# Patient Record
Sex: Female | Born: 1970 | Race: White | Hispanic: No | Smoking: Former smoker
Health system: Southern US, Community
[De-identification: ages and names within clinical notes are randomized; demographics above are authoritative.]

## PROBLEM LIST (undated history)

## (undated) DIAGNOSIS — M545 Low back pain, unspecified: Secondary | ICD-10-CM

## (undated) DIAGNOSIS — F410 Panic disorder [episodic paroxysmal anxiety] without agoraphobia: Secondary | ICD-10-CM

## (undated) DIAGNOSIS — R3589 Other polyuria: Secondary | ICD-10-CM

## (undated) DIAGNOSIS — R519 Headache, unspecified: Secondary | ICD-10-CM

## (undated) DIAGNOSIS — M25569 Pain in unspecified knee: Secondary | ICD-10-CM

## (undated) DIAGNOSIS — G4730 Sleep apnea, unspecified: Secondary | ICD-10-CM

## (undated) DIAGNOSIS — I10 Essential (primary) hypertension: Secondary | ICD-10-CM

## (undated) DIAGNOSIS — G8929 Other chronic pain: Secondary | ICD-10-CM

## (undated) DIAGNOSIS — R262 Difficulty in walking, not elsewhere classified: Secondary | ICD-10-CM

## (undated) DIAGNOSIS — R2689 Other abnormalities of gait and mobility: Secondary | ICD-10-CM

## (undated) DIAGNOSIS — K219 Gastro-esophageal reflux disease without esophagitis: Secondary | ICD-10-CM

## (undated) DIAGNOSIS — K5900 Constipation, unspecified: Secondary | ICD-10-CM

## (undated) DIAGNOSIS — R569 Unspecified convulsions: Secondary | ICD-10-CM

## (undated) DIAGNOSIS — F419 Anxiety disorder, unspecified: Secondary | ICD-10-CM

## (undated) DIAGNOSIS — R358 Other polyuria: Secondary | ICD-10-CM

## (undated) DIAGNOSIS — R109 Unspecified abdominal pain: Secondary | ICD-10-CM

## (undated) DIAGNOSIS — M1990 Unspecified osteoarthritis, unspecified site: Secondary | ICD-10-CM

## (undated) DIAGNOSIS — R51 Headache: Secondary | ICD-10-CM

## (undated) HISTORY — DX: Unspecified convulsions: R56.9

## (undated) HISTORY — PX: BACK SURGERY: SHX140

## (undated) HISTORY — DX: Anxiety disorder, unspecified: F41.9

## (undated) HISTORY — DX: Essential (primary) hypertension: I10

## (undated) HISTORY — DX: Panic disorder (episodic paroxysmal anxiety): F41.0

## (undated) HISTORY — DX: Other polyuria: R35.8

## (undated) HISTORY — DX: Sleep apnea, unspecified: G47.30

## (undated) HISTORY — DX: Other polyuria: R35.89

---

## 2000-09-25 ENCOUNTER — Emergency Department (HOSPITAL_COMMUNITY): Admission: EM | Admit: 2000-09-25 | Discharge: 2000-09-25 | Payer: Self-pay | Admitting: Emergency Medicine

## 2000-09-25 ENCOUNTER — Encounter: Payer: Self-pay | Admitting: Emergency Medicine

## 2000-11-09 ENCOUNTER — Emergency Department (HOSPITAL_COMMUNITY): Admission: EM | Admit: 2000-11-09 | Discharge: 2000-11-09 | Payer: Self-pay | Admitting: Internal Medicine

## 2000-11-09 ENCOUNTER — Encounter: Payer: Self-pay | Admitting: *Deleted

## 2000-11-11 ENCOUNTER — Encounter: Payer: Self-pay | Admitting: Internal Medicine

## 2000-11-11 ENCOUNTER — Emergency Department (HOSPITAL_COMMUNITY): Admission: EM | Admit: 2000-11-11 | Discharge: 2000-11-12 | Payer: Self-pay | Admitting: Internal Medicine

## 2002-01-23 ENCOUNTER — Emergency Department (HOSPITAL_COMMUNITY): Admission: EM | Admit: 2002-01-23 | Discharge: 2002-01-23 | Payer: Self-pay | Admitting: Emergency Medicine

## 2003-10-31 ENCOUNTER — Emergency Department (HOSPITAL_COMMUNITY): Admission: EM | Admit: 2003-10-31 | Discharge: 2003-10-31 | Payer: Self-pay | Admitting: Emergency Medicine

## 2003-11-22 ENCOUNTER — Emergency Department (HOSPITAL_COMMUNITY): Admission: EM | Admit: 2003-11-22 | Discharge: 2003-11-22 | Payer: Self-pay | Admitting: Emergency Medicine

## 2003-12-19 ENCOUNTER — Ambulatory Visit (HOSPITAL_COMMUNITY): Admission: RE | Admit: 2003-12-19 | Discharge: 2003-12-19 | Payer: Self-pay | Admitting: Pulmonary Disease

## 2004-11-24 ENCOUNTER — Emergency Department (HOSPITAL_COMMUNITY): Admission: EM | Admit: 2004-11-24 | Discharge: 2004-11-24 | Payer: Self-pay | Admitting: Emergency Medicine

## 2005-07-19 ENCOUNTER — Encounter (HOSPITAL_COMMUNITY): Admission: RE | Admit: 2005-07-19 | Discharge: 2005-08-18 | Payer: Self-pay | Admitting: Preventative Medicine

## 2005-12-26 ENCOUNTER — Emergency Department (HOSPITAL_COMMUNITY): Admission: EM | Admit: 2005-12-26 | Discharge: 2005-12-26 | Payer: Self-pay | Admitting: Emergency Medicine

## 2006-01-13 ENCOUNTER — Encounter: Admission: RE | Admit: 2006-01-13 | Discharge: 2006-01-13 | Payer: Self-pay | Admitting: Orthopedic Surgery

## 2006-04-19 ENCOUNTER — Encounter: Admission: RE | Admit: 2006-04-19 | Discharge: 2006-04-19 | Payer: Self-pay | Admitting: Orthopedic Surgery

## 2006-05-23 ENCOUNTER — Encounter (INDEPENDENT_AMBULATORY_CARE_PROVIDER_SITE_OTHER): Payer: Self-pay | Admitting: *Deleted

## 2006-05-23 ENCOUNTER — Ambulatory Visit: Payer: Self-pay | Admitting: *Deleted

## 2006-05-23 ENCOUNTER — Inpatient Hospital Stay (HOSPITAL_COMMUNITY): Admission: RE | Admit: 2006-05-23 | Discharge: 2006-05-26 | Payer: Self-pay | Admitting: Orthopedic Surgery

## 2006-06-02 ENCOUNTER — Inpatient Hospital Stay (HOSPITAL_COMMUNITY): Admission: EM | Admit: 2006-06-02 | Discharge: 2006-06-07 | Payer: Self-pay | Admitting: Emergency Medicine

## 2006-07-30 ENCOUNTER — Emergency Department (HOSPITAL_COMMUNITY): Admission: EM | Admit: 2006-07-30 | Discharge: 2006-07-30 | Payer: Self-pay | Admitting: Emergency Medicine

## 2006-08-22 ENCOUNTER — Encounter (HOSPITAL_COMMUNITY): Admission: RE | Admit: 2006-08-22 | Discharge: 2006-09-21 | Payer: Self-pay | Admitting: Orthopedic Surgery

## 2006-09-26 ENCOUNTER — Encounter (HOSPITAL_COMMUNITY): Admission: RE | Admit: 2006-09-26 | Discharge: 2006-10-26 | Payer: Self-pay | Admitting: Orthopedic Surgery

## 2007-02-02 ENCOUNTER — Other Ambulatory Visit: Admission: RE | Admit: 2007-02-02 | Discharge: 2007-02-02 | Payer: Self-pay | Admitting: Obstetrics and Gynecology

## 2007-11-08 ENCOUNTER — Emergency Department (HOSPITAL_COMMUNITY): Admission: EM | Admit: 2007-11-08 | Discharge: 2007-11-08 | Payer: Self-pay | Admitting: Emergency Medicine

## 2008-02-26 ENCOUNTER — Emergency Department (HOSPITAL_COMMUNITY): Admission: EM | Admit: 2008-02-26 | Discharge: 2008-02-26 | Payer: Self-pay | Admitting: Emergency Medicine

## 2008-06-18 ENCOUNTER — Emergency Department (HOSPITAL_COMMUNITY): Admission: EM | Admit: 2008-06-18 | Discharge: 2008-06-18 | Payer: Self-pay | Admitting: Emergency Medicine

## 2008-11-05 ENCOUNTER — Ambulatory Visit (HOSPITAL_COMMUNITY): Admission: RE | Admit: 2008-11-05 | Discharge: 2008-11-05 | Payer: Self-pay | Admitting: Pulmonary Disease

## 2009-02-16 ENCOUNTER — Emergency Department (HOSPITAL_COMMUNITY): Admission: EM | Admit: 2009-02-16 | Discharge: 2009-02-16 | Payer: Self-pay | Admitting: Emergency Medicine

## 2009-07-29 ENCOUNTER — Emergency Department (HOSPITAL_COMMUNITY): Admission: EM | Admit: 2009-07-29 | Discharge: 2009-07-29 | Payer: Self-pay | Admitting: Emergency Medicine

## 2009-11-19 ENCOUNTER — Ambulatory Visit (HOSPITAL_COMMUNITY): Admission: RE | Admit: 2009-11-19 | Discharge: 2009-11-19 | Payer: Self-pay | Admitting: Pulmonary Disease

## 2009-11-25 ENCOUNTER — Ambulatory Visit: Admission: RE | Admit: 2009-11-25 | Discharge: 2009-11-25 | Payer: Self-pay | Admitting: Pulmonary Disease

## 2010-04-25 ENCOUNTER — Encounter: Payer: Self-pay | Admitting: Orthopedic Surgery

## 2010-05-27 ENCOUNTER — Emergency Department (HOSPITAL_COMMUNITY)
Admission: EM | Admit: 2010-05-27 | Discharge: 2010-05-27 | Disposition: A | Discharge status: Home / Self Care | Payer: Medicare Other | Attending: Emergency Medicine | Admitting: Emergency Medicine

## 2010-05-27 ENCOUNTER — Emergency Department (HOSPITAL_COMMUNITY): Payer: Medicare Other

## 2010-05-27 DIAGNOSIS — J40 Bronchitis, not specified as acute or chronic: Secondary | ICD-10-CM | POA: Insufficient documentation

## 2010-05-27 DIAGNOSIS — R059 Cough, unspecified: Secondary | ICD-10-CM | POA: Insufficient documentation

## 2010-05-27 DIAGNOSIS — R05 Cough: Secondary | ICD-10-CM | POA: Insufficient documentation

## 2010-05-27 DIAGNOSIS — M549 Dorsalgia, unspecified: Secondary | ICD-10-CM | POA: Insufficient documentation

## 2010-06-22 LAB — URINALYSIS, ROUTINE W REFLEX MICROSCOPIC
Bilirubin Urine: NEGATIVE
Glucose, UA: NEGATIVE mg/dL
Hgb urine dipstick: NEGATIVE
Protein, ur: NEGATIVE mg/dL
Specific Gravity, Urine: 1.02 (ref 1.005–1.030)
Urobilinogen, UA: 0.2 mg/dL (ref 0.0–1.0)

## 2010-06-22 LAB — DIFFERENTIAL
Basophils Absolute: 0 10*3/uL (ref 0.0–0.1)
Lymphocytes Relative: 18 % (ref 12–46)
Lymphs Abs: 1.8 10*3/uL (ref 0.7–4.0)
Neutro Abs: 7.4 10*3/uL (ref 1.7–7.7)
Neutrophils Relative %: 75 % (ref 43–77)

## 2010-06-22 LAB — COMPREHENSIVE METABOLIC PANEL WITH GFR
ALT: 23 U/L (ref 0–35)
AST: 21 U/L (ref 0–37)
Albumin: 3.8 g/dL (ref 3.5–5.2)
Alkaline Phosphatase: 67 U/L (ref 39–117)
BUN: 8 mg/dL (ref 6–23)
CO2: 23 meq/L (ref 19–32)
Calcium: 9.4 mg/dL (ref 8.4–10.5)
Chloride: 105 meq/L (ref 96–112)
Creatinine, Ser: 0.68 mg/dL (ref 0.4–1.2)
GFR calc Af Amer: >60 mL/min (ref >60)
GFR calc non Af Amer: >60 mL/min (ref >60)
Glucose, Bld: 98 mg/dL (ref 70–99)
Potassium: 3.4 meq/L — ABNORMAL LOW (ref 3.5–5.1)
Sodium: 136 meq/L (ref 135–145)
Total Bilirubin: 0.3 mg/dL (ref 0.3–1.2)
Total Protein: 7.2 g/dL (ref 6.0–8.3)

## 2010-06-22 LAB — CBC
HCT: 35.7 % — ABNORMAL LOW (ref 36.0–46.0)
Hemoglobin: 13.2 g/dL (ref 12.0–15.0)
MCHC: 37 g/dL — ABNORMAL HIGH (ref 30.0–36.0)
MCV: 88 fL (ref 78.0–100.0)
Platelets: 217 K/uL (ref 150–400)
RBC: 4.05 MIL/uL (ref 3.87–5.11)
RDW: 12.5 % (ref 11.5–15.5)
WBC: 9.9 K/uL (ref 4.0–10.5)

## 2010-06-22 LAB — LIPASE, BLOOD: Lipase: 33 U/L (ref 11–59)

## 2010-07-07 LAB — DIFFERENTIAL
Basophils Absolute: 0 10*3/uL (ref 0.0–0.1)
Basophils Relative: 1 % (ref 0–1)
Eosinophils Absolute: 0.1 10*3/uL (ref 0.0–0.7)
Eosinophils Relative: 1 % (ref 0–5)
Lymphocytes Relative: 32 % (ref 12–46)
Lymphs Abs: 1.8 10*3/uL (ref 0.7–4.0)
Monocytes Absolute: 0.4 10*3/uL (ref 0.1–1.0)
Monocytes Relative: 6 % (ref 3–12)
Neutro Abs: 3.4 10*3/uL (ref 1.7–7.7)
Neutrophils Relative %: 60 % (ref 43–77)

## 2010-07-07 LAB — CK TOTAL AND CKMB (NOT AT ARMC)
CK, MB: 0.8 ng/mL (ref 0.3–4.0)
Relative Index: INVALID (ref 0.0–2.5)
Total CK: 42 U/L (ref 7–177)

## 2010-07-07 LAB — COMPREHENSIVE METABOLIC PANEL
AST: 19 U/L (ref 0–37)
Albumin: 3.9 g/dL (ref 3.5–5.2)
Alkaline Phosphatase: 76 U/L (ref 39–117)
BUN: 9 mg/dL (ref 6–23)
Chloride: 101 mEq/L (ref 96–112)
GFR calc Af Amer: >60 mL/min (ref >60)
Potassium: 4.1 mEq/L (ref 3.5–5.1)
Sodium: 138 mEq/L (ref 135–145)
Total Protein: 7.8 g/dL (ref 6.0–8.3)

## 2010-07-07 LAB — CBC
HCT: 42.2 % (ref 36.0–46.0)
Hemoglobin: 14.9 g/dL (ref 12.0–15.0)
RBC: 4.74 MIL/uL (ref 3.87–5.11)
RDW: 12.9 % (ref 11.5–15.5)
WBC: 5.7 10*3/uL (ref 4.0–10.5)

## 2010-08-20 NOTE — Consult Note (Signed)
[name], [name]            ACCOUNT NO.:  [id number]   MEDICAL RECORD NO.:  [id number]          PATIENT TYPE:  INP   LOCATION:  5014                         FACILITY:  [facility]   PHYSICIAN:  Courtney Paris, M.D.DATE OF BIRTH:  March 06, 1971   DATE OF CONSULTATION:  06/02/2006  DATE OF DISCHARGE:                                 CONSULTATION   REQUESTING PHYSICIAN:  Dr. Liliane Barr   REASON FOR CONSULTATION:  Large left renal urinoma.   BRIEF HISTORY:  This 40 year old single white female had an artificial  disc placed about ten days ago by Dr. Alveda Reasons apparently without  complication, this was for a radicular pain down her right leg.  Surgery  went well, she went home on or about the third postoperative day.  Beginning about three days ago, she started having some increasing pain  in her left side radiating down her left leg and her abdomen became more  and more distended.  She saw Dr. Alveda Reasons yesterday in the office and he  admitted her to the hospital for further treatment and care.  A CT scan  done today showed a large left urinoma of the left collecting system,  there was very minimal hydronephrosis, it looks like a ureteral leak  near the ureteral pelvic junction that was tracking down the  retroperitoneum.  There was a massive amount of fluid in the  retroperitoneum, but the kidney itself was very minimally if any  dilated.  This was about L2 where the disc was two levels lower.   She had never had a previous urinary problem before, she does have  urinary frequency, has had very rare urinary infections in the past.  She did have a little bit of blood in her urine when she left the  hospital last week and is on Coumadin.  Her other medicines include  Darvocet for pain, Dilantin for seizures 200 mg b.i.d. and Phenergan for  nausea.  She has not had any fever, chills, but has had some mild  nausea, but no vomiting.   PAST MEDICAL HISTORY:  She has had seizures since she was  little, but on  the Dilantin has done well, never had kidney stones or urinary problems,  does not have stress incontinence.  She has been voiding large amounts  of clear urine.  The 12-point review is otherwise negative, she does not  smoke or drink alcohol, no cardiac or pulmonary symptomatology, bowels  have been somewhat constipated.   FAMILY HISTORY/SOCIAL HISTORY/REVIEW OF SYSTEMS:  Negative except as  noted.  She lives with her significant other in [place], her father  lives in [place], has had throat cancer, but is doing well, her  mother lives in [place], she has one other sister in good health.  No  other family history of renal disease, kidney stones or hematuria.   VITAL SIGNS:  Blood pressure is 138/88, pulse 110, respirations 16,  temperature 98.2.  GENERAL:  She is a pleasant, short-haired white female in no acute  distress.  She works for [employer].  HEENT:  Clear.  NECK:  Supple. CHEST: Clear, no  mumurs  ABDOMEN:  Soft with a small transverse incision in her left lower  quadrant.  She does have some abdominal distension and some mild left  CVA tenderness.  PELVIC:  Deferred.  EXTREMITIES:  Negative, no edema, good distal pulses, intact sensation  to light touch.  NEURO: Intact CNS, Oriented x 3  Her laboratory values were all normal.   IMPRESSION:  1. Large left urinoma.  2. Left upper ureteral injury.  3. Anticoagulation on Coumadin with history of hematuria.  4. Recent artificial disc procedure.   RECOMMENDATIONS:  Will hold her Coumadin that she normally would get  tonight, her PT levels apparently are low.  I will try to do a cysto and  retrograde polygram and determine the point of injury and try to place a  double-J stent tomorrow under anesthesia.  If her PT is still a little  prolonged, we may have to wait a day or two, but then have radiology  place a percutaneous drain into the urinoma to help this to resolve  quicker.       Courtney Paris, M.D.  Electronically Signed     HMK/MEDQ  D:  06/02/2006  T:  06/03/2006  Job:  [id number]

## 2010-08-20 NOTE — Op Note (Signed)
NAMESYNIAH, BERNE NO.:  [id number]   MEDICAL RECORD NO.:  [id number]          PATIENT TYPE:  INP   LOCATION:  2550                         FACILITY:  [facility]   PHYSICIAN:  Nelda Severe, MD      DATE OF BIRTH:  12-Jan-1971   DATE OF PROCEDURE:  05/23/2006  DATE OF DISCHARGE:                               OPERATIVE REPORT   COSURGEONS:  Nelda Severe, MD and Balinda Quails, M.D.   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   OPERATIVE PROCEDURE:  Total disk arthroplasty (Pro disk L4-5)   OPERATIVE NOTE:  The patient was placed under general endotracheal  anesthesia.  A Foley catheter was placed in the bladder.  A gram of  vancomycin was infused.  Bilateral sequential compression devices were  placed on the lower extremities.  A pulse oximeter was placed on the  left great toe.  A central line had previously placed through the  jugular vein.   The patient was positioned supine on Gratz table with the hips and  knees gently flexed and the arms abducted.   Dr. Liliane Bade performed the anterior exposure via the Brau technique.  Once retractors were placed at L4-5, I proceeded with the disk  replacement.   The anterior annulus was incised in rectangular fashion and then  elevators used to separate the nucleus and outer annular fibers from the  endplate above and below.  Most the disk was delivered using a Leksell  rongeur.   We then curetted posteriorly and laterally removing annulus from the  endplate.  We curetted back to the very most posterior annular fibers  which were eventually released from the L4 vertebra above.  We did not  violate the posterior longitudinal ligament.   The medium footprint 5 degrees 10 mm spacer was placed.  Radiographs  were taken.  It was slightly off center.  It was withdrawn.  More  posterior  release was carried out.  The trial was then placed again and  looked good on both AP and lateral views.   The corresponding size of disk replacement was then chosen.   The special osteotome for cutting the channels in the vertebra above and  below for the keel of the implant was then fitted over the trial block  and the channels cut under fluoroscopic guidance.  The osteotome and the  cutting block were removed.   The implant was then mounted on a special inserter and impacted into  place gently.  This was also done under lateral fluoroscopic guidance  and it was positioned as far posteriorly as we felt possible, lining up  the posterior aspect of the implant with the posterior aspect of the L5  vertebra.   The poly insert was then inserted and checked doubly to make sure it was  flush with the anterior aspect of the inferior plate.   The inserting instrumentation was then removed.  AP and lateral fluoro  fluoroscopic views were taken and recorded showing satisfactory  position  of the implant.   Retractors were then removed and Dr. Madilyn Fireman effected the closure of the  wound.   There no intraoperative complications.  The patient was stable  throughout the procedure.      Nelda Severe, MD  Electronically Signed     MT/MEDQ  D:  05/23/2006  T:  05/23/2006  Job:  [id number]

## 2010-08-20 NOTE — H&P (Signed)
[name], [name] NO.:  [id number]   MEDICAL RECORD NO.:  [id number]          PATIENT TYPE:  INP   LOCATION:  5014                         FACILITY:  [facility]   PHYSICIAN:  Nelda Severe, MD      DATE OF BIRTH:  08/31/1970   DATE OF ADMISSION:  06/02/2006  DATE OF DISCHARGE:                              HISTORY & PHYSICAL   CHIEF COMPLAINT:  Abdominal/back pain on the left lower quadrant.  She  is 10 days status post anterior lumbar disc replacement Pro disc.   PAST MEDICAL HISTORY:  Includes seizures.   FAMILY HISTORY:  Includes father cancer, grandmother cancer.   SOCIAL HISTORY:  She does not smoke or drink.  She lives with  significant other.   DRUG ALLERGY:  INCLUDES BENADRYL.   MEDICATIONS:  Include:  1. Darvocet-N 100 one q.4-6 p.r.n. for pain.  2. Phenytek 200 mg twice a day a.m. and p.m.  3. Phenergan 25 mg suppository p.r.n. for nausea.   REVIEW OF SYSTEMS:  She reports no fever, no chills.  Cognitive vomiting  and nausea.  No bleeding tendencies.  No bowel or bladder incontinence.  She has been constipated recently in the past 24 hours.  No hemoptysis.  No shortness of breath.  No chest pain.   PHYSICAL EXAMINATION:  GENERAL:  She is in obvious pain.  She is moving  constantly, holding her left side with her hand.  VITAL SIGNS:  Temperature is 98.2.  Pulse is 110.  Respirations 16.  Blood pressure 138/88.  HEENT:  Normocephalic.  NECK:  Active range of motion.  Intact.  CHEST:  Clear to auscultation bilaterally.  No wheezes noted.  HEART:  Regular rate and rhythm.  No murmurs noted.  ABDOMEN:  I did have positive bowel sounds.  She does have firmness to  her abdomen.  No rebound tenderness, but it is tender to palpation.  EXTREMITIES:  Did show a neurovascular motor intact.  She does state she  has leg pain that radiates down her left leg when she stands.  SKIN:  Clean, dry and intact.  Abdominal incision is well healed.   X-rays are  pending on both the abdomen, as well as lumbar spine.   Diagnosis L4-5 disc degeneration, protrusion   IMPRESSION:  Admit for further delineation of pathology of pain on the  left lower quadrant.      Lianne Cure, P.A.      Nelda Severe, MD  Electronically Signed    MC/MEDQ  D:  06/02/2006  T:  06/03/2006  Job:  [id number]

## 2010-08-20 NOTE — Discharge Summary (Signed)
[name], RUARK NO.:  [id number]   MEDICAL RECORD NO.:  [id number]          PATIENT TYPE:  INP   LOCATION:  5036                         FACILITY:  [facility]   PHYSICIAN:  Nelda Severe, MD      DATE OF BIRTH:  Apr 16, 1970   DATE OF ADMISSION:  05/23/2006  DATE OF DISCHARGE:  05/26/2006                               DISCHARGE SUMMARY   This woman was admitted for management of severe axial back pain  associated with an annular tear in the L4-5 disk.  The day of admission,  she was taken to the operating room where a total disk replacement was  performed at L4-5, Dr. Madilyn Fireman, Co-Surgeon.   Postoperatively, she has really had no complications referable to her  back surgery, her main problem has been that of coughing.  This is  presumably due to irritation from the endotracheal tube.  She did have a  recent upper respiratory infection.  This has not been responsive to the  narcotics that she is taking for pain nor to dextromethorphan.   She has been on prophylactic anticoagulation with Coumadin, and her INR  today is 2.9.   At the present time, she can sit with comfort.  She can ambulate  independently.  Her ileus is resolved.  She is passing flatus, but she  has not had a bowel movement.  She has had a positive urinalysis with  blood cells and is going to be given 200 mg of Cipro IV prior to her  discharge and has been given a prescription for 100 mg Cipro by mouth  b.i.d. for the next three days.  She is also being given a prescription  for Norco 10 one q.4-6h. p.r.n. 100 tablets which can be refilled twice.  She is also being given a prescription for Coumadin 1 mg 100 tablets,  dose to be adjusted on the basis of a biweekly PT/INR to be done by a  home health nurse.  We will phone her with the recommended dose.   She has also been given a prescription for Phenergan and codeine elixir  1 teaspoon q.4h. p.r.n. 100 mL which can be repeated x1.   She has been  instructed to avoid bending and lifting.  She may shower.  She may remove her Steri-Strips at any time that she wants to versus  just letting them fall off.  She is to call me for any problems,  particularly wound drainage or persistent fever.   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   CONDITION ON DISCHARGE:  Ambulatory, afebrile, pain controlled with oral  analgesics.   It should be noted that her preoperative pain, for which the operation  was done, has significantly improved subsequent to the surgery.     Nelda Severe, MD  Electronically Signed    MT/MEDQ  D:  05/26/2006  T:  05/27/2006  Job:  [id number]

## 2010-08-20 NOTE — Discharge Summary (Signed)
[name], [name] NO.:  [id number]   MEDICAL RECORD NO.:  [id number]          PATIENT TYPE:  INP   LOCATION:  5014                         FACILITY:  [facility]   PHYSICIAN:  Nelda Severe, MD      DATE OF BIRTH:  10/06/1970   DATE OF ADMISSION:  06/02/2006  DATE OF DISCHARGE:  06/07/2006                               DISCHARGE SUMMARY   [name] was admitted by Nelda Severe, MD, secondary to abdominal  pain/lumbar pain, left lower quadrant.  [name] was 10 days status post  anterior lumbar disk replacement.  In the emergency room [name] had extreme  difficulty being still.  We ordered lumbar x-rays.  They were negative  for any obvious displacement of hardware.  They were in comparison with  previous x-ray done intraoperatively, shows no change in hardware.  A CT  of the abdomen was ordered.  We consulted Dr. Madilyn Fireman, who helped [place] on  our surgery.  He is a Physiological scientist.  Eventually Courtney Paris, M.D., was consulted secondary to left ureteral injury.  A CT  of the abdomen that was done on February 29 reported concluded  impression:  Left urinoma.  Pelvis CT contrast showed small amounts of  intraperitoneal free fluid, no evidence of adnexal mass.  Appendix was  not visualized.  A CT pelvis was also done on February 29.  Impression:  Probable left urinoma.  A retrograde pyelogram was then done,  demonstrates filling of the ureter, left renal pelvis, no obvious  extravasation of contrast to suggest ureteral injury.  Impression:  Retrograde pyelogram as described.  [name] was taken to the OR by  Dr. Vic Blackbird on June 03, 2006.  Operation consisted of  cystoscopy, retrograde pyelogram left, and insertion of left ureteral  stent.  There was a drain placed in the abdominal wall.  This was left  until March 5.  It was then removed by Dr. Aldean Ast.  The patient was  sent home.  During her stay [name] was afebrile.  [name] had no abnormal labs.   [name] was sent home  on her:  1. Phenergan 25 mg suppository p.r.n. for nausea.  2. Darvocet-N 100 one q.4h. p.r.n. for pain control.   A regular diet.  [name] may shower.  Ambulation as tolerated.  [name] is going  to make an appointment to follow up with Dr. Aldean Ast and [name] is going  to follow up with Dr. Nelda Severe at her regularly scheduled  appointment 4 weeks from June 02, 2006, her lumbar surgery.  [name] is  going to continue all her home medications including the ones stated  above as well as Phenytek 200 mg twice daily for seizure control.      Lianne Cure, P.A.      Nelda Severe, MD  Electronically Signed    MC/MEDQ  D:  07/03/2006  T:  07/03/2006  Job:  [id number]

## 2010-08-20 NOTE — Op Note (Signed)
[name], SIGMAN            ACCOUNT NO.:  [id number]   MEDICAL RECORD NO.:  [id number]          PATIENT TYPE:  INP   LOCATION:  5014                         FACILITY:  [facility]   PHYSICIAN:  Courtney Paris, M.D.DATE OF BIRTH:  1970-08-22   DATE OF PROCEDURE:  06/03/2006  DATE OF DISCHARGE:                               OPERATIVE REPORT   PREOPERATIVE DIAGNOSES:  1. Left ureteral injury.  2. Left urinoma.   POSTOPERATIVE DIAGNOSES:  1. Left ureteral injury.  2. Left urinoma.   OPERATION:  1. Cystoscopy.  2. Retrograde pyelogram, left.  3. Insertion of left ureteral stent.   ANESTHESIA:  General.   SURGEON:  Courtney Paris, M.D.   BRIEF HISTORY:  This 40 year old patient had an artificial disk  implanted 11 days ago.  She went home after 3 days but developed some  left flank pain and was found on CT scan yesterday to have a large  urinoma of the left retroperitoneum and it look like a leak of the upper  ureter near the UPJ was noted on CT.  She is admitted now for cystoscopy  and stent placement.  She has been on Coumadin but her INR was 1.4 this  morning.   The patient was placed on the operating table in the dorsal lithotomy  position, after satisfactory induction of general anesthesia was prepped  and draped with Betadine and given IV Cipro.  A #22 panendoscope was  inserted.  The bladder was normal.  The orifices appeared normal and the  left orifice was catheterized with a #5 open-ended ureteral catheter.   An occlusive retrograde demonstrated a normal-appearing ureter all the  way up to and including the kidney.  There was a little mild  hydronephrosis with blunting of the calyces but no definite leak was  seen.   Under fluoroscopy through the open-ended catheter, a guidewire was then  passed up into the renal pelvis without difficulty.  The open-ended  catheter was then removed.  The wire was then loaded over the over the  guidewire.  A 6-French x  24 cm length double-J ureteral stent was then  passed and the guidewire was removed.  It seemed like there was not in a  coil in the renal pelvis and as the as stent was pulled back slightly it  seemed like it was not draining, so I pulled it outside the urethral  meatus, passed the guidewire again, advanced the catheter back up into  the renal pelvis, and this time when I removed the guidewire there  seemed to be an adequate length in the renal pelvis.  It  seemed to adequately drain.  The bladder was drained, scope removed.  The patient taken to the recovery room in good condition to be later  taken back to the floor.  We will attempt to have interventional  radiology place a percutaneous drain in the large urinoma later today  under ultrasound guidance.      Courtney Paris, M.D.  Electronically Signed     HMK/MEDQ  D:  06/03/2006  T:  06/03/2006  Job:  [id number]

## 2010-08-20 NOTE — Op Note (Signed)
NAMEWIKTORIA, HEMRICK            ACCOUNT NO.:  [id number]   MEDICAL RECORD NO.:  [id number]          PATIENT TYPE:  INP   LOCATION:  5036                         FACILITY:  [facility]   PHYSICIAN:  Balinda Quails, M.D.    DATE OF BIRTH:  [birth year]   DATE OF PROCEDURE:  05/23/2006  DATE OF DISCHARGE:                               OPERATIVE REPORT   SURGEON:  P Bud Face, MD   COSURGEON:  Charlsie Quest, MD.   ANESTHETIC:  General endotracheal.   PREOPERATIVE DIAGNOSIS:  L4-5 degenerative disk disease.   POSTOPERATIVE DIAGNOSIS:  L4-5 degenerative disk disease.   PROCEDURE:  1. Left retroperitoneal anterior L4-5 spine access.  2. L4-5 total disk arthroplasty (Prodisc)   CLINICAL NOTE:  Criss Bartles is a 40 year old female with history of  chronic back pain.  Workup for this revealed L4-5 degenerative disk  disease, she is brought to the operating at this time for planned L4-5  anterior lumbar total disk arthroplasty (Prodisc).   The patient was evaluated preoperatively and discussion carried out  regarding potential risks of anterior L4-5 spine access.  These risks  include but are not limited to hernia, ureter injury, major vessel  injury, bleeding, infection, transfusion.   OPERATIVE PROCEDURE:  The patient brought to the operating room in  stable hemodynamic condition.  Placed under general endotracheal  anesthesia in the supine position.  Central venous catheter in place.   The abdomen was prepped and draped in sterile fashion in supine  position.   Transverse skin incision made at the premarked site in the left lower  quadrant at the level the umbilicus.  Incision extended deeply through  subcutaneous tissue.  Anterior rectus sheath was exposed.  The anterior  rectus sheath incised from the midline to the lateral margin of the  rectus muscle.  Superior and inferior anterior rectus sheath flaps were  created.  To fingers then passed behind the muscle, muscle  mobilized  bluntly.   The muscle then reflected medially and the posterior rectus sheath was  incised with a knife and the peritoneum was freed off of the posterior  rectus sheath.  The poster rectus sheath incised.   The retroperitoneal plane was then entered laterally.  The peritoneal  contents rotated anteriorly.  The ureter and sympathetic nerve fibers  were then mobilized off of the left common iliac artery.  The left  common and external iliac artery were fully mobilized laterally and  reflected medially.  The L4-5 disk was identified and the iliolumbar  vein inferior to this was ligated with 2-0 silk and divided.  The left  common iliac vein was then freed and retracted to the right.  The L4-5  disk was identified and the disk cleared from left-to-right.   The radiolucent reverse lip blades were then placed on the lateral  aspects of the L4-5 vertebrae.  Superior and inferior malleable  retractors placed to expose the L4-5 disk.   Dr. Alveda Reasons then completed excision of disk with placement of a Prodisc.   At termination of this, the retractors were removed.  There was no  significant  bleeding.  Pulse oximeter returned to normal preoperative  values left foot.   The anterior rectus sheath was then closed with running 0-0 Vicryl  suture.  The subcutaneous tissue closed running 2-0 Vicryl suture.  Skin  closed with 4-0 Vicryl.  Steri-Strips applied along with sterile  dressing.   There were no complications evident.  The patient transferred to the  recovery room in stable condition.  2+ left dorsalis pedis pulse present  at termination of the procedure.      Balinda Quails, M.D.  Electronically Signed     PGH/MEDQ  D:  05/23/2006  T:  05/24/2006  Job:  [id number]   cc:   Nelda Severe, MD

## 2010-12-31 LAB — CBC
Platelets: 253
WBC: 7.1

## 2010-12-31 LAB — WET PREP, GENITAL
Trich, Wet Prep: NONE SEEN
Yeast Wet Prep HPF POC: NONE SEEN

## 2010-12-31 LAB — URINALYSIS, ROUTINE W REFLEX MICROSCOPIC
Glucose, UA: NEGATIVE
Leukocytes, UA: NEGATIVE
pH: 7.5

## 2010-12-31 LAB — BASIC METABOLIC PANEL
BUN: 7
Calcium: 9.4
Creatinine, Ser: 0.94
GFR calc non Af Amer: >60

## 2010-12-31 LAB — URINE MICROSCOPIC-ADD ON

## 2010-12-31 LAB — DIFFERENTIAL
Eosinophils Absolute: 0.1
Lymphocytes Relative: 25
Lymphs Abs: 1.8
Neutro Abs: 4.5
Neutrophils Relative %: 64

## 2010-12-31 LAB — GC/CHLAMYDIA PROBE AMP, GENITAL: GC Probe Amp, Genital: NEGATIVE

## 2010-12-31 LAB — PHENYTOIN LEVEL, TOTAL: Phenytoin Lvl: 11

## 2011-01-04 LAB — DIFFERENTIAL
Basophils Relative: 0
Eosinophils Absolute: 0
Monocytes Absolute: 0.7
Monocytes Relative: 11
Neutrophils Relative %: 78 — ABNORMAL HIGH

## 2011-01-04 LAB — COMPREHENSIVE METABOLIC PANEL
Alkaline Phosphatase: 69
BUN: 7
CO2: 26
Chloride: 104
GFR calc non Af Amer: >60
Glucose, Bld: 128 — ABNORMAL HIGH
Potassium: 3.2 — ABNORMAL LOW
Total Bilirubin: 0.7

## 2011-01-04 LAB — URINALYSIS, ROUTINE W REFLEX MICROSCOPIC
Glucose, UA: NEGATIVE
Protein, ur: NEGATIVE

## 2011-01-04 LAB — URINE MICROSCOPIC-ADD ON

## 2011-01-04 LAB — CBC
HCT: 39.5
Hemoglobin: 13.9
RDW: 12.6

## 2011-01-12 ENCOUNTER — Ambulatory Visit (HOSPITAL_COMMUNITY)
Admission: RE | Admit: 2011-01-12 | Discharge: 2011-01-12 | Disposition: A | Discharge status: Home / Self Care | Payer: PRIVATE HEALTH INSURANCE | Source: Ambulatory Visit | Attending: Pulmonary Disease | Admitting: Pulmonary Disease

## 2011-01-12 ENCOUNTER — Other Ambulatory Visit (HOSPITAL_COMMUNITY): Payer: Self-pay | Admitting: Pulmonary Disease

## 2011-01-12 DIAGNOSIS — M549 Dorsalgia, unspecified: Secondary | ICD-10-CM

## 2011-01-12 DIAGNOSIS — M545 Low back pain, unspecified: Secondary | ICD-10-CM | POA: Insufficient documentation

## 2011-01-12 DIAGNOSIS — M546 Pain in thoracic spine: Secondary | ICD-10-CM | POA: Insufficient documentation

## 2011-02-22 ENCOUNTER — Other Ambulatory Visit: Payer: Self-pay | Admitting: Adult Health

## 2011-03-22 ENCOUNTER — Other Ambulatory Visit: Payer: Self-pay | Admitting: Neurology

## 2011-03-22 DIAGNOSIS — R4182 Altered mental status, unspecified: Secondary | ICD-10-CM

## 2011-03-31 ENCOUNTER — Ambulatory Visit (HOSPITAL_COMMUNITY)
Admission: RE | Admit: 2011-03-31 | Discharge: 2011-03-31 | Disposition: A | Discharge status: Home / Self Care | Payer: PRIVATE HEALTH INSURANCE | Source: Ambulatory Visit | Attending: Neurology | Admitting: Neurology

## 2011-03-31 DIAGNOSIS — R4182 Altered mental status, unspecified: Secondary | ICD-10-CM | POA: Insufficient documentation

## 2011-03-31 DIAGNOSIS — R413 Other amnesia: Secondary | ICD-10-CM | POA: Insufficient documentation

## 2011-03-31 DIAGNOSIS — R51 Headache: Secondary | ICD-10-CM | POA: Insufficient documentation

## 2011-12-13 ENCOUNTER — Ambulatory Visit (HOSPITAL_COMMUNITY)
Admission: RE | Admit: 2011-12-13 | Discharge: 2011-12-13 | Disposition: A | Discharge status: Home / Self Care | Payer: PRIVATE HEALTH INSURANCE | Source: Ambulatory Visit | Attending: Pulmonary Disease | Admitting: Pulmonary Disease

## 2011-12-13 ENCOUNTER — Other Ambulatory Visit (HOSPITAL_COMMUNITY): Payer: Self-pay | Admitting: Pulmonary Disease

## 2011-12-13 DIAGNOSIS — M79671 Pain in right foot: Secondary | ICD-10-CM

## 2011-12-13 DIAGNOSIS — M25579 Pain in unspecified ankle and joints of unspecified foot: Secondary | ICD-10-CM | POA: Insufficient documentation

## 2012-01-10 ENCOUNTER — Ambulatory Visit: Payer: PRIVATE HEALTH INSURANCE | Admitting: Orthopedic Surgery

## 2012-01-17 ENCOUNTER — Encounter: Payer: Self-pay | Admitting: Orthopedic Surgery

## 2012-01-17 ENCOUNTER — Ambulatory Visit (INDEPENDENT_AMBULATORY_CARE_PROVIDER_SITE_OTHER): Payer: PRIVATE HEALTH INSURANCE | Admitting: Orthopedic Surgery

## 2012-01-17 ENCOUNTER — Ambulatory Visit (INDEPENDENT_AMBULATORY_CARE_PROVIDER_SITE_OTHER): Payer: PRIVATE HEALTH INSURANCE

## 2012-01-17 VITALS — BP 120/68 | Ht 67.0 in | Wt 178.0 lb

## 2012-01-17 DIAGNOSIS — S93409A Sprain of unspecified ligament of unspecified ankle, initial encounter: Secondary | ICD-10-CM | POA: Insufficient documentation

## 2012-01-17 DIAGNOSIS — M25579 Pain in unspecified ankle and joints of unspecified foot: Secondary | ICD-10-CM

## 2012-01-17 DIAGNOSIS — M25571 Pain in right ankle and joints of right foot: Secondary | ICD-10-CM

## 2012-01-17 NOTE — Patient Instructions (Addendum)
Ankle sprain  You will need physical therapy and bracing for the next 6 weeks  Please call the hospital or facility of your choice to arrange physical therapy appointment. We will send orders to that facility for you.

## 2012-01-17 NOTE — Progress Notes (Signed)
Patient ID: [name], female   DOB: [birth year], 42 y.o.   MRN: [id number] Chief Complaint  Patient presents with  . Ankle Pain    Right ankle pain, fell and twisted 5 months ago     The patient fell about in May of 2013 twisting her right ankle with an inversion injury. She had a significant amount of swelling and she limped when she was walking.  She presents now after several weeks of continued pain in the lateral aspect of the ankle and around the Achilles and also in the ankle joint.  She complains of sharp dull burning 10 out of 10 pain which is worse with walking. She has continued swelling catching. She complains of heartburn and nausea with constipation frequency urgency seizure disorder nervousness anxiety and excessive urination.  Past Medical History  Diagnosis Date  . Seizures   . HTN (hypertension)   . Panic attacks   . Anxiety   . Polyuria     Past Surgical History  Procedure Date  . Back surgery    BP 120/68  Ht 5\' 7"  (1.702 m)  Wt 178 lb (80.74 kg)  BMI 27.88 kg/m2  LMP 01/17/2012 General appearance is normal She is oriented x3 Mood and affect are normal Ambulation is marked by a mild limp with decreased range of motion in the ankle. She is tender over the fibula anterior talofibular ligament with decreased range of motion especially dorsiflexion. She has a grade 1 anterior drawer test of the ankle skin is intact temperature is normal in the foot and ankle pulses good sensation is intact Babinski's is negative.  X-ray of the foot was normal  We x-rayed the ankle that was normal  Chronic ankle sprain  Recommend ASO bracing  Recommend physical therapy for 6 weeks  No followup necessary

## 2012-01-25 ENCOUNTER — Ambulatory Visit (HOSPITAL_COMMUNITY)
Admission: RE | Admit: 2012-01-25 | Discharge: 2012-01-25 | Disposition: A | Discharge status: Home / Self Care | Payer: PRIVATE HEALTH INSURANCE | Source: Ambulatory Visit | Attending: Orthopedic Surgery | Admitting: Orthopedic Surgery

## 2012-01-25 DIAGNOSIS — R262 Difficulty in walking, not elsewhere classified: Secondary | ICD-10-CM | POA: Insufficient documentation

## 2012-01-25 DIAGNOSIS — M25673 Stiffness of unspecified ankle, not elsewhere classified: Secondary | ICD-10-CM | POA: Insufficient documentation

## 2012-01-25 DIAGNOSIS — R269 Unspecified abnormalities of gait and mobility: Secondary | ICD-10-CM | POA: Insufficient documentation

## 2012-01-25 DIAGNOSIS — R2689 Other abnormalities of gait and mobility: Secondary | ICD-10-CM | POA: Insufficient documentation

## 2012-01-25 DIAGNOSIS — M25579 Pain in unspecified ankle and joints of unspecified foot: Secondary | ICD-10-CM | POA: Insufficient documentation

## 2012-01-25 DIAGNOSIS — M25676 Stiffness of unspecified foot, not elsewhere classified: Secondary | ICD-10-CM | POA: Insufficient documentation

## 2012-01-25 DIAGNOSIS — IMO0001 Reserved for inherently not codable concepts without codable children: Secondary | ICD-10-CM | POA: Insufficient documentation

## 2012-01-25 DIAGNOSIS — M6281 Muscle weakness (generalized): Secondary | ICD-10-CM | POA: Insufficient documentation

## 2012-01-25 NOTE — Evaluation (Signed)
Physical Therapy Evaluation  Patient Details  Name: BRITNEE MCDEVITT MRN: [id number] Date of Birth: 01-29-1971  Today's Date: 01/25/2012 Time: 1191-4782 PT Time Calculation (min): 41 min  Visit#: 1  of 12   Re-eval: 02/24/12 Assessment Diagnosis: ankle sprain Next MD Visit: 11/30 Prior Therapy: none  Authorization: medicare     Authorization Visit#: 1  of 10    Past Medical History:  Past Medical History  Diagnosis Date  . Seizures   . HTN (hypertension)   . Panic attacks   . Anxiety   . Polyuria    Past Surgical History:  Past Surgical History  Procedure Date  . Back surgery     Subjective Symptoms/Limitations Symptoms: Pt states that she twisted her ankle while she was walking the dog and twisted her ankle on an uneven surface 6 months ago.  The patient states that she has increased pain after she has been on her leg for a while. How long can you sit comfortably?: no problem How long can you stand comfortably?: 15-20 minutes How long can you walk comfortably?: 15-20 minutes Patient Stated Goals: less pain  Pain Assessment Currently in Pain?: Yes Pain Score:   2 (Worst pain in the past 2 weeks 9/10; lowest 2/10) Pain Location: Ankle Pain Orientation: Right Pain Type: Chronic pain Pain Onset: More than a month ago Pain Frequency: Constant (varies in intensity.) Pain Relieving Factors: heat with elevation.   Prior Functioning  Home Living Lives With: Family Home Access: Stairs to enter Entrance Stairs-Number of Steps: 5 Prior Function Leisure: Hobbies-yes (Comment)  Cognition/Observation Cognition Overall Cognitive Status: Appears within functional limits for tasks assessed  Sensation/Coordination/Flexibility/Functional Tests Functional Tests Functional Tests: LEFS 38/80  Assessment RLE AROM (degrees) Right Ankle Dorsiflexion: -5  Right Ankle Plantar Flexion: 50  Right Ankle Inversion: 30  Right Ankle Eversion: 5  RLE Strength Right Ankle  Dorsiflexion: 3-/5 Right Ankle Plantar Flexion: 2/5 Right Ankle Inversion: 2/5 Right Ankle Eversion: 2/5  Exercise/Treatments Mobility/Balance  Static Standing Balance Single Leg Stance - Right Leg: 4  Single Leg Stance - Left Leg: 30     Ankle Exercises - Supine Isometrics: 10 Other Supine Ankle Exercises: ROM       Physical Therapy Assessment and Plan PT Assessment and Plan Clinical Impression Statement: Pt with decreased dorsiflexion; decreased overall strength and decreased balance who will benefit from skilled therapy to return pt to her previous functional level.   Pt will benefit from skilled therapeutic intervention in order to improve on the following deficits: Decreased strength;Difficulty walking;Decreased mobility;Pain;Impaired flexibility Rehab Potential: Good PT Frequency: Min 2X/week PT Duration: 6 weeks PT Treatment/Interventions: Gait training;Balance training;Therapeutic exercise;Therapeutic activities;Patient/family education PT Plan: begin gastroc stretch, heel raises; minisquats, SLS, sitting/sl inversion/eversion and ABC next treatment.  3rd treatment begin sitting Baps and rockeroard . progress to t-band standing baps.    Goals Home Exercise Program Pt will Perform Home Exercise Program: Independently PT Short Term Goals Time to Complete Short Term Goals: 2 weeks PT Short Term Goal 1: Pt ROM to be WNL PT Short Term Goal 2: Pt strength to be increased 1/2 grade PT Short Term Goal 3: Pt to be able to walk for an hour PT Short Term Goal 4: Pain no greater than a 5 80% of the day PT Short Term Goal 5: sls x 30 sec PT Long Term Goals Time to Complete Long Term Goals:  (6) PT Long Term Goal 1: Pt to be weaned out of her brace PT Long Term  Goal 2: strength to be wnl Long Term Goal 3: no pain Long Term Goal 4: Pt to be able to be up on her foot for two hours at a time for shopping  Problem List Patient Active Problem List  Diagnosis  . Ankle sprain  .  Ankle pain, right  . Difficulty in walking  . Stiffness of joint, not elsewhere classified, ankle and foot  . Balance problems    PT - End of Session Activity Tolerance: Patient tolerated treatment well General Behavior During Session: [facility] for tasks performed Cognition: [facility] for tasks performed PT Plan of Care PT Home Exercise Plan: give  Consulted and Agree with Plan of Care: Patient  GP Functional Assessment Tool Used: LEFS Functional Limitation: Mobility: Walking and moving around Mobility: Walking and Moving Around Current Status ([id number]): At least 40 percent but less than 60 percent impaired, limited or restricted Mobility: Walking and Moving Around Goal Status [phone]): At least 1 percent but less than 20 percent impaired, limited or restricted  Malessa Zartman,CINDY 01/25/2012, 3:11 PM  Physician Documentation Your signature is required to indicate approval of the treatment plan as stated above.  Please sign and either send electronically or make a copy of this report for your files and return this physician signed original.   Please mark one 1.__approve of plan  2. ___approve of plan with the following conditions.   ______________________________                                                          _____________________ Physician Signature                                                                                                             Date

## 2012-01-30 ENCOUNTER — Ambulatory Visit (HOSPITAL_COMMUNITY)
Admission: RE | Admit: 2012-01-30 | Discharge: 2012-01-30 | Disposition: A | Discharge status: Home / Self Care | Payer: PRIVATE HEALTH INSURANCE | Source: Ambulatory Visit | Attending: Orthopedic Surgery | Admitting: Orthopedic Surgery

## 2012-01-30 NOTE — Progress Notes (Signed)
Physical Therapy Treatment Patient Details  Name: [name] MRN: [id number] Date of Birth: [birth year]  Today's Date: 01/30/2012 Time: 5784-6962 PT Time Calculation (min): 40 min  Visit#: 2  of 12   Re-eval: 02/24/12 Charges: [place] x 15' Therex x 23'  Authorization: medicare  Authorization Visit#: 2  of 10    Subjective: Symptoms/Limitations Symptoms: Pt reports HEP compliance. Pain Assessment Currently in Pain?: Yes Pain Score:   2 Pain Location: Ankle Pain Orientation: Right   Exercise/Treatments Ankle Exercises - Standing SLS: R: 10" max of 5 Heel Raises: 10 reps Toe Raise: 10 reps Ankle Exercises - Seated Towel Inversion/Eversion: 5 reps Ankle Exercises - Supine Isometrics: 10x each direction Other Supine Ankle Exercises: ROM all directions Ankle Exercises - Sidelying Ankle Inversion: 10 reps Ankle Eversion: 10 reps Manual Therapy Manual Therapy: Other (comment) Other Manual Therapy: PROM all directions; STM and MFR to decrease tightness, pain and adhesions  Physical Therapy Assessment and Plan PT Assessment and Plan Clinical Impression Statement: Pt presents with tenderness around R lateral malleolus. Hard prominence noted distal to R lateral malleolus. This area is very sensitive. Spoke with Donnamae Jude, PT about beginning [place] next session to decrease pain and possible calcification. Pt reports increase soreness t end of session. Pt plans to ice ankle at home. PT Plan: Continue per PT POC. Begin BAPS and rocker board next session. Also begin [place] to decrease pain and calcification.     Problem List Patient Active Problem List  Diagnosis  . Ankle sprain  . Ankle pain, right  . Difficulty in walking  . Stiffness of joint, not elsewhere classified, ankle and foot  . Balance problems    PT - End of Session Activity Tolerance: Patient tolerated treatment well General Behavior During Session: [facility] for tasks performed Cognition: [facility] for tasks  performed  GP Functional Assessment Tool Used: LEFS  Seth Bake, PTA 01/30/2012, 10:32 AM

## 2012-02-01 ENCOUNTER — Ambulatory Visit (HOSPITAL_COMMUNITY): Payer: PRIVATE HEALTH INSURANCE | Admitting: *Deleted

## 2012-02-06 ENCOUNTER — Ambulatory Visit (HOSPITAL_COMMUNITY): Payer: PRIVATE HEALTH INSURANCE | Admitting: *Deleted

## 2012-02-08 ENCOUNTER — Ambulatory Visit (HOSPITAL_COMMUNITY): Payer: PRIVATE HEALTH INSURANCE | Admitting: Physical Therapy

## 2012-02-13 ENCOUNTER — Ambulatory Visit (HOSPITAL_COMMUNITY): Payer: PRIVATE HEALTH INSURANCE | Admitting: Physical Therapy

## 2012-02-15 ENCOUNTER — Ambulatory Visit (HOSPITAL_COMMUNITY): Payer: PRIVATE HEALTH INSURANCE | Admitting: Physical Therapy

## 2012-02-20 ENCOUNTER — Ambulatory Visit (HOSPITAL_COMMUNITY): Payer: PRIVATE HEALTH INSURANCE | Admitting: *Deleted

## 2012-02-22 ENCOUNTER — Ambulatory Visit (HOSPITAL_COMMUNITY): Payer: PRIVATE HEALTH INSURANCE | Admitting: Physical Therapy

## 2012-04-01 ENCOUNTER — Encounter (HOSPITAL_COMMUNITY): Payer: Self-pay

## 2012-04-01 ENCOUNTER — Emergency Department (HOSPITAL_COMMUNITY)
Admission: EM | Admit: 2012-04-01 | Discharge: 2012-04-01 | Disposition: A | Discharge status: Home / Self Care | Payer: PRIVATE HEALTH INSURANCE | Attending: Emergency Medicine | Admitting: Emergency Medicine

## 2012-04-01 ENCOUNTER — Emergency Department (HOSPITAL_COMMUNITY): Payer: PRIVATE HEALTH INSURANCE

## 2012-04-01 DIAGNOSIS — M542 Cervicalgia: Secondary | ICD-10-CM | POA: Insufficient documentation

## 2012-04-01 DIAGNOSIS — I10 Essential (primary) hypertension: Secondary | ICD-10-CM | POA: Insufficient documentation

## 2012-04-01 DIAGNOSIS — R3589 Other polyuria: Secondary | ICD-10-CM | POA: Insufficient documentation

## 2012-04-01 DIAGNOSIS — G40909 Epilepsy, unspecified, not intractable, without status epilepticus: Secondary | ICD-10-CM | POA: Insufficient documentation

## 2012-04-01 DIAGNOSIS — Z79899 Other long term (current) drug therapy: Secondary | ICD-10-CM | POA: Insufficient documentation

## 2012-04-01 DIAGNOSIS — Z9889 Other specified postprocedural states: Secondary | ICD-10-CM | POA: Insufficient documentation

## 2012-04-01 DIAGNOSIS — F410 Panic disorder [episodic paroxysmal anxiety] without agoraphobia: Secondary | ICD-10-CM | POA: Insufficient documentation

## 2012-04-01 DIAGNOSIS — R55 Syncope and collapse: Secondary | ICD-10-CM | POA: Insufficient documentation

## 2012-04-01 DIAGNOSIS — R42 Dizziness and giddiness: Secondary | ICD-10-CM | POA: Insufficient documentation

## 2012-04-01 DIAGNOSIS — R358 Other polyuria: Secondary | ICD-10-CM | POA: Insufficient documentation

## 2012-04-01 DIAGNOSIS — N390 Urinary tract infection, site not specified: Secondary | ICD-10-CM | POA: Insufficient documentation

## 2012-04-01 LAB — CBC WITH DIFFERENTIAL/PLATELET
Basophils Absolute: 0 10*3/uL (ref 0.0–0.1)
Basophils Relative: 0 % (ref 0–1)
Eosinophils Absolute: 0.1 10*3/uL (ref 0.0–0.7)
MCH: 31.5 pg (ref 26.0–34.0)
MCHC: 35.7 g/dL (ref 30.0–36.0)
Neutrophils Relative %: 75 % (ref 43–77)
Platelets: 216 10*3/uL (ref 150–400)

## 2012-04-01 LAB — URINE MICROSCOPIC-ADD ON

## 2012-04-01 LAB — BASIC METABOLIC PANEL
GFR calc Af Amer: >90 mL/min (ref >90)
GFR calc non Af Amer: >90 mL/min (ref >90)
Potassium: 4.5 mEq/L (ref 3.5–5.1)
Sodium: 136 mEq/L (ref 135–145)

## 2012-04-01 LAB — URINALYSIS, ROUTINE W REFLEX MICROSCOPIC
Leukocytes, UA: NEGATIVE
Protein, ur: NEGATIVE mg/dL
Urobilinogen, UA: 0.2 mg/dL (ref 0.0–1.0)

## 2012-04-01 MED ORDER — HYDROMORPHONE HCL 4 MG PO TABS: 4.0000 mg | ORAL_TABLET | ORAL | Status: DC | PRN

## 2012-04-01 MED ORDER — DIAZEPAM 5 MG PO TABS: 5.0000 mg | ORAL_TABLET | Freq: Four times a day (QID) | ORAL | Status: DC | PRN

## 2012-04-01 MED ORDER — HYDROMORPHONE HCL PF 1 MG/ML IJ SOLN
1.0000 mg | Freq: Once | INTRAMUSCULAR | Status: AC
  Administered 2012-04-01: 1 mg via INTRAVENOUS
  Filled 2012-04-01: qty 1

## 2012-04-01 MED ORDER — SODIUM CHLORIDE 0.9 % IV SOLN: INTRAVENOUS | Status: DC

## 2012-04-01 MED ORDER — NITROFURANTOIN MONOHYD MACRO 100 MG PO CAPS: 100.0000 mg | ORAL_CAPSULE | Freq: Two times a day (BID) | ORAL | Status: DC

## 2012-04-01 MED ORDER — SODIUM CHLORIDE 0.9 % IV BOLUS (SEPSIS)
1000.0000 mL | Freq: Once | INTRAVENOUS | Status: AC
  Administered 2012-04-01: 1000 mL via INTRAVENOUS

## 2012-04-01 NOTE — ED Provider Notes (Signed)
History  This chart was scribed for Hurman Horn, MD by Manuela Schwartz, ED scribe. This patient was seen in room APA02/APA02 and the patient's care was started at 0943.   CSN: [id number]  Arrival date & time 04/01/12  2956   First MD Initiated Contact with Patient 04/01/12 1016      Chief Complaint  Patient presents with  . Neck Pain  . Near Syncope   The history is provided by the patient. No language interpreter was used.   [name] is a 41 y.o. female who presents to the Emergency Department w/chronic lower back pain complaining of 2 syncopal episodes last PM and gradual onset constant positioonal left sided neck pain since 4 days ago. She states the first syncopal episode last PM happened when she was seated on the toilet and began feeling warm, nauseated, light headed and then woke up few minutes later with her friend assisting her to stand up. She later had another syncopal episode witnessed a few hours later which lasted a few minutes as well. She denies any trauma during syncopal episodes. She states no syncopal episodes today and feels a little better, she denies any focal abnormalities such as unilateral weakness, slurred speech, tripping/off-balance, change in speech/vision/swallowing/understanding, HA. She was ambulatory after each event. No change in bladder or bowel function. She also complains of gradual onset left posterior, non-radiating, constant neck pain a 4 days ago, position pain worse w/ROM. No numbness or weakness of arms. Tried cooling/heating pad and percocet w/out relief for her chronic back pain No cough, fever, SOB, CP, abdominal pain, emesis, diarrhea.  Past Medical History  Diagnosis Date  . Seizures   . HTN (hypertension)   . Panic attacks   . Anxiety   . Polyuria     Past Surgical History  Procedure Date  . Back surgery     Family History  Problem Relation Age of Onset  . Heart disease    . Arthritis    . Cancer    . Diabetes       History  Substance Use Topics  . Smoking status: Never Smoker   . Smokeless tobacco: Not on file  . Alcohol Use: No    OB History    Grav Para Term Preterm Abortions TAB SAB Ect Mult Living                  Review of Systems  Constitutional: Negative for fever and chills.  HENT: Positive for neck pain (left paraspinal neck pain).   Respiratory: Negative for cough and shortness of breath.   Cardiovascular: Negative for chest pain.  Gastrointestinal: Negative for nausea, vomiting and abdominal pain.  Neurological: Positive for syncope and light-headedness. Negative for seizures, facial asymmetry and speech difficulty.  All other systems reviewed and are negative.    Allergies  Benadryl  Home Medications   Current Outpatient Rx  Name  Route  Sig  Dispense  Refill  . ALPRAZOLAM 0.25 MG PO TABS   Oral   Take 0.25 mg by mouth 4 (four) times daily as needed. anxiety         . AMLODIPINE BESYLATE 10 MG PO TABS   Oral   Take 10 mg by mouth daily.         Marland Kitchen LOSARTAN POTASSIUM 100 MG PO TABS   Oral   Take 100 mg by mouth daily.         Marland Kitchen OMEPRAZOLE 20 MG PO CPDR   Oral  Take 20 mg by mouth daily.         . OXYBUTYNIN CHLORIDE ER 10 MG PO TB24   Oral   Take 10 mg by mouth 2 (two) times daily.         . OXYCODONE-ACETAMINOPHEN 5-325 MG PO TABS   Oral   Take 1 tablet by mouth every 6 (six) hours as needed. pain         . PHENYTOIN SODIUM EXTENDED 100 MG PO CAPS   Oral   Take by mouth 4 (four) times daily.         Marland Kitchen DIAZEPAM 5 MG PO TABS   Oral   Take 1 tablet (5 mg total) by mouth every 6 (six) hours as needed for anxiety (spasms).   6 tablet   0   . HYDROMORPHONE HCL 4 MG PO TABS   Oral   Take 1 tablet (4 mg total) by mouth every 4 (four) hours as needed for pain.   6 tablet   0   . NITROFURANTOIN MONOHYD MACRO 100 MG PO CAPS   Oral   Take 1 capsule (100 mg total) by mouth 2 (two) times daily. X 7 days   14 capsule   0     Triage  Vitals: BP 138/89  Pulse 110  Temp 98.3 F (36.8 C) (Oral)  Resp 18  SpO2 99%  LMP 03/10/2012  Physical Exam  Nursing note and vitals reviewed. Constitutional: She is oriented to person, place, and time. She appears well-developed and well-nourished. No distress.  HENT:  Head: Normocephalic and atraumatic.  Eyes: EOM are normal.  Neck: Neck supple. No tracheal deviation present.  Cardiovascular: Normal rate, regular rhythm and normal heart sounds.   No murmur heard. Pulmonary/Chest: Effort normal and breath sounds normal. No respiratory distress. She has no wheezes. She has no rales.  Abdominal: Soft. Bowel sounds are normal. She exhibits no distension. There is no tenderness.  Musculoskeletal: Normal range of motion.       Diffuse cervical and upper thoracic midline and left sided tenderness and baseline paralumbar and lumbar tenderness bilaterally. Left arm w/intact light touch and motor function in the distributions of the axillary, radial, median, and ulnar nerves.   Neurological: She is alert and oriented to person, place, and time. No cranial nerve deficit. Coordination normal.  Skin: Skin is warm and dry.  Psychiatric: She has a normal mood and affect. Her behavior is normal.  PERRL, EOMI, periph visual fields intact, no facial asymmetry, motor 5/5 bilat no drift, normal LT bilat, normal F-N bilat  ED Course  Procedures (including critical care time) ECG: Sinus tachycardia, ventricular rate 102, normal axis, normal intervals, no definite acute ischemic changes noted, no significant change noted compared with April 2011 DIAGNOSTIC STUDIES: Oxygen Saturation is 99% on room air, normal by my interpretation.    COORDINATION OF CARE: At 1035 AM Discussed treatment plan with patient which includes IV fluids, EKG, blood work, thoracic spine X-ray. Patient agrees.   Labs Reviewed  BASIC METABOLIC PANEL - Abnormal; Notable for the following:    Glucose, Bld 113 (*)     All other  components within normal limits  URINALYSIS, ROUTINE W REFLEX MICROSCOPIC - Abnormal; Notable for the following:    Hgb urine dipstick MODERATE (*)     All other components within normal limits  URINE MICROSCOPIC-ADD ON - Abnormal; Notable for the following:    Squamous Epithelial / LPF FEW (*)     Bacteria, UA  MANY (*)     All other components within normal limits  CBC WITH DIFFERENTIAL  PREGNANCY, URINE  URINE CULTURE   Dg Cervical Spine Complete  04/01/2012  *RADIOLOGY REPORT*  Clinical Data: Neck pain.  CERVICAL SPINE - COMPLETE 4+ VIEW  Comparison: None.  Findings: There is no fracture, subluxation, facet arthritis, or disc space narrowing.  Minimal anterior spurring at C6-7. Prevertebral soft tissues are normal.  Neural foramina are widely patent.  IMPRESSION: No significant abnormality of the cervical spine.   Original Report Authenticated By: Francene Boyers, M.D.    Dg Thoracic Spine 2 View  04/01/2012  *RADIOLOGY REPORT*  Clinical Data: Thoracic spine pain.  THORACIC SPINE - 2 VIEW  Comparison: 01/12/2011  Findings: The thoracic vertebrae appear essentially normal with minimal osteophytes at T9-10.  No disc space narrowing, bone destruction, fracture, or other abnormality.  IMPRESSION: Normal thoracic spine.   Original Report Authenticated By: Francene Boyers, M.D.      1. Neck pain   2. Syncope   3. UTI (urinary tract infection)     MDM  I personally performed the services described in this documentation, which was scribed in my presence. The recorded information has been reviewed and is accurate.  Patient / Family / Caregiver informed of clinical course, understand medical decision-making process, and agree with plan.  I doubt any other EMC precluding discharge at this time including, but not necessarily limited to the following:SAH, CVA, Vtach, sepsis.         Hurman Horn, MD 04/01/12 2021

## 2012-04-01 NOTE — ED Notes (Signed)
Pt reports neck pain for last 3 days, describes as constant. Denies any known injury.   Last night stated she was on toilet and passed out. Friend witnessed episode and stated that she was "out for several minutes", had 2 episodes last night of the same. +nausea, no vomiting or diarrhea, no fever. Or headache.

## 2012-04-01 NOTE — ED Notes (Signed)
RN at bedside

## 2012-04-01 NOTE — ED Notes (Signed)
Family at bedside. Patient does not need anything at this time. 

## 2012-04-02 LAB — URINE CULTURE: Colony Count: 4000

## 2012-04-16 ENCOUNTER — Other Ambulatory Visit (HOSPITAL_COMMUNITY): Payer: Self-pay | Admitting: Pulmonary Disease

## 2012-04-16 DIAGNOSIS — Z139 Encounter for screening, unspecified: Secondary | ICD-10-CM

## 2012-04-17 ENCOUNTER — Ambulatory Visit (HOSPITAL_COMMUNITY)
Admission: RE | Admit: 2012-04-17 | Discharge: 2012-04-17 | Disposition: A | Discharge status: Home / Self Care | Payer: PRIVATE HEALTH INSURANCE | Source: Ambulatory Visit | Attending: Pulmonary Disease | Admitting: Pulmonary Disease

## 2012-04-17 DIAGNOSIS — Z1231 Encounter for screening mammogram for malignant neoplasm of breast: Secondary | ICD-10-CM | POA: Insufficient documentation

## 2012-04-17 DIAGNOSIS — Z139 Encounter for screening, unspecified: Secondary | ICD-10-CM

## 2012-04-20 ENCOUNTER — Other Ambulatory Visit: Payer: Self-pay | Admitting: Pulmonary Disease

## 2012-04-20 DIAGNOSIS — R928 Other abnormal and inconclusive findings on diagnostic imaging of breast: Secondary | ICD-10-CM

## 2012-05-09 ENCOUNTER — Ambulatory Visit (HOSPITAL_COMMUNITY)
Admission: RE | Admit: 2012-05-09 | Discharge: 2012-05-09 | Disposition: A | Discharge status: Home / Self Care | Payer: PRIVATE HEALTH INSURANCE | Source: Ambulatory Visit | Attending: Pulmonary Disease | Admitting: Pulmonary Disease

## 2012-05-09 ENCOUNTER — Other Ambulatory Visit: Payer: Self-pay | Admitting: Pulmonary Disease

## 2012-05-09 DIAGNOSIS — R928 Other abnormal and inconclusive findings on diagnostic imaging of breast: Secondary | ICD-10-CM | POA: Insufficient documentation

## 2012-10-15 ENCOUNTER — Other Ambulatory Visit (HOSPITAL_COMMUNITY): Payer: Self-pay | Admitting: Pulmonary Disease

## 2012-10-15 DIAGNOSIS — Z09 Encounter for follow-up examination after completed treatment for conditions other than malignant neoplasm: Secondary | ICD-10-CM

## 2012-11-07 ENCOUNTER — Other Ambulatory Visit (HOSPITAL_COMMUNITY): Payer: Self-pay | Admitting: Pulmonary Disease

## 2012-11-07 ENCOUNTER — Ambulatory Visit (HOSPITAL_COMMUNITY)
Admission: RE | Admit: 2012-11-07 | Discharge: 2012-11-07 | Disposition: A | Discharge status: Home / Self Care | Payer: PRIVATE HEALTH INSURANCE | Source: Ambulatory Visit | Attending: Pulmonary Disease | Admitting: Pulmonary Disease

## 2012-11-07 DIAGNOSIS — N63 Unspecified lump in unspecified breast: Secondary | ICD-10-CM | POA: Insufficient documentation

## 2012-11-07 DIAGNOSIS — Z09 Encounter for follow-up examination after completed treatment for conditions other than malignant neoplasm: Secondary | ICD-10-CM

## 2012-11-07 DIAGNOSIS — R928 Other abnormal and inconclusive findings on diagnostic imaging of breast: Secondary | ICD-10-CM | POA: Insufficient documentation

## 2013-01-01 ENCOUNTER — Other Ambulatory Visit (HOSPITAL_COMMUNITY): Payer: Self-pay | Admitting: Pulmonary Disease

## 2013-01-01 ENCOUNTER — Ambulatory Visit (HOSPITAL_COMMUNITY)
Admission: RE | Admit: 2013-01-01 | Discharge: 2013-01-01 | Disposition: A | Discharge status: Home / Self Care | Payer: PRIVATE HEALTH INSURANCE | Source: Ambulatory Visit | Attending: Pulmonary Disease | Admitting: Pulmonary Disease

## 2013-01-01 DIAGNOSIS — M545 Low back pain, unspecified: Secondary | ICD-10-CM | POA: Insufficient documentation

## 2013-01-01 DIAGNOSIS — M25559 Pain in unspecified hip: Secondary | ICD-10-CM | POA: Insufficient documentation

## 2013-02-19 ENCOUNTER — Other Ambulatory Visit (HOSPITAL_COMMUNITY): Payer: Self-pay | Admitting: Pulmonary Disease

## 2013-02-19 DIAGNOSIS — M542 Cervicalgia: Secondary | ICD-10-CM

## 2013-02-21 ENCOUNTER — Other Ambulatory Visit (HOSPITAL_COMMUNITY): Payer: Self-pay

## 2013-02-21 ENCOUNTER — Ambulatory Visit (HOSPITAL_COMMUNITY)
Admission: RE | Admit: 2013-02-21 | Discharge: 2013-02-21 | Disposition: A | Discharge status: Home / Self Care | Payer: PRIVATE HEALTH INSURANCE | Source: Ambulatory Visit | Attending: Pulmonary Disease | Admitting: Pulmonary Disease

## 2013-02-21 DIAGNOSIS — R0602 Shortness of breath: Secondary | ICD-10-CM

## 2013-02-21 DIAGNOSIS — M542 Cervicalgia: Secondary | ICD-10-CM | POA: Insufficient documentation

## 2013-02-21 DIAGNOSIS — E041 Nontoxic single thyroid nodule: Secondary | ICD-10-CM | POA: Insufficient documentation

## 2013-02-22 ENCOUNTER — Encounter: Payer: Self-pay | Admitting: Gastroenterology

## 2013-02-22 ENCOUNTER — Encounter (INDEPENDENT_AMBULATORY_CARE_PROVIDER_SITE_OTHER): Payer: Self-pay

## 2013-02-22 ENCOUNTER — Ambulatory Visit (INDEPENDENT_AMBULATORY_CARE_PROVIDER_SITE_OTHER): Payer: PRIVATE HEALTH INSURANCE | Admitting: Gastroenterology

## 2013-02-22 VITALS — BP 135/85 | HR 110 | Temp 97.0°F | Ht 66.0 in | Wt 179.6 lb

## 2013-02-22 DIAGNOSIS — R1310 Dysphagia, unspecified: Secondary | ICD-10-CM

## 2013-02-22 DIAGNOSIS — R1314 Dysphagia, pharyngoesophageal phase: Secondary | ICD-10-CM

## 2013-02-22 DIAGNOSIS — R1013 Epigastric pain: Secondary | ICD-10-CM

## 2013-02-22 DIAGNOSIS — R1012 Left upper quadrant pain: Secondary | ICD-10-CM

## 2013-02-22 MED ORDER — OMEPRAZOLE 20 MG PO CPDR: 20.0000 mg | DELAYED_RELEASE_CAPSULE | Freq: Two times a day (BID) | ORAL | Status: DC

## 2013-02-22 NOTE — Progress Notes (Signed)
Primary Care Physician:  Fredirick Maudlin, MD  Primary Gastroenterologist:  Roetta Sessions, MD   Chief Complaint  Patient presents with  . Dysphagia    HPI:  [name] is a 42 y.o. female here for further evaluation of esophageal dysphagia.   One month difficlty swallowing solid foods and pills. Denies odynophagia. At times pills will not go down and she brings them back up. No vomiting, some heaving. No heartburn. Has been on omeprazole for some time but she's not sure how long. Also with epigastric pain which radiates into the back. Occuring for two months. Not related to meals. Lasts for several minutes. Nothing helps it. BM regular. No melena, brbpr. No weight loss. No prior EGD/TCS.  Current Outpatient Prescriptions  Medication Sig Dispense Refill  . ALPRAZolam (XANAX) 0.25 MG tablet Take 0.25 mg by mouth 4 (four) times daily as needed. anxiety      . amLODipine (NORVASC) 10 MG tablet Take 10 mg by mouth daily.      Marland Kitchen losartan (COZAAR) 100 MG tablet Take 100 mg by mouth daily.      Marland Kitchen omeprazole (PRILOSEC) 20 MG capsule Take 20 mg by mouth daily.      Marland Kitchen oxybutynin (DITROPAN-XL) 10 MG 24 hr tablet Take 10 mg by mouth 2 (two) times daily.      Marland Kitchen oxyCODONE-acetaminophen (PERCOCET/ROXICET) 5-325 MG per tablet Take 1 tablet by mouth every 6 (six) hours as needed. pain      . phenytoin (DILANTIN) 100 MG ER capsule Take by mouth 4 (four) times daily.       No current facility-administered medications for this visit.    Allergies as of 02/22/2013 - Review Complete 02/22/2013  Allergen Reaction Noted  . Benadryl [diphenhydramine hcl]  01/17/2012    Past Medical History  Diagnosis Date  . Seizures   . HTN (hypertension)   . Panic attacks   . Anxiety   . Polyuria     Past Surgical History  Procedure Laterality Date  . Back surgery      complicated by ureteral injury    Family History  Problem Relation Age of Onset  . Heart disease    . Arthritis    . Throat cancer  Father   . Diabetes    . Colon cancer Neg Hx   . Liver disease Neg Hx   . Other Mother     esophageal stricture and gallbladder disease  . Other Sister     esophageal stricture and gallbladder problems.     History   Social History  . Marital Status: Single    Spouse Name: N/A    Number of Children: N/A  . Years of Education: 12   Occupational History  . disability    Social History Main Topics  . Smoking status: Former Games developer  . Smokeless tobacco: Not on file     Comment: smoked 8 years, quit 18 years ago in the 1996  . Alcohol Use: No  . Drug Use: No  . Sexual Activity: Yes    Birth Control/ Protection: None   Other Topics Concern  . Not on file   Social History Narrative  . No narrative on file      ROS:  General: Negative for anorexia, weight loss, fever, chills, fatigue, weakness. Eyes: Negative for vision changes.  ENT: Negative for hoarseness, difficulty swallowing , nasal congestion. CV: Negative for chest pain, angina, palpitations, dyspnea on exertion, peripheral edema.  Respiratory: Negative for dyspnea at rest, dyspnea  on exertion, cough, sputum, wheezing.  GI: See history of present illness. GU:  Negative for dysuria, hematuria, urinary incontinence, urinary frequency, nocturnal urination.  MS: Negative for joint pain, low back pain.  Derm: Negative for rash or itching.  Neuro: Negative for weakness, abnormal sensation, seizure, frequent headaches, memory loss, confusion.  Psych: some anxiety. Negative for depression, suicidal ideation, hallucinations.  Endo: Negative for unusual weight change.  Heme: Negative for bruising or bleeding. Allergy: Negative for rash or hives.    Physical Examination:  BP 135/85  Pulse 110  Temp(Src) 97 F (36.1 C) (Oral)  Ht 5\' 6"  (1.676 m)  Wt 179 lb 9.6 oz (81.466 kg)  BMI 29.00 kg/m2  LMP 02/11/2013   General: Well-nourished, well-developed in no acute distress.  Head: Normocephalic, atraumatic.   Eyes:  Conjunctiva pink, no icterus. Mouth: Oropharyngeal mucosa moist and pink , no lesions erythema or exudate. Neck: Supple without thyromegaly, masses, or lymphadenopathy.  Lungs: Clear to auscultation bilaterally.  Heart: Regular rate and rhythm, no murmurs rubs or gallops.  Abdomen: Bowel sounds are normal, mild to moderate epig/LUQ tenderness, nondistended, no hepatosplenomegaly or masses, no abdominal bruits or    hernia , no rebound or guarding.   Rectal: not performed Extremities: No lower extremity edema. No clubbing or deformities.  Neuro: Alert and oriented x 4 , grossly normal neurologically.  Skin: Warm and dry, no rash or jaundice.   Psych: Alert and cooperative, normal mood and affect.   Imaging Studies: US Soft Tissue Head/neck  02/21/2013   CLINICAL DATA:  Neck pain.  EXAM: THYROID ULTRASOUND  TECHNIQUE: Ultrasound examination of the thyroid gland and adjacent soft tissues was performed.  COMPARISON:  None.  FINDINGS: Right thyroid lobe  Measurements: 4.1 x 0.9 x 1.6 cm.  Benign 0.6 cm cyst present.  Left thyroid lobe  Measurements: 3.9 x 1.3 x 1.8 cm.  No nodules visualized.  Isthmus  Thickness: 0.2 cm.  No nodules visualized.  Thyroid gland size and echotexture is within normal limits.  Lymphadenopathy  None visualized.  IMPRESSION: Unremarkable thyroid ultrasound demonstrating benign 0.6 cm cyst in the right lobe.   Electronically Signed   By: Irish Lack M.D.   On: 02/21/2013 16:54

## 2013-02-22 NOTE — Assessment & Plan Note (Signed)
42 y/o with several month history of solid food and pill dysphagia in setting of chronic GERD. Has also had some epigastric pain radiating into back not related to meals. Recommend EGD/ED in the near future.  I have discussed the risks, alternatives, benefits with regards to but not limited to the risk of reaction to medication, bleeding, infection, perforation and the patient is agreeable to proceed. Written consent to be obtained.  Increase omeprazole 20mg  BID. If EGD does not reveal cause of abdominal pain, she will need further work-up.

## 2013-02-22 NOTE — Patient Instructions (Signed)
1. We have scheduled you for an upper endoscopy with Dr. Jena Gauss. Please see separate instructions. 2. Increase omeprazole to once before breakfast and once before evening meal.

## 2013-02-25 NOTE — Progress Notes (Signed)
Cc PCP 

## 2013-03-08 ENCOUNTER — Ambulatory Visit (HOSPITAL_COMMUNITY)
Admission: RE | Admit: 2013-03-08 | Discharge: 2013-03-08 | Disposition: A | Discharge status: Home / Self Care | Payer: PRIVATE HEALTH INSURANCE | Source: Ambulatory Visit | Attending: Pulmonary Disease | Admitting: Pulmonary Disease

## 2013-03-08 DIAGNOSIS — R0602 Shortness of breath: Secondary | ICD-10-CM | POA: Diagnosis present

## 2013-03-08 LAB — PULMONARY FUNCTION TEST
DL/VA: 4.75 ml/min/mmHg/L
DLCO unc: 20.81 ml/min/mmHg
FEF2575-%Change-Post: -9 %
FEV1-%Change-Post: -3 %
FEV1-%Pred-Pre: 92 %
FEV1-Post: 2.82 L
FEV1-Pre: 2.91 L
FEV1FVC-%Change-Post: 1 %
FEV6-Pre: 3.34 L
FEV6FVC-%Pred-Pre: 102 %
FVC-%Change-Post: -4 %
FVC-%Pred-Post: 81 %
Post FEV1/FVC ratio: 89 %
Post FEV6/FVC ratio: 100 %
Pre FEV6/FVC Ratio: 100 %
RV % pred: 97 %
RV: 1.7 L
TLC % pred: 94 %

## 2013-03-08 LAB — BLOOD GAS, ARTERIAL
pCO2 arterial: 36.4 mmHg (ref 35.0–45.0)
pH, Arterial: 7.427 (ref 7.350–7.450)
pO2, Arterial: 106 mmHg — ABNORMAL HIGH (ref 80.0–100.0)

## 2013-03-08 MED ORDER — ALBUTEROL SULFATE (5 MG/ML) 0.5% IN NEBU
2.5000 mg | INHALATION_SOLUTION | Freq: Once | RESPIRATORY_TRACT | Status: AC
  Administered 2013-03-08: 2.5 mg via RESPIRATORY_TRACT

## 2013-03-10 NOTE — Procedures (Signed)
[name], [name]            ACCOUNT NO.:  [id number]  MEDICAL RECORD NO.:  [id number]  LOCATION:  RESP                          FACILITY:  [facility]  PHYSICIAN:  Gaberial Cada L. Juanetta Gosling, M.D.DATE OF BIRTH:  1970/12/02  DATE OF PROCEDURE: DATE OF DISCHARGE:  03/08/2013                           PULMONARY FUNCTION TEST   Reason for pulmonary function testing is shortness of breath.  1. Spirometry shows no ventilatory defect and no evidence of airflow     obstruction. 2. Lung volumes are normal. 3. DLCO is minimally reduced. 4. Airway resistance is normal. 5. There is no significant bronchodilator improvement. 6. Arterial blood gas is normal. 7. No cause of shortness of breath is seen on this lung function test.     Ramon Dredge L. Juanetta Gosling, M.D.     ELH/MEDQ  D:  03/09/2013  T:  03/09/2013  Job:  [id number]

## 2013-03-12 ENCOUNTER — Encounter (HOSPITAL_COMMUNITY): Payer: Self-pay | Admitting: Pharmacy Technician

## 2013-03-20 ENCOUNTER — Ambulatory Visit (HOSPITAL_COMMUNITY)
Admission: RE | Admit: 2013-03-20 | Discharge: 2013-03-20 | Disposition: A | Discharge status: Home / Self Care | Payer: PRIVATE HEALTH INSURANCE | Source: Ambulatory Visit | Attending: Internal Medicine | Admitting: Internal Medicine

## 2013-03-20 ENCOUNTER — Encounter (HOSPITAL_COMMUNITY)
Admission: RE | Disposition: A | Discharge status: Home / Self Care | Payer: Self-pay | Source: Ambulatory Visit | Attending: Internal Medicine

## 2013-03-20 ENCOUNTER — Encounter (HOSPITAL_COMMUNITY): Payer: Self-pay | Admitting: *Deleted

## 2013-03-20 DIAGNOSIS — R1013 Epigastric pain: Secondary | ICD-10-CM | POA: Insufficient documentation

## 2013-03-20 DIAGNOSIS — I10 Essential (primary) hypertension: Secondary | ICD-10-CM | POA: Insufficient documentation

## 2013-03-20 DIAGNOSIS — R1310 Dysphagia, unspecified: Secondary | ICD-10-CM

## 2013-03-20 DIAGNOSIS — R1012 Left upper quadrant pain: Secondary | ICD-10-CM

## 2013-03-20 HISTORY — PX: ESOPHAGOGASTRODUODENOSCOPY (EGD) WITH ESOPHAGEAL DILATION: SHX5812

## 2013-03-20 SURGERY — ESOPHAGOGASTRODUODENOSCOPY (EGD) WITH ESOPHAGEAL DILATION
Anesthesia: Moderate Sedation

## 2013-03-20 MED ORDER — MEPERIDINE HCL 100 MG/ML IJ SOLN
INTRAMUSCULAR | Status: AC
  Filled 2013-03-20: qty 2

## 2013-03-20 MED ORDER — PROMETHAZINE HCL 25 MG/ML IJ SOLN
25.0000 mg | Freq: Once | INTRAMUSCULAR | Status: AC
  Administered 2013-03-20: 25 mg via INTRAVENOUS

## 2013-03-20 MED ORDER — ONDANSETRON HCL 4 MG/2ML IJ SOLN
INTRAMUSCULAR | Status: DC | PRN
  Administered 2013-03-20: 4 mg via INTRAVENOUS

## 2013-03-20 MED ORDER — BUTAMBEN-TETRACAINE-BENZOCAINE 2-2-14 % EX AERO
INHALATION_SPRAY | CUTANEOUS | Status: DC | PRN
  Administered 2013-03-20: 2 via TOPICAL

## 2013-03-20 MED ORDER — SODIUM CHLORIDE 0.9 % IV SOLN
INTRAVENOUS | Status: DC
  Administered 2013-03-20: 08:00:00 via INTRAVENOUS

## 2013-03-20 MED ORDER — SODIUM CHLORIDE 0.9 % IJ SOLN
INTRAMUSCULAR | Status: AC
  Filled 2013-03-20: qty 10

## 2013-03-20 MED ORDER — PROMETHAZINE HCL 25 MG/ML IJ SOLN
INTRAMUSCULAR | Status: AC
  Filled 2013-03-20: qty 1

## 2013-03-20 MED ORDER — STERILE WATER FOR IRRIGATION IR SOLN
Status: DC | PRN
  Administered 2013-03-20: 09:00:00

## 2013-03-20 MED ORDER — ONDANSETRON HCL 4 MG/2ML IJ SOLN
INTRAMUSCULAR | Status: AC
  Filled 2013-03-20: qty 2

## 2013-03-20 MED ORDER — MIDAZOLAM HCL 5 MG/5ML IJ SOLN
INTRAMUSCULAR | Status: DC | PRN
  Administered 2013-03-20: 2 mg via INTRAVENOUS
  Administered 2013-03-20: 1 mg via INTRAVENOUS

## 2013-03-20 MED ORDER — MEPERIDINE HCL 100 MG/ML IJ SOLN
INTRAMUSCULAR | Status: DC | PRN
  Administered 2013-03-20: 25 mg via INTRAVENOUS
  Administered 2013-03-20: 50 mg via INTRAVENOUS

## 2013-03-20 MED ORDER — MIDAZOLAM HCL 5 MG/5ML IJ SOLN
INTRAMUSCULAR | Status: AC
  Filled 2013-03-20: qty 10

## 2013-03-20 NOTE — Interval H&P Note (Signed)
History and Physical Interval Note:  03/20/2013 8:31 AM  [name]  has presented today for surgery, with the diagnosis of EPIGASTRIC PAIN, ESOPHAGEAL DYSPHAGIA AND LUQ PAIN  The various methods of treatment have been discussed with the patient and family. After consideration of risks, benefits and other options for treatment, the patient has consented to  Procedure(s) with comments: ESOPHAGOGASTRODUODENOSCOPY (EGD) WITH ESOPHAGEAL DILATION (N/A) - 8:30 as a surgical intervention .  The patient's history has been reviewed, patient examined, no change in status, stable for surgery.  I have reviewed the patient's chart and labs.  Questions were answered to the patient's satisfaction.   No change.The risks, benefits, limitations, alternatives and imponderables have been reviewed with the patient. Potential for esophageal dilation, biopsy, etc. have also been reviewed.  Questions have been answered. All parties agreeable.  Eula Listen

## 2013-03-20 NOTE — H&P (View-Only) (Signed)
Primary Care Physician:  HAWKINS,EDWARD L, MD  Primary Gastroenterologist:  Michael Rourk, MD   Chief Complaint  Patient presents with  . Dysphagia    HPI:  [name] is a 42 y.o. female here for further evaluation of esophageal dysphagia.   One month difficlty swallowing solid foods and pills. Denies odynophagia. At times pills will not go down and she brings them back up. No vomiting, some heaving. No heartburn. Has been on omeprazole for some time but she's not sure how long. Also with epigastric pain which radiates into the back. Occuring for two months. Not related to meals. Lasts for several minutes. Nothing helps it. BM regular. No melena, brbpr. No weight loss. No prior EGD/TCS.  Current Outpatient Prescriptions  Medication Sig Dispense Refill  . ALPRAZolam (XANAX) 0.25 MG tablet Take 0.25 mg by mouth 4 (four) times daily as needed. anxiety      . amLODipine (NORVASC) 10 MG tablet Take 10 mg by mouth daily.      . losartan (COZAAR) 100 MG tablet Take 100 mg by mouth daily.      . omeprazole (PRILOSEC) 20 MG capsule Take 20 mg by mouth daily.      . oxybutynin (DITROPAN-XL) 10 MG 24 hr tablet Take 10 mg by mouth 2 (two) times daily.      . oxyCODONE-acetaminophen (PERCOCET/ROXICET) 5-325 MG per tablet Take 1 tablet by mouth every 6 (six) hours as needed. pain      . phenytoin (DILANTIN) 100 MG ER capsule Take by mouth 4 (four) times daily.       No current facility-administered medications for this visit.    Allergies as of 02/22/2013 - Review Complete 02/22/2013  Allergen Reaction Noted  . Benadryl [diphenhydramine hcl]  01/17/2012    Past Medical History  Diagnosis Date  . Seizures   . HTN (hypertension)   . Panic attacks   . Anxiety   . Polyuria     Past Surgical History  Procedure Laterality Date  . Back surgery      complicated by ureteral injury    Family History  Problem Relation Age of Onset  . Heart disease    . Arthritis    . Throat cancer  Father   . Diabetes    . Colon cancer Neg Hx   . Liver disease Neg Hx   . Other Mother     esophageal stricture and gallbladder disease  . Other Sister     esophageal stricture and gallbladder problems.     History   Social History  . Marital Status: Single    Spouse Name: N/A    Number of Children: N/A  . Years of Education: 12   Occupational History  . disability    Social History Main Topics  . Smoking status: Former Smoker  . Smokeless tobacco: Not on file     Comment: smoked 8 years, quit 18 years ago in the 1996  . Alcohol Use: No  . Drug Use: No  . Sexual Activity: Yes    Birth Control/ Protection: None   Other Topics Concern  . Not on file   Social History Narrative  . No narrative on file      ROS:  General: Negative for anorexia, weight loss, fever, chills, fatigue, weakness. Eyes: Negative for vision changes.  ENT: Negative for hoarseness, difficulty swallowing , nasal congestion. CV: Negative for chest pain, angina, palpitations, dyspnea on exertion, peripheral edema.  Respiratory: Negative for dyspnea at rest, dyspnea   on exertion, cough, sputum, wheezing.  GI: See history of present illness. GU:  Negative for dysuria, hematuria, urinary incontinence, urinary frequency, nocturnal urination.  MS: Negative for joint pain, low back pain.  Derm: Negative for rash or itching.  Neuro: Negative for weakness, abnormal sensation, seizure, frequent headaches, memory loss, confusion.  Psych: some anxiety. Negative for depression, suicidal ideation, hallucinations.  Endo: Negative for unusual weight change.  Heme: Negative for bruising or bleeding. Allergy: Negative for rash or hives.    Physical Examination:  BP 135/85  Pulse 110  Temp(Src) 97 F (36.1 C) (Oral)  Ht 5' 6" (1.676 m)  Wt 179 lb 9.6 oz (81.466 kg)  BMI 29.00 kg/m2  LMP 02/11/2013   General: Well-nourished, well-developed in no acute distress.  Head: Normocephalic, atraumatic.   Eyes:  Conjunctiva pink, no icterus. Mouth: Oropharyngeal mucosa moist and pink , no lesions erythema or exudate. Neck: Supple without thyromegaly, masses, or lymphadenopathy.  Lungs: Clear to auscultation bilaterally.  Heart: Regular rate and rhythm, no murmurs rubs or gallops.  Abdomen: Bowel sounds are normal, mild to moderate epig/LUQ tenderness, nondistended, no hepatosplenomegaly or masses, no abdominal bruits or    hernia , no rebound or guarding.   Rectal: not performed Extremities: No lower extremity edema. No clubbing or deformities.  Neuro: Alert and oriented x 4 , grossly normal neurologically.  Skin: Warm and dry, no rash or jaundice.   Psych: Alert and cooperative, normal mood and affect.   Imaging Studies: Us Soft Tissue Head/neck  02/21/2013   CLINICAL DATA:  Neck pain.  EXAM: THYROID ULTRASOUND  TECHNIQUE: Ultrasound examination of the thyroid gland and adjacent soft tissues was performed.  COMPARISON:  None.  FINDINGS: Right thyroid lobe  Measurements: 4.1 x 0.9 x 1.6 cm.  Benign 0.6 cm cyst present.  Left thyroid lobe  Measurements: 3.9 x 1.3 x 1.8 cm.  No nodules visualized.  Isthmus  Thickness: 0.2 cm.  No nodules visualized.  Thyroid gland size and echotexture is within normal limits.  Lymphadenopathy  None visualized.  IMPRESSION: Unremarkable thyroid ultrasound demonstrating benign 0.6 cm cyst in the right lobe.   Electronically Signed   By: Glenn  Yamagata M.D.   On: 02/21/2013 16:54      

## 2013-03-20 NOTE — Op Note (Signed)
[facility] [address] [place], [address]   ENDOSCOPY PROCEDURE REPORT  PATIENT: Jasmin, Barr  MR#: [id number] BIRTHDATE: 03/14/1971 , 42  yrs. old GENDER: Female ENDOSCOPIST: R.  Roetta Sessions, MD FACP [facility] REFERRED BY:  Kari Baars, M.D. PROCEDURE DATE:  03/20/2013 PROCEDURE:     EGD with Elease Hashimoto dilation followed by esophageal biopsy  INDICATIONS:    Esophageal dysphagia; epigastric pain  INFORMED CONSENT:   The risks, benefits, limitations, alternatives and imponderables have been discussed.  The potential for biopsy, esophogeal dilation, etc. have also been reviewed.  Questions have been answered.  All parties agreeable.  Please see the history and physical in the medical record for more information.  MEDICATIONS: Versed 3 mg IV and Demerol 75 mg IV in divided doses. Cetacaine spray. Zofran 4 mg IV and  Phenergan 25 mg IV  DESCRIPTION OF PROCEDURE:   The AO-[id number] ([id number])  endoscope was introduced through the mouth and advanced to the second portion of the duodenum without difficulty or limitations.  The mucosal surfaces were surveyed very carefully during advancement of the scope and upon withdrawal.  Retroflexion view of the proximal stomach and esophagogastric junction was performed.      FINDINGS: Normal, patent appearing tubular esophagus. Stomach empty. Normal gastric mucosa. No hiatal hernia. Patent pylorus. Normal first and second portion of the  THERAPEUTIC / DIAGNOSTIC MANEUVERS PERFORMED:  A 54 French Maloney dilator was passed to full insertion easily. A look back revealed no apparent complication related to passage of the dilator. Subsequently, biopsies of the distal esophagus were taken for histologic study.   COMPLICATIONS:  None  IMPRESSION:   Normal EGD-status post passage of a Maloney dilator. Status post esophageal biopsy  RECOMMENDATIONS:   Stop omeprazole; begin Dexilant 60 mg daily-go by my office where  free samples are waiting for you. Followup on pathology.    _______________________________ R. Roetta Sessions, MD FACP [facility] eSigned:  R. Roetta Sessions, MD FACP [facility] 03/20/2013 9:04 AM     CC:

## 2013-03-22 ENCOUNTER — Encounter: Payer: Self-pay | Admitting: Internal Medicine

## 2013-03-25 ENCOUNTER — Encounter (HOSPITAL_COMMUNITY): Payer: Self-pay | Admitting: Internal Medicine

## 2013-03-25 ENCOUNTER — Telehealth: Payer: Self-pay

## 2013-03-25 NOTE — Telephone Encounter (Signed)
Letter mailed to pt. [name], please nic ov 

## 2013-03-25 NOTE — Telephone Encounter (Signed)
Cc PCP 

## 2013-03-25 NOTE — Telephone Encounter (Signed)
Pt is aware of OV on 3/17 at 10 with AS

## 2013-03-25 NOTE — Telephone Encounter (Signed)
Letter from: Corbin Ade  Reason for Letter: Results Review  Send letter to patient.  Send copy of letter with path to referring provider and PCP.  Need office visit with extender within the next 3 months if not already scheduled

## 2013-06-03 ENCOUNTER — Other Ambulatory Visit (HOSPITAL_COMMUNITY): Payer: Self-pay | Admitting: Pulmonary Disease

## 2013-06-03 DIAGNOSIS — R928 Other abnormal and inconclusive findings on diagnostic imaging of breast: Secondary | ICD-10-CM

## 2013-06-12 ENCOUNTER — Encounter (HOSPITAL_COMMUNITY): Payer: PRIVATE HEALTH INSURANCE

## 2013-06-18 ENCOUNTER — Ambulatory Visit: Payer: PRIVATE HEALTH INSURANCE | Admitting: Gastroenterology

## 2013-06-19 ENCOUNTER — Ambulatory Visit (HOSPITAL_COMMUNITY)
Admission: RE | Admit: 2013-06-19 | Discharge: 2013-06-19 | Disposition: A | Discharge status: Home / Self Care | Payer: PRIVATE HEALTH INSURANCE | Source: Ambulatory Visit | Attending: Pulmonary Disease | Admitting: Pulmonary Disease

## 2013-06-19 ENCOUNTER — Other Ambulatory Visit (HOSPITAL_COMMUNITY): Payer: Self-pay | Admitting: Pulmonary Disease

## 2013-06-19 DIAGNOSIS — R928 Other abnormal and inconclusive findings on diagnostic imaging of breast: Secondary | ICD-10-CM

## 2013-06-19 DIAGNOSIS — Z09 Encounter for follow-up examination after completed treatment for conditions other than malignant neoplasm: Secondary | ICD-10-CM | POA: Insufficient documentation

## 2013-06-19 DIAGNOSIS — N63 Unspecified lump in unspecified breast: Secondary | ICD-10-CM | POA: Insufficient documentation

## 2013-06-30 ENCOUNTER — Other Ambulatory Visit: Payer: Self-pay | Admitting: Gastroenterology

## 2013-07-17 ENCOUNTER — Telehealth: Payer: Self-pay | Admitting: Gastroenterology

## 2013-07-17 ENCOUNTER — Encounter: Payer: Self-pay | Admitting: Gastroenterology

## 2013-07-17 ENCOUNTER — Ambulatory Visit: Payer: PRIVATE HEALTH INSURANCE | Admitting: Gastroenterology

## 2013-07-17 NOTE — Telephone Encounter (Signed)
Mailed letter °

## 2013-07-17 NOTE — Telephone Encounter (Signed)
Pt was a no show

## 2013-12-03 ENCOUNTER — Encounter (INDEPENDENT_AMBULATORY_CARE_PROVIDER_SITE_OTHER): Payer: Self-pay

## 2013-12-03 ENCOUNTER — Ambulatory Visit (INDEPENDENT_AMBULATORY_CARE_PROVIDER_SITE_OTHER): Payer: PRIVATE HEALTH INSURANCE | Admitting: Gastroenterology

## 2013-12-03 ENCOUNTER — Encounter: Payer: Self-pay | Admitting: Gastroenterology

## 2013-12-03 VITALS — BP 138/87 | HR 104 | Temp 97.4°F | Ht 66.0 in | Wt 162.0 lb

## 2013-12-03 DIAGNOSIS — G8929 Other chronic pain: Secondary | ICD-10-CM

## 2013-12-03 DIAGNOSIS — K5900 Constipation, unspecified: Secondary | ICD-10-CM

## 2013-12-03 DIAGNOSIS — R1013 Epigastric pain: Secondary | ICD-10-CM

## 2013-12-03 MED ORDER — LINACLOTIDE 145 MCG PO CAPS: 145.0000 ug | ORAL_CAPSULE | Freq: Every day | ORAL | Status: DC

## 2013-12-03 NOTE — Patient Instructions (Signed)
For constipation: stop Miralax. Start Linzess 1 capsule each morning, 30 minutes before breakfast. I have provided a voucher for you for a month supply free.   We are setting you up for an ultrasound of your gallbladder for further assessment. I am also getting any labs from Dr. Luan Pulling to review.  Further recommendations to follow!

## 2013-12-03 NOTE — Progress Notes (Signed)
Referring Provider: Alonza Bogus, MD Primary Care Physician:  Alonza Bogus, MD Primary GI: Dr. Gala Romney  Chief Complaint  Patient presents with  . Follow-up    still having abd pain    HPI:  [name] presents today in follow-up with history of dysphagia and dyspepsia. Dysphagia improved after empiric dilation. Unremarkable esophageal biopsy, gastric mucosa normal. Epigastric pain intermittent. Hasn't really seen any exacerbating factors. Riding the lawnmower and had to bend forward due to pain. Radiates straight through to back. Sometimes feels worse with movement. No N/V with pain. Feels like Prilosec doesn't want to go down. Takes BID. No solid food dysphagia but does still note pill dysphagia. No lack of appetite or unexplained weight loss.   Miralax for constipation.   Past Medical History  Diagnosis Date  . Seizures   . HTN (hypertension)   . Panic attacks   . Anxiety   . Polyuria     Past Surgical History  Procedure Laterality Date  . Back surgery      complicated by ureteral injury  . Esophagogastroduodenoscopy (egd) with esophageal dilation N/A 03/20/2013    Dr. Rourk:Normal EGD-status post passage of a Maloney dilator/Status post esophageal biopsy., benign path    Current Outpatient Prescriptions  Medication Sig Dispense Refill  . ALPRAZolam (XANAX) 0.25 MG tablet Take 0.25 mg by mouth 4 (four) times daily as needed. anxiety      . amLODipine (NORVASC) 10 MG tablet Take 10 mg by mouth daily.      . benzonatate (TESSALON) 100 MG capsule Take 200 mg by mouth 2 (two) times daily as needed for cough.      . losartan (COZAAR) 100 MG tablet Take 100 mg by mouth daily.      Marland Kitchen omeprazole (PRILOSEC) 20 MG capsule TAKE ONE CAPSULE BY MOUTH TWICE A DAY BEFORE MEALS  60 capsule  3  . oxybutynin (DITROPAN-XL) 10 MG 24 hr tablet Take 10 mg by mouth 2 (two) times daily.      Marland Kitchen oxyCODONE-acetaminophen (PERCOCET/ROXICET) 5-325 MG per tablet Take 1 tablet by  mouth every 6 (six) hours as needed. pain      . phenytoin (DILANTIN) 100 MG ER capsule Take 200 mg by mouth 2 (two) times daily.       . polyethylene glycol (MIRALAX / GLYCOLAX) packet Take 17 g by mouth daily.      . Linaclotide (LINZESS) 145 MCG CAPS capsule Take 1 capsule (145 mcg total) by mouth daily. 30 minutes before breakfast.  30 capsule  5   No current facility-administered medications for this visit.    Allergies as of 12/03/2013 - Review Complete 12/03/2013  Allergen Reaction Noted  . Benadryl [diphenhydramine hcl] Rash 01/17/2012    Family History  Problem Relation Age of Onset  . Heart disease    . Arthritis    . Throat cancer Father   . Diabetes    . Colon cancer Neg Hx   . Liver disease Neg Hx   . Other Mother     esophageal stricture and gallbladder disease  . Other Sister     esophageal stricture and gallbladder problems.   . Pancreatic cancer Neg Hx     History   Social History  . Marital Status: Single    Spouse Name: N/A    Number of Children: N/A  . Years of Education: 12   Occupational History  . disability    Social History Main Topics  .  Smoking status: Former Research scientist (life sciences)  . Smokeless tobacco: None     Comment: smoked 8 years, quit 18 years ago in the 1996  . Alcohol Use: No  . Drug Use: No  . Sexual Activity: Yes    Birth Control/ Protection: None   Other Topics Concern  . None   Social History Narrative  . None    Review of Systems: As mentioned in HPI.   Physical Exam: BP 138/87  Pulse 104  Temp(Src) 97.4 F (36.3 C) (Oral)  Ht 5' 6"  (1.676 m)  Wt 162 lb (73.483 kg)  BMI 26.16 kg/m2  LMP 11/25/2013 General:   Alert and oriented. No distress noted. Pleasant and cooperative.  Head:  Normocephalic and atraumatic. Eyes:  Conjuctiva clear without scleral icterus. Mouth:  Oral mucosa pink and moist. Good dentition. No lesions Heart:  S1, S2 present without murmurs, rubs, or gallops. Regular rate and rhythm. Abdomen:  +BS, soft,  TTP LLQ and non-distended. No rebound or guarding. No HSM or masses noted. Msk:  Symmetrical without gross deformities. Normal posture. Extremities:  Without edema. Neurologic:  Alert and  oriented x4;  grossly normal neurologically.Marland Kitchen Psych:  Alert and cooperative. Normal mood and affect.

## 2013-12-08 ENCOUNTER — Encounter: Payer: Self-pay | Admitting: Gastroenterology

## 2013-12-08 DIAGNOSIS — K5900 Constipation, unspecified: Secondary | ICD-10-CM | POA: Insufficient documentation

## 2013-12-08 DIAGNOSIS — R1013 Epigastric pain: Principal | ICD-10-CM

## 2013-12-08 DIAGNOSIS — G8929 Other chronic pain: Secondary | ICD-10-CM | POA: Insufficient documentation

## 2013-12-08 NOTE — Assessment & Plan Note (Signed)
Currently on Miralax; needs more aggressive regimen. No concerning lower GI symptoms. Start Linzess 145 mcg daily.

## 2013-12-08 NOTE — Assessment & Plan Note (Addendum)
Chronic epigastric pain, gallbladder remains in situ, proceed with [place] of abdomen. EGD unrevealing. Solid food dysphagia improved with empiric dilation. Consider BPE if any further pill dysphagia. Retrieve outside labs.

## 2013-12-10 NOTE — Progress Notes (Signed)
Cc to pcp °

## 2013-12-17 ENCOUNTER — Ambulatory Visit (HOSPITAL_COMMUNITY)
Admission: RE | Admit: 2013-12-17 | Discharge: 2013-12-17 | Disposition: A | Discharge status: Home / Self Care | Payer: PRIVATE HEALTH INSURANCE | Source: Ambulatory Visit | Attending: Gastroenterology | Admitting: Gastroenterology

## 2013-12-17 DIAGNOSIS — G8929 Other chronic pain: Secondary | ICD-10-CM

## 2013-12-17 DIAGNOSIS — R1013 Epigastric pain: Secondary | ICD-10-CM | POA: Insufficient documentation

## 2013-12-25 NOTE — Progress Notes (Signed)
Quick Note:  Fatty liver on ultrasound.  Proceed with HIDA to assess for biliary dyskinesia.   ______

## 2014-01-03 ENCOUNTER — Other Ambulatory Visit (HOSPITAL_COMMUNITY): Payer: Self-pay | Admitting: Pulmonary Disease

## 2014-01-03 DIAGNOSIS — Z09 Encounter for follow-up examination after completed treatment for conditions other than malignant neoplasm: Secondary | ICD-10-CM

## 2014-01-06 ENCOUNTER — Other Ambulatory Visit: Payer: Self-pay

## 2014-01-06 DIAGNOSIS — R1013 Epigastric pain: Secondary | ICD-10-CM

## 2014-01-06 DIAGNOSIS — K760 Fatty (change of) liver, not elsewhere classified: Secondary | ICD-10-CM

## 2014-01-07 ENCOUNTER — Encounter (HOSPITAL_COMMUNITY): Payer: PRIVATE HEALTH INSURANCE

## 2014-01-08 ENCOUNTER — Ambulatory Visit (HOSPITAL_COMMUNITY)
Admission: RE | Admit: 2014-01-08 | Discharge: 2014-01-08 | Disposition: A | Discharge status: Home / Self Care | Payer: PRIVATE HEALTH INSURANCE | Source: Ambulatory Visit | Attending: Internal Medicine | Admitting: Internal Medicine

## 2014-01-08 DIAGNOSIS — K760 Fatty (change of) liver, not elsewhere classified: Secondary | ICD-10-CM

## 2014-01-10 ENCOUNTER — Encounter (HOSPITAL_COMMUNITY)
Admission: RE | Admit: 2014-01-10 | Discharge: 2014-01-10 | Disposition: A | Discharge status: Home / Self Care | Payer: PRIVATE HEALTH INSURANCE | Source: Ambulatory Visit | Attending: Internal Medicine | Admitting: Internal Medicine

## 2014-01-10 ENCOUNTER — Encounter (HOSPITAL_COMMUNITY): Payer: Self-pay

## 2014-01-10 DIAGNOSIS — K760 Fatty (change of) liver, not elsewhere classified: Secondary | ICD-10-CM | POA: Diagnosis not present

## 2014-01-10 MED ORDER — TECHNETIUM TC 99M MEBROFENIN IV KIT
5.0000 | PACK | Freq: Once | INTRAVENOUS | Status: AC | PRN
Start: 2014-01-10 — End: 2014-01-10
  Administered 2014-01-10: 5 via INTRAVENOUS

## 2014-01-10 MED ORDER — STERILE WATER FOR INJECTION IJ SOLN
INTRAMUSCULAR | Status: AC
  Filled 2014-01-10: qty 10

## 2014-01-10 MED ORDER — SODIUM CHLORIDE 0.9 % IJ SOLN
INTRAMUSCULAR | Status: AC
  Filled 2014-01-10: qty 12

## 2014-01-10 MED ORDER — SINCALIDE 5 MCG IJ SOLR
INTRAMUSCULAR | Status: AC
  Filled 2014-01-10: qty 5

## 2014-01-15 ENCOUNTER — Telehealth: Payer: Self-pay

## 2014-01-15 NOTE — Telephone Encounter (Signed)
Pt called- left message requesting results.

## 2014-01-16 NOTE — Telephone Encounter (Signed)
Pt aware of results 

## 2014-01-16 NOTE — Progress Notes (Signed)
Quick Note:  HIDA normal. How is patient doing? ______

## 2014-01-16 NOTE — Telephone Encounter (Signed)
Please see result note 

## 2014-01-21 ENCOUNTER — Ambulatory Visit (HOSPITAL_COMMUNITY)
Admission: RE | Admit: 2014-01-21 | Discharge: 2014-01-21 | Disposition: A | Discharge status: Home / Self Care | Payer: PRIVATE HEALTH INSURANCE | Source: Ambulatory Visit | Attending: Pulmonary Disease | Admitting: Pulmonary Disease

## 2014-01-21 ENCOUNTER — Other Ambulatory Visit (HOSPITAL_COMMUNITY): Payer: Self-pay | Admitting: Pulmonary Disease

## 2014-01-21 DIAGNOSIS — N63 Unspecified lump in breast: Secondary | ICD-10-CM | POA: Insufficient documentation

## 2014-01-21 DIAGNOSIS — Z09 Encounter for follow-up examination after completed treatment for conditions other than malignant neoplasm: Secondary | ICD-10-CM

## 2014-02-01 ENCOUNTER — Other Ambulatory Visit: Payer: Self-pay | Admitting: Gastroenterology

## 2014-03-12 NOTE — Progress Notes (Signed)
Outside labs reviewed dated July 2015. LFTs normal.

## 2014-04-30 ENCOUNTER — Other Ambulatory Visit: Payer: Self-pay | Admitting: Advanced Practice Midwife

## 2014-05-14 ENCOUNTER — Other Ambulatory Visit (HOSPITAL_COMMUNITY)
Admission: RE | Admit: 2014-05-14 | Discharge: 2014-05-14 | Disposition: A | Discharge status: Home / Self Care | Payer: Medicaid Other | Source: Ambulatory Visit | Attending: Advanced Practice Midwife | Admitting: Advanced Practice Midwife

## 2014-05-14 ENCOUNTER — Encounter: Payer: Self-pay | Admitting: Advanced Practice Midwife

## 2014-05-14 ENCOUNTER — Ambulatory Visit (INDEPENDENT_AMBULATORY_CARE_PROVIDER_SITE_OTHER): Payer: Medicare Other | Admitting: Advanced Practice Midwife

## 2014-05-14 VITALS — BP 148/86 | Ht 66.0 in | Wt 163.5 lb

## 2014-05-14 DIAGNOSIS — Z01419 Encounter for gynecological examination (general) (routine) without abnormal findings: Secondary | ICD-10-CM | POA: Insufficient documentation

## 2014-05-14 DIAGNOSIS — F520 Hypoactive sexual desire disorder: Secondary | ICD-10-CM

## 2014-05-14 DIAGNOSIS — Z1151 Encounter for screening for human papillomavirus (HPV): Secondary | ICD-10-CM | POA: Insufficient documentation

## 2014-05-14 MED ORDER — MEGESTROL ACETATE 40 MG PO TABS: ORAL_TABLET | ORAL | Status: DC

## 2014-05-14 MED ORDER — FLIBANSERIN 100 MG PO TABS: 1.0000 | ORAL_TABLET | Freq: Every day | ORAL | Status: DC

## 2014-05-14 NOTE — Progress Notes (Signed)
[name] 44 y.o.  Filed Vitals:   05/14/14 1050  BP: 148/86     Past Medical History: Past Medical History  Diagnosis Date  . Seizures   . HTN (hypertension)   . Panic attacks   . Anxiety   . Polyuria     Past Surgical History: Past Surgical History  Procedure Laterality Date  . Back surgery      complicated by ureteral injury  . Esophagogastroduodenoscopy (egd) with esophageal dilation N/A 03/20/2013    Dr. Rourk:Normal EGD-status post passage of a Maloney dilator/Status post esophageal biopsy., benign path    Family History: Family History  Problem Relation Age of Onset  . Throat cancer Father   . Heart disease Father   . Colon cancer Neg Hx   . Liver disease Neg Hx   . Pancreatic cancer Neg Hx   . Other Mother     esophageal stricture and gallbladder disease  . Other Sister     esophageal stricture and gallbladder problems.   . Diabetes Sister   . Heart disease Maternal Grandfather   . Diabetes Maternal Grandfather   . Arthritis Maternal Grandfather     Social History: History  Substance Use Topics  . Smoking status: Former Research scientist (life sciences)  . Smokeless tobacco: Not on file     Comment: smoked 8 years, quit 18 years ago in the 1996  . Alcohol Use: No    Allergies:  Allergies  Allergen Reactions  . Benadryl [Diphenhydramine Hcl] Rash      Current outpatient prescriptions:  .  ALPRAZolam (XANAX) 0.25 MG tablet, Take 0.25 mg by mouth 4 (four) times daily as needed. anxiety, Disp: , Rfl:  .  amLODipine (NORVASC) 10 MG tablet, Take 10 mg by mouth daily., Disp: , Rfl:  .  benzonatate (TESSALON) 100 MG capsule, Take 200 mg by mouth 2 (two) times daily as needed for cough., Disp: , Rfl:  .  Linaclotide (LINZESS) 145 MCG CAPS capsule, Take 1 capsule (145 mcg total) by mouth daily. 30 minutes before breakfast., Disp: 30 capsule, Rfl: 5 .  losartan (COZAAR) 100 MG tablet, Take 100 mg by mouth daily., Disp: , Rfl:  .  omeprazole (PRILOSEC) 20 MG capsule,  TAKE ONE CAPSULE BY MOUTH TWICE A DAY BEFORE MEALS, Disp: 60 capsule, Rfl: 5 .  oxybutynin (DITROPAN-XL) 10 MG 24 hr tablet, Take 10 mg by mouth 2 (two) times daily., Disp: , Rfl:  .  oxyCODONE-acetaminophen (PERCOCET/ROXICET) 5-325 MG per tablet, Take 1 tablet by mouth every 6 (six) hours as needed. pain, Disp: , Rfl:  .  phenytoin (DILANTIN) 100 MG ER capsule, Take 200 mg by mouth 2 (two) times daily. , Disp: , Rfl:  .  polyethylene glycol (MIRALAX / GLYCOLAX) packet, Take 17 g by mouth daily., Disp: , Rfl:    States that she takes xanax 2-3/week and percocet 2-3 times a week.  History of Present Illness: Here for a pap smear and physical.  Last pap 2012, normal. Also c/o spotting off and on for 2 weeks, from bright red to brown. Denies vaginal irritation, itching, or unusual discharge. Also c/o no sexual desire for 7 years.  Is in a stable relationship with a woman, describes their relationship as "wonderful" outside of the bedroom.  Has not had sex in 7 years (had back surgery with ureteral laceration, and hasn't had sex or the desire for sex since then). Was able to have orgasms, sex was not painful.  Just has no desire to  initiate.   ROS:  Patient denies any fever or chills, dizziness, headaches, blurred vision, shortness of breath, chest pain,palpitations, problems with urination. Has monthly periods POSITIVE for occ abdominal pain/constipation. Has been seeing [facility] for a few months to help sort this out.   POSITIVE for occasional spotting for 2 weeks  Physical Exam: General:  Well developed, well nourished, no acute distress Skin:  Warm and dry Neck:  Midline trachea, normal thyroid Lungs; Clear to auscultation bilaterally Breast:  No dominant palpable mass, retraction, or nipple discharge.  Current on mammograms Cardiovascular: Regular rate and rhythm Abdomen:  Soft, non tender, no hepatosplenomegaly.  Some mild tenderness over surgical scar Pelvic:  External  genitalia is normal in appearance.  The vagina is normal in appearance with normal discharge. The bladder is normal without prolapse. The cervix is friable.  Uterus is felt to be normal size, shape, and contour.  No adnexal masses or tenderness noted.  Extremities:  No swelling or varicosities noted Psych:  No mood changes.     Impression: normal gyn exam with the exception of cervicitis. Hypoactive sexual desire disorder  Plan: Metrogel qhs X5 Discussed female sexual desire.  Recommended discussing desire to please partner with her, and to commit to having regular (1/week?) sex with the hope that it will become less of a burden the more she participates.  Will rx Addyi 114m q hs, as well.  F/U 3 months to discuss whether or not addyi is helping.   50% or more of this visit was spent in counseling and coordination of care.  30 minutes of face to face time.

## 2014-05-15 LAB — CYTOLOGY - PAP

## 2014-05-20 ENCOUNTER — Telehealth: Payer: Self-pay | Admitting: Adult Health

## 2014-05-20 MED ORDER — METRONIDAZOLE 0.75 % VA GEL: 1.0000 | Freq: Two times a day (BID) | VAGINAL | Status: DC

## 2014-05-20 NOTE — Telephone Encounter (Signed)
Spoke with pt. Pt saw Manus Gunning last week and thought she was supposed to be given a cream. In Fran's note it says Metrogel qhs x 5 but pt was given Megace instead. She is taking the Megace, but has had a headache and urinary frequency ever since. Can you order Metrogel for pt? Thanks!! Stafford

## 2014-05-20 NOTE — Telephone Encounter (Signed)
Will rx metrogel as is in note, but stop megace,did not see note for that and call Manus Gunning next week

## 2014-05-22 ENCOUNTER — Other Ambulatory Visit (HOSPITAL_COMMUNITY): Payer: Self-pay | Admitting: Pulmonary Disease

## 2014-05-22 DIAGNOSIS — N6320 Unspecified lump in the left breast, unspecified quadrant: Secondary | ICD-10-CM

## 2014-05-22 DIAGNOSIS — Z1231 Encounter for screening mammogram for malignant neoplasm of breast: Secondary | ICD-10-CM

## 2014-06-02 ENCOUNTER — Telehealth: Payer: Self-pay | Admitting: Advanced Practice Midwife

## 2014-07-01 ENCOUNTER — Ambulatory Visit (HOSPITAL_COMMUNITY)
Admission: RE | Admit: 2014-07-01 | Discharge: 2014-07-01 | Disposition: A | Discharge status: Home / Self Care | Payer: Medicare Other | Source: Ambulatory Visit | Attending: Pulmonary Disease | Admitting: Pulmonary Disease

## 2014-07-01 ENCOUNTER — Other Ambulatory Visit (HOSPITAL_COMMUNITY): Payer: Self-pay | Admitting: Pulmonary Disease

## 2014-07-01 ENCOUNTER — Other Ambulatory Visit (HOSPITAL_COMMUNITY): Payer: Medicaid Other

## 2014-07-01 DIAGNOSIS — N6320 Unspecified lump in the left breast, unspecified quadrant: Secondary | ICD-10-CM

## 2014-07-01 DIAGNOSIS — N63 Unspecified lump in breast: Secondary | ICD-10-CM | POA: Diagnosis not present

## 2014-07-01 DIAGNOSIS — Z1231 Encounter for screening mammogram for malignant neoplasm of breast: Secondary | ICD-10-CM

## 2014-07-03 DIAGNOSIS — J329 Chronic sinusitis, unspecified: Secondary | ICD-10-CM | POA: Diagnosis not present

## 2014-07-03 DIAGNOSIS — M545 Low back pain: Secondary | ICD-10-CM | POA: Diagnosis not present

## 2014-07-03 DIAGNOSIS — G40309 Generalized idiopathic epilepsy and epileptic syndromes, not intractable, without status epilepticus: Secondary | ICD-10-CM | POA: Diagnosis not present

## 2014-07-03 DIAGNOSIS — I10 Essential (primary) hypertension: Secondary | ICD-10-CM | POA: Diagnosis not present

## 2014-08-01 ENCOUNTER — Other Ambulatory Visit: Payer: Self-pay | Admitting: Gastroenterology

## 2014-08-27 LAB — HEPATIC FUNCTION PANEL
ALK PHOS: 73 U/L
ALT: 14 U/L (ref 7–35)
AST: 15 U/L
Total Bilirubin: 0.3 mg/dL

## 2014-08-28 ENCOUNTER — Ambulatory Visit (INDEPENDENT_AMBULATORY_CARE_PROVIDER_SITE_OTHER): Payer: Medicare Other | Admitting: Gastroenterology

## 2014-08-28 ENCOUNTER — Encounter: Payer: Self-pay | Admitting: Gastroenterology

## 2014-08-28 VITALS — BP 143/97 | HR 97 | Temp 97.1°F | Ht 65.0 in | Wt 162.2 lb

## 2014-08-28 DIAGNOSIS — K5900 Constipation, unspecified: Secondary | ICD-10-CM

## 2014-08-28 DIAGNOSIS — G8929 Other chronic pain: Secondary | ICD-10-CM

## 2014-08-28 DIAGNOSIS — R1013 Epigastric pain: Secondary | ICD-10-CM

## 2014-08-28 MED ORDER — LUBIPROSTONE 8 MCG PO CAPS: 8.0000 ug | ORAL_CAPSULE | Freq: Two times a day (BID) | ORAL | Status: DC

## 2014-08-28 MED ORDER — PANTOPRAZOLE SODIUM 40 MG PO TBEC: 40.0000 mg | DELAYED_RELEASE_TABLET | Freq: Two times a day (BID) | ORAL | Status: DC

## 2014-08-28 NOTE — Progress Notes (Signed)
Referring Provider: Sinda Du, MD Primary Care Physician:  Alonza Bogus, MD  Primary GI: Dr. Gala Romney   Chief Complaint  Patient presents with  . Follow-up  . Gastrophageal Reflux    HPI:   [name] is a 44 y.o. female presenting today with a history of dysphagia and dyspepsia. Dysphagia improved after empiric dilation. Unremarkable esophageal biopsy, gastric mucosa normal. Epigastric pain intermittent. Started on Linzess 145 back in Sept 2015. Mild fatty liver Sept 2015, HIDA with normal EF at 90%.   Epigastric pain comes and goes. Intermittent nausea, some vomiting. Worse with eating at times, sometimes right after. Prilosec BID. Linzess 145 mcg too strong. Will use Miralax more but still not as productive. Dexilant without improvement. No unexplained lack of appetite or weight loss. Wakes up at night with it. Trying to lose weight.   Past Medical History  Diagnosis Date  . Seizures   . HTN (hypertension)   . Panic attacks   . Anxiety   . Polyuria     Past Surgical History  Procedure Laterality Date  . Back surgery      complicated by ureteral injury  . Esophagogastroduodenoscopy (egd) with esophageal dilation N/A 03/20/2013    Dr. Rourk:Normal EGD-status post passage of a Maloney dilator/Status post esophageal biopsy., benign path    Current Outpatient Prescriptions  Medication Sig Dispense Refill  . ALPRAZolam (XANAX) 0.25 MG tablet Take 0.25 mg by mouth 4 (four) times daily as needed. anxiety    . ALPRAZolam (XANAX) 0.5 MG tablet Take 0.5 mg by mouth 4 (four) times daily.  5  . amLODipine (NORVASC) 10 MG tablet Take 10 mg by mouth daily.    . benzonatate (TESSALON) 100 MG capsule Take 200 mg by mouth 2 (two) times daily as needed for cough.    . irbesartan (AVAPRO) 300 MG tablet Take 300 mg by mouth daily.  12  . Linaclotide (LINZESS) 145 MCG CAPS capsule Take 1 capsule (145 mcg total) by mouth daily. 30 minutes before breakfast. 30 capsule 5  .  omeprazole (PRILOSEC) 20 MG capsule TAKE ONE CAPSULE BY MOUTH TWICE A DAY BEFORE A MEAL 60 capsule 5  . oxybutynin (DITROPAN-XL) 10 MG 24 hr tablet Take 10 mg by mouth 2 (two) times daily.    Marland Kitchen oxyCODONE-acetaminophen (PERCOCET/ROXICET) 5-325 MG per tablet Take 1 tablet by mouth every 6 (six) hours as needed. pain    . phenytoin (DILANTIN) 100 MG ER capsule Take 200 mg by mouth 2 (two) times daily.     . polyethylene glycol (MIRALAX / GLYCOLAX) packet Take 17 g by mouth daily.    . Flibanserin (ADDYI) 100 MG TABS Take 1 tablet by mouth at bedtime. (Patient not taking: Reported on 08/28/2014) 30 tablet 4  . losartan (COZAAR) 100 MG tablet Take 100 mg by mouth daily.    . metroNIDAZOLE (METROGEL VAGINAL) 0.75 % vaginal gel Place 1 Applicatorful vaginally 2 (two) times daily. (Patient not taking: Reported on 08/28/2014) 70 g 0   No current facility-administered medications for this visit.    Allergies as of 08/28/2014 - Review Complete 08/28/2014  Allergen Reaction Noted  . Benadryl [diphenhydramine hcl] Rash 01/17/2012    Family History  Problem Relation Age of Onset  . Throat cancer Father   . Heart disease Father   . Colon cancer Neg Hx   . Liver disease Neg Hx   . Pancreatic cancer Neg Hx   . Other Mother     esophageal stricture  and gallbladder disease  . Other Sister     esophageal stricture and gallbladder problems.   . Diabetes Sister   . Heart disease Maternal Grandfather   . Diabetes Maternal Grandfather   . Arthritis Maternal Grandfather     History   Social History  . Marital Status: Single    Spouse Name: N/A  . Number of Children: N/A  . Years of Education: 12   Occupational History  . disability    Social History Main Topics  . Smoking status: Former Research scientist (life sciences)  . Smokeless tobacco: Not on file     Comment: smoked 8 years, quit 18 years ago in the 1996  . Alcohol Use: No  . Drug Use: No  . Sexual Activity: Not Currently    Birth Control/ Protection: None    Other Topics Concern  . None   Social History Narrative    Review of Systems: As mentioned in HPI  Physical Exam: BP 143/97 mmHg  Pulse 97  Temp(Src) 97.1 F (36.2 C)  Ht 5' 5"  (1.651 m)  Wt 162 lb 3.2 oz (73.573 kg)  BMI 26.99 kg/m2  LMP 08/28/2014 General:   Alert and oriented. No distress noted. Pleasant and cooperative.  Head:  Normocephalic and atraumatic. Eyes:  Conjuctiva clear without scleral icterus. Mouth:  Oral mucosa pink and moist. Good dentition. No lesions. Abdomen:  +BS, soft, TTP in epigastric and non-distended. No rebound or guarding. No HSM or masses noted. +CARNETT'S SIGN Msk:  Symmetrical without gross deformities. Normal posture. Extremities:  Without edema. Neurologic:  Alert and  oriented x4;  grossly normal neurologically. Psych:  Alert and cooperative. Normal mood and affect.

## 2014-08-28 NOTE — Patient Instructions (Signed)
Stop Prilosec. Start Protonix 30 minutes before breakfast and dinner. This is for reflux. I have sent it to your pharmacy.   I would like you to try Amitiza 1 gelcap WITH FOOD twice a day. Take with food to avoid nausea. This is to take the place of Miralax and Linzess.   I have scheduled a CT scan. I would like to see what this shows before deciding the next step.

## 2014-09-05 NOTE — Assessment & Plan Note (Signed)
Linzess too strong. Start Amitiza 8 mcg po BID.

## 2014-09-05 NOTE — Assessment & Plan Note (Signed)
Persistent epigastric pain, chronic, with EGD on file, HIDA normal EF at 90%, mild fatty liver. +Carnett's on exam. Question abdominal wall pain, but due to nocturnal symptoms will proceed with CT abd/pelvis. Doubt occult malignancy. Change from Prilosec to Protonix BID.

## 2014-09-08 ENCOUNTER — Ambulatory Visit (HOSPITAL_COMMUNITY)
Admission: RE | Admit: 2014-09-08 | Discharge: 2014-09-08 | Disposition: A | Discharge status: Home / Self Care | Payer: Medicare Other | Source: Ambulatory Visit | Attending: Gastroenterology | Admitting: Gastroenterology

## 2014-09-08 DIAGNOSIS — N8320 Unspecified ovarian cysts: Secondary | ICD-10-CM | POA: Insufficient documentation

## 2014-09-08 DIAGNOSIS — K5900 Constipation, unspecified: Secondary | ICD-10-CM | POA: Insufficient documentation

## 2014-09-08 DIAGNOSIS — R1013 Epigastric pain: Secondary | ICD-10-CM | POA: Diagnosis not present

## 2014-09-08 DIAGNOSIS — R110 Nausea: Secondary | ICD-10-CM | POA: Insufficient documentation

## 2014-09-08 DIAGNOSIS — G8929 Other chronic pain: Secondary | ICD-10-CM

## 2014-09-08 MED ORDER — IOHEXOL 300 MG/ML  SOLN
100.0000 mL | Freq: Once | INTRAMUSCULAR | Status: AC | PRN
  Administered 2014-09-08: 100 mL via INTRAVENOUS

## 2014-09-09 NOTE — Progress Notes (Signed)
Quick Note:  CT is normal. She has a 2.4 cm left ovarian cyst. Recommend routine follow-up with GYN> If she is still having persistent N/V, would recommend GES.  IF GES is normal, I recommend referral to pain management. ______

## 2014-09-10 NOTE — Progress Notes (Signed)
CC'ED TO PCP 

## 2014-10-22 DIAGNOSIS — Z0000 Encounter for general adult medical examination without abnormal findings: Secondary | ICD-10-CM | POA: Diagnosis not present

## 2014-11-04 DIAGNOSIS — I10 Essential (primary) hypertension: Secondary | ICD-10-CM | POA: Diagnosis not present

## 2014-11-04 DIAGNOSIS — G40309 Generalized idiopathic epilepsy and epileptic syndromes, not intractable, without status epilepticus: Secondary | ICD-10-CM | POA: Diagnosis not present

## 2014-11-04 DIAGNOSIS — G4730 Sleep apnea, unspecified: Secondary | ICD-10-CM | POA: Diagnosis not present

## 2014-11-04 DIAGNOSIS — E785 Hyperlipidemia, unspecified: Secondary | ICD-10-CM | POA: Diagnosis not present

## 2014-11-04 DIAGNOSIS — M545 Low back pain: Secondary | ICD-10-CM | POA: Diagnosis not present

## 2014-11-27 ENCOUNTER — Encounter (HOSPITAL_COMMUNITY): Payer: Self-pay | Admitting: Emergency Medicine

## 2014-11-27 ENCOUNTER — Emergency Department (HOSPITAL_COMMUNITY)
Admission: EM | Admit: 2014-11-27 | Discharge: 2014-11-27 | Disposition: A | Discharge status: Home / Self Care | Payer: Medicare Other | Attending: Emergency Medicine | Admitting: Emergency Medicine

## 2014-11-27 DIAGNOSIS — I10 Essential (primary) hypertension: Secondary | ICD-10-CM | POA: Diagnosis not present

## 2014-11-27 DIAGNOSIS — M545 Low back pain, unspecified: Secondary | ICD-10-CM

## 2014-11-27 DIAGNOSIS — Z79899 Other long term (current) drug therapy: Secondary | ICD-10-CM | POA: Diagnosis not present

## 2014-11-27 DIAGNOSIS — Z87891 Personal history of nicotine dependence: Secondary | ICD-10-CM | POA: Insufficient documentation

## 2014-11-27 DIAGNOSIS — F410 Panic disorder [episodic paroxysmal anxiety] without agoraphobia: Secondary | ICD-10-CM | POA: Diagnosis not present

## 2014-11-27 DIAGNOSIS — G40909 Epilepsy, unspecified, not intractable, without status epilepticus: Secondary | ICD-10-CM | POA: Diagnosis not present

## 2014-11-27 LAB — URINALYSIS, ROUTINE W REFLEX MICROSCOPIC
Bilirubin Urine: NEGATIVE
Glucose, UA: NEGATIVE mg/dL
HGB URINE DIPSTICK: NEGATIVE
Ketones, ur: NEGATIVE mg/dL
LEUKOCYTES UA: NEGATIVE
Nitrite: NEGATIVE
Protein, ur: NEGATIVE mg/dL
SPECIFIC GRAVITY, URINE: 1.015 (ref 1.005–1.030)
Urobilinogen, UA: 0.2 mg/dL (ref 0.0–1.0)
pH: 6.5 (ref 5.0–8.0)

## 2014-11-27 MED ORDER — TRAMADOL HCL 50 MG PO TABS: 50.0000 mg | ORAL_TABLET | Freq: Four times a day (QID) | ORAL | Status: DC | PRN

## 2014-11-27 MED ORDER — TRAMADOL HCL 50 MG PO TABS
100.0000 mg | ORAL_TABLET | Freq: Once | ORAL | Status: AC
  Administered 2014-11-27: 100 mg via ORAL
  Filled 2014-11-27: qty 2

## 2014-11-27 NOTE — Discharge Instructions (Signed)
Back Pain, Adult Back pain is very common. The pain often gets better over time. The cause of back pain is usually not dangerous. Most people can learn to manage their back pain on their own.  HOME CARE   Stay active. Start with short walks on flat ground if you can. Try to walk farther each day.  Do not sit, drive, or stand in one place for more than 30 minutes. Do not stay in bed.  Do not avoid exercise or work. Activity can help your back heal faster.  Be careful when you bend or lift an object. Bend at your knees, keep the object close to you, and do not twist.  Sleep on a firm mattress. Lie on your side, and bend your knees. If you lie on your back, put a pillow under your knees.  Only take medicines as told by your doctor.  Put ice on the injured area.  Put ice in a plastic bag.  Place a towel between your skin and the bag.  Leave the ice on for 15-20 minutes, 03-04 times a day for the first 2 to 3 days. After that, you can switch between ice and heat packs.  Ask your doctor about back exercises or massage.  Avoid feeling anxious or stressed. Find good ways to deal with stress, such as exercise. GET HELP RIGHT AWAY IF:   Your pain does not go away with rest or medicine.  Your pain does not go away in 1 week.  You have new problems.  You do not feel well.  The pain spreads into your legs.  You cannot control when you poop (bowel movement) or pee (urinate).  Your arms or legs feel weak or lose feeling (numbness).  You feel sick to your stomach (nauseous) or throw up (vomit).  You have belly (abdominal) pain.  You feel like you may pass out (faint). MAKE SURE YOU:   Understand these instructions.  Will watch your condition.  Will get help right away if you are not doing well or get worse. Document Released: 09/07/2007 Document Revised: 06/13/2011 Document Reviewed: 07/23/2013 [facility] Patient Information 2015 [place], [place]. This information is not intended  to replace advice given to you by your health care provider. Make sure you discuss any questions you have with your health care provider.

## 2014-11-27 NOTE — ED Provider Notes (Signed)
CSN: [id number]     Arrival date & time 11/27/14  1007 History   First MD Initiated Contact with Patient 11/27/14 1009     Chief Complaint  Patient presents with  . Back Pain     (Consider location/radiation/quality/duration/timing/severity/associated sxs/prior Treatment) HPI Comments: Pt comes in with right sided lower back pain that started 2 weeks ago. No known injury. Does have a history of surgery. Denies numbness, weakness or incontinence. Nothing makes it better or worse. She states that her doctor called in some muscle relaxers and they don't seem to be helping. She states that she has vicodin at home for pain and that isn't working as well. No dysuria  The history is provided by the patient. No language interpreter was used.    Past Medical History  Diagnosis Date  . Seizures   . HTN (hypertension)   . Panic attacks   . Anxiety   . Polyuria    Past Surgical History  Procedure Laterality Date  . Back surgery      complicated by ureteral injury  . Esophagogastroduodenoscopy (egd) with esophageal dilation N/A 03/20/2013    Dr. Rourk:Normal EGD-status post passage of a Maloney dilator/Status post esophageal biopsy., benign path   Family History  Problem Relation Age of Onset  . Throat cancer Father   . Heart disease Father   . Colon cancer Neg Hx   . Liver disease Neg Hx   . Pancreatic cancer Neg Hx   . Other Mother     esophageal stricture and gallbladder disease  . Other Sister     esophageal stricture and gallbladder problems.   . Diabetes Sister   . Heart disease Maternal Grandfather   . Diabetes Maternal Grandfather   . Arthritis Maternal Grandfather    Social History  Substance Use Topics  . Smoking status: Former Research scientist (life sciences)  . Smokeless tobacco: None     Comment: smoked 8 years, quit 18 years ago in the 1996  . Alcohol Use: No   OB History    No data available     Review of Systems  All other systems reviewed and are negative.     Allergies   Benadryl  Home Medications   Prior to Admission medications   Medication Sig Start Date End Date Taking? Authorizing Provider  ALPRAZolam (XANAX) 0.25 MG tablet Take 0.25 mg by mouth 4 (four) times daily as needed. anxiety    Historical Provider, MD  ALPRAZolam (XANAX) 0.5 MG tablet Take 0.5 mg by mouth 4 (four) times daily. 04/30/14   Historical Provider, MD  amLODipine (NORVASC) 10 MG tablet Take 10 mg by mouth daily.    Historical Provider, MD  benzonatate (TESSALON) 100 MG capsule Take 200 mg by mouth 2 (two) times daily as needed for cough.    Historical Provider, MD  Flibanserin (ADDYI) 100 MG TABS Take 1 tablet by mouth at bedtime. Patient not taking: Reported on 08/28/2014 05/14/14   Christin Fudge, CNM  irbesartan (AVAPRO) 300 MG tablet Take 300 mg by mouth daily. 07/03/14   Historical Provider, MD  Linaclotide Rolan Lipa) 145 MCG CAPS capsule Take 1 capsule (145 mcg total) by mouth daily. 30 minutes before breakfast. 12/03/13   Orvil Feil, NP  losartan (COZAAR) 100 MG tablet Take 100 mg by mouth daily.    Historical Provider, MD  lubiprostone (AMITIZA) 8 MCG capsule Take 1 capsule (8 mcg total) by mouth 2 (two) times daily with a meal. 08/28/14   Orvil Feil, NP  metroNIDAZOLE (METROGEL VAGINAL) 0.75 % vaginal gel Place 1 Applicatorful vaginally 2 (two) times daily. Patient not taking: Reported on 08/28/2014 05/20/14   Estill Dooms, NP  omeprazole (PRILOSEC) 20 MG capsule TAKE ONE CAPSULE BY MOUTH TWICE A DAY BEFORE A MEAL 08/01/14   Orvil Feil, NP  oxybutynin (DITROPAN-XL) 10 MG 24 hr tablet Take 10 mg by mouth 2 (two) times daily.    Historical Provider, MD  oxyCODONE-acetaminophen (PERCOCET/ROXICET) 5-325 MG per tablet Take 1 tablet by mouth every 6 (six) hours as needed. pain    Historical Provider, MD  pantoprazole (PROTONIX) 40 MG tablet Take 1 tablet (40 mg total) by mouth 2 (two) times daily before a meal. 08/28/14   Orvil Feil, NP  phenytoin (DILANTIN) 100 MG ER  capsule Take 200 mg by mouth 2 (two) times daily.     Historical Provider, MD  polyethylene glycol (MIRALAX / GLYCOLAX) packet Take 17 g by mouth daily.    Historical Provider, MD   BP 135/97 mmHg  Pulse 102  Temp(Src) 97.8 F (36.6 C) (Oral)  Resp 18  Ht 5' 6"  (1.676 m)  Wt 165 lb (74.844 kg)  BMI 26.64 kg/m2  SpO2 100%  LMP 11/05/2014 Physical Exam  Constitutional: She is oriented to person, place, and time. She appears well-developed and well-nourished.  Cardiovascular: Normal rate and regular rhythm.   Pulmonary/Chest: Effort normal and breath sounds normal.  Abdominal: Soft. Bowel sounds are normal. There is no tenderness.  Musculoskeletal:  Right lumbar paraspinal tenderness. Full rom of motion of bilateral lower extremities. Good sensation and stregth  Neurological: She is alert and oriented to person, place, and time. She exhibits normal muscle tone. Coordination normal.  Skin: Skin is warm and dry.  Psychiatric: She has a normal mood and affect.  Nursing note and vitals reviewed.   ED Course  Procedures (including critical care time) Labs Review Labs Reviewed  URINALYSIS, ROUTINE W REFLEX MICROSCOPIC (NOT AT [facility])    Imaging Review No results found. I have personally reviewed and evaluated these images and lab results as part of my medical decision-making.   EKG Interpretation None      MDM   Final diagnoses:  Right-sided low back pain without sciatica    Pt is neurologically intact. No red flags. Will send home with ultram for pain. Discussed return precautions and follow up with pt    Glendell Docker, NP 11/27/14 1112  Davonna Belling, MD 11/27/14 [phone]

## 2014-11-27 NOTE — ED Notes (Signed)
Pain to right back for last 2 weeks.  Denies injury.  Rates pain 8/10.Marland Kitchen

## 2014-11-28 ENCOUNTER — Ambulatory Visit (HOSPITAL_COMMUNITY)
Admission: RE | Admit: 2014-11-28 | Discharge: 2014-11-28 | Disposition: A | Discharge status: Home / Self Care | Payer: Medicare Other | Source: Ambulatory Visit | Attending: Pulmonary Disease | Admitting: Pulmonary Disease

## 2014-11-28 ENCOUNTER — Other Ambulatory Visit (HOSPITAL_COMMUNITY): Payer: Self-pay | Admitting: Pulmonary Disease

## 2014-11-28 DIAGNOSIS — M5489 Other dorsalgia: Secondary | ICD-10-CM

## 2014-11-28 DIAGNOSIS — R200 Anesthesia of skin: Secondary | ICD-10-CM | POA: Diagnosis not present

## 2014-11-28 DIAGNOSIS — M545 Low back pain: Secondary | ICD-10-CM | POA: Insufficient documentation

## 2014-12-23 ENCOUNTER — Other Ambulatory Visit: Payer: Self-pay

## 2014-12-24 MED ORDER — PANTOPRAZOLE SODIUM 40 MG PO TBEC: 40.0000 mg | DELAYED_RELEASE_TABLET | Freq: Two times a day (BID) | ORAL | Status: DC

## 2014-12-29 ENCOUNTER — Encounter: Payer: Self-pay | Admitting: Obstetrics and Gynecology

## 2014-12-29 ENCOUNTER — Ambulatory Visit (INDEPENDENT_AMBULATORY_CARE_PROVIDER_SITE_OTHER): Payer: Medicare Other | Admitting: Obstetrics and Gynecology

## 2014-12-29 VITALS — BP 150/86 | Ht 65.0 in | Wt 162.5 lb

## 2014-12-29 DIAGNOSIS — N939 Abnormal uterine and vaginal bleeding, unspecified: Secondary | ICD-10-CM | POA: Diagnosis not present

## 2014-12-29 NOTE — Progress Notes (Signed)
Patient ID: [name], female   DOB: June 05, 1970, 44 y.o.   MRN: [id number] Pt here today for vaginal bleeding and lower back pain. Pt states that Thursday night she had severe lower back pain and heavy bleeding along with stomach pain. Pt states that the pain and bleeding have gotten better but still having both. Pt state that she usually her periods are light and never like this. Pt states that she was told once before that she had an ovarian cyst but nothing ever had to be done about it.

## 2014-12-29 NOTE — Progress Notes (Signed)
Patient ID: [name], female   DOB: September 22, 1970, 44 y.o.   MRN: [id number]    [facility] Visit  Patient name: [name] MRN [id number]  Date of birth: [birth year]  CC & HPI:  [name] is a 44 y.o. female presenting today for LLQ and lower back pain that occurred a 3 days ago. Pt reports a "gush" of vaginal bleeding that occurred the next morning, but notes she is having a normal menses now. Her LMP was 8/29. Pt denies a history of menorrhagia.   Pt had a CT Abdomen Pelvic on 6/6 and was diagnosed with a 2.4 cm left ovarian cyst.   ROS:  A complete 10 system review of systems was obtained and all systems are negative except as noted in the HPI and PMH.   Pertinent History Reviewed:   Reviewed: Significant for left ovarian cyst Medical         Past Medical History  Diagnosis Date  . Seizures   . HTN (hypertension)   . Panic attacks   . Anxiety   . Polyuria                               Surgical Hx:    Past Surgical History  Procedure Laterality Date  . Back surgery      complicated by ureteral injury  . Esophagogastroduodenoscopy (egd) with esophageal dilation N/A 03/20/2013    Dr. Rourk:Normal EGD-status post passage of a Maloney dilator/Status post esophageal biopsy., benign path   Medications: Reviewed & Updated - see associated section                       Current outpatient prescriptions:  .  ALPRAZolam (XANAX) 0.5 MG tablet, Take 0.5 mg by mouth 4 (four) times daily., Disp: , Rfl: 5 .  amLODipine (NORVASC) 10 MG tablet, Take 10 mg by mouth daily., Disp: , Rfl:  .  irbesartan (AVAPRO) 300 MG tablet, Take 300 mg by mouth daily., Disp: , Rfl: 12 .  oxybutynin (DITROPAN-XL) 10 MG 24 hr tablet, Take 10 mg by mouth 2 (two) times daily., Disp: , Rfl:  .  oxyCODONE-acetaminophen (PERCOCET/ROXICET) 5-325 MG per tablet, Take 1 tablet by mouth every 6 (six) hours as needed. pain, Disp: , Rfl:  .  pantoprazole (PROTONIX) 40 MG tablet, Take 1  tablet (40 mg total) by mouth 2 (two) times daily before a meal., Disp: 60 tablet, Rfl: 5 .  phenytoin (DILANTIN) 100 MG ER capsule, Take 200 mg by mouth 2 (two) times daily. , Disp: , Rfl:  .  polyethylene glycol (MIRALAX / GLYCOLAX) packet, Take 17 g by mouth daily., Disp: , Rfl:    Social History: Reviewed -  reports that she has quit smoking. She has never used smokeless tobacco.  Objective Findings:  Vitals: Blood pressure 150/86, height 5' 5"  (1.651 m), weight 162 lb 8 oz (73.71 kg), last menstrual period 12/25/2014.  Physical Examination: General appearance - alert, well appearing, and in no distress and oriented to person, place, and time Mental status - alert, oriented to person, place, and time, normal mood, behavior, speech, dress, motor activity, and thought processes Pelvic - normal external genitalia, vulva, vagina, cervix, uterus and adnexa VULVA: normal appearing vulva with no masses, tenderness or lesions VAGINA: normal appearing vagina with normal color and discharge, no lesions CERVIX: normal appearing cervix without discharge or lesions UTERUS: uterus  is normal size, shape, consistency and nontender ADNEXA: normal adnexa in size, nontender and no masses   Assessment & Plan:   A:  1. Left ovarian cyst measuring 2.4 cm, per CT on 6/6 2. Resolved abnormal period with a clot 3 Patient anxiety resolved  P:  1. Pt reassured and has no further questions   This chart was scribed for [name], MD by Tula Nakayama, Medical Scribe. This patient was seen in room 3 and the patient's care was started at [place] [facility].   I personally performed the services described in this documentation, which was SCRIBED in my presence. The recorded information has been reviewed and considered accurate. It has been edited as necessary during review. [name], MD

## 2015-02-20 ENCOUNTER — Other Ambulatory Visit: Payer: Self-pay

## 2015-02-23 MED ORDER — LUBIPROSTONE 8 MCG PO CAPS: 8.0000 ug | ORAL_CAPSULE | Freq: Two times a day (BID) | ORAL | Status: DC

## 2015-02-25 DIAGNOSIS — M545 Low back pain: Secondary | ICD-10-CM | POA: Diagnosis not present

## 2015-02-25 DIAGNOSIS — G40309 Generalized idiopathic epilepsy and epileptic syndromes, not intractable, without status epilepticus: Secondary | ICD-10-CM | POA: Diagnosis not present

## 2015-02-25 DIAGNOSIS — I10 Essential (primary) hypertension: Secondary | ICD-10-CM | POA: Diagnosis not present

## 2015-02-25 DIAGNOSIS — J329 Chronic sinusitis, unspecified: Secondary | ICD-10-CM | POA: Diagnosis not present

## 2015-06-24 ENCOUNTER — Encounter: Payer: Self-pay | Admitting: Internal Medicine

## 2015-06-24 DIAGNOSIS — D649 Anemia, unspecified: Secondary | ICD-10-CM | POA: Diagnosis not present

## 2015-06-24 DIAGNOSIS — K210 Gastro-esophageal reflux disease with esophagitis: Secondary | ICD-10-CM | POA: Diagnosis not present

## 2015-06-24 DIAGNOSIS — M545 Low back pain: Secondary | ICD-10-CM | POA: Diagnosis not present

## 2015-06-24 DIAGNOSIS — I10 Essential (primary) hypertension: Secondary | ICD-10-CM | POA: Diagnosis not present

## 2015-07-06 DIAGNOSIS — Z1211 Encounter for screening for malignant neoplasm of colon: Secondary | ICD-10-CM | POA: Diagnosis not present

## 2015-07-16 ENCOUNTER — Ambulatory Visit: Payer: Self-pay | Admitting: Gastroenterology

## 2015-07-22 ENCOUNTER — Ambulatory Visit: Payer: Self-pay | Admitting: Nurse Practitioner

## 2015-07-22 ENCOUNTER — Encounter: Payer: Self-pay | Admitting: Nurse Practitioner

## 2015-07-22 ENCOUNTER — Telehealth: Payer: Self-pay | Admitting: Nurse Practitioner

## 2015-07-22 NOTE — Telephone Encounter (Signed)
Noted  

## 2015-07-22 NOTE — Telephone Encounter (Signed)
PATIENT WAS A NO SHOW AND LETTER SENT  °

## 2015-07-25 DIAGNOSIS — I10 Essential (primary) hypertension: Secondary | ICD-10-CM | POA: Diagnosis not present

## 2015-07-25 DIAGNOSIS — M545 Low back pain: Secondary | ICD-10-CM | POA: Diagnosis not present

## 2015-07-25 DIAGNOSIS — D649 Anemia, unspecified: Secondary | ICD-10-CM | POA: Diagnosis not present

## 2015-07-25 DIAGNOSIS — K210 Gastro-esophageal reflux disease with esophagitis: Secondary | ICD-10-CM | POA: Diagnosis not present

## 2015-08-03 ENCOUNTER — Other Ambulatory Visit: Payer: Self-pay | Admitting: Nurse Practitioner

## 2015-08-24 ENCOUNTER — Other Ambulatory Visit: Payer: Self-pay

## 2015-08-24 ENCOUNTER — Ambulatory Visit (INDEPENDENT_AMBULATORY_CARE_PROVIDER_SITE_OTHER): Payer: Medicare Other | Admitting: Nurse Practitioner

## 2015-08-24 ENCOUNTER — Encounter: Payer: Self-pay | Admitting: Nurse Practitioner

## 2015-08-24 VITALS — BP 128/87 | HR 98 | Temp 99.2°F | Ht 66.0 in | Wt 160.4 lb

## 2015-08-24 DIAGNOSIS — K219 Gastro-esophageal reflux disease without esophagitis: Secondary | ICD-10-CM

## 2015-08-24 DIAGNOSIS — R1314 Dysphagia, pharyngoesophageal phase: Secondary | ICD-10-CM

## 2015-08-24 DIAGNOSIS — R1310 Dysphagia, unspecified: Secondary | ICD-10-CM

## 2015-08-24 DIAGNOSIS — R1319 Other dysphagia: Secondary | ICD-10-CM

## 2015-08-24 MED ORDER — RABEPRAZOLE SODIUM 20 MG PO TBEC: 20.0000 mg | DELAYED_RELEASE_TABLET | Freq: Every day | ORAL | Status: DC

## 2015-08-24 NOTE — Progress Notes (Signed)
Referring Provider: Sinda Du, MD Primary Care Physician:  Alonza Bogus, MD Primary GI:  Dr.   Chief Complaint  Patient presents with  . Dysphagia    HPI:   [name] is a 45 y.o. female who presents On referral from primary care for evaluation of dysphagia. Last seen in our office 08/28/2014 for abdominal pain and constipation. Last seen related to dysphagia 02/22/2013 at which point she noted one month difficulty swallowing solid foods and pills with noted regurgitation. She was referred for endoscopy which is completed on 03/20/2013 which noted normal, patent appearing tubular esophagus status post dilation with a 54 [place] Maloney dilator. Biopsies of distal esophagus were taken. Impression of normal EGD status post dilation. Recommended stop omeprazole, start Dexilant daily. Esophageal biopsy found unremarkable squamous mucosa with no features of eosinophilic esophagitis, fungi, dysplasia, or malignancy.  Today she states she had significant improvement in dysphagia after last dilation. Began having recurrent symptoms about 2 months ago. Has dysphagia with solid foods daily as well as pill dysphagia in the evening. Makes concerted effort to chew food well. Keeps fluid on hand. Occasional regurgitation. Denies odynophagia. Does have mid-esophageal pain about an hour or two after taking pills. Is currently on Protonix which is not very effective, notes breakthrough daily. Dexilant was not effective symptomatically either. Has also tried/failed omeprazole. Occasional LLQ pain. Occasional N/V. Denies hematochezia, melena. Has had hemoccult stool test which was negative. Denies chest pain, dyspnea, dizziness, lightheadedness, syncope, near syncope. Denies any other upper or lower GI symptoms.  Takes Percocet and Xanax both about 1-2 times a week.  Past Medical History  Diagnosis Date  . Seizures ([place])   . HTN (hypertension)   . Panic attacks   . Anxiety   . Polyuria      Past Surgical History  Procedure Laterality Date  . Back surgery      complicated by ureteral injury  . Esophagogastroduodenoscopy (egd) with esophageal dilation N/A 03/20/2013    Dr. Rourk:Normal EGD-status post passage of a Maloney dilator/Status post esophageal biopsy., benign path    Current Outpatient Prescriptions  Medication Sig Dispense Refill  . ALPRAZolam (XANAX) 0.5 MG tablet Take 0.5 mg by mouth 2 (two) times daily as needed.   5  . amLODipine (NORVASC) 10 MG tablet Take 10 mg by mouth daily.    . irbesartan (AVAPRO) 300 MG tablet Take 300 mg by mouth daily.  12  . lubiprostone (AMITIZA) 8 MCG capsule Take 1 capsule (8 mcg total) by mouth 2 (two) times daily with a meal. 60 capsule 5  . oxybutynin (DITROPAN-XL) 10 MG 24 hr tablet Take 10 mg by mouth 2 (two) times daily.    Marland Kitchen oxyCODONE-acetaminophen (PERCOCET/ROXICET) 5-325 MG per tablet Take 1 tablet by mouth every 6 (six) hours as needed. pain    . pantoprazole (PROTONIX) 40 MG tablet TAKE 1 TABLET (40 MG TOTAL) BY MOUTH 2 (TWO) TIMES DAILY BEFORE A MEAL. 60 tablet 5  . phenytoin (DILANTIN) 100 MG ER capsule Take 200 mg by mouth 2 (two) times daily.     . polyethylene glycol (MIRALAX / GLYCOLAX) packet Take 17 g by mouth daily.    . RABEprazole (ACIPHEX) 20 MG tablet Take 1 tablet (20 mg total) by mouth daily. 30 tablet 2   No current facility-administered medications for this visit.    Allergies as of 08/24/2015 - Review Complete 08/24/2015  Allergen Reaction Noted  . Benadryl [diphenhydramine hcl] Rash 01/17/2012    Family  History  Problem Relation Age of Onset  . Throat cancer Father   . Heart disease Father   . Colon cancer Neg Hx   . Liver disease Neg Hx   . Pancreatic cancer Neg Hx   . Other Mother     esophageal stricture and gallbladder disease  . Other Sister     esophageal stricture and gallbladder problems.   . Diabetes Sister   . Heart disease Maternal Grandfather   . Diabetes Maternal  Grandfather   . Arthritis Maternal Grandfather     Social History   Social History  . Marital Status: Single    Spouse Name: N/A  . Number of Children: N/A  . Years of Education: 12   Occupational History  . disability    Social History Main Topics  . Smoking status: Former Smoker    Quit date: 04/04/1994  . Smokeless tobacco: Never Used     Comment: smoked 8 years, quit 18 years ago in the 1996  . Alcohol Use: No  . Drug Use: No  . Sexual Activity: Not Currently    Birth Control/ Protection: None   Other Topics Concern  . None   Social History Narrative    Review of Systems: General: Negative for anorexia, weight loss, fever, chills, fatigue, weakness. ENT: Negative for hoarseness. CV: Negative for chest pain, angina, palpitations, peripheral edema.  Respiratory: Negative for dyspnea at rest, cough, sputum, wheezing.  GI: See history of present illness. Endo: Negative for unusual weight change.  Heme: Negative for bruising or bleeding.  Physical Exam: BP 128/87 mmHg  Pulse 98  Temp(Src) 99.2 F (37.3 C) (Oral)  Ht 5' 6"  (1.676 m)  Wt 160 lb 6.4 oz (72.757 kg)  BMI 25.90 kg/m2 General:   Alert and oriented. Pleasant and cooperative. Well-nourished and well-developed.  Head:  Normocephalic and atraumatic. Eyes:  Without icterus, sclera clear and conjunctiva pink.  Ears:  Normal auditory acuity. Cardiovascular:  S1, S2 present without murmurs appreciated. Extremities without clubbing or edema. Respiratory:  Clear to auscultation bilaterally. No wheezes, rales, or rhonchi. No distress.  Gastrointestinal:  +BS, soft, and non-distended. Mild epigastric TTP noted. No HSM noted. No guarding or rebound. No masses appreciated.  Rectal:  Deferred  Musculoskalatal:  Symmetrical without gross deformities. Neurologic:  Alert and oriented x4;  grossly normal neurologically. Psych:  Alert and cooperative. Normal mood and affect. Heme/Lymph/Immune: No excessive bruising  noted.    08/24/2015 10:39 AM   Disclaimer: This note was dictated with voice recognition software. Similar sounding words can inadvertently be transcribed and may not be corrected upon review.

## 2015-08-24 NOTE — Patient Instructions (Signed)
1. Stop taking Protonix. 2. I sent in a prescription for AcipHex 20 mg to your pharmacy. Take it once a day, 30 minutes before your first meal the day. 3. We will schedule your procedure for you. 4. Return for follow-up in 3 months.

## 2015-08-24 NOTE — Assessment & Plan Note (Signed)
GERD symptoms not well controlled. Has tried Prilosec, Protonix, Dexilant and failed adequate symptomatic relief. At this point I'll have her trial AcipHex on 20 mg daily. Endoscopy as noted above. Return for postprocedure follow-up in 3 months.

## 2015-08-24 NOTE — Assessment & Plan Note (Signed)
Recurrent solid food and pill dysphagia. Last EGD/dilation 2014 with symptomatic relief post-procedure. Has symptoms daily. Also with uncontrolled GERD which is likely contributing to her symptoms. Does have mid esophageal pain sometime after taking pills with pill dysphagia likely due to pill injury or prolonged pill transit. At this point she is failed Prilosec, Protonix, Dexilant. I will start her on AcipHex 20 mg daily. We'll set her up for an endoscopy with possible dilation. Return for postprocedure follow-up in 3 months.  Proceed with EGD +/- dilation with 12.5 mg pre-procedure Phenergan with Dr. Gala Romney in near future: the risks, benefits, and alternatives have been discussed with the patient in detail. The patient states understanding and desires to proceed.  The patient is on Xanax when necessary about once or twice a week, Percocet when necessary about once twice a week. She is not on any other anticoagulants, anxiolytics, chronic pain medications. We will add 25 mg preprocedure Phenergan to promote adequate sedation. Her last procedure was completed on conscious sedation and 25 mg Phenergan without any complications.

## 2015-08-24 NOTE — Progress Notes (Signed)
CC'D TO PCP °

## 2015-08-25 ENCOUNTER — Telehealth: Payer: Self-pay | Admitting: Internal Medicine

## 2015-08-25 NOTE — Telephone Encounter (Signed)
[phone]   PATIENT CALLED AND STATED THAT HER INSURANCE WILL NOT COVER THE MEDICATION CALLED IN YESTERDAY FOR HER

## 2015-08-26 NOTE — Telephone Encounter (Signed)
PA is done, waiting for insurance to respond.

## 2015-08-26 NOTE — Telephone Encounter (Signed)
PA has been approved and faxed to the pharmacy

## 2015-08-27 NOTE — Telephone Encounter (Signed)
Tried to call pt- [id number]-LMOM

## 2015-09-11 ENCOUNTER — Ambulatory Visit (HOSPITAL_COMMUNITY)
Admission: RE | Admit: 2015-09-11 | Discharge: 2015-09-11 | Disposition: A | Discharge status: Home / Self Care | Payer: Medicare Other | Source: Ambulatory Visit | Attending: Internal Medicine | Admitting: Internal Medicine

## 2015-09-11 ENCOUNTER — Encounter (HOSPITAL_COMMUNITY): Payer: Self-pay | Admitting: *Deleted

## 2015-09-11 ENCOUNTER — Encounter (HOSPITAL_COMMUNITY)
Admission: RE | Disposition: A | Discharge status: Home / Self Care | Payer: Self-pay | Source: Ambulatory Visit | Attending: Internal Medicine

## 2015-09-11 DIAGNOSIS — K219 Gastro-esophageal reflux disease without esophagitis: Secondary | ICD-10-CM | POA: Diagnosis not present

## 2015-09-11 DIAGNOSIS — R1013 Epigastric pain: Secondary | ICD-10-CM | POA: Diagnosis present

## 2015-09-11 DIAGNOSIS — Z87891 Personal history of nicotine dependence: Secondary | ICD-10-CM | POA: Diagnosis not present

## 2015-09-11 DIAGNOSIS — Z79899 Other long term (current) drug therapy: Secondary | ICD-10-CM | POA: Diagnosis not present

## 2015-09-11 DIAGNOSIS — K317 Polyp of stomach and duodenum: Secondary | ICD-10-CM | POA: Insufficient documentation

## 2015-09-11 DIAGNOSIS — F419 Anxiety disorder, unspecified: Secondary | ICD-10-CM | POA: Insufficient documentation

## 2015-09-11 DIAGNOSIS — F410 Panic disorder [episodic paroxysmal anxiety] without agoraphobia: Secondary | ICD-10-CM | POA: Diagnosis not present

## 2015-09-11 DIAGNOSIS — K30 Functional dyspepsia: Secondary | ICD-10-CM | POA: Insufficient documentation

## 2015-09-11 DIAGNOSIS — R1310 Dysphagia, unspecified: Secondary | ICD-10-CM | POA: Diagnosis not present

## 2015-09-11 DIAGNOSIS — I10 Essential (primary) hypertension: Secondary | ICD-10-CM | POA: Diagnosis not present

## 2015-09-11 HISTORY — DX: Gastro-esophageal reflux disease without esophagitis: K21.9

## 2015-09-11 HISTORY — PX: ESOPHAGOGASTRODUODENOSCOPY: SHX5428

## 2015-09-11 HISTORY — PX: MALONEY DILATION: SHX5535

## 2015-09-11 SURGERY — EGD (ESOPHAGOGASTRODUODENOSCOPY)
Anesthesia: Moderate Sedation

## 2015-09-11 MED ORDER — MEPERIDINE HCL 100 MG/ML IJ SOLN
INTRAMUSCULAR | Status: DC | PRN
  Administered 2015-09-11 (×2): 25 mg via INTRAVENOUS
  Administered 2015-09-11: 50 mg via INTRAVENOUS

## 2015-09-11 MED ORDER — ONDANSETRON HCL 4 MG/2ML IJ SOLN
INTRAMUSCULAR | Status: DC | PRN
Start: 2015-09-11 — End: 2015-09-11
  Administered 2015-09-11: 4 mg via INTRAVENOUS

## 2015-09-11 MED ORDER — MEPERIDINE HCL 100 MG/ML IJ SOLN
INTRAMUSCULAR | Status: AC
  Filled 2015-09-11: qty 2

## 2015-09-11 MED ORDER — LIDOCAINE VISCOUS 2 % MT SOLN
OROMUCOSAL | Status: AC
  Filled 2015-09-11: qty 15

## 2015-09-11 MED ORDER — MIDAZOLAM HCL 5 MG/5ML IJ SOLN
INTRAMUSCULAR | Status: DC | PRN
  Administered 2015-09-11: 2 mg via INTRAVENOUS
  Administered 2015-09-11 (×2): 1 mg via INTRAVENOUS

## 2015-09-11 MED ORDER — ONDANSETRON HCL 4 MG/2ML IJ SOLN
INTRAMUSCULAR | Status: AC
  Filled 2015-09-11: qty 2

## 2015-09-11 MED ORDER — MIDAZOLAM HCL 5 MG/5ML IJ SOLN
INTRAMUSCULAR | Status: AC
  Filled 2015-09-11: qty 10

## 2015-09-11 MED ORDER — SODIUM CHLORIDE 0.9 % IV SOLN
INTRAVENOUS | Status: DC
  Administered 2015-09-11: 07:00:00 via INTRAVENOUS

## 2015-09-11 MED ORDER — LIDOCAINE VISCOUS 2 % MT SOLN
OROMUCOSAL | Status: DC | PRN
  Administered 2015-09-11: 1 via OROMUCOSAL

## 2015-09-11 MED ORDER — SIMETHICONE 40 MG/0.6ML PO SUSP
ORAL | Status: DC | PRN
  Administered 2015-09-11: 2.5 mL

## 2015-09-11 NOTE — Discharge Instructions (Signed)
EGD Discharge instructions Please read the instructions outlined below and refer to this sheet in the next few weeks. These discharge instructions provide you with general information on caring for yourself after you leave the hospital. Your doctor may also give you specific instructions. While your treatment has been planned according to the most current medical practices available, unavoidable complications occasionally occur. If you have any problems or questions after discharge, please call your doctor. ACTIVITY  You may resume your regular activity but move at a slower pace for the next 24 hours.   Take frequent rest periods for the next 24 hours.   Walking will help expel (get rid of) the air and reduce the bloated feeling in your abdomen.   No driving for 24 hours (because of the anesthesia (medicine) used during the test).   You may shower.   Do not sign any important legal documents or operate any machinery for 24 hours (because of the anesthesia used during the test).  NUTRITION  Drink plenty of fluids.   You may resume your normal diet.   Begin with a light meal and progress to your normal diet.   Avoid alcoholic beverages for 24 hours or as instructed by your caregiver.  MEDICATIONS  You may resume your normal medications unless your caregiver tells you otherwise.  WHAT YOU CAN EXPECT TODAY  You may experience abdominal discomfort such as a feeling of fullness or gas pains.  FOLLOW-UP  Your doctor will discuss the results of your test with you.  SEEK IMMEDIATE MEDICAL ATTENTION IF ANY OF THE FOLLOWING OCCUR:  Excessive nausea (feeling sick to your stomach) and/or vomiting.   Severe abdominal pain and distention (swelling).   Trouble swallowing.   Temperature over 101 F (37.8 C).   Rectal bleeding or vomiting of blood.    GERD information performed  Stop Aciphex;  Try Carafate suspension as directed for symptoms  Office visit with [place] in 2  months  Ask Dr. Luan Pulling about getting a sleep study next time you see him (you had loud snoring during the procedure-want to check for sleep apnea)  Further recommendations to follow pending review of pathology report

## 2015-09-11 NOTE — Interval H&P Note (Signed)
History and Physical Interval Note:  09/11/2015 7:41 AM  [name]  has presented today for surgery, with the diagnosis of GERD, dysphagia  The various methods of treatment have been discussed with the patient and family. After consideration of risks, benefits and other options for treatment, the patient has consented to  Procedure(s) with comments: ESOPHAGOGASTRODUODENOSCOPY (EGD) (N/A) - 0730 MALONEY DILATION (N/A) as a surgical intervention .  The patient's history has been reviewed, patient examined, no change in status, stable for surgery.  I have reviewed the patient's chart and labs.  Questions were answered to the patient's satisfaction.     Joedy Eickhoff  No change; EGD/ED per plaqn. The risks, benefits, limitations, alternatives and imponderables have been reviewed with the patient. Potential for esophageal dilation, biopsy, etc. have also been reviewed.  Questions have been answered. All parties agreeable.

## 2015-09-11 NOTE — H&P (View-Only) (Signed)
Referring Provider: Sinda Du, MD Primary Care Physician:  Alonza Bogus, MD Primary GI:  Dr.   Chief Complaint  Patient presents with  . Dysphagia    HPI:   [name] is a 45 y.o. female who presents On referral from primary care for evaluation of dysphagia. Last seen in our office 08/28/2014 for abdominal pain and constipation. Last seen related to dysphagia 02/22/2013 at which point she noted one month difficulty swallowing solid foods and pills with noted regurgitation. She was referred for endoscopy which is completed on 03/20/2013 which noted normal, patent appearing tubular esophagus status post dilation with a 54 [place] Maloney dilator. Biopsies of distal esophagus were taken. Impression of normal EGD status post dilation. Recommended stop omeprazole, start Dexilant daily. Esophageal biopsy found unremarkable squamous mucosa with no features of eosinophilic esophagitis, fungi, dysplasia, or malignancy.  Today she states she had significant improvement in dysphagia after last dilation. Began having recurrent symptoms about 2 months ago. Has dysphagia with solid foods daily as well as pill dysphagia in the evening. Makes concerted effort to chew food well. Keeps fluid on hand. Occasional regurgitation. Denies odynophagia. Does have mid-esophageal pain about an hour or two after taking pills. Is currently on Protonix which is not very effective, notes breakthrough daily. Dexilant was not effective symptomatically either. Has also tried/failed omeprazole. Occasional LLQ pain. Occasional N/V. Denies hematochezia, melena. Has had hemoccult stool test which was negative. Denies chest pain, dyspnea, dizziness, lightheadedness, syncope, near syncope. Denies any other upper or lower GI symptoms.  Takes Percocet and Xanax both about 1-2 times a week.  Past Medical History  Diagnosis Date  . Seizures ([place])   . HTN (hypertension)   . Panic attacks   . Anxiety   . Polyuria      Past Surgical History  Procedure Laterality Date  . Back surgery      complicated by ureteral injury  . Esophagogastroduodenoscopy (egd) with esophageal dilation N/A 03/20/2013    Dr. Rourk:Normal EGD-status post passage of a Maloney dilator/Status post esophageal biopsy., benign path    Current Outpatient Prescriptions  Medication Sig Dispense Refill  . ALPRAZolam (XANAX) 0.5 MG tablet Take 0.5 mg by mouth 2 (two) times daily as needed.   5  . amLODipine (NORVASC) 10 MG tablet Take 10 mg by mouth daily.    . irbesartan (AVAPRO) 300 MG tablet Take 300 mg by mouth daily.  12  . lubiprostone (AMITIZA) 8 MCG capsule Take 1 capsule (8 mcg total) by mouth 2 (two) times daily with a meal. 60 capsule 5  . oxybutynin (DITROPAN-XL) 10 MG 24 hr tablet Take 10 mg by mouth 2 (two) times daily.    Marland Kitchen oxyCODONE-acetaminophen (PERCOCET/ROXICET) 5-325 MG per tablet Take 1 tablet by mouth every 6 (six) hours as needed. pain    . pantoprazole (PROTONIX) 40 MG tablet TAKE 1 TABLET (40 MG TOTAL) BY MOUTH 2 (TWO) TIMES DAILY BEFORE A MEAL. 60 tablet 5  . phenytoin (DILANTIN) 100 MG ER capsule Take 200 mg by mouth 2 (two) times daily.     . polyethylene glycol (MIRALAX / GLYCOLAX) packet Take 17 g by mouth daily.    . RABEprazole (ACIPHEX) 20 MG tablet Take 1 tablet (20 mg total) by mouth daily. 30 tablet 2   No current facility-administered medications for this visit.    Allergies as of 08/24/2015 - Review Complete 08/24/2015  Allergen Reaction Noted  . Benadryl [diphenhydramine hcl] Rash 01/17/2012    Family  History  Problem Relation Age of Onset  . Throat cancer Father   . Heart disease Father   . Colon cancer Neg Hx   . Liver disease Neg Hx   . Pancreatic cancer Neg Hx   . Other Mother     esophageal stricture and gallbladder disease  . Other Sister     esophageal stricture and gallbladder problems.   . Diabetes Sister   . Heart disease Maternal Grandfather   . Diabetes Maternal  Grandfather   . Arthritis Maternal Grandfather     Social History   Social History  . Marital Status: Single    Spouse Name: N/A  . Number of Children: N/A  . Years of Education: 12   Occupational History  . disability    Social History Main Topics  . Smoking status: Former Smoker    Quit date: 04/04/1994  . Smokeless tobacco: Never Used     Comment: smoked 8 years, quit 18 years ago in the 1996  . Alcohol Use: No  . Drug Use: No  . Sexual Activity: Not Currently    Birth Control/ Protection: None   Other Topics Concern  . None   Social History Narrative    Review of Systems: General: Negative for anorexia, weight loss, fever, chills, fatigue, weakness. ENT: Negative for hoarseness. CV: Negative for chest pain, angina, palpitations, peripheral edema.  Respiratory: Negative for dyspnea at rest, cough, sputum, wheezing.  GI: See history of present illness. Endo: Negative for unusual weight change.  Heme: Negative for bruising or bleeding.  Physical Exam: BP 128/87 mmHg  Pulse 98  Temp(Src) 99.2 F (37.3 C) (Oral)  Ht 5' 6"  (1.676 m)  Wt 160 lb 6.4 oz (72.757 kg)  BMI 25.90 kg/m2 General:   Alert and oriented. Pleasant and cooperative. Well-nourished and well-developed.  Head:  Normocephalic and atraumatic. Eyes:  Without icterus, sclera clear and conjunctiva pink.  Ears:  Normal auditory acuity. Cardiovascular:  S1, S2 present without murmurs appreciated. Extremities without clubbing or edema. Respiratory:  Clear to auscultation bilaterally. No wheezes, rales, or rhonchi. No distress.  Gastrointestinal:  +BS, soft, and non-distended. Mild epigastric TTP noted. No HSM noted. No guarding or rebound. No masses appreciated.  Rectal:  Deferred  Musculoskalatal:  Symmetrical without gross deformities. Neurologic:  Alert and oriented x4;  grossly normal neurologically. Psych:  Alert and cooperative. Normal mood and affect. Heme/Lymph/Immune: No excessive bruising  noted.    08/24/2015 10:39 AM   Disclaimer: This note was dictated with voice recognition software. Similar sounding words can inadvertently be transcribed and may not be corrected upon review.

## 2015-09-11 NOTE — Op Note (Signed)
[facility] Patient Name: [name] Procedure Date: 09/11/2015 7:15 AM MRN: [id number] Date of Birth: April 29, 1970 Attending MD: Norvel Richards , MD CSN: [id number] Age: 45 Admit Type: Outpatient Procedure:                Upper GI endoscopy with [facility] dilation and                            gastric biopsy Indications:              Dyspepsia; GERD; dysphagia; patient related nausea                            with rabeprazole; patient intolerant or experienced                            lack of efficacy with essentially all PPIs.:                            Gallbladder workup with ultrasound and HIDA                            negative. Providers:                Norvel Richards, MD, Lurline Del, RN, Randa Spike, Technician Referring MD:              Medicines:                Midazolam 3 mg IV, Meperidine 100 mg IV,                            Ondansetron 4 mg IV Complications:            No immediate complications. Estimated Blood Loss:     Estimated blood loss was minimal. Procedure:                Pre-Anesthesia Assessment:                           - Prior to the procedure, a History and Physical                            was performed, and patient medications and                            allergies were reviewed. The patient's tolerance of                            previous anesthesia was also reviewed. The risks                            and benefits of the procedure and the sedation                            options and  risks were discussed with the patient.                            All questions were answered, and informed consent                            was obtained. Prior Anticoagulants: The patient has                            taken no previous anticoagulant or antiplatelet                            agents. ASA Grade Assessment: II - A patient with                            mild systemic disease. After reviewing  the risks                            and benefits, the patient was deemed in                            satisfactory condition to undergo the procedure.                           - Prior to the procedure, a History and Physical                            was performed, and patient medications and                            allergies were reviewed. The patient's tolerance of                            previous anesthesia was also reviewed. The risks                            and benefits of the procedure and the sedation                            options and risks were discussed with the patient.                            All questions were answered, and informed consent                            was obtained. ASA Grade Assessment: II - A patient                            with mild systemic disease. After reviewing the                            risks and benefits, the patient was deemed in  satisfactory condition to undergo the procedure.                           After obtaining informed consent, the endoscope was                            passed under direct vision. Throughout the                            procedure, the patient's blood pressure, pulse, and                            oxygen saturations were monitored continuously. The                            EG-[id number] ([id number]) scope was introduced through the                            mouth, and advanced to the second part of duodenum.                            The upper GI endoscopy was accomplished without                            difficulty. The patient tolerated the procedure                            well. Scope In: 7:54:52 AM Scope Out: 8:00:43 AM Total Procedure Duration: 0 hours 5 minutes 51 seconds  Findings:      The examined esophagus was normal. Patent throughout its course. Stomach       empty. Small hiatal hernia present. The scope was withdrawn.      A few 3 mm pedunculated and  sessile polyps with no stigmata of recent       bleeding were found in the stomach. Patent pylorus. Normal-appearing       first and second portion of the duodenum. A 54 [place] Maloney dilator       was passed to full insertion. It was passed without resistance. A look       back revealed no apparent complication related to this maneuver. The       polyp was removed with a cold biopsy forceps. Resection and retrieval       were complete. Estimated blood loss was minimal. Impression:               .                           - Normal esophagus. Dilated.                           - A few gastric polyps. Resected and retrieved.                           - Normal second portion of the duodenum. Moderate Sedation:      Moderate (conscious) sedation was administered by the endoscopy nurse  and supervised by the endoscopist. The following parameters were       monitored: oxygen saturation, heart rate, blood pressure, respiratory       rate, EKG, adequacy of pulmonary ventilation, and response to care.       Total physician intraservice time was 17 minutes. Recommendation:           - Patient has a contact number available for                            emergencies. The signs and symptoms of potential                            delayed complications were discussed with the                            patient. Return to normal activities tomorrow.                            Written discharge instructions were provided to the                            patient.                           - Advance diet as tolerated.                           - Continue present medications9 (except stop                            rabeprazole). Begin Carafate 1 g 4 times daily..                           - Await pathology results.                           - No repeat upper endoscopy.                           - Return to GI office in 8 weeks. Procedure Code(s):        --- Professional ---                            [phone], Esophagogastroduodenoscopy, flexible,                            transoral; with biopsy, single or multiple                           43450, Dilation of esophagus, by unguided sound or                            bougie, single or multiple passes                           99152, Moderate sedation services provided by the  same physician or other qualified health care                            professional performing the diagnostic or                            therapeutic service that the sedation supports,                            requiring the presence of an independent trained                            observer to assist in the monitoring of the                            patient's level of consciousness and physiological                            status; initial 15 minutes of intraservice time,                            patient age 104 years or older Diagnosis Code(s):        --- Professional ---                           K31.7, Polyp of stomach and duodenum                           K30, Functional dyspepsia CPT copyright 2016 American Medical Association. All rights reserved. The codes documented in this report are preliminary and upon coder review may  be revised to meet current compliance requirements. [name] Eliannah Hinde, MD Norvel Richards, MD 09/11/2015 8:27:44 AM This report has been signed electronically. Number of Addenda: 0

## 2015-09-14 ENCOUNTER — Other Ambulatory Visit: Payer: Self-pay

## 2015-09-15 ENCOUNTER — Encounter: Payer: Self-pay | Admitting: Internal Medicine

## 2015-09-16 ENCOUNTER — Encounter (HOSPITAL_COMMUNITY): Payer: Self-pay | Admitting: Internal Medicine

## 2015-09-16 ENCOUNTER — Other Ambulatory Visit: Payer: Self-pay | Admitting: Nurse Practitioner

## 2015-09-25 DIAGNOSIS — G4733 Obstructive sleep apnea (adult) (pediatric): Secondary | ICD-10-CM | POA: Diagnosis not present

## 2015-09-25 DIAGNOSIS — N3281 Overactive bladder: Secondary | ICD-10-CM | POA: Diagnosis not present

## 2015-09-25 DIAGNOSIS — I10 Essential (primary) hypertension: Secondary | ICD-10-CM | POA: Diagnosis not present

## 2015-09-25 DIAGNOSIS — K210 Gastro-esophageal reflux disease with esophagitis: Secondary | ICD-10-CM | POA: Diagnosis not present

## 2015-09-30 ENCOUNTER — Other Ambulatory Visit (HOSPITAL_COMMUNITY): Payer: Self-pay | Admitting: Respiratory Therapy

## 2015-09-30 DIAGNOSIS — G4730 Sleep apnea, unspecified: Secondary | ICD-10-CM

## 2015-10-01 ENCOUNTER — Telehealth: Payer: Self-pay

## 2015-10-01 NOTE — Telephone Encounter (Signed)
Pt called- left a voicemail, she said RMR gave her carafate after her procedure. She said the carafate isnt working either and she wants to know if there is something else she can try?

## 2015-10-01 NOTE — Telephone Encounter (Signed)
What are her symptoms? Is she taking Pepcid, Zantac, or anything like that? It looks like she has had issues with all PPIs.

## 2015-10-01 NOTE — Telephone Encounter (Signed)
Sounds good

## 2015-10-01 NOTE — Telephone Encounter (Signed)
Spoke with the pt, she said the carafate works for a little while but then the indigestion comes back, she had stopped her aciphex when she started the carafate. She is going to try taking her aciphex again and then she will call back and let us know how she is doing.

## 2015-10-28 ENCOUNTER — Ambulatory Visit: Payer: Medicare Other | Attending: Pulmonary Disease | Admitting: Neurology

## 2015-10-28 DIAGNOSIS — G4761 Periodic limb movement disorder: Secondary | ICD-10-CM | POA: Insufficient documentation

## 2015-10-28 DIAGNOSIS — G4733 Obstructive sleep apnea (adult) (pediatric): Secondary | ICD-10-CM | POA: Diagnosis not present

## 2015-10-28 DIAGNOSIS — Z79899 Other long term (current) drug therapy: Secondary | ICD-10-CM | POA: Diagnosis not present

## 2015-10-28 DIAGNOSIS — G4730 Sleep apnea, unspecified: Secondary | ICD-10-CM

## 2015-10-31 NOTE — Procedures (Signed)
[place] A. Merlene Laughter, MD     [email or web address]             NOCTURNAL POLYSOMNOGRAPHY   LOCATION: [facility]   Patient Name: [name], [name] Date: 10/28/2015 Gender: Female D.O.B: 16-Aug-1970 Age (years): 33 Referring Provider: Not Available Height (inches): 66 Interpreting Physician: Phillips Odor MD, ABSM Weight (lbs): 160 RPSGT: Rosebud Poles BMI: 26 MRN: Neck Size: 16.50 CLINICAL INFORMATION The patient is referred for a split night study with BPAP. MEDICATIONS Medications taken by the patient : N/A  Medications administered by patient during sleep study : No sleep medicine administered.  Current Outpatient Prescriptions:  .  ALPRAZolam (XANAX) 0.5 MG tablet, Take 0.5 mg by mouth 2 (two) times daily as needed. , Disp: , Rfl: 5 .  AMITIZA 8 MCG capsule, TAKE 1 CAPSULE (8 MCG TOTAL) BY MOUTH 2 (TWO) TIMES DAILY WITH A MEAL., Disp: 60 capsule, Rfl: 5 .  amLODipine (NORVASC) 10 MG tablet, Take 10 mg by mouth daily., Disp: , Rfl:  .  ferrous sulfate 325 (65 FE) MG tablet, Take 325 mg by mouth daily with breakfast., Disp: , Rfl:  .  irbesartan (AVAPRO) 300 MG tablet, Take 300 mg by mouth daily., Disp: , Rfl: 12 .  Multiple Vitamins-Minerals (MULTIVITAMIN PO), Take 1 tablet by mouth daily., Disp: , Rfl:  .  oxybutynin (DITROPAN-XL) 10 MG 24 hr tablet, Take 10 mg by mouth 2 (two) times daily., Disp: , Rfl:  .  oxyCODONE-acetaminophen (PERCOCET/ROXICET) 5-325 MG per tablet, Take 1 tablet by mouth every 6 (six) hours as needed. pain, Disp: , Rfl:  .  phenytoin (DILANTIN) 100 MG ER capsule, Take 200 mg by mouth 2 (two) times daily. , Disp: , Rfl:  .  polyethylene glycol (MIRALAX / GLYCOLAX) packet, Take 17 g by mouth daily., Disp: , Rfl:  .  RABEprazole (ACIPHEX) 20 MG tablet, Take 1 tablet (20 mg total) by mouth daily., Disp: 30 tablet, Rfl: 2   SLEEP STUDY TECHNIQUE As per the AASM Manual for the Scoring of Sleep and Associated Events v2.3 (April  2016) with a hypopnea requiring 4% desaturations. The channels recorded and monitored were frontal, central and occipital EEG, electrooculogram (EOG), submentalis EMG (chin), nasal and oral airflow, thoracic and abdominal wall motion, anterior tibialis EMG, snore microphone, electrocardiogram, and pulse oximetry. Bi-level positive airway pressure (BiPAP) was initiated when the patient met split night criteria and was titrated according to treat sleep-disordered breathing. RESPIRATORY PARAMETERS Diagnostic Total AHI (/hr): 48.5 RDI (/hr): 48.5 OA Index (/hr): 23.6 CA Index (/hr): 0.0 REM AHI (/hr): 50.0 NREM AHI (/hr): 48.3 Supine AHI (/hr): 86.4 Non-supine AHI (/hr): 6.04 Min O2 Sat (%): 77.00 Mean O2 (%): 93.90 Time below 88% (min): 6.2     Titration: The patient was titrated between 5 - 7. Optimal IPAP Pressure (cm): 7 Optimal EPAP Pressure (cm): 7 AHI at Optimal Pressure (/hr): 0 Min O2 at Optimal Pressure (%): 91.00 Sleep % at Optimal (%): N/A Supine % at Optimal (%): N/A         SLEEP ARCHITECTURE The study was initiated at 10:44:52 PM and terminated at 4:34:32 AM. The total recorded time was 349.7 minutes. EEG confirmed total sleep time was 284.9 minutes yielding a sleep efficiency of 81.5%. Sleep onset after lights out was 11.8 minutes with a REM latency of 139.0 minutes. The patient spent 6.85% of the night in stage N1 sleep, 47.69% in stage N2 sleep, 30.72% in stage N3 and 14.74% in REM. Wake after  sleep onset (WASO) was 53.0 minutes. The Arousal Index was 24.2/hour. LEG MOVEMENT DATA The total Periodic Limb Movements of Sleep (PLMS) were 137. The PLMS index was 28.86 . CARDIAC DATA The 2 lead EKG demonstrated sinus rhythm. The mean heart rate was N/A beats per minute. Other EKG findings include: None.  IMPRESSIONS - Severe obstructive sleep apnea occurred during the diagnostic portion of the study (AHI = 48.5 /hour). Optimal ?CPAP 7. - Severe periodic limb movements of sleep occurred  during the study.    Delano Metz, MD Diplomate, American Board of Sleep Medicine.

## 2015-11-11 ENCOUNTER — Encounter: Payer: Self-pay | Admitting: Nurse Practitioner

## 2015-11-11 ENCOUNTER — Ambulatory Visit (INDEPENDENT_AMBULATORY_CARE_PROVIDER_SITE_OTHER): Payer: Medicare Other | Admitting: Nurse Practitioner

## 2015-11-11 VITALS — BP 125/87 | HR 82 | Temp 97.9°F | Ht 66.0 in | Wt 165.8 lb

## 2015-11-11 DIAGNOSIS — R1314 Dysphagia, pharyngoesophageal phase: Secondary | ICD-10-CM | POA: Diagnosis not present

## 2015-11-11 DIAGNOSIS — R1319 Other dysphagia: Secondary | ICD-10-CM

## 2015-11-11 DIAGNOSIS — K219 Gastro-esophageal reflux disease without esophagitis: Secondary | ICD-10-CM

## 2015-11-11 DIAGNOSIS — R1310 Dysphagia, unspecified: Secondary | ICD-10-CM

## 2015-11-11 DIAGNOSIS — K317 Polyp of stomach and duodenum: Secondary | ICD-10-CM

## 2015-11-11 NOTE — Patient Instructions (Signed)
1. Keep taking Protonix 40 mg twice a day. 2. Return for follow-up in 6 months. 3. Call us if your symptoms become substantially worse before then and we can see you sooner.

## 2015-11-11 NOTE — Assessment & Plan Note (Signed)
Symptoms substantially improved on Protonix 40 mg twice a day. Given her past history of requiring changing to multiple PPIs and complicated GERD to control I will have her return in 6 months for long-term symptom progression. Continue current PPI.

## 2015-11-11 NOTE — Progress Notes (Signed)
cc'ed to pcp °

## 2015-11-11 NOTE — Assessment & Plan Note (Signed)
Gastric polyp on EGD found to be benign fundic gland polyp. No further evaluation necessary.

## 2015-11-11 NOTE — Assessment & Plan Note (Signed)
Symptoms resolved status post EGD with dilation. Continue to monitor.

## 2015-11-11 NOTE — Progress Notes (Signed)
Referring Provider: Sinda Du, MD Primary Care Physician:  Alonza Bogus, MD Primary GI:  Dr. Gala Romney  Chief Complaint  Patient presents with  . Follow-up    doing ok    HPI:   [name] is a 45 y.o. female who presents For follow-up in 2 months post procedure. She was last seen in our office 08/24/2015 for dysphagia and GERD. Previous he tried and failed Prilosec, Protonix, Dexilant. She was started on AcipHex 20 mg daily. Also complaining of solid food dysphagia for which she was set up for an EGD with possible dilation. EGD completed 09/11/2015 which noted normal esophagus status post dilation, a few gastric polyps removed, normal second portion of the duodenum. Recommend await pathology, advance diet as tolerated, continue current medications and add Carafate 1 g 4 times daily, no need to repeat upper endoscopy. Surgical pathology found the polyps to be benign fundic gland polyp.  Today she states she's doing well overall. No more dysphagia symptoms. GERD symptoms doing pretty good. Is currently on Protonix 40 mg bid. Rare breakthrough symptoms. Denies abdominal pain, hematochezia, melena. Occasional nausea, no vomiting. Denies chest pain, dyspnea, dizziness, lightheadedness, syncope, near syncope. Denies any other upper or lower GI symptoms.  Past Medical History:  Diagnosis Date  . Anxiety   . GERD (gastroesophageal reflux disease)   . HTN (hypertension)   . Panic attacks   . Polyuria   . Seizures ([place])     Past Surgical History:  Procedure Laterality Date  . BACK SURGERY     complicated by ureteral injury  . ESOPHAGOGASTRODUODENOSCOPY N/A 09/11/2015   Procedure: ESOPHAGOGASTRODUODENOSCOPY (EGD);  Surgeon: Daneil Dolin, MD;  [facility];  Service: Endoscopy;  Laterality: N/A;  0730  . ESOPHAGOGASTRODUODENOSCOPY (EGD) WITH ESOPHAGEAL DILATION N/A 03/20/2013   Dr. Rourk:Normal EGD-status post passage of a Maloney dilator/Status post esophageal  biopsy., benign path  . MALONEY DILATION N/A 09/11/2015   Procedure: Venia Minks DILATION;  Surgeon: Daneil Dolin, MD;  [facility]: [facility];  Service: Endoscopy;  Laterality: N/A;    Current Outpatient Prescriptions  Medication Sig Dispense Refill  . ALPRAZolam (XANAX) 0.5 MG tablet Take 0.5 mg by mouth 2 (two) times daily as needed.   5  . amLODipine (NORVASC) 10 MG tablet Take 10 mg by mouth daily.    . irbesartan (AVAPRO) 300 MG tablet Take 300 mg by mouth daily.  12  . Multiple Vitamins-Minerals (MULTIVITAMIN PO) Take 1 tablet by mouth daily.    Marland Kitchen MYRBETRIQ 25 MG TB24 tablet 25 mg daily.    Marland Kitchen oxyCODONE-acetaminophen (PERCOCET/ROXICET) 5-325 MG per tablet Take 1 tablet by mouth every 6 (six) hours as needed. pain    . pantoprazole (PROTONIX) 40 MG tablet 40 mg 2 (two) times daily.    . phenytoin (DILANTIN) 100 MG ER capsule Take 200 mg by mouth 2 (two) times daily.     Marland Kitchen Plecanatide (TRULANCE) 3 MG TABS Take 3 mg by mouth daily.    . polyethylene glycol (MIRALAX / GLYCOLAX) packet Take 17 g by mouth daily.    . AMITIZA 8 MCG capsule TAKE 1 CAPSULE (8 MCG TOTAL) BY MOUTH 2 (TWO) TIMES DAILY WITH A MEAL. (Patient not taking: Reported on 11/11/2015) 60 capsule 5  . ferrous sulfate 325 (65 FE) MG tablet Take 325 mg by mouth daily with breakfast.    . oxybutynin (DITROPAN-XL) 10 MG 24 hr tablet Take 10 mg by mouth 2 (two) times daily.    Marland Kitchen  RABEprazole (ACIPHEX) 20 MG tablet Take 1 tablet (20 mg total) by mouth daily. (Patient not taking: Reported on 11/11/2015) 30 tablet 2   No current facility-administered medications for this visit.     Allergies as of 11/11/2015 - Review Complete 11/11/2015  Allergen Reaction Noted  . Benadryl [diphenhydramine hcl] Rash 01/17/2012    Family History  Problem Relation Age of Onset  . Throat cancer Father   . Heart disease Father   . Colon cancer Neg Hx   . Liver disease Neg Hx   . Pancreatic cancer Neg Hx   . Other Mother     esophageal stricture and  gallbladder disease  . Other Sister     esophageal stricture and gallbladder problems.   . Diabetes Sister   . Heart disease Maternal Grandfather   . Diabetes Maternal Grandfather   . Arthritis Maternal Grandfather     Social History   Social History  . Marital status: Single    Spouse name: N/A  . Number of children: N/A  . Years of education: 56   Occupational History  . disability Disabled   Social History Main Topics  . Smoking status: Former Smoker    Quit date: 04/04/1994  . Smokeless tobacco: Never Used     Comment: smoked 8 years, quit 18 years ago in the 1996  . Alcohol use No  . Drug use: No  . Sexual activity: Not Currently    Birth control/ protection: None   Other Topics Concern  . None   Social History Narrative  . None    Review of Systems: General: Negative for anorexia, weight loss, fever, chills, fatigue, weakness. ENT: Negative for hoarseness, difficulty swallowing. CV: Negative for chest pain, angina, palpitations, peripheral edema.  Respiratory: Negative for dyspnea at rest, cough, sputum, wheezing.  GI: See history of present illness. Endo: Negative for unusual weight change.  Heme: Negative for bruising or bleeding.   Physical Exam: BP 125/87   Pulse 82   Temp 97.9 F (36.6 C) (Oral)   Ht 5' 6"  (1.676 m)   Wt 165 lb 12.8 oz (75.2 kg)   LMP 11/10/2015 (Exact Date)   BMI 26.76 kg/m  General:   Alert and oriented. Pleasant and cooperative. Well-nourished and well-developed.  Ears:  Normal auditory acuity. Cardiovascular:  S1, S2 present without murmurs appreciated. Extremities without clubbing or edema. Respiratory:  Clear to auscultation bilaterally. No wheezes, rales, or rhonchi. No distress.  Gastrointestinal:  +BS, soft, non-tender and non-distended. No HSM noted. No guarding or rebound. No masses appreciated.  Rectal:  Deferred  Musculoskalatal:  Symmetrical without gross deformities. Neurologic:  Alert and oriented x4;  grossly  normal neurologically. Psych:  Alert and cooperative. Normal mood and affect. Heme/Lymph/Immune: No excessive bruising noted.    11/11/2015 9:24 AM   Disclaimer: This note was dictated with voice recognition software. Similar sounding words can inadvertently be transcribed and may not be corrected upon review.

## 2015-11-30 DIAGNOSIS — G4733 Obstructive sleep apnea (adult) (pediatric): Secondary | ICD-10-CM | POA: Diagnosis not present

## 2015-12-08 DIAGNOSIS — G4733 Obstructive sleep apnea (adult) (pediatric): Secondary | ICD-10-CM | POA: Diagnosis not present

## 2015-12-10 ENCOUNTER — Other Ambulatory Visit (HOSPITAL_COMMUNITY): Payer: Self-pay | Admitting: Pulmonary Disease

## 2015-12-10 DIAGNOSIS — Z1231 Encounter for screening mammogram for malignant neoplasm of breast: Secondary | ICD-10-CM

## 2015-12-14 ENCOUNTER — Ambulatory Visit: Payer: Self-pay | Admitting: Nurse Practitioner

## 2015-12-21 ENCOUNTER — Ambulatory Visit (HOSPITAL_COMMUNITY): Payer: Self-pay

## 2015-12-25 DIAGNOSIS — R21 Rash and other nonspecific skin eruption: Secondary | ICD-10-CM | POA: Diagnosis not present

## 2015-12-25 DIAGNOSIS — G4733 Obstructive sleep apnea (adult) (pediatric): Secondary | ICD-10-CM | POA: Diagnosis not present

## 2015-12-25 DIAGNOSIS — Z23 Encounter for immunization: Secondary | ICD-10-CM | POA: Diagnosis not present

## 2015-12-25 DIAGNOSIS — I10 Essential (primary) hypertension: Secondary | ICD-10-CM | POA: Diagnosis not present

## 2016-01-09 ENCOUNTER — Emergency Department (HOSPITAL_COMMUNITY)
Admission: EM | Admit: 2016-01-09 | Discharge: 2016-01-09 | Disposition: A | Discharge status: Home / Self Care | Payer: Medicare Other | Attending: Emergency Medicine | Admitting: Emergency Medicine

## 2016-01-09 ENCOUNTER — Emergency Department (HOSPITAL_COMMUNITY): Payer: Medicare Other

## 2016-01-09 ENCOUNTER — Encounter (HOSPITAL_COMMUNITY): Payer: Self-pay | Admitting: Emergency Medicine

## 2016-01-09 DIAGNOSIS — Z79899 Other long term (current) drug therapy: Secondary | ICD-10-CM | POA: Diagnosis not present

## 2016-01-09 DIAGNOSIS — M5441 Lumbago with sciatica, right side: Secondary | ICD-10-CM | POA: Diagnosis not present

## 2016-01-09 DIAGNOSIS — Z87891 Personal history of nicotine dependence: Secondary | ICD-10-CM | POA: Diagnosis not present

## 2016-01-09 DIAGNOSIS — I10 Essential (primary) hypertension: Secondary | ICD-10-CM | POA: Diagnosis not present

## 2016-01-09 DIAGNOSIS — M545 Low back pain: Secondary | ICD-10-CM | POA: Diagnosis not present

## 2016-01-09 DIAGNOSIS — M5431 Sciatica, right side: Secondary | ICD-10-CM

## 2016-01-09 LAB — POC URINE PREG, ED: Preg Test, Ur: NEGATIVE

## 2016-01-09 MED ORDER — CYCLOBENZAPRINE HCL 10 MG PO TABS
10.0000 mg | ORAL_TABLET | Freq: Once | ORAL | Status: AC
  Administered 2016-01-09: 10 mg via ORAL
  Filled 2016-01-09: qty 1

## 2016-01-09 MED ORDER — OXYCODONE-ACETAMINOPHEN 5-325 MG PO TABS: 1.0000 | ORAL_TABLET | ORAL | 0 refills | Status: DC | PRN

## 2016-01-09 MED ORDER — OXYCODONE-ACETAMINOPHEN 5-325 MG PO TABS
1.0000 | ORAL_TABLET | Freq: Once | ORAL | Status: AC
  Administered 2016-01-09: 1 via ORAL
  Filled 2016-01-09: qty 1

## 2016-01-09 MED ORDER — CYCLOBENZAPRINE HCL 10 MG PO TABS: 10.0000 mg | ORAL_TABLET | Freq: Three times a day (TID) | ORAL | 0 refills | Status: DC | PRN

## 2016-01-09 MED ORDER — NAPROXEN 500 MG PO TABS: 500.0000 mg | ORAL_TABLET | Freq: Two times a day (BID) | ORAL | 0 refills | Status: DC

## 2016-01-09 NOTE — ED Notes (Signed)
Pt ambulatory to restroom at this time.

## 2016-01-09 NOTE — ED Triage Notes (Signed)
Pt reports low back pain that started 3 days ago. No known injury, hx of sx.

## 2016-01-09 NOTE — ED Notes (Signed)
Pt to xray at this time.

## 2016-01-09 NOTE — Discharge Instructions (Signed)
Alternate ice and heat to your back.  Follow-up with your primary doctor for recheck

## 2016-01-09 NOTE — ED Notes (Signed)
Pt reports having a disc replacement in her lower back several years ago. States similar back pain returned approx 3 days ago, denies new injury or fall. Denies bowel/bladder incontinence. Leg weakness noted, pt ambulatory to room.

## 2016-01-11 NOTE — ED Provider Notes (Signed)
[facility] DEPT Provider Note   CSN: [id number] Arrival date & time: 01/09/16  1121     History   Chief Complaint Chief Complaint  Patient presents with  . Back Pain    HPI [name] is a 45 y.o. female.  HPI  [name] is a 45 y.o. female who presents to the Emergency Department complaining of low back pain for 3 days.  She describes a dull pain to her back that has worsened and radiating into her right upper leg.  Pain is worse with weight bearing, improves with rest.  Denies known injury, abd pain, fever, urine or bowel changes, numbness or weakness of the LE's.  Past Medical History:  Diagnosis Date  . Anxiety   . GERD (gastroesophageal reflux disease)   . HTN (hypertension)   . Panic attacks   . Polyuria   . Seizures ([place])   . Sleep apnea     Patient Active Problem List   Diagnosis Date Noted  . Gastric polyp   . GERD (gastroesophageal reflux disease) 08/24/2015  . Hypoactive sexual desire disorder 05/14/2014  . Abdominal pain, chronic, epigastric 12/08/2013  . Constipation 12/08/2013  . Esophageal dysphagia 02/22/2013  . Abdominal pain, epigastric 02/22/2013  . LUQ pain 02/22/2013  . Difficulty in walking(719.7) 01/25/2012  . Stiffness of joint, not elsewhere classified, ankle and foot 01/25/2012  . Balance problems 01/25/2012  . Ankle sprain 01/17/2012  . Ankle pain, right 01/17/2012    Past Surgical History:  Procedure Laterality Date  . BACK SURGERY     complicated by ureteral injury  . ESOPHAGOGASTRODUODENOSCOPY N/A 09/11/2015   Procedure: ESOPHAGOGASTRODUODENOSCOPY (EGD);  Surgeon: Daneil Dolin, MD;  [facility];  Service: Endoscopy;  Laterality: N/A;  0730  . ESOPHAGOGASTRODUODENOSCOPY (EGD) WITH ESOPHAGEAL DILATION N/A 03/20/2013   Dr. Rourk:Normal EGD-status post passage of a Maloney dilator/Status post esophageal biopsy., benign path  . MALONEY DILATION N/A 09/11/2015   Procedure: Venia Minks DILATION;  Surgeon:  Daneil Dolin, MD;  [facility];  Service: Endoscopy;  Laterality: N/A;    OB History    No data available       Home Medications    Prior to Admission medications   Medication Sig Start Date End Date Taking? Authorizing Provider  ALPRAZolam Duanne Moron) 0.5 MG tablet Take 0.5 mg by mouth 2 (two) times daily as needed.  04/30/14  Yes Historical Provider, MD  amLODipine (NORVASC) 10 MG tablet Take 10 mg by mouth daily.   Yes Historical Provider, MD  irbesartan (AVAPRO) 300 MG tablet Take 300 mg by mouth daily. 07/03/14  Yes Historical Provider, MD  Multiple Vitamins-Minerals (MULTIVITAMIN PO) Take 1 tablet by mouth daily.   Yes Historical Provider, MD  MYRBETRIQ 25 MG TB24 tablet 25 mg daily. 10/20/15  Yes Historical Provider, MD  pantoprazole (PROTONIX) 40 MG tablet 40 mg 2 (two) times daily. 11/05/15  Yes Historical Provider, MD  phenytoin (DILANTIN) 100 MG ER capsule Take 200 mg by mouth 2 (two) times daily.    Yes Historical Provider, MD  Plecanatide (TRULANCE) 3 MG TABS Take 3 mg by mouth daily.   Yes Historical Provider, MD  polyethylene glycol (MIRALAX / GLYCOLAX) packet Take 17 g by mouth daily.   Yes Historical Provider, MD  cyclobenzaprine (FLEXERIL) 10 MG tablet Take 1 tablet (10 mg total) by mouth 3 (three) times daily as needed. 01/09/16   Bonnie Roig, PA-C  naproxen (NAPROSYN) 500 MG tablet Take 1 tablet (500 mg  total) by mouth 2 (two) times daily with a meal. 01/09/16   Skyah Hannon, PA-C  oxyCODONE-acetaminophen (PERCOCET/ROXICET) 5-325 MG tablet Take 1 tablet by mouth every 4 (four) hours as needed. 01/09/16   Jonia Oakey, PA-C    Family History Family History  Problem Relation Age of Onset  . Throat cancer Father   . Heart disease Father   . Other Mother     esophageal stricture and gallbladder disease  . Other Sister     esophageal stricture and gallbladder problems.   . Diabetes Sister   . Heart disease Maternal Grandfather   . Diabetes Maternal  Grandfather   . Arthritis Maternal Grandfather   . Colon cancer Neg Hx   . Liver disease Neg Hx   . Pancreatic cancer Neg Hx     Social History Social History  Substance Use Topics  . Smoking status: Former Smoker    Quit date: 04/04/1994  . Smokeless tobacco: Never Used     Comment: smoked 8 years, quit 18 years ago in the 1996  . Alcohol use No     Allergies   Benadryl [diphenhydramine hcl]   Review of Systems Review of Systems  Constitutional: Negative for fever.  Respiratory: Negative for shortness of breath.   Gastrointestinal: Negative for abdominal pain, constipation and vomiting.  Genitourinary: Negative for decreased urine volume, difficulty urinating, dysuria, flank pain and hematuria.  Musculoskeletal: Positive for back pain. Negative for joint swelling.  Skin: Negative for rash.  Neurological: Negative for weakness and numbness.  All other systems reviewed and are negative.    Physical Exam Updated Vital Signs BP 128/83 (BP Location: Right Arm)   Pulse 85   Temp 98.3 F (36.8 C) (Oral)   Resp 16   Ht 5' 6"  (1.676 m)   Wt 72.6 kg   LMP 12/26/2015   SpO2 100%   BMI 25.82 kg/m   Physical Exam  Constitutional: She is oriented to person, place, and time. She appears well-developed and well-nourished. No distress.  HENT:  Head: Normocephalic and atraumatic.  Neck: Normal range of motion. Neck supple.  Cardiovascular: Normal rate, regular rhythm, normal heart sounds and intact distal pulses.   No murmur heard. Pulmonary/Chest: Effort normal and breath sounds normal. No respiratory distress.  Abdominal: Soft. She exhibits no distension. There is no tenderness.  Musculoskeletal: She exhibits tenderness. She exhibits no edema.       Lumbar back: She exhibits tenderness and pain. She exhibits normal range of motion, no swelling, no deformity, no laceration and normal pulse.  ttp of the lower lumbar spine and right paraspinal muscles.  DP pulses are brisk and  symmetrical.  Distal sensation intact.  Pt has 5/5 strength against resistance of bilateral lower extremities.     Neurological: She is alert and oriented to person, place, and time. She has normal strength. No sensory deficit. She exhibits normal muscle tone. Coordination and gait normal.  Reflex Scores:      Patellar reflexes are 2+ on the right side and 2+ on the left side.      Achilles reflexes are 2+ on the right side and 2+ on the left side. Skin: Skin is warm and dry. No rash noted.  Nursing note and vitals reviewed.    ED Treatments / Results  Labs (all labs ordered are listed, but only abnormal results are displayed) Labs Reviewed  POC URINE PREG, ED    EKG  EKG Interpretation None       Radiology  Dg Lumbar Spine Complete  Result Date: 01/09/2016 CLINICAL DATA:  Low back pain. EXAM: LUMBAR SPINE - COMPLETE 4+ VIEW COMPARISON:  11/28/2014 FINDINGS: Stable radiographic appearance and alignment of an artificial disc at the L4-5 level. No abnormal bony lucency is seen surrounding endplate components. Otherwise stable mild spondylosis at other levels of the lumbar spine. No acute fracture, subluxation or bony lesion identified. IMPRESSION: Stable lumbar spine radiographs demonstrating stable alignment and appearance status post placement of an artificial disc at the L4-5 level. No acute findings. Electronically Signed   By: Aletta Edouard M.D.   On: 01/09/2016 13:45    Procedures Procedures (including critical care time)  Medications Ordered in ED Medications  oxyCODONE-acetaminophen (PERCOCET/ROXICET) 5-325 MG per tablet 1 tablet (1 tablet Oral Given 01/09/16 1234)  cyclobenzaprine (FLEXERIL) tablet 10 mg (10 mg Oral Given 01/09/16 1234)     Initial Impression / Assessment and Plan / ED Course  I have reviewed the triage vital signs and the nursing notes.  Pertinent labs & imaging results that were available during my care of the patient were reviewed by me and  considered in my medical decision making (see chart for details).  Clinical Course    Pt with likely sciatica.  Feeling better.  No focal neuro deficits, no concerning sx's for emergent neurological deficits.  Pt agrees to PMD f/u.  Appears stable for d/c  Final Clinical Impressions(s) / ED Diagnoses   Final diagnoses:  Sciatica of right side    New Prescriptions Discharge Medication List as of 01/09/2016  1:59 PM    START taking these medications   Details  cyclobenzaprine (FLEXERIL) 10 MG tablet Take 1 tablet (10 mg total) by mouth 3 (three) times daily as needed., Starting Sat 01/09/2016, Print    naproxen (NAPROSYN) 500 MG tablet Take 1 tablet (500 mg total) by mouth 2 (two) times daily with a meal., Starting Sat 01/09/2016, Print         Yuan Gann [place], PA-C 01/11/16 2305    Milton Ferguson, MD 01/13/16 2308

## 2016-01-18 ENCOUNTER — Ambulatory Visit (HOSPITAL_COMMUNITY): Payer: Self-pay

## 2016-03-17 ENCOUNTER — Other Ambulatory Visit: Payer: Self-pay | Admitting: Gastroenterology

## 2016-03-19 ENCOUNTER — Other Ambulatory Visit: Payer: Self-pay | Admitting: Gastroenterology

## 2016-04-05 DIAGNOSIS — G4733 Obstructive sleep apnea (adult) (pediatric): Secondary | ICD-10-CM | POA: Diagnosis not present

## 2016-05-01 DIAGNOSIS — G4733 Obstructive sleep apnea (adult) (pediatric): Secondary | ICD-10-CM | POA: Diagnosis not present

## 2016-05-16 ENCOUNTER — Ambulatory Visit: Payer: Medicare Other | Admitting: Nurse Practitioner

## 2016-05-16 ENCOUNTER — Ambulatory Visit (HOSPITAL_COMMUNITY)
Admission: RE | Admit: 2016-05-16 | Discharge: 2016-05-16 | Disposition: A | Discharge status: Home / Self Care | Payer: Medicare Other | Source: Ambulatory Visit | Attending: Pulmonary Disease | Admitting: Pulmonary Disease

## 2016-05-16 DIAGNOSIS — Z1231 Encounter for screening mammogram for malignant neoplasm of breast: Secondary | ICD-10-CM | POA: Diagnosis not present

## 2016-06-01 DIAGNOSIS — G4733 Obstructive sleep apnea (adult) (pediatric): Secondary | ICD-10-CM | POA: Diagnosis not present

## 2016-06-16 DIAGNOSIS — G4733 Obstructive sleep apnea (adult) (pediatric): Secondary | ICD-10-CM | POA: Diagnosis not present

## 2016-06-22 ENCOUNTER — Other Ambulatory Visit (HOSPITAL_COMMUNITY): Payer: Self-pay | Admitting: Pulmonary Disease

## 2016-06-22 DIAGNOSIS — M545 Low back pain: Secondary | ICD-10-CM | POA: Diagnosis not present

## 2016-06-22 DIAGNOSIS — R519 Headache, unspecified: Secondary | ICD-10-CM

## 2016-06-22 DIAGNOSIS — I10 Essential (primary) hypertension: Secondary | ICD-10-CM | POA: Diagnosis not present

## 2016-06-22 DIAGNOSIS — R51 Headache: Principal | ICD-10-CM

## 2016-06-22 DIAGNOSIS — M25561 Pain in right knee: Secondary | ICD-10-CM | POA: Diagnosis not present

## 2016-06-29 DIAGNOSIS — G4733 Obstructive sleep apnea (adult) (pediatric): Secondary | ICD-10-CM | POA: Diagnosis not present

## 2016-07-01 ENCOUNTER — Other Ambulatory Visit (HOSPITAL_COMMUNITY): Payer: Self-pay | Admitting: Pulmonary Disease

## 2016-07-01 ENCOUNTER — Ambulatory Visit (HOSPITAL_COMMUNITY)
Admission: RE | Admit: 2016-07-01 | Discharge: 2016-07-01 | Disposition: A | Discharge status: Home / Self Care | Payer: Medicare Other | Source: Ambulatory Visit | Attending: Pulmonary Disease | Admitting: Pulmonary Disease

## 2016-07-01 DIAGNOSIS — M25561 Pain in right knee: Secondary | ICD-10-CM | POA: Insufficient documentation

## 2016-07-01 DIAGNOSIS — R52 Pain, unspecified: Secondary | ICD-10-CM

## 2016-07-12 ENCOUNTER — Ambulatory Visit (HOSPITAL_COMMUNITY)
Admission: RE | Admit: 2016-07-12 | Discharge: 2016-07-12 | Disposition: A | Discharge status: Home / Self Care | Payer: Medicare Other | Source: Ambulatory Visit | Attending: Pulmonary Disease | Admitting: Pulmonary Disease

## 2016-07-12 DIAGNOSIS — R51 Headache: Secondary | ICD-10-CM | POA: Diagnosis not present

## 2016-07-12 DIAGNOSIS — R519 Headache, unspecified: Secondary | ICD-10-CM

## 2016-07-19 ENCOUNTER — Encounter: Payer: Self-pay | Admitting: Orthopaedic Surgery

## 2016-07-19 ENCOUNTER — Ambulatory Visit (INDEPENDENT_AMBULATORY_CARE_PROVIDER_SITE_OTHER): Payer: Medicare Other | Admitting: Orthopaedic Surgery

## 2016-07-19 VITALS — BP 145/85 | HR 107 | Temp 98.2°F | Ht 66.0 in | Wt 168.0 lb

## 2016-07-19 DIAGNOSIS — G8929 Other chronic pain: Secondary | ICD-10-CM

## 2016-07-19 DIAGNOSIS — M25561 Pain in right knee: Secondary | ICD-10-CM | POA: Diagnosis not present

## 2016-07-19 NOTE — Progress Notes (Signed)
Subjective:    Patient ID: [name], female    DOB: Apr 25, 1970, 46 y.o.   MRN: [id number]  HPI She has had pain on and off of the right knee for several year but it got acute about a month ago.  She had x-rays done 07-01-16 which were negative.  She has seen Dr. Luan Pulling for this.  She has giving way of the right knee, swelling, popping and pain.  She has tried rest, ice, rubs with little help.  The giving way is worse now.  She has no trauma, no redness.  She has been on Naprosyn with no help.   Review of Systems  HENT: Negative for congestion.   Respiratory: Negative for cough and shortness of breath.   Cardiovascular: Negative for chest pain and leg swelling.  Endocrine: Positive for cold intolerance.  Musculoskeletal: Positive for arthralgias, gait problem and joint swelling.  Allergic/Immunologic: Positive for environmental allergies.  Psychiatric/Behavioral: The patient is nervous/anxious.    Past Medical History:  Diagnosis Date  . Anxiety   . GERD (gastroesophageal reflux disease)   . HTN (hypertension)   . Panic attacks   . Polyuria   . Seizures ([place])   . Sleep apnea     Past Surgical History:  Procedure Laterality Date  . BACK SURGERY     complicated by ureteral injury  . ESOPHAGOGASTRODUODENOSCOPY N/A 09/11/2015   Procedure: ESOPHAGOGASTRODUODENOSCOPY (EGD);  Surgeon: Daneil Dolin, MD;  [facility];  Service: Endoscopy;  Laterality: N/A;  0730  . ESOPHAGOGASTRODUODENOSCOPY (EGD) WITH ESOPHAGEAL DILATION N/A 03/20/2013   Dr. Rourk:Normal EGD-status post passage of a Maloney dilator/Status post esophageal biopsy., benign path  . MALONEY DILATION N/A 09/11/2015   Procedure: Venia Minks DILATION;  Surgeon: Daneil Dolin, MD;  [facility];  Service: Endoscopy;  Laterality: N/A;    Current Outpatient Prescriptions on File Prior to Visit  Medication Sig Dispense Refill  . ALPRAZolam (XANAX) 0.5 MG tablet Take 0.5 mg by mouth 2 (two) times  daily as needed.   5  . amLODipine (NORVASC) 10 MG tablet Take 10 mg by mouth daily.    . irbesartan (AVAPRO) 300 MG tablet Take 300 mg by mouth daily.  12  . Multiple Vitamins-Minerals (MULTIVITAMIN PO) Take 1 tablet by mouth daily.    Marland Kitchen MYRBETRIQ 25 MG TB24 tablet 25 mg daily.    Marland Kitchen oxyCODONE-acetaminophen (PERCOCET/ROXICET) 5-325 MG tablet Take 1 tablet by mouth every 4 (four) hours as needed. 15 tablet 0  . pantoprazole (PROTONIX) 40 MG tablet TAKE 1 TABLET (40 MG TOTAL) BY MOUTH 2 (TWO) TIMES DAILY BEFORE A MEAL. 60 tablet 4  . phenytoin (DILANTIN) 100 MG ER capsule Take 200 mg by mouth 2 (two) times daily.     . cyclobenzaprine (FLEXERIL) 10 MG tablet Take 1 tablet (10 mg total) by mouth 3 (three) times daily as needed. (Patient not taking: Reported on 07/19/2016) 21 tablet 0  . naproxen (NAPROSYN) 500 MG tablet Take 1 tablet (500 mg total) by mouth 2 (two) times daily with a meal. (Patient not taking: Reported on 07/19/2016) 20 tablet 0  . Plecanatide (TRULANCE) 3 MG TABS Take 3 mg by mouth daily.    . polyethylene glycol (MIRALAX / GLYCOLAX) packet Take 17 g by mouth daily.     No current facility-administered medications on file prior to visit.     Social History   Social History  . Marital status: Single    Spouse name: N/A  .  Number of children: N/A  . Years of education: 15   Occupational History  . disability Disabled   Social History Main Topics  . Smoking status: Former Smoker    Quit date: 04/04/1994  . Smokeless tobacco: Never Used     Comment: smoked 8 years, quit 18 years ago in the 1996  . Alcohol use No  . Drug use: No  . Sexual activity: Not Currently    Birth control/ protection: None   Other Topics Concern  . Not on file   Social History Narrative  . No narrative on file    Family History  Problem Relation Age of Onset  . Throat cancer Father   . Heart disease Father   . Cancer Father   . Hypertension Father   . Diabetes Father   . Other Mother      esophageal stricture and gallbladder disease  . Thyroid disease Mother   . Other Sister     esophageal stricture and gallbladder problems.   . Diabetes Sister   . Hypertension Sister   . Thyroid disease Sister   . Heart disease Maternal Grandfather   . Diabetes Maternal Grandfather   . Arthritis Maternal Grandfather   . Colon cancer Neg Hx   . Liver disease Neg Hx   . Pancreatic cancer Neg Hx     BP (!) 145/85   Pulse (!) 107   Temp 98.2 F (36.8 C)   Ht 5' 6"  (1.676 m)   Wt 168 lb (76.2 kg)   BMI 27.12 kg/m      Objective:   Physical Exam  Constitutional: She is oriented to person, place, and time. She appears well-developed and well-nourished.  HENT:  Head: Normocephalic and atraumatic.  Eyes: Conjunctivae and EOM are normal. Pupils are equal, round, and reactive to light.  Neck: Normal range of motion. Neck supple.  Cardiovascular: Normal rate, regular rhythm and intact distal pulses.   Pulmonary/Chest: Effort normal.  Abdominal: Soft.  Musculoskeletal: She exhibits tenderness (Right knee with effusion 1+, ROM 0-105, Positive McMurray, Negative Drawer, pain medially, limp to right, NV intact.  Left knee negative.).  Neurological: She is alert and oriented to person, place, and time. She displays normal reflexes. No cranial nerve deficit. She exhibits normal muscle tone. Coordination normal.  Skin: Skin is warm and dry.  Psychiatric: She has a normal mood and affect. Her behavior is normal. Judgment and thought content normal.  Vitals reviewed.         Assessment & Plan:   Encounter Diagnosis  Name Primary?  . Chronic pain of right knee Yes   I am concerned about a meniscus injury medially as she has a positive McMurray medially, effusion, pain not relieved with rest or Naprosyn and not responding to conservative treatments.  I would like to get a MRI of the right knee.  PROCEDURE NOTE:  The patient requests injections of the right knee , verbal consent  was obtained.  The right knee was prepped appropriately after time out was performed.   Sterile technique was observed and injection of 1 cc of Depo-Medrol 40 mg with several cc's of plain xylocaine. Anesthesia was provided by ethyl chloride and a 20-gauge needle was used to inject the knee area. The injection was tolerated well.  A band aid dressing was applied.  The patient was advised to apply ice later today and tomorrow to the injection sight as needed.  Return after MRI of the right knee.  Electronically Perry  Luna Glasgow, MD 4/17/20189:07 AM

## 2016-07-30 ENCOUNTER — Ambulatory Visit
Admission: RE | Admit: 2016-07-30 | Discharge: 2016-07-30 | Disposition: A | Discharge status: Home / Self Care | Payer: Medicare Other | Source: Ambulatory Visit | Attending: Orthopaedic Surgery | Admitting: Orthopaedic Surgery

## 2016-07-30 DIAGNOSIS — G4733 Obstructive sleep apnea (adult) (pediatric): Secondary | ICD-10-CM | POA: Diagnosis not present

## 2016-07-30 DIAGNOSIS — G8929 Other chronic pain: Secondary | ICD-10-CM

## 2016-07-30 DIAGNOSIS — M25561 Pain in right knee: Secondary | ICD-10-CM | POA: Diagnosis not present

## 2016-08-09 ENCOUNTER — Encounter: Payer: Self-pay | Admitting: Orthopaedic Surgery

## 2016-08-09 ENCOUNTER — Ambulatory Visit (INDEPENDENT_AMBULATORY_CARE_PROVIDER_SITE_OTHER): Payer: Medicare Other | Admitting: Orthopaedic Surgery

## 2016-08-09 VITALS — BP 169/106 | HR 88 | Ht 66.0 in | Wt 165.0 lb

## 2016-08-09 DIAGNOSIS — G8929 Other chronic pain: Secondary | ICD-10-CM | POA: Diagnosis not present

## 2016-08-09 DIAGNOSIS — M25561 Pain in right knee: Secondary | ICD-10-CM

## 2016-08-09 NOTE — Progress Notes (Signed)
Patient BL:[name], female DOB:[birth year], 46 y.o. ZRA:[id number]  Chief Complaint  Patient presents with  . Knee Pain    MRI review-better since injection    HPI  [name] is a 46 y.o. female who has pain and giving way of the right knee.  She had a MRI done and it shows: IMPRESSION: 1. Radial tear of the root of the posterior horn of the medial meniscus. 2. Mild partial-thickness cartilage loss of the medial femorotibial compartment. 3. Edema in superolateral Hoffa's fat as can be seen with patellar tendon-lateral femoral condyle friction syndrome.  I have explained the findings to her.  I have recommended arthroscopy of the right knee.  I will have her see Dr. Aline Brochure for this.  She is agreeable. HPI  Body mass index is 26.63 kg/m.  ROS  Review of Systems  HENT: Negative for congestion.   Respiratory: Negative for cough and shortness of breath.   Cardiovascular: Negative for chest pain and leg swelling.  Endocrine: Positive for cold intolerance.  Musculoskeletal: Positive for arthralgias, gait problem and joint swelling.  Allergic/Immunologic: Positive for environmental allergies.  Psychiatric/Behavioral: The patient is nervous/anxious.     Past Medical History:  Diagnosis Date  . Anxiety   . GERD (gastroesophageal reflux disease)   . HTN (hypertension)   . Panic attacks   . Polyuria   . Seizures ([place])   . Sleep apnea     Past Surgical History:  Procedure Laterality Date  . BACK SURGERY     complicated by ureteral injury  . ESOPHAGOGASTRODUODENOSCOPY N/A 09/11/2015   Procedure: ESOPHAGOGASTRODUODENOSCOPY (EGD);  Surgeon: Daneil Dolin, MD;  Location: [facility];  Service: Endoscopy;  Laterality: N/A;  0730  . ESOPHAGOGASTRODUODENOSCOPY (EGD) WITH ESOPHAGEAL DILATION N/A 03/20/2013   Dr. Rourk:Normal EGD-status post passage of a Maloney dilator/Status post esophageal biopsy., benign path  . MALONEY DILATION N/A 09/11/2015   Procedure:  Venia Minks DILATION;  Surgeon: Daneil Dolin, MD;  Location: [facility];  Service: Endoscopy;  Laterality: N/A;    Family History  Problem Relation Age of Onset  . Throat cancer Father   . Heart disease Father   . Cancer Father   . Hypertension Father   . Diabetes Father   . Other Mother     esophageal stricture and gallbladder disease  . Thyroid disease Mother   . Other Sister     esophageal stricture and gallbladder problems.   . Diabetes Sister   . Hypertension Sister   . Thyroid disease Sister   . Heart disease Maternal Grandfather   . Diabetes Maternal Grandfather   . Arthritis Maternal Grandfather   . Colon cancer Neg Hx   . Liver disease Neg Hx   . Pancreatic cancer Neg Hx     Social History Social History  Substance Use Topics  . Smoking status: Former Smoker    Quit date: 04/04/1994  . Smokeless tobacco: Never Used     Comment: smoked 8 years, quit 18 years ago in the 1996  . Alcohol use No    Allergies  Allergen Reactions  . Benadryl [Diphenhydramine Hcl] Rash    Current Outpatient Prescriptions  Medication Sig Dispense Refill  . ALPRAZolam (XANAX) 0.5 MG tablet Take 0.5 mg by mouth 2 (two) times daily as needed.   5  . amLODipine (NORVASC) 10 MG tablet Take 10 mg by mouth daily.    . cyclobenzaprine (FLEXERIL) 10 MG tablet Take 1 tablet (10 mg total) by mouth  3 (three) times daily as needed. (Patient not taking: Reported on 07/19/2016) 21 tablet 0  . irbesartan (AVAPRO) 300 MG tablet Take 300 mg by mouth daily.  12  . linaclotide (LINZESS) 290 MCG CAPS capsule Take 290 mcg by mouth daily before breakfast.    . Multiple Vitamins-Minerals (MULTIVITAMIN PO) Take 1 tablet by mouth daily.    Marland Kitchen MYRBETRIQ 25 MG TB24 tablet 25 mg daily.    . naproxen (NAPROSYN) 500 MG tablet Take 1 tablet (500 mg total) by mouth 2 (two) times daily with a meal. (Patient not taking: Reported on 07/19/2016) 20 tablet 0  . oxyCODONE-acetaminophen (PERCOCET/ROXICET) 5-325 MG tablet Take  1 tablet by mouth every 4 (four) hours as needed. 15 tablet 0  . pantoprazole (PROTONIX) 40 MG tablet TAKE 1 TABLET (40 MG TOTAL) BY MOUTH 2 (TWO) TIMES DAILY BEFORE A MEAL. 60 tablet 4  . phenytoin (DILANTIN) 100 MG ER capsule Take 200 mg by mouth 2 (two) times daily.     Marland Kitchen Plecanatide (TRULANCE) 3 MG TABS Take 3 mg by mouth daily.    . polyethylene glycol (MIRALAX / GLYCOLAX) packet Take 17 g by mouth daily.     No current facility-administered medications for this visit.      Physical Exam  Blood pressure (!) 169/106, pulse 88, height 5' 6"  (1.676 m), weight 165 lb (74.8 kg).  Constitutional: overall normal hygiene, normal nutrition, well developed, normal grooming, normal body habitus. Assistive device:none  Musculoskeletal: gait and station Limp right, muscle tone and strength are normal, no tremors or atrophy is present.  .  Neurological: coordination overall normal.  Deep tendon reflex/nerve stretch intact.  Sensation normal.  Cranial nerves II-XII intact.   Skin:   Normal overall no scars, lesions, ulcers or rashes. No psoriasis.  Psychiatric: Alert and oriented x 3.  Recent memory intact, remote memory unclear.  Normal mood and affect. Well groomed.  Good eye contact.  Cardiovascular: overall no swelling, no varicosities, no edema bilaterally, normal temperatures of the legs and arms, no clubbing, cyanosis and good capillary refill.  Lymphatic: palpation is normal.  Right knee with slight effusion, pain medially, positive medial McMurray, ROM 0 to 105 with pain, limp to the right, NV intact.  The patient has been educated about the nature of the problem(s) and counseled on treatment options.  The patient appeared to understand what I have discussed and is in agreement with it.  Encounter Diagnosis  Name Primary?  . Chronic pain of right knee Yes    PLAN Call if any problems.  Precautions discussed.  Continue current medications.   Return to clinic to see Dr. Aline Brochure  for consideration of knee surgery right    Electronically Signed Sanjuana Kava, MD 5/8/201810:37 AM

## 2016-08-17 ENCOUNTER — Ambulatory Visit (INDEPENDENT_AMBULATORY_CARE_PROVIDER_SITE_OTHER): Payer: Medicare Other | Admitting: Orthopedic Surgery

## 2016-08-17 ENCOUNTER — Encounter: Payer: Self-pay | Admitting: Orthopedic Surgery

## 2016-08-17 VITALS — BP 129/80 | HR 106 | Ht 67.0 in | Wt 165.0 lb

## 2016-08-17 DIAGNOSIS — M1711 Unilateral primary osteoarthritis, right knee: Secondary | ICD-10-CM

## 2016-08-17 DIAGNOSIS — M23321 Other meniscus derangements, posterior horn of medial meniscus, right knee: Secondary | ICD-10-CM

## 2016-08-17 NOTE — Patient Instructions (Addendum)
SURGERY 08/31/16  You have decided to proceed with operative arthroscopy of the knee. You have decided not to continue with nonoperative measures such as but not limited to oral medication, weight loss, activity modification, physical therapy, bracing, or injection.  We will perform operative arthroscopy of the knee. Some of the risks associated with arthroscopic surgery of the knee include but are not limited to Bleeding Infection Swelling Stiffness Blood clot Pain  If you're not comfortable with these risks and would like to continue with nonoperative treatment please let Dr. Aline Brochure know prior to your surgery.   Knee Arthroscopy Knee arthroscopy is a surgical procedure that is used to examine the inside of your knee joint and repair any damage. The surgeon puts a small, lighted instrument with a camera on the tip (arthroscope) through a small incision in your knee. The camera sends pictures to a monitor in the operating room. Your surgeon uses those pictures to guide the surgical instruments through other incisions to the area of damage. Knee arthroscopy can be used to treat many types of knee problems. It may be used:  To repair a torn ligament.  To repair or remove damaged tissue.  To remove a fluid-filled sac (cyst) from your knee. Tell a health care provider about:  Any allergies you have.  All medicines you are taking, including vitamins, herbs, eye drops, creams, and over-the-counter medicines.  Any problems you or family members have had with anesthetic medicines.  Any blood disorders you have.  Any surgeries you have had.  Any medical conditions you have. What are the risks? Generally, this is a safe procedure. However, problems may occur, including:  Infection.  Bleeding.  Damage to blood vessels, nerves, or structures of your knee.  A blood clot that forms in your leg and travels to your lung.  Failure to relieve symptoms. What happens before the  procedure?  Ask your health care provider about:  Changing or stopping your regular medicines. This is especially important if you are taking diabetes medicines or blood thinners.  Taking medicines such as aspirin and ibuprofen. These medicines can thin your blood. Do not take these medicines before your procedure if your health care provider instructs you not to.  Follow your health care provider's instructions about eating or drinking restrictions.  Plan to have someone take you home after the procedure.  If you go home right after the procedure, plan to have someone with you for 24 hours.  Do not drink alcohol unless your health care provider says that you can.  Do not use any tobacco products, including cigarettes, chewing tobacco, or electronic cigarettes unless your health care provider says that you can. If you need help quitting, ask your health care provider.  You may have a physical exam. What happens during the procedure?  An IV tube will be inserted into one of your veins.  You will be given one or more of the following:  A medicine that helps you relax (sedative).  A medicine that numbs the area (local anesthetic).  A medicine that makes you fall asleep (general anesthetic).  A medicine that is injected into your spine that numbs the area below and slightly above the injection site (spinal anesthetic).  A medicine that is injected into an area of your body that numbs everything below the injection site (regional anesthetic).  A cuff may be placed around your upper leg to slow bleeding during the procedure.  The surgeon will make a small number of incisions  around your knee.  Your knee joint will be flushed and filled with a germ-free (sterile) solution.  The arthroscope will be passed through an incision into your knee joint.  More instruments will be passed through other incisions to repair your knee as needed.  The fluid will be removed from your  knee.  The incisions will be closed with adhesive strips or stitches (sutures).  A bandage (dressing) will be placed over your knee. The procedure may vary among health care providers and hospitals. What happens after the procedure?  Your blood pressure, heart rate, breathing rate and blood oxygen level will be monitored often until the medicines you were given have worn off.  You may be given medicine for pain.  You may get crutches to help you walk without using your knee to support your body weight.  You may have to wear compression stockings. These stocking help to prevent blood clots and reduce swelling in your legs. This information is not intended to replace advice given to you by your health care provider. Make sure you discuss any questions you have with your health care provider. Document Released: 03/18/2000 Document Revised: 08/27/2015 Document Reviewed: 03/17/2014 Elsevier Interactive Patient Education  2017 [employer].

## 2016-08-17 NOTE — Progress Notes (Signed)
OFFICE VISIT - pre-op    Chief Complaint  Patient presents with  . Knee Pain    Right knee pain, referred by Dr. Luna Glasgow.    46 yo female presents for evaluation of possible surgery in her right knee. She complains of global knee pain which localizes to the right medial joint line with catching locking giving way and dull aching constant pain. She did get some improvement with the injection of her right knee but says she cannot live with it the way it is.  She had an MRI which shows a torn medial meniscus and chondral surface changes of the medial femoral condyle consistent with a primary osteoarthritis    Review of Systems  Gastrointestinal: Positive for constipation.  Genitourinary: Positive for frequency.  Musculoskeletal: Positive for back pain and joint pain.  All other systems reviewed and are negative.     Current Outpatient Prescriptions:  .  ALPRAZolam (XANAX) 0.5 MG tablet, Take 0.5 mg by mouth 2 (two) times daily as needed. , Disp: , Rfl: 5 .  amLODipine (NORVASC) 10 MG tablet, Take 10 mg by mouth daily., Disp: , Rfl:  .  cyclobenzaprine (FLEXERIL) 10 MG tablet, Take 1 tablet (10 mg total) by mouth 3 (three) times daily as needed. (Patient not taking: Reported on 07/19/2016), Disp: 21 tablet, Rfl: 0 .  irbesartan (AVAPRO) 300 MG tablet, Take 300 mg by mouth daily., Disp: , Rfl: 12 .  linaclotide (LINZESS) 290 MCG CAPS capsule, Take 290 mcg by mouth daily before breakfast., Disp: , Rfl:  .  Multiple Vitamins-Minerals (MULTIVITAMIN PO), Take 1 tablet by mouth daily., Disp: , Rfl:  .  MYRBETRIQ 25 MG TB24 tablet, 25 mg daily., Disp: , Rfl:  .  naproxen (NAPROSYN) 500 MG tablet, Take 1 tablet (500 mg total) by mouth 2 (two) times daily with a meal. (Patient not taking: Reported on 07/19/2016), Disp: 20 tablet, Rfl: 0 .  oxyCODONE-acetaminophen (PERCOCET/ROXICET) 5-325 MG tablet, Take 1 tablet by mouth every 4 (four) hours as needed., Disp: 15 tablet, Rfl: 0 .  pantoprazole  (PROTONIX) 40 MG tablet, TAKE 1 TABLET (40 MG TOTAL) BY MOUTH 2 (TWO) TIMES DAILY BEFORE A MEAL., Disp: 60 tablet, Rfl: 4 .  phenytoin (DILANTIN) 100 MG ER capsule, Take 200 mg by mouth 2 (two) times daily. , Disp: , Rfl:  .  Plecanatide (TRULANCE) 3 MG TABS, Take 3 mg by mouth daily., Disp: , Rfl:  .  polyethylene glycol (MIRALAX / GLYCOLAX) packet, Take 17 g by mouth daily., Disp: , Rfl:    Past Medical History:  Diagnosis Date  . Anxiety   . GERD (gastroesophageal reflux disease)   . HTN (hypertension)   . Panic attacks   . Polyuria   . Seizures ([place])   . Sleep apnea     Past Surgical History:  Procedure Laterality Date  . BACK SURGERY     complicated by ureteral injury  . ESOPHAGOGASTRODUODENOSCOPY N/A 09/11/2015   Procedure: ESOPHAGOGASTRODUODENOSCOPY (EGD);  Surgeon: Daneil Dolin, MD;  Location: [facility];  Service: Endoscopy;  Laterality: N/A;  0730  . ESOPHAGOGASTRODUODENOSCOPY (EGD) WITH ESOPHAGEAL DILATION N/A 03/20/2013   Dr. Rourk:Normal EGD-status post passage of a Maloney dilator/Status post esophageal biopsy., benign path  . MALONEY DILATION N/A 09/11/2015   Procedure: Venia Minks DILATION;  Surgeon: Daneil Dolin, MD;  Location: [facility];  Service: Endoscopy;  Laterality: N/A;    Family History  Problem Relation Age of Onset  . Throat cancer Father   .  Heart disease Father   . Cancer Father   . Hypertension Father   . Diabetes Father   . Other Mother        esophageal stricture and gallbladder disease  . Thyroid disease Mother   . Other Sister        esophageal stricture and gallbladder problems.   . Diabetes Sister   . Hypertension Sister   . Thyroid disease Sister   . Heart disease Maternal Grandfather   . Diabetes Maternal Grandfather   . Arthritis Maternal Grandfather   . Colon cancer Neg Hx   . Liver disease Neg Hx   . Pancreatic cancer Neg Hx    Social History  Substance Use Topics  . Smoking status: Former Smoker    Quit date: 04/04/1994   . Smokeless tobacco: Never Used     Comment: smoked 8 years, quit 18 years ago in the 1996  . Alcohol use No    BP 129/80   Pulse (!) 106   Ht 5' 7"  (1.702 m)   Wt 165 lb (74.8 kg)   BMI 25.84 kg/m   Physical Exam  Constitutional: She is oriented to person, place, and time. She appears well-developed and well-nourished.  Neurological: She is alert and oriented to person, place, and time.  Psychiatric: She has a normal mood and affect.  Vitals reviewed.   Right Knee Exam   Tenderness  The patient is experiencing tenderness in the medial joint line.  Range of Motion  Extension: normal  Flexion: normal   Muscle Strength   The patient has normal right knee strength.  Tests  McMurray:  Medial - positive Lateral - negative Drawer:       Anterior - negative    Posterior - negative Varus: negative Valgus: negative  Other  Erythema: absent Scars: absent Sensation: normal Pulse: present Swelling: none   Left Knee Exam   Tenderness  The patient is experiencing no tenderness.     Range of Motion  Extension: normal  Flexion: normal   Muscle Strength   The patient has normal left knee strength.  Tests  McMurray:  Medial - negative Lateral - negative Drawer:       Anterior - negative     Posterior - negative Varus: negative Valgus: negative  Other  Erythema: absent Scars: absent Sensation: normal Pulse: present Swelling: none      Encounter Diagnoses  Name Primary?  . Derangement of posterior horn of medial meniscus of right knee Yes  . Primary osteoarthritis of right knee    PLAN:   This procedure has been fully reviewed with the patient and written informed consent has been obtained.  The patient will undergo arthroscopy of the right knee and partial medial meniscectomy. We discussed that she will probably still have some medial pain based on her arthritis on the medial compartment.

## 2016-08-22 NOTE — Patient Instructions (Addendum)
[name]  08/22/2016     @PREFPERIOPPHARMACY @   Your procedure is scheduled on 08/31/2016.  Report to [name] at 6:15 A.M.  Call this number if you have problems the morning of surgery:  [phone]   Remember:  Do not eat food or drink liquids after midnight.  Take these medicines the morning of surgery with A SIP OF WATER : Xanax, Norvasc, Avapro, Myrbetriq, Protonix and Dilantin   Do not wear jewelry, make-up or nail polish.  Do not wear lotions, powders, or perfumes, or deoderant.  Do not shave 48 hours prior to surgery.  Men may shave face and neck.  Do not bring valuables to the hospital.  [facility] is not responsible for any belongings or valuables.  Contacts, dentures or bridgework may not be worn into surgery.  Leave your suitcase in the car.  After surgery it may be brought to your room.  For patients admitted to the hospital, discharge time will be determined by your treatment team.  Patients discharged the day of surgery will not be allowed to drive home.   Name and phone number of your driver:   family Special instructions:  n/a  Please read over the following fact sheets that you were given. Care and Recovery After Surgery     Knee Arthroscopy Knee arthroscopy is a surgical procedure that is used to examine the inside of your knee joint and repair any damage. The surgeon puts a small, lighted instrument with a camera on the tip (arthroscope) through a small incision in your knee. The camera sends pictures to a monitor in the operating room. Your surgeon uses those pictures to guide the surgical instruments through other incisions to the area of damage. Knee arthroscopy can be used to treat many types of knee problems. It may be used:  To repair a torn ligament.  To repair or remove damaged tissue.  To remove a fluid-filled sac (cyst) from your knee. Tell a health care provider about:  Any allergies you have.  All medicines you are  taking, including vitamins, herbs, eye drops, creams, and over-the-counter medicines.  Any problems you or family members have had with anesthetic medicines.  Any blood disorders you have.  Any surgeries you have had.  Any medical conditions you have. What are the risks? Generally, this is a safe procedure. However, problems may occur, including:  Infection.  Bleeding.  Damage to blood vessels, nerves, or structures of your knee.  A blood clot that forms in your leg and travels to your lung.  Failure to relieve symptoms. What happens before the procedure?  Ask your health care provider about:  Changing or stopping your regular medicines. This is especially important if you are taking diabetes medicines or blood thinners.  Taking medicines such as aspirin and ibuprofen. These medicines can thin your blood. Do not take these medicines before your procedure if your health care provider instructs you not to.  Follow your health care provider's instructions about eating or drinking restrictions.  Plan to have someone take you home after the procedure.  If you go home right after the procedure, plan to have someone with you for 24 hours.  Do not drink alcohol unless your health care provider says that you can.  Do not use any tobacco products, including cigarettes, chewing tobacco, or electronic cigarettes unless your health care provider says that you can. If you need help quitting, ask your health care provider.  You may have a  physical exam. What happens during the procedure?  An IV tube will be inserted into one of your veins.  You will be given one or more of the following:  A medicine that helps you relax (sedative).  A medicine that numbs the area (local anesthetic).  A medicine that makes you fall asleep (general anesthetic).  A medicine that is injected into your spine that numbs the area below and slightly above the injection site (spinal anesthetic).  A  medicine that is injected into an area of your body that numbs everything below the injection site (regional anesthetic).  A cuff may be placed around your upper leg to slow bleeding during the procedure.  The surgeon will make a small number of incisions around your knee.  Your knee joint will be flushed and filled with a germ-free (sterile) solution.  The arthroscope will be passed through an incision into your knee joint.  More instruments will be passed through other incisions to repair your knee as needed.  The fluid will be removed from your knee.  The incisions will be closed with adhesive strips or stitches (sutures).  A bandage (dressing) will be placed over your knee. The procedure may vary among health care providers and hospitals. What happens after the procedure?  Your blood pressure, heart rate, breathing rate and blood oxygen level will be monitored often until the medicines you were given have worn off.  You may be given medicine for pain.  You may get crutches to help you walk without using your knee to support your body weight.  You may have to wear compression stockings. These stocking help to prevent blood clots and reduce swelling in your legs. This information is not intended to replace advice given to you by your health care provider. Make sure you discuss any questions you have with your health care provider. Document Released: 03/18/2000 Document Revised: 08/27/2015 Document Reviewed: 03/17/2014 Elsevier Interactive Patient Education  2017 [employer].

## 2016-08-24 ENCOUNTER — Other Ambulatory Visit: Payer: Self-pay

## 2016-08-24 ENCOUNTER — Encounter: Payer: Self-pay | Admitting: *Deleted

## 2016-08-24 ENCOUNTER — Other Ambulatory Visit: Payer: Self-pay | Admitting: Nurse Practitioner

## 2016-08-24 ENCOUNTER — Encounter (HOSPITAL_COMMUNITY): Payer: Self-pay

## 2016-08-24 ENCOUNTER — Encounter (HOSPITAL_COMMUNITY)
Admission: RE | Admit: 2016-08-24 | Discharge: 2016-08-24 | Disposition: A | Discharge status: Home / Self Care | Payer: Medicare Other | Source: Ambulatory Visit | Attending: Orthopedic Surgery | Admitting: Orthopedic Surgery

## 2016-08-24 DIAGNOSIS — Z01810 Encounter for preprocedural cardiovascular examination: Secondary | ICD-10-CM | POA: Diagnosis not present

## 2016-08-24 DIAGNOSIS — M23321 Other meniscus derangements, posterior horn of medial meniscus, right knee: Secondary | ICD-10-CM | POA: Diagnosis not present

## 2016-08-24 DIAGNOSIS — R000 Tachycardia, unspecified: Secondary | ICD-10-CM | POA: Diagnosis not present

## 2016-08-24 DIAGNOSIS — Z01818 Encounter for other preprocedural examination: Secondary | ICD-10-CM | POA: Diagnosis not present

## 2016-08-24 DIAGNOSIS — M1711 Unilateral primary osteoarthritis, right knee: Secondary | ICD-10-CM | POA: Insufficient documentation

## 2016-08-24 HISTORY — DX: Unspecified osteoarthritis, unspecified site: M19.90

## 2016-08-24 LAB — BASIC METABOLIC PANEL
Anion gap: 10 (ref 5–15)
BUN: 10 mg/dL (ref 6–20)
CALCIUM: 9.4 mg/dL (ref 8.9–10.3)
CO2: 28 mmol/L (ref 22–32)
CREATININE: 0.74 mg/dL (ref 0.44–1.00)
Chloride: 99 mmol/L — ABNORMAL LOW (ref 101–111)
GFR calc non Af Amer: >60 mL/min (ref >60)
Glucose, Bld: 107 mg/dL — ABNORMAL HIGH (ref 65–99)
Potassium: 4.3 mmol/L (ref 3.5–5.1)
SODIUM: 137 mmol/L (ref 135–145)

## 2016-08-24 LAB — CBC WITH DIFFERENTIAL/PLATELET
BASOS PCT: 0 %
Basophils Absolute: 0 10*3/uL (ref 0.0–0.1)
EOS ABS: 0.1 10*3/uL (ref 0.0–0.7)
EOS PCT: 1 %
HCT: 38.9 % (ref 36.0–46.0)
Hemoglobin: 13.3 g/dL (ref 12.0–15.0)
LYMPHS ABS: 1.7 10*3/uL (ref 0.7–4.0)
Lymphocytes Relative: 24 %
MCH: 29.8 pg (ref 26.0–34.0)
MCHC: 34.2 g/dL (ref 30.0–36.0)
MCV: 87.2 fL (ref 78.0–100.0)
MONO ABS: 0.7 10*3/uL (ref 0.1–1.0)
Monocytes Relative: 9 %
NEUTROS PCT: 66 %
Neutro Abs: 4.8 10*3/uL (ref 1.7–7.7)
PLATELETS: 239 10*3/uL (ref 150–400)
RBC: 4.46 MIL/uL (ref 3.87–5.11)
RDW: 13.8 % (ref 11.5–15.5)
WBC: 7.3 10*3/uL (ref 4.0–10.5)

## 2016-08-24 LAB — SURGICAL PCR SCREEN
MRSA, PCR: NEGATIVE
STAPHYLOCOCCUS AUREUS: POSITIVE — AB

## 2016-08-24 LAB — PREGNANCY, URINE: PREG TEST UR: NEGATIVE

## 2016-08-24 MED ORDER — MUPIROCIN 2 % EX OINT
TOPICAL_OINTMENT | CUTANEOUS | Status: AC
  Filled 2016-08-24: qty 22

## 2016-08-24 NOTE — Progress Notes (Signed)
Surgical pre authorization not required per patient plan for CPT 29881 outpatient surgery  Reference Peggy B 08/24/16 10:56am

## 2016-08-29 DIAGNOSIS — G4733 Obstructive sleep apnea (adult) (pediatric): Secondary | ICD-10-CM | POA: Diagnosis not present

## 2016-08-30 NOTE — H&P (Addendum)
Preop history and physical   Chief Complaint  Patient presents with  . Knee Pain      Right knee pain, referred by Dr. Luna Glasgow.      46 yo female presents for evaluation of possible surgery in her right knee. She complains of global knee pain which localizes to the right medial joint line with catching locking giving way and dull aching constant pain. She did get some improvement with the injection of her right knee but says she cannot live with it the way it is.   She had an MRI which shows a torn medial meniscus and chondral surface changes of the medial femoral condyle consistent with a primary osteoarthritis     Review of Systems  Gastrointestinal: Positive for constipation.  Genitourinary: Positive for frequency.  Musculoskeletal: Positive for back pain and joint pain.  All other systems reviewed and are negative.    Current Outpatient Prescriptions:  .  ALPRAZolam (XANAX) 0.5 MG tablet, Take 0.5 mg by mouth 2 (two) times daily as needed. , Disp: , Rfl: 5 .  amLODipine (NORVASC) 10 MG tablet, Take 10 mg by mouth daily., Disp: , Rfl:  .  cyclobenzaprine (FLEXERIL) 10 MG tablet, Take 1 tablet (10 mg total) by mouth 3 (three) times daily as needed. (Patient not taking: Reported on 07/19/2016), Disp: 21 tablet, Rfl: 0 .  irbesartan (AVAPRO) 300 MG tablet, Take 300 mg by mouth daily., Disp: , Rfl: 12 .  linaclotide (LINZESS) 290 MCG CAPS capsule, Take 290 mcg by mouth daily before breakfast., Disp: , Rfl:  .  Multiple Vitamins-Minerals (MULTIVITAMIN PO), Take 1 tablet by mouth daily., Disp: , Rfl:  .  MYRBETRIQ 25 MG TB24 tablet, 25 mg daily., Disp: , Rfl:  .  naproxen (NAPROSYN) 500 MG tablet, Take 1 tablet (500 mg total) by mouth 2 (two) times daily with a meal. (Patient not taking: Reported on 07/19/2016), Disp: 20 tablet, Rfl: 0 .  oxyCODONE-acetaminophen (PERCOCET/ROXICET) 5-325 MG tablet, Take 1 tablet by mouth every 4 (four) hours as needed., Disp: 15 tablet, Rfl: 0 .   pantoprazole (PROTONIX) 40 MG tablet, TAKE 1 TABLET (40 MG TOTAL) BY MOUTH 2 (TWO) TIMES DAILY BEFORE A MEAL., Disp: 60 tablet, Rfl: 4 .  phenytoin (DILANTIN) 100 MG ER capsule, Take 200 mg by mouth 2 (two) times daily. , Disp: , Rfl:  .  Plecanatide (TRULANCE) 3 MG TABS, Take 3 mg by mouth daily., Disp: , Rfl:  .  polyethylene glycol (MIRALAX / GLYCOLAX) packet, Take 17 g by mouth daily., Disp: , Rfl:          Past Medical History:  Diagnosis Date  . Anxiety    . GERD (gastroesophageal reflux disease)    . HTN (hypertension)    . Panic attacks    . Polyuria    . Seizures ([place])    . Sleep apnea             Past Surgical History:  Procedure Laterality Date  . BACK SURGERY        complicated by ureteral injury  . ESOPHAGOGASTRODUODENOSCOPY N/A 09/11/2015    Procedure: ESOPHAGOGASTRODUODENOSCOPY (EGD);  Surgeon: Daneil Dolin, MD;  Location: [facility];  Service: Endoscopy;  Laterality: N/A;  0730  . ESOPHAGOGASTRODUODENOSCOPY (EGD) WITH ESOPHAGEAL DILATION N/A 03/20/2013    Dr. Rourk:Normal EGD-status post passage of a Maloney dilator/Status post esophageal biopsy., benign path  . MALONEY DILATION N/A 09/11/2015    Procedure: Venia Minks DILATION;  Surgeon: Cristopher Estimable  Rourk, MD;  Location: [facility];  Service: Endoscopy;  Laterality: N/A;           Family History  Problem Relation Age of Onset  . Throat cancer Father    . Heart disease Father    . Cancer Father    . Hypertension Father    . Diabetes Father    . Other Mother          esophageal stricture and gallbladder disease  . Thyroid disease Mother    . Other Sister          esophageal stricture and gallbladder problems.   . Diabetes Sister    . Hypertension Sister    . Thyroid disease Sister    . Heart disease Maternal Grandfather    . Diabetes Maternal Grandfather    . Arthritis Maternal Grandfather    . Colon cancer Neg Hx    . Liver disease Neg Hx    . Pancreatic cancer Neg Hx            Social History   Substance Use Topics  . Smoking status: Former Smoker      Quit date: 04/04/1994  . Smokeless tobacco: Never Used        Comment: smoked 8 years, quit 18 years ago in the 1996  . Alcohol use No      BP 129/80   Pulse (!) 106   Ht 5' 7"  (1.702 m)   Wt 165 lb (74.8 kg)   BMI 25.84 kg/m    Physical Exam  Constitutional: She is oriented to person, place, and time. She appears well-developed and well-nourished.  Neurological: She is alert and oriented to person, place, and time.  Psychiatric: She has a normal mood and affect.  Vitals reviewed.     Right Knee Exam    Tenderness  The patient is experiencing tenderness in the medial joint line. Range of Motion  Extension: normal  Flexion: normal  Muscle Strength The patient has normal right knee strength. Tests  McMurray:  Medial - positive Lateral - negative Drawer:       Anterior - negative    Posterior - negative Varus: negative Valgus: negative Other  Erythema: absent Scars: absent Sensation: normal Pulse: present Swelling: none     Left Knee Exam    Tenderness  The patient is experiencing no tenderness.   Range of Motion  Extension: normal  Flexion: normal Muscle Strength The patient has normal left knee strength.  Tests  McMurray:  Medial - negative Lateral - negative Drawer:       Anterior - negative     Posterior - negative Varus: negative Valgus: negative  Other  Erythema: absent Scars: absent Sensation: normal Pulse: present Swelling: none       Encounter Diagnoses  Name Primary?  . Derangement of posterior horn of medial meniscus of right knee Yes  . Primary osteoarthritis of right knee      PLAN:    This procedure has been fully reviewed with the patient and written informed consent has been obtained.   The patient will undergo arthroscopy of the right knee and partial medial meniscectomy. We discussed that she will probably still have some medial pain based on her arthritis on the medial  compartment.

## 2016-08-31 ENCOUNTER — Telehealth: Payer: Self-pay | Admitting: Orthopedic Surgery

## 2016-08-31 ENCOUNTER — Ambulatory Visit (HOSPITAL_COMMUNITY): Payer: Medicare Other | Admitting: Anesthesiology

## 2016-08-31 ENCOUNTER — Encounter (HOSPITAL_COMMUNITY)
Admission: RE | Disposition: A | Discharge status: Home / Self Care | Payer: Self-pay | Source: Ambulatory Visit | Attending: Orthopedic Surgery

## 2016-08-31 ENCOUNTER — Ambulatory Visit (HOSPITAL_COMMUNITY)
Admission: RE | Admit: 2016-08-31 | Discharge: 2016-08-31 | Disposition: A | Discharge status: Home / Self Care | Payer: Medicare Other | Source: Ambulatory Visit | Attending: Orthopedic Surgery | Admitting: Orthopedic Surgery

## 2016-08-31 ENCOUNTER — Encounter (HOSPITAL_COMMUNITY): Payer: Self-pay | Admitting: *Deleted

## 2016-08-31 DIAGNOSIS — F419 Anxiety disorder, unspecified: Secondary | ICD-10-CM | POA: Insufficient documentation

## 2016-08-31 DIAGNOSIS — K219 Gastro-esophageal reflux disease without esophagitis: Secondary | ICD-10-CM | POA: Diagnosis not present

## 2016-08-31 DIAGNOSIS — Z79899 Other long term (current) drug therapy: Secondary | ICD-10-CM | POA: Diagnosis not present

## 2016-08-31 DIAGNOSIS — Z9889 Other specified postprocedural states: Secondary | ICD-10-CM

## 2016-08-31 DIAGNOSIS — S83241A Other tear of medial meniscus, current injury, right knee, initial encounter: Secondary | ICD-10-CM | POA: Diagnosis not present

## 2016-08-31 DIAGNOSIS — G4730 Sleep apnea, unspecified: Secondary | ICD-10-CM | POA: Insufficient documentation

## 2016-08-31 DIAGNOSIS — M23203 Derangement of unspecified medial meniscus due to old tear or injury, right knee: Secondary | ICD-10-CM | POA: Diagnosis not present

## 2016-08-31 DIAGNOSIS — Z833 Family history of diabetes mellitus: Secondary | ICD-10-CM | POA: Insufficient documentation

## 2016-08-31 DIAGNOSIS — Z809 Family history of malignant neoplasm, unspecified: Secondary | ICD-10-CM | POA: Diagnosis not present

## 2016-08-31 DIAGNOSIS — Z87891 Personal history of nicotine dependence: Secondary | ICD-10-CM | POA: Diagnosis not present

## 2016-08-31 DIAGNOSIS — R569 Unspecified convulsions: Secondary | ICD-10-CM | POA: Insufficient documentation

## 2016-08-31 DIAGNOSIS — I10 Essential (primary) hypertension: Secondary | ICD-10-CM | POA: Insufficient documentation

## 2016-08-31 DIAGNOSIS — M23321 Other meniscus derangements, posterior horn of medial meniscus, right knee: Secondary | ICD-10-CM

## 2016-08-31 DIAGNOSIS — M1711 Unilateral primary osteoarthritis, right knee: Secondary | ICD-10-CM | POA: Diagnosis not present

## 2016-08-31 DIAGNOSIS — Z8249 Family history of ischemic heart disease and other diseases of the circulatory system: Secondary | ICD-10-CM | POA: Insufficient documentation

## 2016-08-31 HISTORY — PX: KNEE ARTHROSCOPY WITH MEDIAL MENISECTOMY: SHX5651

## 2016-08-31 HISTORY — PX: CHONDROPLASTY: SHX5177

## 2016-08-31 SURGERY — ARTHROSCOPY, KNEE, WITH MEDIAL MENISCECTOMY
Anesthesia: General | Site: Knee | Laterality: Right

## 2016-08-31 MED ORDER — MIDAZOLAM HCL 2 MG/2ML IJ SOLN
INTRAMUSCULAR | Status: AC
  Filled 2016-08-31: qty 2

## 2016-08-31 MED ORDER — SODIUM CHLORIDE 0.9 % IJ SOLN
INTRAMUSCULAR | Status: AC
  Filled 2016-08-31: qty 10

## 2016-08-31 MED ORDER — EPINEPHRINE PF 1 MG/ML IJ SOLN
INTRAMUSCULAR | Status: AC
  Filled 2016-08-31: qty 1

## 2016-08-31 MED ORDER — SODIUM CHLORIDE 0.9 % IR SOLN
Status: DC | PRN
  Administered 2016-08-31 (×3): 3000 mL

## 2016-08-31 MED ORDER — LIDOCAINE HCL 1 % IJ SOLN
INTRAMUSCULAR | Status: DC | PRN
  Administered 2016-08-31: 25 mg via INTRADERMAL

## 2016-08-31 MED ORDER — FENTANYL CITRATE (PF) 100 MCG/2ML IJ SOLN
INTRAMUSCULAR | Status: DC | PRN
  Administered 2016-08-31: 50 ug via INTRAVENOUS

## 2016-08-31 MED ORDER — LACTATED RINGERS IV SOLN
INTRAVENOUS | Status: DC
  Administered 2016-08-31: 1000 mL via INTRAVENOUS

## 2016-08-31 MED ORDER — PROPOFOL 10 MG/ML IV BOLUS
INTRAVENOUS | Status: AC
  Filled 2016-08-31: qty 40

## 2016-08-31 MED ORDER — EPHEDRINE SULFATE 50 MG/ML IJ SOLN
INTRAMUSCULAR | Status: AC
  Filled 2016-08-31: qty 1

## 2016-08-31 MED ORDER — MIDAZOLAM HCL 2 MG/2ML IJ SOLN
1.0000 mg | INTRAMUSCULAR | Status: AC
  Administered 2016-08-31: 2 mg via INTRAVENOUS

## 2016-08-31 MED ORDER — LIDOCAINE HCL (PF) 1 % IJ SOLN
INTRAMUSCULAR | Status: AC
  Filled 2016-08-31: qty 5

## 2016-08-31 MED ORDER — CHLORHEXIDINE GLUCONATE 4 % EX LIQD: 60.0000 mL | Freq: Once | CUTANEOUS | Status: DC

## 2016-08-31 MED ORDER — CEFAZOLIN SODIUM-DEXTROSE 2-4 GM/100ML-% IV SOLN
2.0000 g | INTRAVENOUS | Status: AC
  Administered 2016-08-31: 2 g via INTRAVENOUS
  Filled 2016-08-31: qty 100

## 2016-08-31 MED ORDER — MIDAZOLAM HCL 5 MG/5ML IJ SOLN
INTRAMUSCULAR | Status: DC | PRN
  Administered 2016-08-31: 2 mg via INTRAVENOUS

## 2016-08-31 MED ORDER — EPINEPHRINE PF 1 MG/ML IJ SOLN
INTRAMUSCULAR | Status: AC
  Filled 2016-08-31: qty 2

## 2016-08-31 MED ORDER — FENTANYL CITRATE (PF) 100 MCG/2ML IJ SOLN
INTRAMUSCULAR | Status: AC
Start: 2016-08-31 — End: ?
  Filled 2016-08-31: qty 4

## 2016-08-31 MED ORDER — BUPIVACAINE-EPINEPHRINE (PF) 0.5% -1:200000 IJ SOLN
INTRAMUSCULAR | Status: AC
  Filled 2016-08-31: qty 30

## 2016-08-31 MED ORDER — PROPOFOL 10 MG/ML IV BOLUS
INTRAVENOUS | Status: DC | PRN
  Administered 2016-08-31: 140 mg via INTRAVENOUS

## 2016-08-31 MED ORDER — OXYCODONE-ACETAMINOPHEN 5-325 MG PO TABS: 1.0000 | ORAL_TABLET | ORAL | 0 refills | Status: DC | PRN

## 2016-08-31 MED ORDER — BUPIVACAINE-EPINEPHRINE (PF) 0.5% -1:200000 IJ SOLN
INTRAMUSCULAR | Status: DC | PRN
  Administered 2016-08-31: 60 mL

## 2016-08-31 MED ORDER — FENTANYL CITRATE (PF) 100 MCG/2ML IJ SOLN: 25.0000 ug | INTRAMUSCULAR | Status: DC | PRN

## 2016-08-31 SURGICAL SUPPLY — 48 items
BAG HAMPER (MISCELLANEOUS) ×3 IMPLANT
BANDAGE ELASTIC 6 LF NS (GAUZE/BANDAGES/DRESSINGS) ×3 IMPLANT
BLADE AGGRESSIVE PLUS 4.0 (BLADE) ×3 IMPLANT
BLADE SURG SZ11 CARB STEEL (BLADE) ×3 IMPLANT
CHLORAPREP W/TINT 26ML (MISCELLANEOUS) ×3 IMPLANT
CLOTH BEACON ORANGE TIMEOUT ST (SAFETY) ×3 IMPLANT
COOLER CRYO IC GRAV AND TUBE (ORTHOPEDIC SUPPLIES) ×3 IMPLANT
CUFF CRYO KNEE18X23 MED (MISCELLANEOUS) ×3 IMPLANT
CUFF TOURNIQUET SINGLE 34IN LL (TOURNIQUET CUFF) ×3 IMPLANT
CUTTER ANGLED DBL BITE 4.5 (BURR) IMPLANT
DECANTER SPIKE VIAL GLASS SM (MISCELLANEOUS) ×6 IMPLANT
GAUZE SPONGE 4X4 12PLY STRL (GAUZE/BANDAGES/DRESSINGS) ×3 IMPLANT
GAUZE SPONGE 4X4 16PLY XRAY LF (GAUZE/BANDAGES/DRESSINGS) ×3 IMPLANT
GAUZE XEROFORM 5X9 LF (GAUZE/BANDAGES/DRESSINGS) ×3 IMPLANT
GLOVE BIOGEL PI IND STRL 7.0 (GLOVE) ×1 IMPLANT
GLOVE BIOGEL PI INDICATOR 7.0 (GLOVE) ×2
GLOVE SKINSENSE NS SZ8.0 LF (GLOVE) ×2
GLOVE SKINSENSE STRL SZ8.0 LF (GLOVE) ×1 IMPLANT
GLOVE SS N UNI LF 8.5 STRL (GLOVE) ×3 IMPLANT
GOWN STRL REUS W/ TWL LRG LVL3 (GOWN DISPOSABLE) ×1 IMPLANT
GOWN STRL REUS W/TWL LRG LVL3 (GOWN DISPOSABLE) ×2
GOWN STRL REUS W/TWL XL LVL3 (GOWN DISPOSABLE) ×3 IMPLANT
HLDR LEG FOAM (MISCELLANEOUS) ×1 IMPLANT
IV NS IRRIG 3000ML ARTHROMATIC (IV SOLUTION) ×9 IMPLANT
KIT BLADEGUARD II DBL (SET/KITS/TRAYS/PACK) ×3 IMPLANT
KIT ROOM TURNOVER AP CYSTO (KITS) ×3 IMPLANT
LEG HOLDER FOAM (MISCELLANEOUS) ×2
MANIFOLD NEPTUNE II (INSTRUMENTS) ×3 IMPLANT
MARKER SKIN DUAL TIP RULER LAB (MISCELLANEOUS) ×3 IMPLANT
NEEDLE HYPO 18GX1.5 BLUNT FILL (NEEDLE) ×3 IMPLANT
NEEDLE HYPO 21X1.5 SAFETY (NEEDLE) ×3 IMPLANT
NEEDLE SPNL 18GX3.5 QUINCKE PK (NEEDLE) ×3 IMPLANT
NS IRRIG 1000ML POUR BTL (IV SOLUTION) ×3 IMPLANT
PACK ARTHRO LIMB DRAPE STRL (MISCELLANEOUS) ×3 IMPLANT
PAD ABD 5X9 TENDERSORB (GAUZE/BANDAGES/DRESSINGS) ×3 IMPLANT
PAD ARMBOARD 7.5X6 YLW CONV (MISCELLANEOUS) ×3 IMPLANT
PADDING CAST COTTON 6X4 STRL (CAST SUPPLIES) ×3 IMPLANT
PADDING WEBRIL 6 STERILE (GAUZE/BANDAGES/DRESSINGS) ×3 IMPLANT
PROBE BIPOLAR 50 DEGREE SUCT (MISCELLANEOUS) ×3 IMPLANT
PROBE BIPOLAR ATHRO 135MM 90D (MISCELLANEOUS) IMPLANT
SET ARTHROSCOPY INST (INSTRUMENTS) ×3 IMPLANT
SET ARTHROSCOPY PUMP TUBE (IRRIGATION / IRRIGATOR) ×3 IMPLANT
SET BASIN LINEN APH (SET/KITS/TRAYS/PACK) ×3 IMPLANT
SUT ETHILON 3 0 FSL (SUTURE) ×3 IMPLANT
SYR 30ML LL (SYRINGE) ×3 IMPLANT
SYRINGE 10CC LL (SYRINGE) ×3 IMPLANT
TUBE CONNECTING 12'X1/4 (SUCTIONS) ×4
TUBE CONNECTING 12X1/4 (SUCTIONS) ×8 IMPLANT

## 2016-08-31 NOTE — Anesthesia Preprocedure Evaluation (Signed)
Anesthesia Evaluation  Patient identified by MRN, date of birth, ID band Patient awake    Reviewed: Allergy & Precautions, NPO status , Patient's Chart, lab work & pertinent test results  Airway Mallampati: II  TM Distance: >3 FB Neck ROM: Full    Dental  (+) Teeth Intact   Pulmonary sleep apnea and Continuous Positive Airway Pressure Ventilation , former smoker,    breath sounds clear to auscultation       Cardiovascular hypertension, Pt. on medications  Rhythm:Regular Rate:Normal     Neuro/Psych Seizures -, Well Controlled,  PSYCHIATRIC DISORDERS Anxiety    GI/Hepatic GERD  Controlled and Medicated,  Endo/Other    Renal/GU      Musculoskeletal  (+) Arthritis ,   Abdominal   Peds  Hematology   Anesthesia Other Findings   Reproductive/Obstetrics                             Anesthesia Physical Anesthesia Plan  ASA: III  Anesthesia Plan: General   Post-op Pain Management:    Induction: Intravenous  Airway Management Planned: LMA  Additional Equipment:   Intra-op Plan:   Post-operative Plan: Extubation in OR  Informed Consent: I have reviewed the patients History and Physical, chart, labs and discussed the procedure including the risks, benefits and alternatives for the proposed anesthesia with the patient or authorized representative who has indicated his/her understanding and acceptance.     Plan Discussed with:   Anesthesia Plan Comments:         Anesthesia Quick Evaluation

## 2016-08-31 NOTE — Anesthesia Procedure Notes (Signed)
Procedure Name: LMA Insertion Date/Time: 08/31/2016 7:38 AM Performed by: Charmaine Downs Pre-anesthesia Checklist: Patient identified, Patient being monitored, Emergency Drugs available, Timeout performed and Suction available Patient Re-evaluated:Patient Re-evaluated prior to inductionOxygen Delivery Method: Circle System Utilized Preoxygenation: Pre-oxygenation with 100% oxygen Intubation Type: IV induction Ventilation: Mask ventilation without difficulty LMA: LMA inserted LMA Size: 4.0 Number of attempts: 1 Placement Confirmation: positive ETCO2 and breath sounds checked- equal and bilateral Tube secured with: Tape Dental Injury: Teeth and Oropharynx as per pre-operative assessment

## 2016-08-31 NOTE — Interval H&P Note (Signed)
History and Physical Interval Note:  08/31/2016 7:12 AM  [name]  has presented today for surgery, with the diagnosis of RIGHT MEDIAL MENISCUS TEAR  The various methods of treatment have been discussed with the patient and family. After consideration of risks, benefits and other options for treatment, the patient has consented to  Procedure(s): KNEE ARTHROSCOPY WITH MEDIAL MENISECTOMY (Right) as a surgical intervention .  The patient's history has been reviewed, patient examined, no change in status, stable for surgery.  I have reviewed the patient's chart and labs.  Questions were answered to the patient's satisfaction.     Arther Abbott

## 2016-08-31 NOTE — Anesthesia Postprocedure Evaluation (Signed)
Anesthesia Post Note  Patient: Starlit Raburn Dimarzo  Procedure(s) Performed: Procedure(s) (LRB): KNEE ARTHROSCOPY WITH MEDIAL MENISECTOMY (Right) CHONDROPLASTY RIGHT FEMUR (Right)  Patient location during evaluation: PACU Anesthesia Type: General Level of consciousness: patient cooperative and awake Pain management: pain level controlled Vital Signs Assessment: post-procedure vital signs reviewed and stable Respiratory status: spontaneous breathing, nonlabored ventilation and respiratory function stable Cardiovascular status: blood pressure returned to baseline Postop Assessment: no signs of nausea or vomiting Anesthetic complications: no     Last Vitals:  Vitals:   08/31/16 0828 08/31/16 0830  BP: 116/82 115/85  Pulse: 96 89  Resp: (!) 22 (!) 22  Temp: 36.6 C     Last Pain:  Vitals:   08/31/16 0644  TempSrc: Oral  PainSc: 4                  Damaree Sargent J

## 2016-08-31 NOTE — Discharge Instructions (Signed)
Remove the Cryo/Cuff when you are walking  Remove the dressing tomorrow, apply band-aids  Start knee exercises by bending the knee 253 times a day on the second day after surgery  Apply weight to the operative knee and leg as tolerated

## 2016-08-31 NOTE — Telephone Encounter (Signed)
Patient called to inquire about discharge instructions from surgery today, 08/31/16, regarding cryo-cuff.  Asked about using it after today, "even when removal of dressing".  * * Spoke with nurse - advised yes, continue to use as instructed after removal of dressing, and to not wear when walking. Patient voiced understanding.  States doing well.

## 2016-08-31 NOTE — Transfer of Care (Signed)
Immediate Anesthesia Transfer of Care Note  Patient: [name]  Procedure(s) Performed: Procedure(s): KNEE ARTHROSCOPY WITH MEDIAL MENISECTOMY (Right) CHONDROPLASTY RIGHT FEMUR (Right)  Patient Location: PACU  Anesthesia Type:General  Level of Consciousness: drowsy and patient cooperative  Airway & Oxygen Therapy: Patient Spontanous Breathing and Patient connected to face mask oxygen  Post-op Assessment: Report given to RN and Post -op Vital signs reviewed and stable  Post vital signs: Reviewed and stable  Last Vitals:  Vitals:   08/31/16 0720 08/31/16 0725  BP: 108/75 111/76  Pulse:    Resp: 19 17  Temp:      Last Pain:  Vitals:   08/31/16 0644  TempSrc: Oral  PainSc: 4       Patients Stated Pain Goal: 9 (75/88/32 [id number])  Complications: No apparent anesthesia complications

## 2016-08-31 NOTE — Op Note (Signed)
08/31/2016  8:17 AM  PATIENT:  [name]  46 y.o. female  PRE-OPERATIVE DIAGNOSIS:  RIGHT MEDIAL MENISCUS TEAR  POST-OPERATIVE DIAGNOSIS:  RIGHT MEDIAL MENISCUS TEAR, CHONDROMALACIA RIGHT MEDIAL FEMORAL CONDYLE  PROCEDURE:  Procedure(s): KNEE ARTHROSCOPY WITH MEDIAL MENISECTOMY (Right) CHONDROPLASTY RIGHT FEMUR (Right)  Surgical findings. In the notch area we found the anterior cruciate ligament and PCL to be normal. On the lateral side we found the lateral meniscus and lateral femoral condyle and tibial plateau to be normal. In the patellofemoral area we found that the patella overhung the lateral condyle in extension and on flexion engagement of the central portion of the patella into the trochlea improved at 30 of flexion but never centralized. (As the patient had no major patellofemoral symptoms this was not altered) On the medial side we found a root tear of the medial meniscus and a grade 2 chondral flap of the medial femoral condyle toward the lateral portion of the condyle on the extension surface  Knee arthroscopy dictation  The patient was identified in the preoperative holding area using 2 approved identification mechanisms. The chart was reviewed and updated. The surgical site was confirmed as right knee and marked with an indelible marker.  The patient was taken to the operating room for anesthesia. After successful  general LMA anesthesia, Ancef 2 g was used as IV antibiotics.  The patient was placed in the supine position with the (right) the operative extremity in an arthroscopic leg holder and the opposite extremity in a padded leg holder.  The timeout was executed.  A lateral portal was established with an 11 blade and the scope was introduced into the joint. A diagnostic arthroscopy was performed in circumferential manner examining the entire knee joint. A medial portal was established and the diagnostic arthroscopy was repeated using a probe to palpate  intra-articular structures as they were encountered.    The medial meniscus was resected using a condyle med 50 probe as the tear was in the root of the meniscus   A chondroplasty was then performed on the chondral flap tear of the medial femoral condyle  The arthroscopic pump was placed on the wash mode and any excess debris was removed from the joint using suction.  60 cc of Marcaine with epinephrine was injected through the arthroscope.  The portals were closed with 3-0 nylon suture.  A sterile bandage, Ace wrap and Cryo/Cuff was placed and the Cryo/Cuff was activated. The patient was taken to the recovery room in stable condition.   SURGEON:  Surgeon(s) and Role:    * Carole Civil, MD - Primary  PHYSICIAN ASSISTANT:   ASSISTANTS: none   ANESTHESIA:   general  EBL:  Total I/O In: 600 [I.V.:600] Out: 5 [Blood:5]  BLOOD ADMINISTERED:none  DRAINS: none   LOCAL MEDICATIONS USED:  MARCAINE     SPECIMEN:  No Specimen  DISPOSITION OF SPECIMEN:  N/A  COUNTS:  YES  TOURNIQUET:    DICTATION: .Dragon Dictation  PLAN OF CARE: Discharge to home after PACU  PATIENT DISPOSITION:  PACU - hemodynamically stable.   Delay start of Pharmacological VTE agent (>24hrs) due to surgical blood loss or risk of bleeding: not applicable  [id number]

## 2016-08-31 NOTE — Brief Op Note (Signed)
08/31/2016  8:17 AM  PATIENT:  [name]  46 y.o. female  PRE-OPERATIVE DIAGNOSIS:  RIGHT MEDIAL MENISCUS TEAR  POST-OPERATIVE DIAGNOSIS:  RIGHT MEDIAL MENISCUS TEAR, CHONDROMALACIA RIGHT MEDIAL FEMORAL CONDYLE  PROCEDURE:  Procedure(s): KNEE ARTHROSCOPY WITH MEDIAL MENISECTOMY (Right) CHONDROPLASTY RIGHT FEMUR (Right)  Surgical findings. In the notch area we found the anterior cruciate ligament and PCL to be normal. On the lateral side we found the lateral meniscus and lateral femoral condyle and tibial plateau to be normal. In the patellofemoral area we found that the patella overhung the lateral condyle in extension and on flexion engagement of the central portion of the patella into the trochlea improved at 30 of flexion but never centralized. (As the patient had no major patellofemoral symptoms this was not altered) On the medial side we found a root tear of the medial meniscus and a grade 2 chondral flap of the medial femoral condyle toward the lateral portion of the condyle on the extension surface  Knee arthroscopy dictation  The patient was identified in the preoperative holding area using 2 approved identification mechanisms. The chart was reviewed and updated. The surgical site was confirmed as right knee and marked with an indelible marker.  The patient was taken to the operating room for anesthesia. After successful  general LMA anesthesia, Ancef 2 g was used as IV antibiotics.  The patient was placed in the supine position with the (right) the operative extremity in an arthroscopic leg holder and the opposite extremity in a padded leg holder.  The timeout was executed.  A lateral portal was established with an 11 blade and the scope was introduced into the joint. A diagnostic arthroscopy was performed in circumferential manner examining the entire knee joint. A medial portal was established and the diagnostic arthroscopy was repeated using a probe to palpate  intra-articular structures as they were encountered.    The medial meniscus was resected using a condyle med 50 probe as the tear was in the root of the meniscus   A chondroplasty was then performed on the chondral flap tear of the medial femoral condyle  The arthroscopic pump was placed on the wash mode and any excess debris was removed from the joint using suction.  60 cc of Marcaine with epinephrine was injected through the arthroscope.  The portals were closed with 3-0 nylon suture.  A sterile bandage, Ace wrap and Cryo/Cuff was placed and the Cryo/Cuff was activated. The patient was taken to the recovery room in stable condition.   SURGEON:  Surgeon(s) and Role:    * Carole Civil, MD - Primary  PHYSICIAN ASSISTANT:   ASSISTANTS: none   ANESTHESIA:   general  EBL:  Total I/O In: 600 [I.V.:600] Out: 5 [Blood:5]  BLOOD ADMINISTERED:none  DRAINS: none   LOCAL MEDICATIONS USED:  MARCAINE     SPECIMEN:  No Specimen  DISPOSITION OF SPECIMEN:  N/A  COUNTS:  YES  TOURNIQUET:    DICTATION: .Dragon Dictation  PLAN OF CARE: Discharge to home after PACU  PATIENT DISPOSITION:  PACU - hemodynamically stable.   Delay start of Pharmacological VTE agent (>24hrs) due to surgical blood loss or risk of bleeding: not applicable  [id number]

## 2016-09-01 ENCOUNTER — Encounter (HOSPITAL_COMMUNITY): Payer: Self-pay | Admitting: Orthopedic Surgery

## 2016-09-06 ENCOUNTER — Ambulatory Visit (INDEPENDENT_AMBULATORY_CARE_PROVIDER_SITE_OTHER): Payer: Self-pay | Admitting: Orthopedic Surgery

## 2016-09-06 ENCOUNTER — Encounter: Payer: Self-pay | Admitting: Orthopedic Surgery

## 2016-09-06 DIAGNOSIS — Z9889 Other specified postprocedural states: Secondary | ICD-10-CM

## 2016-09-06 DIAGNOSIS — Z4889 Encounter for other specified surgical aftercare: Secondary | ICD-10-CM

## 2016-09-06 NOTE — Progress Notes (Signed)
Postop visit #1 status post knee arthroscopy  Chief Complaint  Patient presents with  . Follow-up    [facility] 08/31/16    08/31/2016  8:17 AM  PATIENT:  [name]  46 y.o. female  PRE-OPERATIVE DIAGNOSIS:  RIGHT MEDIAL MENISCUS TEAR  POST-OPERATIVE DIAGNOSIS:  RIGHT MEDIAL MENISCUS TEAR, CHONDROMALACIA RIGHT MEDIAL FEMORAL CONDYLE  PROCEDURE:  Procedure(s): KNEE ARTHROSCOPY WITH MEDIAL MENISECTOMY (Right) CHONDROPLASTY RIGHT FEMUR (Right)   The portal sites were clean Band-Aids were applied her knee flexion is about 80 she has a mild effusion she is walking with crutches     Current Outpatient Prescriptions:  .  ALPRAZolam (XANAX) 0.5 MG tablet, Take 0.5 mg by mouth 2 (two) times daily as needed for anxiety. , Disp: , Rfl: 5 .  amLODipine (NORVASC) 10 MG tablet, Take 10 mg by mouth daily., Disp: , Rfl:  .  irbesartan (AVAPRO) 300 MG tablet, Take 300 mg by mouth daily., Disp: , Rfl: 12 .  linaclotide (LINZESS) 290 MCG CAPS capsule, Take 290 mcg by mouth daily before breakfast., Disp: , Rfl:  .  MYRBETRIQ 25 MG TB24 tablet, Take 25 mg by mouth daily. , Disp: , Rfl:  .  oxyCODONE-acetaminophen (PERCOCET/ROXICET) 5-325 MG tablet, Take 1 tablet by mouth every 4 (four) hours as needed., Disp: 15 tablet, Rfl: 0 .  pantoprazole (PROTONIX) 40 MG tablet, TAKE 1 TABLET (40 MG TOTAL) BY MOUTH 2 (TWO) TIMES DAILY BEFORE A MEAL., Disp: 60 tablet, Rfl: 4 .  phenylephrine (SUDAFED PE) 10 MG TABS tablet, Take 10 mg by mouth every 4 (four) hours as needed (cold symptoms)., Disp: , Rfl:  .  phenytoin (DILANTIN) 100 MG ER capsule, Take 200 mg by mouth 2 (two) times daily. , Disp: , Rfl:  .  polyethylene glycol (MIRALAX / GLYCOLAX) packet, Take 17 g by mouth daily as needed for mild constipation or moderate constipation. , Disp: , Rfl:   No orders of the defined types were placed in this encounter.   Encounter Diagnoses  Name Primary?  Marland Kitchen Aftercare following surgery Yes  . S/P right knee  arthroscopy      Start home exercise program return in 3 weeks

## 2016-09-06 NOTE — Patient Instructions (Signed)
START HOME EXERCISES   CONTINUE ICE 3 X A DAY UNTIL ALL SWELLING STOPS

## 2016-09-22 DIAGNOSIS — M545 Low back pain: Secondary | ICD-10-CM | POA: Diagnosis not present

## 2016-09-22 DIAGNOSIS — I10 Essential (primary) hypertension: Secondary | ICD-10-CM | POA: Diagnosis not present

## 2016-09-22 DIAGNOSIS — G4733 Obstructive sleep apnea (adult) (pediatric): Secondary | ICD-10-CM | POA: Diagnosis not present

## 2016-09-27 ENCOUNTER — Encounter: Payer: Self-pay | Admitting: Orthopedic Surgery

## 2016-09-27 ENCOUNTER — Ambulatory Visit (INDEPENDENT_AMBULATORY_CARE_PROVIDER_SITE_OTHER): Payer: Self-pay | Admitting: Orthopedic Surgery

## 2016-09-27 DIAGNOSIS — Z4889 Encounter for other specified surgical aftercare: Secondary | ICD-10-CM

## 2016-09-27 DIAGNOSIS — Z9889 Other specified postprocedural states: Secondary | ICD-10-CM

## 2016-09-27 NOTE — Progress Notes (Addendum)
Postop visit  Chief Complaint  Patient presents with  . Follow-up    [facility] MM, DOS 08/31/16    Postop day #27 (4 weeks)  Home exercise program for the last 3 weeks  PRE-OPERATIVE DIAGNOSIS:  RIGHT MEDIAL MENISCUS TEAR   POST-OPERATIVE DIAGNOSIS:  RIGHT MEDIAL MENISCUS TEAR, CHONDROMALACIA RIGHT MEDIAL FEMORAL CONDYLE   PROCEDURE:  Procedure(s): KNEE ARTHROSCOPY WITH MEDIAL MENISECTOMY (Right) CHONDROPLASTY RIGHT FEMUR (Right)  Cc: MY KNEE FEELS PRETTY GOOD  Right Knee Exam  Swelling: None Effusion: No  Tenderness  The patient is experiencing tenderness in the pes anserinus, MEDIAL PORTAL.  Range of Motion  Extension: Normal Flexion:     Normal  Muscle Strength  Normal right knee strength      Encounter Diagnoses  Name Primary?  Marland Kitchen Aftercare following surgery Yes  . S/P right knee arthroscopy     RELEASED

## 2016-12-01 ENCOUNTER — Other Ambulatory Visit: Payer: Self-pay | Admitting: Gastroenterology

## 2016-12-08 DIAGNOSIS — L570 Actinic keratosis: Secondary | ICD-10-CM | POA: Diagnosis not present

## 2016-12-08 DIAGNOSIS — X32XXXA Exposure to sunlight, initial encounter: Secondary | ICD-10-CM | POA: Diagnosis not present

## 2016-12-08 DIAGNOSIS — L820 Inflamed seborrheic keratosis: Secondary | ICD-10-CM | POA: Diagnosis not present

## 2016-12-20 DIAGNOSIS — G4733 Obstructive sleep apnea (adult) (pediatric): Secondary | ICD-10-CM | POA: Diagnosis not present

## 2016-12-20 DIAGNOSIS — I10 Essential (primary) hypertension: Secondary | ICD-10-CM | POA: Diagnosis not present

## 2016-12-20 DIAGNOSIS — M545 Low back pain: Secondary | ICD-10-CM | POA: Diagnosis not present

## 2016-12-26 DIAGNOSIS — I10 Essential (primary) hypertension: Secondary | ICD-10-CM | POA: Diagnosis not present

## 2016-12-26 DIAGNOSIS — M545 Low back pain: Secondary | ICD-10-CM | POA: Diagnosis not present

## 2016-12-26 DIAGNOSIS — G4733 Obstructive sleep apnea (adult) (pediatric): Secondary | ICD-10-CM | POA: Diagnosis not present

## 2017-01-10 DIAGNOSIS — G4733 Obstructive sleep apnea (adult) (pediatric): Secondary | ICD-10-CM | POA: Diagnosis not present

## 2017-02-17 ENCOUNTER — Emergency Department (HOSPITAL_COMMUNITY)
Admission: EM | Admit: 2017-02-17 | Discharge: 2017-02-17 | Disposition: A | Discharge status: Home / Self Care | Payer: Medicare Other | Attending: Emergency Medicine | Admitting: Emergency Medicine

## 2017-02-17 ENCOUNTER — Emergency Department (HOSPITAL_COMMUNITY): Payer: Medicare Other

## 2017-02-17 ENCOUNTER — Encounter (HOSPITAL_COMMUNITY): Payer: Self-pay | Admitting: Emergency Medicine

## 2017-02-17 DIAGNOSIS — Y939 Activity, unspecified: Secondary | ICD-10-CM | POA: Diagnosis not present

## 2017-02-17 DIAGNOSIS — S39012A Strain of muscle, fascia and tendon of lower back, initial encounter: Secondary | ICD-10-CM | POA: Diagnosis not present

## 2017-02-17 DIAGNOSIS — S8011XA Contusion of right lower leg, initial encounter: Secondary | ICD-10-CM | POA: Insufficient documentation

## 2017-02-17 DIAGNOSIS — W109XXA Fall (on) (from) unspecified stairs and steps, initial encounter: Secondary | ICD-10-CM | POA: Diagnosis not present

## 2017-02-17 DIAGNOSIS — Y999 Unspecified external cause status: Secondary | ICD-10-CM | POA: Insufficient documentation

## 2017-02-17 DIAGNOSIS — W19XXXA Unspecified fall, initial encounter: Secondary | ICD-10-CM

## 2017-02-17 DIAGNOSIS — I10 Essential (primary) hypertension: Secondary | ICD-10-CM | POA: Insufficient documentation

## 2017-02-17 DIAGNOSIS — S99911A Unspecified injury of right ankle, initial encounter: Secondary | ICD-10-CM | POA: Diagnosis present

## 2017-02-17 DIAGNOSIS — Z79899 Other long term (current) drug therapy: Secondary | ICD-10-CM | POA: Diagnosis not present

## 2017-02-17 DIAGNOSIS — M79661 Pain in right lower leg: Secondary | ICD-10-CM | POA: Diagnosis not present

## 2017-02-17 DIAGNOSIS — S93491A Sprain of other ligament of right ankle, initial encounter: Secondary | ICD-10-CM | POA: Diagnosis not present

## 2017-02-17 DIAGNOSIS — Z87891 Personal history of nicotine dependence: Secondary | ICD-10-CM | POA: Insufficient documentation

## 2017-02-17 DIAGNOSIS — S93401A Sprain of unspecified ligament of right ankle, initial encounter: Secondary | ICD-10-CM | POA: Diagnosis not present

## 2017-02-17 DIAGNOSIS — Y92009 Unspecified place in unspecified non-institutional (private) residence as the place of occurrence of the external cause: Secondary | ICD-10-CM | POA: Insufficient documentation

## 2017-02-17 DIAGNOSIS — S3992XA Unspecified injury of lower back, initial encounter: Secondary | ICD-10-CM | POA: Diagnosis not present

## 2017-02-17 DIAGNOSIS — M25561 Pain in right knee: Secondary | ICD-10-CM | POA: Diagnosis not present

## 2017-02-17 DIAGNOSIS — S8991XA Unspecified injury of right lower leg, initial encounter: Secondary | ICD-10-CM | POA: Diagnosis not present

## 2017-02-17 HISTORY — DX: Unspecified abdominal pain: R10.9

## 2017-02-17 HISTORY — DX: Difficulty in walking, not elsewhere classified: R26.2

## 2017-02-17 HISTORY — DX: Constipation, unspecified: K59.00

## 2017-02-17 HISTORY — DX: Low back pain, unspecified: M54.50

## 2017-02-17 HISTORY — DX: Headache: R51

## 2017-02-17 HISTORY — DX: Pain in unspecified knee: M25.569

## 2017-02-17 HISTORY — DX: Low back pain: M54.5

## 2017-02-17 HISTORY — DX: Other abnormalities of gait and mobility: R26.89

## 2017-02-17 HISTORY — DX: Headache, unspecified: R51.9

## 2017-02-17 HISTORY — DX: Other chronic pain: G89.29

## 2017-02-17 LAB — BASIC METABOLIC PANEL
ANION GAP: 9 (ref 5–15)
BUN: 9 mg/dL (ref 6–20)
CALCIUM: 9.2 mg/dL (ref 8.9–10.3)
CO2: 27 mmol/L (ref 22–32)
Chloride: 100 mmol/L — ABNORMAL LOW (ref 101–111)
Creatinine, Ser: 0.67 mg/dL (ref 0.44–1.00)
GLUCOSE: 88 mg/dL (ref 65–99)
Potassium: 3.6 mmol/L (ref 3.5–5.1)
SODIUM: 136 mmol/L (ref 135–145)

## 2017-02-17 LAB — CBC WITH DIFFERENTIAL/PLATELET
BASOS PCT: 0 %
Basophils Absolute: 0 10*3/uL (ref 0.0–0.1)
EOS ABS: 0.1 10*3/uL (ref 0.0–0.7)
EOS PCT: 1 %
HCT: 35.1 % — ABNORMAL LOW (ref 36.0–46.0)
Hemoglobin: 12.1 g/dL (ref 12.0–15.0)
LYMPHS ABS: 1.3 10*3/uL (ref 0.7–4.0)
Lymphocytes Relative: 18 %
MCH: 30.1 pg (ref 26.0–34.0)
MCHC: 34.5 g/dL (ref 30.0–36.0)
MCV: 87.3 fL (ref 78.0–100.0)
MONO ABS: 0.6 10*3/uL (ref 0.1–1.0)
MONOS PCT: 9 %
Neutro Abs: 5.1 10*3/uL (ref 1.7–7.7)
Neutrophils Relative %: 72 %
Platelets: 203 10*3/uL (ref 150–400)
RBC: 4.02 MIL/uL (ref 3.87–5.11)
RDW: 13.8 % (ref 11.5–15.5)
WBC: 7 10*3/uL (ref 4.0–10.5)

## 2017-02-17 LAB — PHENYTOIN LEVEL, TOTAL: PHENYTOIN LVL: 21.5 ug/mL — AB (ref 10.0–20.0)

## 2017-02-17 MED ORDER — HYDROCODONE-ACETAMINOPHEN 5-325 MG PO TABS
1.0000 | ORAL_TABLET | Freq: Once | ORAL | Status: AC
  Administered 2017-02-17: 1 via ORAL
  Filled 2017-02-17: qty 1

## 2017-02-17 MED ORDER — IBUPROFEN 600 MG PO TABS: 600.0000 mg | ORAL_TABLET | Freq: Four times a day (QID) | ORAL | 0 refills | Status: DC | PRN

## 2017-02-17 MED ORDER — CYCLOBENZAPRINE HCL 10 MG PO TABS: 10.0000 mg | ORAL_TABLET | Freq: Three times a day (TID) | ORAL | 0 refills | Status: DC | PRN

## 2017-02-17 NOTE — ED Triage Notes (Signed)
Fall on ice this morning and fell down 4 steps.  Pt states she did lose consciousness.  Witnessed fall by family and per family pt may have had a seizure.  Pt c/o right leg pain.

## 2017-02-17 NOTE — ED Notes (Signed)
Pt states she passed out after she went back inside after the fall.

## 2017-02-17 NOTE — Discharge Instructions (Signed)
Elevate your leg when possible.  Crutches for weightbearing as needed.  Call Dr. Ruthe Mannan office to arrange a follow-up appointment in 1 week if needed.  Return to the ER for any worsening symptoms.

## 2017-02-18 NOTE — ED Provider Notes (Signed)
[facility] EMERGENCY DEPARTMENT Provider Note   CSN: [id number] Arrival date & time: 02/17/17  0901     History   Chief Complaint Chief Complaint  Patient presents with  . Fall    HPI [name] is a 46 y.o. female.  HPI   [name] is a 46 y.o. female who presents to the Emergency Department complaining of pain to her right lower leg and right lower back secondary to a fall on steps.  Reports a mechanical fall on ice that occurred around 8:30 am on the morning prior to arrival.  She denies head injury and neck pain, but states that she may have "blacked out for a minute or so from the pain."  patient recalls the fall and remembers going inside the house.  Family member described a witnessed seizure lasting approximately one minute, describes a "shaking all over" family member states that she was speaking coherently after. patient denies incontinence, tongue biting, and visual changes.  Currently takes Dilantin, no missed doses recently.  Pt denies lethargy, dizziness, speech difficulty, weakness, vomiting, and headache.     Past Medical History:  Diagnosis Date  . Anxiety   . Arthritis   . Balance problems   . Chronic abdominal pain   . Chronic knee pain   . Constipation   . Difficulty walking   . GERD (gastroesophageal reflux disease)   . Headache   . HTN (hypertension)   . Low back pain   . Panic attacks   . Polyuria   . Seizures ([place])    unknown etiology; on dilantin; last seizures was a few years ago.  . Sleep apnea     Patient Active Problem List   Diagnosis Date Noted  . 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 Primary osteoarthritis of right knee   . Gastric polyp   . GERD (gastroesophageal reflux disease) 08/24/2015  . Hypoactive sexual desire disorder 05/14/2014  . Abdominal pain, chronic, epigastric 12/08/2013  . Constipation 12/08/2013  . Esophageal dysphagia 02/22/2013  . Abdominal pain, epigastric  02/22/2013  . LUQ pain 02/22/2013  . Difficulty in walking(719.7) 01/25/2012  . Stiffness of joint, not elsewhere classified, ankle and foot 01/25/2012  . Balance problems 01/25/2012  . Ankle sprain 01/17/2012  . Ankle pain, right 01/17/2012    Past Surgical History:  Procedure Laterality Date  . BACK SURGERY     complicated by ureteral injury  . CHONDROPLASTY RIGHT FEMUR Right 08/31/2016   Performed by Carole Civil, MD at [facility]  . ESOPHAGOGASTRODUODENOSCOPY (EGD) N/A 09/11/2015   Performed by Daneil Dolin, MD at [place]  . ESOPHAGOGASTRODUODENOSCOPY (EGD) WITH ESOPHAGEAL DILATION N/A 03/20/2013   Performed by Daneil Dolin, MD at [place]  . KNEE ARTHROSCOPY WITH MEDIAL MENISECTOMY Right 08/31/2016   Performed by Carole Civil, MD at [facility]  . MALONEY DILATION N/A 09/11/2015   Performed by Daneil Dolin, MD at [place]    OB History    No data available       Home Medications    Prior to Admission medications   Medication Sig Start Date End Date Taking? Authorizing Provider  ALPRAZolam Duanne Moron) 0.5 MG tablet Take 0.5 mg by mouth 2 (two) times daily as needed for anxiety.  04/30/14  Yes [provider]  amLODipine (NORVASC) 10 MG tablet Take 10 mg by mouth daily.   Yes [provider]  irbesartan (  AVAPRO) 300 MG tablet Take 300 mg by mouth daily. 07/03/14  Yes [provider]  linaclotide (LINZESS) 290 MCG CAPS capsule Take 290 mcg by mouth daily before breakfast.   Yes [provider]  MYRBETRIQ 25 MG TB24 tablet Take 25 mg by mouth daily.  10/20/15  Yes [provider]  ondansetron (ZOFRAN) 4 MG tablet Take 1 tablet 4 (four) times daily as needed by mouth for nausea/vomiting. 12/20/16  Yes [provider]  oxyCODONE-acetaminophen (PERCOCET/ROXICET) 5-325 MG tablet Take 1 tablet by mouth every 4 (four) hours as needed. 08/31/16  Yes Carole Civil, MD  pantoprazole (PROTONIX) 40 MG tablet  TAKE 1 TABLET (40 MG TOTAL) BY MOUTH 2 (TWO) TIMES DAILY BEFORE A MEAL. 12/01/16  Yes Annitta Needs, NP  phenylephrine (SUDAFED PE) 10 MG TABS tablet Take 10 mg by mouth every 4 (four) hours as needed (cold symptoms).   Yes [provider]  phenytoin (DILANTIN) 100 MG ER capsule Take 200 mg by mouth 2 (two) times daily.    Yes [provider]  polyethylene glycol (MIRALAX / GLYCOLAX) packet Take 17 g by mouth daily as needed for mild constipation or moderate constipation.    Yes [provider]  cyclobenzaprine (FLEXERIL) 10 MG tablet Take 1 tablet (10 mg total) 3 (three) times daily as needed by mouth. 02/17/17   Con Arganbright, PA-C  ibuprofen (ADVIL,MOTRIN) 600 MG tablet Take 1 tablet (600 mg total) every 6 (six) hours as needed by mouth. 02/17/17   Dasani Thurlow, PA-C    Family History Family History  Problem Relation Age of Onset  . Throat cancer Father   . Heart disease Father   . Cancer Father   . Hypertension Father   . Diabetes Father   . Other Mother        esophageal stricture and gallbladder disease  . Thyroid disease Mother   . Other Sister        esophageal stricture and gallbladder problems.   . Diabetes Sister   . Hypertension Sister   . Thyroid disease Sister   . Heart disease Maternal Grandfather   . Diabetes Maternal Grandfather   . Arthritis Maternal Grandfather   . Colon cancer Neg Hx   . Liver disease Neg Hx   . Pancreatic cancer Neg Hx     Social History Social History   Tobacco Use  . Smoking status: Former Smoker    Packs/day: 2.00    Years: 9.00    Pack years: 18.00    Types: Cigarettes    Last attempt to quit: 04/04/1994    Years since quitting: 22.8  . Smokeless tobacco: Never Used  . Tobacco comment: smoked 8 years, quit 18 years ago in the 1996  Substance Use Topics  . Alcohol use: No    Alcohol/week: 0.0 oz  . Drug use: No     Allergies   Benadryl [diphenhydramine hcl]   Review of Systems Review of  Systems  Constitutional: Negative for chills and fever.  HENT: Negative for trouble swallowing.   Eyes: Negative for visual disturbance.  Respiratory: Negative for shortness of breath.   Cardiovascular: Negative for chest pain.  Gastrointestinal: Negative for nausea and vomiting.  Genitourinary: Negative for difficulty urinating and dysuria.  Musculoskeletal: Positive for arthralgias and back pain. Negative for joint swelling, neck pain and neck stiffness.  Skin: Negative for color change and wound.       Abrasion right lower leg  Neurological: Positive for seizures.  Negative for dizziness, facial asymmetry, speech difficulty, weakness, light-headedness and headaches.       "blacked out for a minute or so from pain"  All other systems reviewed and are negative.    Physical Exam Updated Vital Signs BP 115/89   Pulse 87   Temp 98.3 F (36.8 C) (Oral)   Resp 18   Ht 5' 6"  (1.676 m)   Wt 73.9 kg (163 lb)   LMP 01/17/2017   SpO2 100%   BMI 26.31 kg/m   Physical Exam  Constitutional: She is oriented to person, place, and time. She appears well-developed and well-nourished. No distress.  HENT:  Head: Normocephalic and atraumatic.  Mouth/Throat: Oropharynx is clear and moist.  Eyes: Conjunctivae and EOM are normal. Pupils are equal, round, and reactive to light.  Neck: Normal range of motion. Neck supple.  Cardiovascular: Normal rate, regular rhythm and intact distal pulses.  Pulmonary/Chest: Effort normal and breath sounds normal. No respiratory distress. She exhibits no tenderness.  Abdominal: Soft. She exhibits no distension. There is no tenderness.  Musculoskeletal: She exhibits tenderness. She exhibits no edema.  ttp of the right lumbar paraspinal muscles.  5/5 strength of BLE's.  Right hip NT, full ROM.  ttp of the right lower leg and right ankle.  Compartments soft.  No edema  Neurological: She is alert and oriented to person, place, and time. She has normal strength. No  cranial nerve deficit or sensory deficit. Coordination and gait normal. GCS eye subscore is 4. GCS verbal subscore is 5. GCS motor subscore is 6.  CN II-XII intact.  Speech clear, no pronator drift, no motor weakness  Nursing note and vitals reviewed.    ED Treatments / Results  Labs (all labs ordered are listed, but only abnormal results are displayed) Labs Reviewed  CBC WITH DIFFERENTIAL/PLATELET - Abnormal; Notable for the following components:      Result Value   HCT 35.1 (*)    All other components within normal limits  BASIC METABOLIC PANEL - Abnormal; Notable for the following components:   Chloride 100 (*)    All other components within normal limits  PHENYTOIN LEVEL, TOTAL - Abnormal; Notable for the following components:   Phenytoin Lvl 21.5 (*)    All other components within normal limits    EKG  EKG Interpretation  Date/Time:  Friday February 17 2017 09:07:57 EST Ventricular Rate:  90 PR Interval:    QRS Duration: 90 QT Interval:  367 QTC Calculation: 449 R Axis:   124 Text Interpretation:  Sinus rhythm Right axis deviation Low voltage, precordial leads Baseline wander Artifact When compared with ECG of 08/24/2016 Rate slower Confirmed by Francine Graven [phone]) on 02/17/2017 11:41:10 AM       Radiology Dg Lumbar Spine Complete  Result Date: 02/17/2017 CLINICAL DATA:  Fall.  Pain lower extremity.  Pain right knee. EXAM: LUMBAR SPINE - COMPLETE 4+ VIEW COMPARISON:  01/09/2016 . FINDINGS: Air-filled loops of small and large bowel noted. Mild adynamic ileus cannot be excluded. Diffuse mild degenerative change. No acute bony abnormality identified. Prior L4-L5 inter disc fusion. IMPRESSION: 1. Prior L4-L5 inter disc fusion. Anatomic alignment. Diffuse degenerative change. No acute bony abnormality . 2. Air-filled loops of nondistended small and large bowel noted. Mild adynamic ileus cannot be excluded . Electronically Signed   By: Marcello Moores  Register   On: 02/17/2017  11:21   Dg Tibia/fibula Right  Result Date: 02/17/2017 CLINICAL DATA:  Slipped and fell.  Lower extremity pain. EXAM: RIGHT  TIBIA AND FIBULA - 2 VIEW COMPARISON:  None. FINDINGS: There is no evidence of fracture or other focal bone lesions. Soft tissues are unremarkable. IMPRESSION: Normal Electronically Signed   By: Nelson Chimes M.D.   On: 02/17/2017 11:22   Dg Knee Complete 4 Views Right  Result Date: 02/17/2017 CLINICAL DATA:  Slipped and fell with subsequent knee pain. EXAM: RIGHT KNEE - COMPLETE 4+ VIEW COMPARISON:  None. FINDINGS: No evidence of fracture, dislocation, or joint effusion. No evidence of arthropathy or other focal bone abnormality. Soft tissues are unremarkable. IMPRESSION: Normal Electronically Signed   By: Nelson Chimes M.D.   On: 02/17/2017 11:21    Procedures Procedures (including critical care time)  Medications Ordered in ED Medications  HYDROcodone-acetaminophen (NORCO/VICODIN) 5-325 MG per tablet 1 tablet (1 tablet Oral Given 02/17/17 1039)     Initial Impression / Assessment and Plan / ED Course  I have reviewed the triage vital signs and the nursing notes.  Pertinent labs & imaging results that were available during my care of the patient were reviewed by me and considered in my medical decision making (see chart for details).     Pt is alert, no focal neuro deficits, NV intact. no reported HA, vomiting, dysarthria, and no post ictal sx's.  XR's neg for fx.  Labs reassuring and dilatin level therapeutic. Pt only complains of musculoskeletal injuries and no head injury reported. NEXUS II CT Head rules considered.  Pt reports feeling better after medication and states she is ready for d/c.  Appears stable for d/c. Strict return precautions discussed.    Crutches given and ASO applied.      Final Clinical Impressions(s) / ED Diagnoses   Final diagnoses:  Fall, initial encounter  Sprain of right ankle, unspecified ligament, initial encounter  Contusion  of right lower leg, initial encounter  Strain of lumbar region, initial encounter    ED Discharge Orders        Ordered    ibuprofen (ADVIL,MOTRIN) 600 MG tablet  Every 6 hours PRN     02/17/17 1309    cyclobenzaprine (FLEXERIL) 10 MG tablet  3 times daily PRN     02/17/17 1309       Kem Parkinson, PA-C 02/18/17 [place], [place], DO 02/20/17 2119

## 2017-03-21 DIAGNOSIS — Z0000 Encounter for general adult medical examination without abnormal findings: Secondary | ICD-10-CM | POA: Diagnosis not present

## 2017-03-22 DIAGNOSIS — I10 Essential (primary) hypertension: Secondary | ICD-10-CM | POA: Diagnosis not present

## 2017-03-22 DIAGNOSIS — E785 Hyperlipidemia, unspecified: Secondary | ICD-10-CM | POA: Diagnosis not present

## 2017-03-22 DIAGNOSIS — G4730 Sleep apnea, unspecified: Secondary | ICD-10-CM | POA: Diagnosis not present

## 2017-03-22 DIAGNOSIS — Z0000 Encounter for general adult medical examination without abnormal findings: Secondary | ICD-10-CM | POA: Diagnosis not present

## 2017-04-10 ENCOUNTER — Other Ambulatory Visit (HOSPITAL_BASED_OUTPATIENT_CLINIC_OR_DEPARTMENT_OTHER): Payer: Self-pay

## 2017-04-10 DIAGNOSIS — G4733 Obstructive sleep apnea (adult) (pediatric): Secondary | ICD-10-CM

## 2017-04-18 ENCOUNTER — Other Ambulatory Visit: Payer: Self-pay | Admitting: *Deleted

## 2017-04-19 MED ORDER — PANTOPRAZOLE SODIUM 40 MG PO TBEC: DELAYED_RELEASE_TABLET | ORAL | 1 refills | Status: DC

## 2017-04-25 ENCOUNTER — Ambulatory Visit: Payer: Medicare Other | Attending: Pulmonary Disease | Admitting: Neurology

## 2017-04-25 DIAGNOSIS — G4733 Obstructive sleep apnea (adult) (pediatric): Secondary | ICD-10-CM | POA: Diagnosis not present

## 2017-04-30 NOTE — Procedures (Signed)
[place] A. Merlene Laughter, MD     [email or web address]             NOCTURNAL POLYSOMNOGRAPHY   LOCATION: [facility]    Patient Name: [name], [name] Date: 04/25/2017 Gender: Female D.O.B: Jul 17, 1970 Age (years): 46 Referring Provider: Sinda Du Height (inches): 81 Interpreting Physician: Phillips Odor MD, ABSM Weight (lbs): 165 RPSGT: Rosebud Poles BMI: 26 MRN: [id number] Neck Size: 16.00 CLINICAL INFORMATION Sleep Study Type: NPSG     Indication for sleep study: OSA     Epworth Sleepiness Score: 8     SLEEP STUDY TECHNIQUE As per the AASM Manual for the Scoring of Sleep and Associated Events v2.3 (April 2016) with a hypopnea requiring 4% desaturations.  The channels recorded and monitored were frontal, central and occipital EEG, electrooculogram (EOG), submentalis EMG (chin), nasal and oral airflow, thoracic and abdominal wall motion, anterior tibialis EMG, snore microphone, electrocardiogram, and pulse oximetry.  MEDICATIONS Medications self-administered by patient taken the night of the study : N/A  Current Outpatient Medications:  .  ALPRAZolam (XANAX) 0.5 MG tablet, Take 0.5 mg by mouth 2 (two) times daily as needed for anxiety. , Disp: , Rfl: 5 .  amLODipine (NORVASC) 10 MG tablet, Take 10 mg by mouth daily., Disp: , Rfl:  .  cyclobenzaprine (FLEXERIL) 10 MG tablet, Take 1 tablet (10 mg total) 3 (three) times daily as needed by mouth., Disp: 21 tablet, Rfl: 0 .  ibuprofen (ADVIL,MOTRIN) 600 MG tablet, Take 1 tablet (600 mg total) every 6 (six) hours as needed by mouth., Disp: 30 tablet, Rfl: 0 .  irbesartan (AVAPRO) 300 MG tablet, Take 300 mg by mouth daily., Disp: , Rfl: 12 .  linaclotide (LINZESS) 290 MCG CAPS capsule, Take 290 mcg by mouth daily before breakfast., Disp: , Rfl:  .  MYRBETRIQ 25 MG TB24 tablet, Take 25 mg by mouth daily. , Disp: , Rfl:  .  ondansetron (ZOFRAN) 4 MG tablet, Take 1 tablet 4 (four) times daily as  needed by mouth for nausea/vomiting., Disp: , Rfl: 3 .  oxyCODONE-acetaminophen (PERCOCET/ROXICET) 5-325 MG tablet, Take 1 tablet by mouth every 4 (four) hours as needed., Disp: 15 tablet, Rfl: 0 .  pantoprazole (PROTONIX) 40 MG tablet, TAKE 1 TABLET (40 MG TOTAL) BY MOUTH 2 (TWO) TIMES DAILY BEFORE A MEAL., Disp: 180 tablet, Rfl: 1 .  phenylephrine (SUDAFED PE) 10 MG TABS tablet, Take 10 mg by mouth every 4 (four) hours as needed (cold symptoms)., Disp: , Rfl:  .  phenytoin (DILANTIN) 100 MG ER capsule, Take 200 mg by mouth 2 (two) times daily. , Disp: , Rfl:  .  polyethylene glycol (MIRALAX / GLYCOLAX) packet, Take 17 g by mouth daily as needed for mild constipation or moderate constipation. , Disp: , Rfl:      SLEEP ARCHITECTURE The study was initiated at 10:39:37 PM and ended at 5:01:44 AM.  Sleep onset time was 14.3 minutes and the sleep efficiency was 68.8%. The total sleep time was 262.8 minutes.  Stage REM latency was 66.5 minutes.  The patient spent 2.47% of the night in stage N1 sleep, 35.38% in stage N2 sleep, 35.95% in stage N3 and 26.19% in REM.  Alpha intrusion was absent.  Supine sleep was 28.64%.  RESPIRATORY PARAMETERS The overall apnea/hypopnea index (AHI) was 16.4 per hour. There were 11 total apneas, including 10 obstructive, 0 central and 1 mixed apneas. There were 61 hypopneas and 0 RERAs.  The AHI during Stage REM sleep was 41.0 per  hour.  AHI while supine was 48.6 per hour.  The mean oxygen saturation was 94.31%. The minimum SpO2 during sleep was 79.00%.  soft snoring was noted during this study.  CARDIAC DATA The 2 lead EKG demonstrated sinus rhythm. The mean heart rate was 80.28 beats per minute. Other EKG findings include: None. LEG MOVEMENT DATA The total PLMS were 96 with a resulting PLMS index of 21.91. Associated arousal with leg movement index was 3.7.  IMPRESSIONS Moderate obstructive sleep apnea is documented. AutoPAP 5-10 is recommended.    Delano Metz, MD Diplomate, American Board of Sleep Medicine.  ELECTRONICALLY SIGNED ON:  04/30/2017, 9:03 AM [facility] PH: [phone]   FX: [phone] [place]

## 2017-06-20 ENCOUNTER — Other Ambulatory Visit (HOSPITAL_COMMUNITY): Payer: Self-pay | Admitting: Pulmonary Disease

## 2017-06-20 DIAGNOSIS — I10 Essential (primary) hypertension: Secondary | ICD-10-CM | POA: Diagnosis not present

## 2017-06-20 DIAGNOSIS — R1011 Right upper quadrant pain: Secondary | ICD-10-CM

## 2017-06-20 DIAGNOSIS — G4730 Sleep apnea, unspecified: Secondary | ICD-10-CM | POA: Diagnosis not present

## 2017-06-20 DIAGNOSIS — M545 Low back pain: Secondary | ICD-10-CM | POA: Diagnosis not present

## 2017-06-23 ENCOUNTER — Ambulatory Visit (HOSPITAL_COMMUNITY)
Admission: RE | Admit: 2017-06-23 | Discharge: 2017-06-23 | Disposition: A | Discharge status: Home / Self Care | Payer: Medicare Other | Source: Ambulatory Visit | Attending: Pulmonary Disease | Admitting: Pulmonary Disease

## 2017-06-23 DIAGNOSIS — R1011 Right upper quadrant pain: Secondary | ICD-10-CM | POA: Diagnosis not present

## 2017-06-26 ENCOUNTER — Other Ambulatory Visit (HOSPITAL_COMMUNITY): Payer: Self-pay | Admitting: Pulmonary Disease

## 2017-06-26 DIAGNOSIS — R109 Unspecified abdominal pain: Secondary | ICD-10-CM

## 2017-06-29 ENCOUNTER — Other Ambulatory Visit (HOSPITAL_COMMUNITY): Payer: Self-pay

## 2017-06-30 ENCOUNTER — Encounter (HOSPITAL_COMMUNITY): Payer: Self-pay

## 2017-06-30 ENCOUNTER — Ambulatory Visit (HOSPITAL_COMMUNITY)
Admission: RE | Admit: 2017-06-30 | Discharge: 2017-06-30 | Disposition: A | Discharge status: Home / Self Care | Payer: Medicare Other | Source: Ambulatory Visit | Attending: Pulmonary Disease | Admitting: Pulmonary Disease

## 2017-06-30 DIAGNOSIS — R109 Unspecified abdominal pain: Secondary | ICD-10-CM | POA: Diagnosis not present

## 2017-06-30 DIAGNOSIS — R112 Nausea with vomiting, unspecified: Secondary | ICD-10-CM | POA: Diagnosis not present

## 2017-06-30 MED ORDER — TECHNETIUM TC 99M MEBROFENIN IV KIT
5.0000 | PACK | Freq: Once | INTRAVENOUS | Status: AC | PRN
  Administered 2017-06-30: 5.2 via INTRAVENOUS

## 2017-07-04 ENCOUNTER — Other Ambulatory Visit (HOSPITAL_COMMUNITY): Payer: Self-pay | Admitting: Pulmonary Disease

## 2017-07-04 DIAGNOSIS — Z1231 Encounter for screening mammogram for malignant neoplasm of breast: Secondary | ICD-10-CM

## 2017-07-14 ENCOUNTER — Ambulatory Visit (HOSPITAL_COMMUNITY)
Admission: RE | Admit: 2017-07-14 | Discharge: 2017-07-14 | Disposition: A | Discharge status: Home / Self Care | Payer: Medicare Other | Source: Ambulatory Visit | Attending: Pulmonary Disease | Admitting: Pulmonary Disease

## 2017-07-14 ENCOUNTER — Encounter (HOSPITAL_COMMUNITY): Payer: Self-pay

## 2017-07-14 DIAGNOSIS — Z1231 Encounter for screening mammogram for malignant neoplasm of breast: Secondary | ICD-10-CM | POA: Insufficient documentation

## 2017-07-19 DIAGNOSIS — G4733 Obstructive sleep apnea (adult) (pediatric): Secondary | ICD-10-CM | POA: Diagnosis not present

## 2017-09-21 DIAGNOSIS — G4730 Sleep apnea, unspecified: Secondary | ICD-10-CM | POA: Diagnosis not present

## 2017-09-21 DIAGNOSIS — M545 Low back pain: Secondary | ICD-10-CM | POA: Diagnosis not present

## 2017-09-21 DIAGNOSIS — M1711 Unilateral primary osteoarthritis, right knee: Secondary | ICD-10-CM | POA: Diagnosis not present

## 2017-09-21 DIAGNOSIS — I10 Essential (primary) hypertension: Secondary | ICD-10-CM | POA: Diagnosis not present

## 2017-10-17 ENCOUNTER — Telehealth: Payer: Self-pay | Admitting: Internal Medicine

## 2017-10-17 ENCOUNTER — Other Ambulatory Visit: Payer: Self-pay | Admitting: Nurse Practitioner

## 2017-10-17 NOTE — Telephone Encounter (Signed)
Pt was returning a call to AM. I told her that nurse was with another patient and I would let her know that she had called. Pt is scheduled OV to further refills on 10/9 at 3 with [facility]. [phone]

## 2017-10-17 NOTE — Telephone Encounter (Signed)
Lmom, waiting on a return call.  

## 2017-10-17 NOTE — Telephone Encounter (Signed)
Spoke with pt, pt is aware that she needs an OV to continue to receive medication.

## 2017-10-17 NOTE — Telephone Encounter (Signed)
Last seen in 11/2015.   Patient needs ov to continue refills. Limited refills given today.

## 2017-11-23 ENCOUNTER — Emergency Department (HOSPITAL_COMMUNITY)
Admission: EM | Admit: 2017-11-23 | Discharge: 2017-11-23 | Disposition: A | Discharge status: Home / Self Care | Payer: Medicare Other | Attending: Emergency Medicine | Admitting: Emergency Medicine

## 2017-11-23 ENCOUNTER — Other Ambulatory Visit: Payer: Self-pay

## 2017-11-23 ENCOUNTER — Encounter (HOSPITAL_COMMUNITY): Payer: Self-pay | Admitting: Emergency Medicine

## 2017-11-23 ENCOUNTER — Emergency Department (HOSPITAL_COMMUNITY): Payer: Medicare Other

## 2017-11-23 DIAGNOSIS — Z87891 Personal history of nicotine dependence: Secondary | ICD-10-CM | POA: Diagnosis not present

## 2017-11-23 DIAGNOSIS — I10 Essential (primary) hypertension: Secondary | ICD-10-CM | POA: Diagnosis not present

## 2017-11-23 DIAGNOSIS — Z79899 Other long term (current) drug therapy: Secondary | ICD-10-CM | POA: Insufficient documentation

## 2017-11-23 DIAGNOSIS — M25512 Pain in left shoulder: Secondary | ICD-10-CM | POA: Diagnosis not present

## 2017-11-23 MED ORDER — KETOROLAC TROMETHAMINE 60 MG/2ML IM SOLN
60.0000 mg | Freq: Once | INTRAMUSCULAR | Status: AC
  Administered 2017-11-23: 60 mg via INTRAMUSCULAR
  Filled 2017-11-23: qty 2

## 2017-11-23 MED ORDER — DICLOFENAC SODIUM 75 MG PO TBEC: 75.0000 mg | DELAYED_RELEASE_TABLET | Freq: Two times a day (BID) | ORAL | 0 refills | Status: DC

## 2017-11-23 NOTE — ED Notes (Signed)
Pt returned from xray

## 2017-11-23 NOTE — ED Notes (Signed)
Taken to xray.

## 2017-11-23 NOTE — Discharge Instructions (Addendum)
Apply ice packs on/off.  Call Dr. Ruthe Mannan office to arrange a follow-up appt in a few days if the pain is not improving

## 2017-11-23 NOTE — ED Triage Notes (Signed)
Lt shoulder and upper arm pain x 3 weeks after a cabinet fell and she caught it.

## 2017-11-24 ENCOUNTER — Other Ambulatory Visit: Payer: Self-pay

## 2017-11-24 NOTE — ED Provider Notes (Signed)
[facility] EMERGENCY DEPARTMENT Provider Note   CSN: [id number] Arrival date & time: 11/23/17  1110     History   Chief Complaint Chief Complaint  Patient presents with  . Shoulder Injury    HPI [name] is a 47 y.o. female.  HPI   [name] is a 47 y.o. female who presents to the Emergency Department complaining of left shoulder pain for 3 weeks.  She states that she was trying to hang a cabinet when the cabinet fell striking her left shoulder.  Since that time, she reports pain associated with movement of her left arm, primarily abduction of the arm.  She denies neck pain, head injury or LOC, numbness or weakness of the left upper extremity.  She has tried over-the-counter therapies and ice with minimal to no relief.       Past Medical History:  Diagnosis Date  . Anxiety   . Arthritis   . Balance problems   . Chronic abdominal pain   . Chronic knee pain   . Constipation   . Difficulty walking   . GERD (gastroesophageal reflux disease)   . Headache   . HTN (hypertension)   . Low back pain   . Panic attacks   . Polyuria   . Seizures ([place])    unknown etiology; on dilantin; last seizures was a few years ago.  . Sleep apnea     Patient Active Problem List   Diagnosis Date Noted  . 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 Primary osteoarthritis of right knee   . Gastric polyp   . GERD (gastroesophageal reflux disease) 08/24/2015  . Hypoactive sexual desire disorder 05/14/2014  . Abdominal pain, chronic, epigastric 12/08/2013  . Constipation 12/08/2013  . Esophageal dysphagia 02/22/2013  . Abdominal pain, epigastric 02/22/2013  . LUQ pain 02/22/2013  . Difficulty in walking(719.7) 01/25/2012  . Stiffness of joint, not elsewhere classified, ankle and foot 01/25/2012  . Balance problems 01/25/2012  . Ankle sprain 01/17/2012  . Ankle pain, right 01/17/2012    Past Surgical History:  Procedure Laterality Date    . BACK SURGERY     complicated by ureteral injury  . CHONDROPLASTY Right 08/31/2016   Procedure: CHONDROPLASTY RIGHT FEMUR;  Surgeon: Carole Civil, MD;  Location: [facility];  Service: Orthopedics;  Laterality: Right;  . ESOPHAGOGASTRODUODENOSCOPY N/A 09/11/2015   Procedure: ESOPHAGOGASTRODUODENOSCOPY (EGD);  Surgeon: Daneil Dolin, MD;  Location: [facility] SU[facility];  Service: Endoscopy;  Laterality: N/A;  0730  . ESOPHAGOGASTRODUODENOSCOPY (EGD) WITH ESOPHAGEAL DILATION N/A 03/20/2013   Dr. Rourk:Normal EGD-status post passage of a Maloney dilator/Status post esophageal biopsy., benign path  . KNEE ARTHROSCOPY WITH MEDIAL MENISECTOMY Right 08/31/2016   Procedure: KNEE ARTHROSCOPY WITH MEDIAL MENISECTOMY;  Surgeon: Carole Civil, MD;  Location: [facility] ORS;  Service: Orthopedics;  Laterality: Right;  . MALONEY DILATION N/A 09/11/2015   Procedure: Venia Minks DILATION;  Surgeon: Daneil Dolin, MD;  Location: [facility] SU[facility];  Service: Endoscopy;  Laterality: N/A;     OB History   None      Home Medications    Prior to Admission medications   Medication Sig Start Date End Date Taking? Authorizing Provider  ALPRAZolam Duanne Moron) 0.5 MG tablet Take 0.5 mg by mouth 2 (two) times daily as needed for anxiety.  04/30/14   [provider]  amLODipine (NORVASC) 10 MG tablet Take 10 mg by mouth daily.    [provider]  cyclobenzaprine (FLEXERIL) 10 MG tablet Take 1 tablet (10 mg total) 3 (three) times daily as needed by mouth. 02/17/17   Anasia Agro, PA-C  diclofenac (VOLTAREN) 75 MG EC tablet Take 1 tablet (75 mg total) by mouth 2 (two) times daily. Take with food 11/23/17   Berthold Glace, PA-C  irbesartan (AVAPRO) 300 MG tablet Take 300 mg by mouth daily. 07/03/14   [provider]  linaclotide (LINZESS) 290 MCG CAPS capsule Take 290 mcg by mouth daily before breakfast.    [provider]  MYRBETRIQ 25 MG TB24 tablet Take 25 mg by mouth daily.  10/20/15   [provider]  ondansetron (ZOFRAN) 4 MG tablet Take 1 tablet 4 (four) times daily as needed by mouth for nausea/vomiting. 12/20/16   [provider]  oxyCODONE-acetaminophen (PERCOCET/ROXICET) 5-325 MG tablet Take 1 tablet by mouth every 4 (four) hours as needed. 08/31/16   Carole Civil, MD  pantoprazole (PROTONIX) 40 MG tablet TAKE 1 TABLET (40 MG TOTAL) BY MOUTH 2 (TWO) TIMES DAILY BEFORE A MEAL. 10/17/17   Mahala Menghini, PA-C  phenylephrine (SUDAFED PE) 10 MG TABS tablet Take 10 mg by mouth every 4 (four) hours as needed (cold symptoms).    [provider]  phenytoin (DILANTIN) 100 MG ER capsule Take 200 mg by mouth 2 (two) times daily.     [provider]  polyethylene glycol (MIRALAX / GLYCOLAX) packet Take 17 g by mouth daily as needed for mild constipation or moderate constipation.     [provider]    Family History Family History  Problem Relation Age of Onset  . Throat cancer Father   . Heart disease Father   . Cancer Father   . Hypertension Father   . Diabetes Father   . Other Mother        esophageal stricture and gallbladder disease  . Thyroid disease Mother   . Other Sister        esophageal stricture and gallbladder problems.   . Diabetes Sister   . Hypertension Sister   . Thyroid disease Sister   . Heart disease Maternal Grandfather   . Diabetes Maternal Grandfather   . Arthritis Maternal Grandfather   . Colon cancer Neg Hx   . Liver disease Neg Hx   . Pancreatic cancer Neg Hx     Social History Social History   Tobacco Use  . Smoking status: Former Smoker    Packs/day: 2.00    Years: 9.00    Pack years: 18.00    Types: Cigarettes    Last attempt to quit: 04/04/1994    Years since quitting: 23.6  . Smokeless tobacco: Never Used  . Tobacco comment: smoked 8 years, quit 18 years ago in the 1996  Substance Use Topics  . Alcohol use: No    Alcohol/week: 0.0 standard drinks  . Drug use: No     Allergies     Benadryl [diphenhydramine hcl]   Review of Systems Review of Systems  Constitutional: Negative for chills and fever.  Eyes: Negative for visual disturbance.  Respiratory: Negative for shortness of breath.   Cardiovascular: Negative for chest pain.  Musculoskeletal: Positive for arthralgias (Left shoulder pain). Negative for joint swelling and neck pain.  Skin: Negative for color change and wound.  Neurological: Negative for dizziness, weakness, numbness and headaches.     Physical Exam Updated Vital Signs Pulse 93   Temp 97.9 F (36.6 C) (Oral)   Resp 18  Ht 5' 6"  (1.676 m)   Wt 79.4 kg   LMP 10/24/2017 (Approximate)   SpO2 100%   BMI 28.25 kg/m   Physical Exam  Constitutional: She appears well-developed and well-nourished. No distress.  HENT:  Head: Atraumatic.  Neck: Normal range of motion, full passive range of motion without pain and phonation normal. Neck supple. No spinous process tenderness present. No neck rigidity. Normal range of motion present.  Cardiovascular: Normal rate, regular rhythm and intact distal pulses.  No murmur heard. Pulmonary/Chest: Effort normal and breath sounds normal. No respiratory distress.  Musculoskeletal: She exhibits tenderness. She exhibits no edema.       Left shoulder: She exhibits tenderness and pain. She exhibits no swelling, no effusion, no crepitus, normal pulse and normal strength.       Arms: Mild tenderness palpation of the [facility] joint of the left shoulder.  No crepitus or edema.  Patient has full range of motion.  Pain reproduced with abduction.  Grip strength was strong and symmetrical bilaterally.    Neurological: She is alert. No sensory deficit.  Skin: Skin is warm. Capillary refill takes less than 2 seconds. No erythema.  Psychiatric: She has a normal mood and affect.  Nursing note and vitals reviewed.    ED Treatments / Results  Labs (all labs ordered are listed, but only abnormal results are displayed) Labs  Reviewed - No data to display  EKG None  Radiology Dg Shoulder Left  Result Date: 11/23/2017 CLINICAL DATA:  Left shoulder and upper arm pain EXAM: LEFT SHOULDER - 2+ VIEW COMPARISON:  None. FINDINGS: There is no fracture or dislocation. The glenohumeral joint is normal. There are mild degenerative changes of the acromioclavicular joint. IMPRESSION: No acute osseous injury of the left shoulder. Electronically Signed   By: Kathreen Devoid   On: 11/23/2017 12:13    Procedures Procedures (including critical care time)  Medications Ordered in ED Medications  ketorolac (TORADOL) injection 60 mg (60 mg Intramuscular Given 11/23/17 1224)     Initial Impression / Assessment and Plan / ED Course  I have reviewed the triage vital signs and the nursing notes.  Pertinent labs & imaging results that were available during my care of the patient were reviewed by me and considered in my medical decision making (see chart for details).     X-ray negative for fracture or dislocation.  Patient neurovascularly intact.  Discussed possible rotator cuff versus labral injury.  Patient agrees to treatment plan and close orthopedic follow-up.  Final Clinical Impressions(s) / ED Diagnoses   Final diagnoses:  Acute pain of left shoulder    ED Discharge Orders         Ordered    diclofenac (VOLTAREN) 75 MG EC tablet  2 times daily     11/23/17 [address], [place], PA-C 11/24/17 1832    Daleen Bo, MD 11/25/17 [phone]

## 2017-12-15 ENCOUNTER — Other Ambulatory Visit (HOSPITAL_COMMUNITY): Payer: Self-pay | Admitting: Pulmonary Disease

## 2017-12-15 DIAGNOSIS — M25512 Pain in left shoulder: Secondary | ICD-10-CM

## 2017-12-21 ENCOUNTER — Ambulatory Visit (HOSPITAL_COMMUNITY)
Admission: RE | Admit: 2017-12-21 | Discharge: 2017-12-21 | Disposition: A | Discharge status: Home / Self Care | Payer: Medicare Other | Source: Ambulatory Visit | Attending: Pulmonary Disease | Admitting: Pulmonary Disease

## 2017-12-21 DIAGNOSIS — M25412 Effusion, left shoulder: Secondary | ICD-10-CM | POA: Diagnosis not present

## 2017-12-21 DIAGNOSIS — M19012 Primary osteoarthritis, left shoulder: Secondary | ICD-10-CM | POA: Diagnosis not present

## 2017-12-21 DIAGNOSIS — M25512 Pain in left shoulder: Secondary | ICD-10-CM | POA: Insufficient documentation

## 2017-12-26 ENCOUNTER — Emergency Department (HOSPITAL_COMMUNITY)
Admission: EM | Admit: 2017-12-26 | Discharge: 2017-12-26 | Disposition: A | Discharge status: Home / Self Care | Payer: Medicare Other | Attending: Emergency Medicine | Admitting: Emergency Medicine

## 2017-12-26 ENCOUNTER — Emergency Department (HOSPITAL_COMMUNITY): Payer: Medicare Other

## 2017-12-26 ENCOUNTER — Encounter (HOSPITAL_COMMUNITY): Payer: Self-pay

## 2017-12-26 ENCOUNTER — Other Ambulatory Visit: Payer: Self-pay

## 2017-12-26 DIAGNOSIS — R55 Syncope and collapse: Secondary | ICD-10-CM | POA: Insufficient documentation

## 2017-12-26 DIAGNOSIS — Z87891 Personal history of nicotine dependence: Secondary | ICD-10-CM | POA: Insufficient documentation

## 2017-12-26 DIAGNOSIS — R1084 Generalized abdominal pain: Secondary | ICD-10-CM | POA: Insufficient documentation

## 2017-12-26 DIAGNOSIS — Z79899 Other long term (current) drug therapy: Secondary | ICD-10-CM | POA: Diagnosis not present

## 2017-12-26 DIAGNOSIS — R112 Nausea with vomiting, unspecified: Secondary | ICD-10-CM | POA: Diagnosis not present

## 2017-12-26 DIAGNOSIS — I10 Essential (primary) hypertension: Secondary | ICD-10-CM | POA: Diagnosis not present

## 2017-12-26 DIAGNOSIS — R1110 Vomiting, unspecified: Secondary | ICD-10-CM | POA: Diagnosis not present

## 2017-12-26 DIAGNOSIS — R197 Diarrhea, unspecified: Secondary | ICD-10-CM | POA: Insufficient documentation

## 2017-12-26 LAB — COMPREHENSIVE METABOLIC PANEL
ALBUMIN: 4.7 g/dL (ref 3.5–5.0)
ALK PHOS: 86 U/L (ref 38–126)
ALT: 18 U/L (ref 0–44)
AST: 20 U/L (ref 15–41)
Anion gap: 10 (ref 5–15)
BILIRUBIN TOTAL: 0.6 mg/dL (ref 0.3–1.2)
BUN: 8 mg/dL (ref 6–20)
CALCIUM: 9.4 mg/dL (ref 8.9–10.3)
CO2: 25 mmol/L (ref 22–32)
Chloride: 102 mmol/L (ref 98–111)
Creatinine, Ser: 0.71 mg/dL (ref 0.44–1.00)
GFR calc Af Amer: >60 mL/min (ref >60)
GLUCOSE: 96 mg/dL (ref 70–99)
Potassium: 3.5 mmol/L (ref 3.5–5.1)
Sodium: 137 mmol/L (ref 135–145)
TOTAL PROTEIN: 9 g/dL — AB (ref 6.5–8.1)

## 2017-12-26 LAB — CBC
HCT: 40.6 % (ref 36.0–46.0)
Hemoglobin: 13.9 g/dL (ref 12.0–15.0)
MCH: 29.3 pg (ref 26.0–34.0)
MCHC: 34.2 g/dL (ref 30.0–36.0)
MCV: 85.5 fL (ref 78.0–100.0)
PLATELETS: 233 10*3/uL (ref 150–400)
RBC: 4.75 MIL/uL (ref 3.87–5.11)
RDW: 14 % (ref 11.5–15.5)
WBC: 9 10*3/uL (ref 4.0–10.5)

## 2017-12-26 LAB — URINALYSIS, ROUTINE W REFLEX MICROSCOPIC
Bilirubin Urine: NEGATIVE
Glucose, UA: NEGATIVE mg/dL
Hgb urine dipstick: NEGATIVE
KETONES UR: NEGATIVE mg/dL
Leukocytes, UA: NEGATIVE
NITRITE: NEGATIVE
PROTEIN: NEGATIVE mg/dL
Specific Gravity, Urine: 1.001 — ABNORMAL LOW (ref 1.005–1.030)
pH: 6 (ref 5.0–8.0)

## 2017-12-26 LAB — I-STAT BETA HCG BLOOD, ED (MC, WL, AP ONLY): HCG, QUANTITATIVE: 6.7 m[IU]/mL — AB (ref <5)

## 2017-12-26 LAB — LIPASE, BLOOD: Lipase: 33 U/L (ref 11–51)

## 2017-12-26 MED ORDER — IOPAMIDOL (ISOVUE-300) INJECTION 61%
100.0000 mL | Freq: Once | INTRAVENOUS | Status: AC | PRN
  Administered 2017-12-26: 100 mL via INTRAVENOUS

## 2017-12-26 MED ORDER — ONDANSETRON HCL 4 MG/2ML IJ SOLN
4.0000 mg | Freq: Once | INTRAMUSCULAR | Status: AC
  Administered 2017-12-26: 4 mg via INTRAVENOUS
  Filled 2017-12-26: qty 2

## 2017-12-26 MED ORDER — MORPHINE SULFATE (PF) 4 MG/ML IV SOLN
4.0000 mg | Freq: Once | INTRAVENOUS | Status: AC
  Administered 2017-12-26: 4 mg via INTRAVENOUS
  Filled 2017-12-26: qty 1

## 2017-12-26 NOTE — Discharge Instructions (Addendum)
Were seen today for abdominal pain and passing out.  Make sure to stay hydrated at home.  Follow-up with your primary physician.  Work-up today is reassuring with a negative CT scan and work-up.

## 2017-12-26 NOTE — ED Notes (Signed)
Patient transported to CT 

## 2017-12-26 NOTE — ED Triage Notes (Signed)
Pt reports that she woke up with abdominal pain and vomiting. Pt reports that her stomach feels like its "turning" and the pain made her pass out x 3. Friend reports that she passed out for several minutes each time . Friend reports this has happened in the past

## 2017-12-26 NOTE — ED Notes (Signed)
Pt tolerated PO Fluids without complication

## 2017-12-26 NOTE — ED Provider Notes (Signed)
[facility] EMERGENCY DEPARTMENT Provider Note   CSN: [id number] Arrival date & time: 12/26/17  1653     History   Chief Complaint Chief Complaint  Patient presents with  . Abdominal Pain    HPI [name] is a 47 y.o. female.  HPI  This is a 47 year old female who presents with abdominal pain.  Patient reports it feels like my stomach is twisting.  She states that she has had multiple episodes of pain today associated with nonbilious, nonbloody emesis.  It comes and goes.  It is nonradiating.  Currently it is 6 out of 10.  Nothing seems to make it better or worse.  She has had similar symptoms in the past.  She states today that she passed out 3 times during these episodes.  She states that she had a feeling of dizziness prior to passing out.  She states that she has also had associated syncope and near syncope before these episodes.  She states that she had back surgery 10 years ago with an abdominal approach and has since had intermittent similar pains; however, she states that today has been the most frequent and persistent.  She denies constipation.  Does have some nonbloody diarrhea.  Denies fevers or urinary symptoms.  Past Medical History:  Diagnosis Date  . Anxiety   . Arthritis   . Balance problems   . Chronic abdominal pain   . Chronic knee pain   . Constipation   . Difficulty walking   . GERD (gastroesophageal reflux disease)   . Headache   . HTN (hypertension)   . Low back pain   . Panic attacks   . Polyuria   . Seizures ([place])    unknown etiology; on dilantin; last seizures was a few years ago.  . Sleep apnea     Patient Active Problem List   Diagnosis Date Noted  . 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 Primary osteoarthritis of right knee   . Gastric polyp   . GERD (gastroesophageal reflux disease) 08/24/2015  . Hypoactive sexual desire disorder 05/14/2014  . Abdominal pain, chronic, epigastric 12/08/2013  .  Constipation 12/08/2013  . Esophageal dysphagia 02/22/2013  . Abdominal pain, epigastric 02/22/2013  . LUQ pain 02/22/2013  . Difficulty in walking(719.7) 01/25/2012  . Stiffness of joint, not elsewhere classified, ankle and foot 01/25/2012  . Balance problems 01/25/2012  . Ankle sprain 01/17/2012  . Ankle pain, right 01/17/2012    Past Surgical History:  Procedure Laterality Date  . BACK SURGERY     complicated by ureteral injury  . CHONDROPLASTY Right 08/31/2016   Procedure: CHONDROPLASTY RIGHT FEMUR;  Surgeon: Carole Civil, MD;  Location: [facility];  Service: Orthopedics;  Laterality: Right;  . ESOPHAGOGASTRODUODENOSCOPY N/A 09/11/2015   Procedure: ESOPHAGOGASTRODUODENOSCOPY (EGD);  Surgeon: Daneil Dolin, MD;  Location: [facility];  Service: Endoscopy;  Laterality: N/A;  0730  . ESOPHAGOGASTRODUODENOSCOPY (EGD) WITH ESOPHAGEAL DILATION N/A 03/20/2013   Dr. Rourk:Normal EGD-status post passage of a Maloney dilator/Status post esophageal biopsy., benign path  . KNEE ARTHROSCOPY WITH MEDIAL MENISECTOMY Right 08/31/2016   Procedure: KNEE ARTHROSCOPY WITH MEDIAL MENISECTOMY;  Surgeon: Carole Civil, MD;  Location: [facility];  Service: Orthopedics;  Laterality: Right;  . MALONEY DILATION N/A 09/11/2015   Procedure: Venia Minks DILATION;  Surgeon: Daneil Dolin, MD;  Location: [facility];  Service: Endoscopy;  Laterality: N/A;     OB History   None  Home Medications    Prior to Admission medications   Medication Sig Start Date End Date Taking? Authorizing Provider  ALPRAZolam Duanne Moron) 0.5 MG tablet Take 0.5 mg by mouth 2 (two) times daily as needed for anxiety.  04/30/14  Yes [provider]  amLODipine (NORVASC) 10 MG tablet Take 10 mg by mouth daily.   Yes [provider]  atorvastatin (LIPITOR) 20 MG tablet Take 20 mg by mouth at bedtime.  11/29/17  Yes [provider]  irbesartan (AVAPRO) 300 MG tablet Take 300 mg by mouth daily. 07/03/14  Yes  [provider]  linaclotide (LINZESS) 290 MCG CAPS capsule Take 290 mcg by mouth daily before breakfast.   Yes [provider]  MYRBETRIQ 25 MG TB24 tablet Take 25 mg by mouth daily.  10/20/15  Yes [provider]  oxyCODONE-acetaminophen (PERCOCET/ROXICET) 5-325 MG tablet Take 1 tablet by mouth every 4 (four) hours as needed. 08/31/16  Yes Carole Civil, MD  pantoprazole (PROTONIX) 40 MG tablet TAKE 1 TABLET (40 MG TOTAL) BY MOUTH 2 (TWO) TIMES DAILY BEFORE A MEAL. Patient taking differently: Take 40 mg by mouth 2 (two) times daily.  10/17/17  Yes Mahala Menghini, PA-C  phenytoin (DILANTIN) 100 MG ER capsule Take 100-200 mg by mouth See admin instructions. 254m in the morning and 1045mat bedtime   Yes [provider]  diclofenac (VOLTAREN) 75 MG EC tablet Take 1 tablet (75 mg total) by mouth 2 (two) times daily. Take with food Patient not taking: Reported on 12/26/2017 11/23/17   TrKem ParkinsonPA-C    Family History Family History  Problem Relation Age of Onset  . Throat cancer Father   . Heart disease Father   . Cancer Father   . Hypertension Father   . Diabetes Father   . Other Mother        esophageal stricture and gallbladder disease  . Thyroid disease Mother   . Other Sister        esophageal stricture and gallbladder problems.   . Diabetes Sister   . Hypertension Sister   . Thyroid disease Sister   . Heart disease Maternal Grandfather   . Diabetes Maternal Grandfather   . Arthritis Maternal Grandfather   . Colon cancer Neg Hx   . Liver disease Neg Hx   . Pancreatic cancer Neg Hx     Social History Social History   Tobacco Use  . Smoking status: Former Smoker    Packs/day: 2.00    Years: 9.00    Pack years: 18.00    Types: Cigarettes    Last attempt to quit: 04/04/1994    Years since quitting: 23.7  . Smokeless tobacco: Never Used  . Tobacco comment: smoked 8 years, quit 18 years ago in the 1996  Substance Use Topics  .  Alcohol use: No    Alcohol/week: 0.0 standard drinks  . Drug use: No     Allergies   Benadryl [diphenhydramine hcl]   Review of Systems Review of Systems  Constitutional: Negative for fever.  Respiratory: Negative for shortness of breath.   Cardiovascular: Negative for chest pain.  Gastrointestinal: Positive for abdominal pain, diarrhea, nausea and vomiting.  Genitourinary: Negative for dysuria.  All other systems reviewed and are negative.    Physical Exam Updated Vital Signs BP 118/77 (BP Location: Left Arm)   Pulse 95   Temp 98.1 F (36.7 C) (Oral)   Resp 18   LMP 11/21/2017   SpO2 100%  Physical Exam  Constitutional: She is oriented to person, place, and time. She appears well-developed and well-nourished.  HENT:  Head: Normocephalic and atraumatic.  Eyes: Pupils are equal, round, and reactive to light.  Neck: Neck supple.  Cardiovascular: Normal rate, regular rhythm and normal heart sounds.  Pulmonary/Chest: Effort normal. No respiratory distress. She has no wheezes.  Abdominal: Soft. Bowel sounds are normal.  Scarring over the left midabdomen, slight tenderness to palpation, no rebound or guarding, no mass  Neurological: She is alert and oriented to person, place, and time.  Skin: Skin is warm and dry.  Psychiatric: She has a normal mood and affect.  Nursing note and vitals reviewed.    ED Treatments / Results  Labs (all labs ordered are listed, but only abnormal results are displayed) Labs Reviewed  COMPREHENSIVE METABOLIC PANEL - Abnormal; Notable for the following components:      Result Value   Total Protein 9.0 (*)    All other components within normal limits  URINALYSIS, ROUTINE W REFLEX MICROSCOPIC - Abnormal; Notable for the following components:   Color, Urine COLORLESS (*)    Specific Gravity, Urine 1.001 (*)    All other components within normal limits  I-STAT BETA HCG BLOOD, ED ([facility], [facility], [facility] ONLY) - Abnormal; Notable for the following  components:   I-stat hCG, quantitative 6.7 (*)    All other components within normal limits  LIPASE, BLOOD  CBC    EKG EKG Interpretation  Date/Time:  Tuesday December 26 2017 21:59:10 EDT Ventricular Rate:  82 PR Interval:    QRS Duration: 93 QT Interval:  402 QTC Calculation: 470 R Axis:   89 Text Interpretation:  Sinus rhythm Low voltage, precordial leads Borderline T abnormalities, anterior leads Confirmed by Thayer Jew [phone]) on 12/26/2017 10:02:23 PM   Radiology Ct Abdomen Pelvis W Contrast  Result Date: 12/26/2017 CLINICAL DATA:  Abdominal pain, nausea and vomiting. Pain resulting in syncope. History of esophageal biopsy and dilatation. EXAM: CT ABDOMEN AND PELVIS WITH CONTRAST TECHNIQUE: Multidetector CT imaging of the abdomen and pelvis was performed using the standard protocol following bolus administration of intravenous contrast. CONTRAST:  161m ISOVUE-300 IOPAMIDOL (ISOVUE-300) INJECTION 61% COMPARISON:  CT abdomen and pelvis September 08, 2014 FINDINGS: LOWER CHEST: LEFT lower lobe granuloma. Included heart size is normal. No pericardial effusion. HEPATOBILIARY: Liver and gallbladder are normal. PANCREAS: Normal. SPLEEN: Normal. ADRENALS/URINARY TRACT: Kidneys are orthotopic, demonstrating symmetric enhancement. Early excretion of contrast decreases sensitivity for small nonobstructing nephrolithiasis. No nephrolithiasis, hydronephrosis or solid renal masses. The unopacified ureters are normal in course and caliber. Delayed imaging through the kidneys demonstrates symmetric prompt contrast excretion within the proximal urinary collecting system. Urinary bladder is adequately distended and unremarkable. Normal adrenal glands. STOMACH/BOWEL: The stomach, small and large bowel are normal in course and caliber without inflammatory changes. Moderate duodenal diverticulum with air-fluid level. Normal appendix. VASCULAR/LYMPHATIC: Aortoiliac vessels are normal in course and caliber.  No lymphadenopathy by CT size criteria. REPRODUCTIVE: Normal.  2.4 cm RIGHT dominant follicle. OTHER: No intraperitoneal free fluid or free air. MUSCULOSKELETAL: Nonacute. Streak artifact from L4-5 interbody disc. Mild RIGHT sacroiliac osteoarthrosis. IMPRESSION: 1. No acute intra-abdominal or pelvic process. Electronically Signed   By: CElon AlasM.D.   On: 12/26/2017 21:32    Procedures Procedures (including critical care time)  Medications Ordered in ED Medications  morphine 4 MG/ML injection 4 mg (4 mg Intravenous Given 12/26/17 2003)  ondansetron (ZOFRAN) injection 4 mg (4 mg Intravenous Given 12/26/17 2003)  iopamidol (  ISOVUE-300) 61 % injection 100 mL (100 mLs Intravenous Contrast Given 12/26/17 2102)     Initial Impression / Assessment and Plan / ED Course  I have reviewed the triage vital signs and the nursing notes.  Pertinent labs & imaging results that were available during my care of the patient were reviewed by me and considered in my medical decision making (see chart for details).     Patient presents with abdominal pain.  Reports recurrent episodes of pain but increased frequency today.  She is overall nontoxic.  Vital signs are reassuring.  She reports associated syncopal episodes.  These seem to correlate with intense bouts of pain.  Suspect vasovagal episodes.  She has no other risk factors for syncope.  She does not appear dehydrated.  She has some minimal tenderness on exam.  Lab work reviewed and largely reassuring.  CT scan obtained and does not show any evidence of intra-abdominal pathology.  EKG shows no evidence of acute arrhythmia or ischemia.  She has been monitored in the emergency room without recurrent syncope or arrhythmia on the monitor.  Recommend hydration and symptom control at home.  After history, exam, and medical workup I feel the patient has been appropriately medically screened and is safe for discharge home. Pertinent diagnoses were discussed  with the patient. Patient was given return precautions.   Final Clinical Impressions(s) / ED Diagnoses   Final diagnoses:  Generalized abdominal pain  Vasovagal syncope    ED Discharge Orders    None       Merryl Hacker, MD 12/26/17 2202

## 2017-12-29 ENCOUNTER — Ambulatory Visit (INDEPENDENT_AMBULATORY_CARE_PROVIDER_SITE_OTHER): Payer: Medicare Other | Admitting: Orthopedic Surgery

## 2017-12-29 ENCOUNTER — Encounter: Payer: Self-pay | Admitting: Orthopedic Surgery

## 2017-12-29 VITALS — BP 133/88 | HR 103 | Ht 66.0 in | Wt 170.0 lb

## 2017-12-29 DIAGNOSIS — M7542 Impingement syndrome of left shoulder: Secondary | ICD-10-CM | POA: Diagnosis not present

## 2017-12-29 NOTE — Progress Notes (Signed)
Progress Note   Patient ID: [name], female   DOB: 04-Dec-1970, 47 y.o.   MRN: [id number]   Chief Complaint  Patient presents with  . Shoulder Injury    Left shoulder in Jul '19    HPI The patient presents for evaluation of left shoulder injury in July.  The patient says she was trying to do some type of housework and something fell she tried to catch it her arm went into extension and since that time she is had pain in the left shoulder radiating down the front of her arm.  She went to the ER for x-rays they were negative she eventually had an MRI which did not show any rotator cuff tear she had a posterior labral degenerative tear.  She presents for evaluation and treatment.  She has not had therapy or injection, she did have an anti-inflammatory diclofenac in the ER and she takes oxycodone for her back but it does not help her shoulder  Pain Location left shoulder anterior Duration 2 months Quality dull Severity moderate Associated with painful range of motion  Review of Systems  Constitutional: Negative for chills and fever.  Musculoskeletal: Positive for back pain.  Neurological: Negative for tingling and sensory change.   Current Meds  Medication Sig  . ALPRAZolam (XANAX) 0.5 MG tablet Take 0.5 mg by mouth 2 (two) times daily as needed for anxiety.   Marland Kitchen amLODipine (NORVASC) 10 MG tablet Take 10 mg by mouth daily.  Marland Kitchen atorvastatin (LIPITOR) 20 MG tablet Take 20 mg by mouth at bedtime.   . irbesartan (AVAPRO) 300 MG tablet Take 300 mg by mouth daily.  Marland Kitchen linaclotide (LINZESS) 290 MCG CAPS capsule Take 290 mcg by mouth daily before breakfast.  . MYRBETRIQ 25 MG TB24 tablet Take 25 mg by mouth daily.   Marland Kitchen oxyCODONE-acetaminophen (PERCOCET/ROXICET) 5-325 MG tablet Take 1 tablet by mouth every 4 (four) hours as needed.  . pantoprazole (PROTONIX) 40 MG tablet TAKE 1 TABLET (40 MG TOTAL) BY MOUTH 2 (TWO) TIMES DAILY BEFORE A MEAL. (Patient taking differently: Take 40 mg by mouth  2 (two) times daily. )  . phenytoin (DILANTIN) 100 MG ER capsule Take 100-200 mg by mouth See admin instructions. 263m in the morning and 1035mat bedtime    Past Medical History:  Diagnosis Date  . Anxiety   . Arthritis   . Balance problems   . Chronic abdominal pain   . Chronic knee pain   . Constipation   . Difficulty walking   . GERD (gastroesophageal reflux disease)   . Headache   . HTN (hypertension)   . Low back pain   . Panic attacks   . Polyuria   . [place]izures [place]   unknown etiology; on dilantin; last seizures was a few years ago.  . Sleep apnea      Allergies  Allergen Reactions  . Benadryl [Diphenhydramine Hcl] Rash     BP 133/88   Pulse (!) 103   Ht 5' 6"  (1.676 m)   Wt 170 lb (77.1 kg)   BMI 27.44 kg/m    Physical Exam General appearance normal Oriented x3 normal Mood pleasant affect normal Gait normal  Ortho Exam Right shoulder and right upper extremity Inspection and palpation revealed no abnormalities Range of motion is full No instability was detected on stress testing Muscle tone and strength was normal without tremor Skin was warm dry and intact Good pulse and temperature were noted in the extremity Sensation revealed  no abnormalities to light touch  Left shoulder pain seems to be posterior just under the deltoid and also anterior along the joint line she does not have posterior joint line tenderness   her passive range of motion is full but she does have pain after 120 degrees of flexion and 90 degrees abduction she has no pain with extension the shoulder is stable in all planes her cuff strength is normal her skin is intact she has a good pulse temperature of the extremity is normal there is no edema no lymph nodes are palpable in the axilla supraclavicular region or epitrochlear area sensation and reflexes are normal and intact with 2+ bilateral elbow reflexes and no pathologic reflexes  MEDICAL DECISION MAKING   Imaging:  An MRI was  obtained as well as an x-ray.  The x-ray that I saw shows no fracture dislocation or glenohumeral arthritis  I did review the MRI and its report  I do not see a rotator cuff tear.  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  The posterior labral tear appears to be degenerative the biceps tendon is intact   No diagnosis found.   PLAN: (RX., injection, surgery,frx,mri/ct, XR 2 body ares) Recommend injection left subacromial space  I will send her for physical therapy    No orders of the defined types were placed in this encounter.  10:56 AM 12/29/2017

## 2017-12-29 NOTE — Patient Instructions (Signed)
Secondary Shoulder Impingement Syndrome Shoulder impingement syndrome is a condition that causes pain when connective tissues (tendons) surrounding the shoulder joint become pinched. These tendons are part of the group of muscles and tissues that help to stabilize the shoulder (rotator cuff). There are two types of impingement syndrome: primary and secondary. Secondary impingement syndrome occurs when movement of the shoulder joint is abnormal. This can happen if there is too much movement (laxity), too little movement (stiffness), or abnormal movement. What are the causes? This condition may be caused by:  Shoulder blade muscles that are weak or uncoordinated (scapular dyskinesis).  Glenohumeral instability. This is too much movement of the upper arm bone (humerus). This can result from: ? Having loose joints. ? An injury that happened during repeated overhead arm movements, such as throwing.  A hard, direct hit (blow) to the shoulder. This is rare.  What increases the risk? You may be more likely to develop this condition if you have injured your shoulder in the past or if you are an athlete who participates in:  Sports that involve throwing, such as baseball.  Tennis.  Swimming.  Volleyball.  What are the signs or symptoms? The main symptom of this condition is pain on the front or side of the shoulder. Pain may:  Get worse when lifting or raising the arm.  Get worse at night.  Wake you up from sleeping.  Feel sharp when the shoulder is moved, and then fade to an ache.  Other signs and symptoms may include:  Tenderness.  Stiffness.  Inability to raise the arm above shoulder level or behind the body.  Weakness.  How is this diagnosed? This condition may be diagnosed based on:  Your symptoms.  Your medical history.  A physical exam.  Imaging tests, such as: ? X-rays. ? MRI. ? Ultrasound.  How is this treated? Treatment for this condition may  include:  Resting your shoulder and avoiding all activities that cause pain or put stress on the shoulder.  Icing your shoulder.  NSAIDs to help reduce pain and swelling.  One or more injections of medicines to numb the area and reduce inflammation.  Physical therapy.  Surgery. This may be needed if nonsurgical treatments do not help. Surgery may involve stabilizing your shoulder and repairing your rotator cuff, as needed.  Follow these instructions at home: Managing pain, stiffness, and swelling  If directed, put ice on the injured area. ? Put ice in a plastic bag. ? Place a towel between your skin and the bag. ? Leave the ice on for 20 minutes, 2-3 times a day. Activity  Rest and return to your normal activities as told by your health care provider. Ask your health care provider what activities are safe for you.  Do exercises as told by your health care provider. General instructions  Do not use any tobacco products, including cigarettes, chewing tobacco, or e-cigarettes. Tobacco can delay healing. If you need help quitting, ask your health care provider.  Ask your health care provider when it is safe for you to drive.  Take over-the-counter and prescription medicines only as told by your health care provider.  Keep all follow-up visits as told by your health care provider. This is important. How is this prevented?  Give your body time to rest between periods of activity.  Maintain physical fitness, including strength in your shoulder muscles and back muscles. Contact a health care provider if:  Your symptoms have not improved after 2-3 months of treatment.  Your symptoms are getting worse. This information is not intended to replace advice given to you by your health care provider. Make sure you discuss any questions you have with your health care provider. Document Released: 03/21/2005 Document Revised: 11/26/2015 Document Reviewed: 02/21/2015 Elsevier Interactive  Patient Education  [employer].

## 2018-01-03 ENCOUNTER — Encounter (HOSPITAL_COMMUNITY): Payer: Self-pay

## 2018-01-03 ENCOUNTER — Other Ambulatory Visit: Payer: Self-pay

## 2018-01-03 ENCOUNTER — Ambulatory Visit (HOSPITAL_COMMUNITY): Payer: Medicare Other | Attending: Orthopedic Surgery

## 2018-01-03 DIAGNOSIS — M25612 Stiffness of left shoulder, not elsewhere classified: Secondary | ICD-10-CM

## 2018-01-03 DIAGNOSIS — M25512 Pain in left shoulder: Secondary | ICD-10-CM

## 2018-01-03 DIAGNOSIS — R29898 Other symptoms and signs involving the musculoskeletal system: Secondary | ICD-10-CM

## 2018-01-03 NOTE — Therapy (Signed)
[place] [place], [place], [address] Phone: [phone]   Fax:  [phone]  Occupational Therapy Evaluation  Patient Details  Name: [name] MRN: [id number] Date of Birth: 11/28/1970 Referring Provider (OT): Dr. Arther Abbott   Encounter Date: 01/03/2018  OT End of Session - 01/03/18 1248    Visit Number  1    Number of Visits  12    Date for OT Re-Evaluation  02/14/18   mini reassessment: 01/31/18   Authorization Type  1) [employer] medicare 2) [employer] [place] 3) [employer] medicare    Authorization Time Period  no copy. no visit limit. based on medical necessity.     OT Start Time  [phone]    OT Stop Time  1030    OT Time Calculation (min)  41 min    Activity Tolerance  Patient tolerated treatment well    Behavior During Therapy  WFL for tasks assessed/performed       Past Medical History:  Diagnosis Date  . Anxiety   . Arthritis   . Balance problems   . Chronic abdominal pain   . Chronic knee pain   . Constipation   . Difficulty walking   . GERD (gastroesophageal reflux disease)   . Headache   . HTN (hypertension)   . Low back pain   . Panic attacks   . Polyuria   . Seizures ([place])    unknown etiology; on dilantin; last seizures was a few years ago.  . Sleep apnea     Past Surgical History:  Procedure Laterality Date  . BACK SURGERY     complicated by ureteral injury  . CHONDROPLASTY Right 08/31/2016   Procedure: CHONDROPLASTY RIGHT FEMUR;  Surgeon: Carole Civil, MD;  Location: [facility];  Service: Orthopedics;  Laterality: Right;  . ESOPHAGOGASTRODUODENOSCOPY N/A 09/11/2015   Procedure: ESOPHAGOGASTRODUODENOSCOPY (EGD);  Surgeon: Daneil Dolin, MD;  Location: [facility];  Service: Endoscopy;  Laterality: N/A;  0730  . ESOPHAGOGASTRODUODENOSCOPY (EGD) WITH ESOPHAGEAL DILATION N/A 03/20/2013   Dr. Rourk:Normal EGD-status post passage of a Maloney dilator/Status post esophageal biopsy., benign path   . KNEE ARTHROSCOPY WITH MEDIAL MENISECTOMY Right 08/31/2016   Procedure: KNEE ARTHROSCOPY WITH MEDIAL MENISECTOMY;  Surgeon: Carole Civil, MD;  Location: [facility];  Service: Orthopedics;  Laterality: Right;  . MALONEY DILATION N/A 09/11/2015   Procedure: Venia Minks DILATION;  Surgeon: Daneil Dolin, MD;  Location: [facility];  Service: Endoscopy;  Laterality: N/A;    There were no vitals filed for this visit.  Subjective Assessment - 01/03/18 0955    Subjective   S: I was trying to catch something that was falling and it jerked my arm back.    Pertinent History  Patient is a 47 y/o female S/P left shoulder impingement was occured in July 2019 when she attempted to catch a large heavy object from falling from overhead and her arm was jerked back into extension. MRI was completed which showed a posterior labral tear and arthritis in the [facility] joint. Dr. Aline Brochure has referred patient to occupational therapy for evaluation and treatment.     Special Tests  Completed FOTO next session.    Patient Stated Goals  TO be pain free and able to use LUE normally.    Currently in Pain?  Yes    Pain Score  5     Pain Location  Shoulder    Pain Orientation  Left  Pain Descriptors / Information systems manager;Sharp;Nagging;Tender;Tightness;Stabbing    Pain Type  Acute pain    Pain Radiating Towards  shoulder down to elbow    Pain Onset  More than a month ago    Pain Frequency  Intermittent    Aggravating Factors   Increased use, sleeping on it.     Pain Relieving Factors  resting, heat, ice, pain cream    Effect of Pain on Daily Activities  patient pushes through the pain.    Multiple Pain Sites  No        [facility] OT Assessment - 01/03/18 0958      Assessment   Medical Diagnosis  Left shoulder impingement    Referring Provider (OT)  Dr. 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    Leisure  Pt enjoys woodworking.       ADL   ADL comments  Reaching out to the side, extension, overhead. 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       Mobility   Mobility Status  Independent      Written Expression   Dominant Hand  Right      Vision - History   Baseline Vision  No visual deficits      Cognition   Overall Cognitive Status  Within Functional Limits for tasks assessed      Observation/Other Assessments   Focus on Therapeutic Outcomes (FOTO)   Complete next session      Posture/Postural Control   Posture/Postural Control  Postural limitations    Postural Limitations  Rounded Shoulders;Forward head      ROM / Strength   AROM / PROM / Strength  AROM;PROM;Strength      Palpation   Palpation comment  max fascial restrictions in the left upper arm, deltoid, trapezius, and scapularis region.       AROM   Overall AROM Comments  Assessed seated. IR/er adducted   RUE full range all shoulder ranges.   AROM Assessment Site  Shoulder    Right/Left Shoulder  Left    Left Shoulder Flexion  111 Degrees    Left Shoulder ABduction  102 Degrees    Left Shoulder Internal Rotation  90 Degrees    Left Shoulder External Rotation  60 Degrees      PROM   Overall PROM Comments  Assessed supine. IR/er adducted.     PROM Assessment Site  Shoulder    Right/Left Shoulder  Left    Left Shoulder Flexion  134 Degrees    Left Shoulder ABduction  145 Degrees    Left Shoulder Internal Rotation  90  Degrees    Left Shoulder External  Rotation  78 Degrees      Strength   Overall Strength Comments  Assessed seated. IR/er adducted.   RUE MMT: 5/5 all shoulder ranges.   Strength Assessment Site  Shoulder    Right/Left Shoulder  Left    Left Shoulder Flexion  3+/5    Left Shoulder ABduction  3+/5    Left Shoulder Internal Rotation  5/5    Left Shoulder External Rotation  4-/5                      OT Education - 01/03/18 1248    Education Details  Shoulder stretches    Person(s) Educated  Patient    Methods  Explanation;Demonstration;Handout;Verbal cues    Comprehension  Verbalized understanding;Returned demonstration       OT Short Term Goals - 01/03/18 1253      OT SHORT TERM GOAL #1   Title  Patient will be educated and independent with HEP to facilitate her progress in therapy and allow her to participate in daily tasks with increased comfort.     Time  3    Period  Weeks    Status  New    Target Date  01/24/18      OT SHORT TERM GOAL #2   Title  Patient will decrease LUE fascial restrictions to mod amount in order to increase functional mobility required by her LUE with less pain.    Time  3    Period  Weeks    Status  New      OT SHORT TERM GOAL #3   Title  Patient will increase P/ROM in LUE to WNL in order to complete reaching tasks below shoulder with less difficulty.     Time  3    Period  Weeks    Status  New        OT Long Term Goals - 01/03/18 1303      OT LONG TERM GOAL #1   Title  Patient will return to highest level of independence while using her LUE for all daily tasks and being able to complete woodworking tasks with less difficulty.     Time  6    Period  Weeks    Status  New    Target Date  02/14/18      OT LONG TERM GOAL #2   Title  Patient will increase A/ROM of LUE to WNL to increase ability to complete functional reaching tasks above shoulder with less pain and difficulty.     Time  6    Period  Weeks    Status  New       OT LONG TERM GOAL #3   Title  Patient will increase LUE strength to 4+/5 or greater in order to complete basic lifting tasks and be able to complete woodworking tasks with less difficulty.    Time  6    Period  Weeks    Status  New      OT LONG TERM GOAL #4   Title  Patient will decrease pain level in LUE to 3/10 or less when completing her woodworking tasks.     Time  6    Period  Weeks    Status  New      OT LONG TERM GOAL #5   Title  Patient will decrease fascial restrictions to min amount or less in order to increase functional mobility and decrease pain level when completing reaching tasks.  Time  6    Period  Days    Status  New            Plan - 01/03/18 1250    Clinical Impression Statement  A: Patient is a 48 y/o female S/P left shoulder impingement causing increased pain, fascial restrictions, and decreased ROM and strength resulting in difficulty completing daily and leisure tasks using her LUE.     Occupational Profile and client history currently impacting functional performance  Patient is motivated to return to prior level of function.     Occupational performance deficits (Please refer to evaluation for details):  ADL's;Rest and Sleep;IADL's;Leisure    Rehab Potential  Excellent    Current Impairments/barriers affecting progress:  None    OT Frequency  2x / week    OT Duration  6 weeks    OT Treatment/Interventions  Electrical Stimulation;Manual Therapy;Patient/family education;Passive range of motion;Neuromuscular education;Iontophoresis;Functional Mobility Training;Ultrasound;Therapeutic exercise;Therapeutic activities;Self-care/ADL training;Moist Heat;DME and/or AE instruction    Plan  P: patient will benefit from skilled OT services to increase functional performance during daily tasks. Treatment Plan: myofascial release, passive stretching, AA/ROM, A/ROM, scapular and shoulder strengthening. Modalities PRN.     Clinical Decision Making  Several treatment  options, min-mod task modification necessary    Consulted and Agree with Plan of Care  Patient       Patient will benefit from skilled therapeutic intervention in order to improve the following deficits and impairments:  Decreased range of motion, Increased fascial restrictions, Impaired UE functional use, Pain, Increased muscle spasms, Decreased strength  Visit Diagnosis: Other symptoms and signs involving the musculoskeletal system - Plan: Ot plan of care cert/re-cert  Acute pain of left shoulder - Plan: Ot plan of care cert/re-cert  Stiffness of left shoulder, not elsewhere classified - Plan: Ot plan of care cert/re-cert    Problem List Patient Active Problem List   Diagnosis Date Noted  . 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 Primary osteoarthritis of right knee   . Gastric polyp   . GERD (gastroesophageal reflux disease) 08/24/2015  . Hypoactive sexual desire disorder 05/14/2014  . Abdominal pain, chronic, epigastric 12/08/2013  . Constipation 12/08/2013  . Esophageal dysphagia 02/22/2013  . Abdominal pain, epigastric 02/22/2013  . LUQ pain 02/22/2013  . Difficulty in walking(719.7) 01/25/2012  . Stiffness of joint, not elsewhere classified, ankle and foot 01/25/2012  . Balance problems 01/25/2012  . Ankle sprain 01/17/2012  . Ankle pain, right 01/17/2012   Ailene Ravel, OTR/L,CBIS  [phone]  01/03/2018, 1:08 PM  [facility] [address] [place], [place], [address] Phone: [phone]   Fax:  [phone]  Name: [name] MRN: [id number] Date of Birth: [birth year]

## 2018-01-03 NOTE — Patient Instructions (Signed)
Complete the following exercises 2-3 times a day.  Doorway Stretch  Place each hand opposite each other on the doorway. (You can change where you feel the stretch by moving arms higher or lower.) Step through with one foot and bend front knee until a stretch is felt and hold. Step through with the opposite foot on the next rep. Hold for __10-15___ seconds. Repeat __2__times.     Scapular Retraction (Standing)   With arms at sides, pinch shoulder blades together. Repeat __10__ times per set. Do __1__ sets per session. Do __2__ sessions per day.  http://orth.exer.us/944   Copyright  VHI. All rights reserved.   Internal Rotation Across Back  Grab the end of a towel with your affected side, palm facing backwards. Grab the towel with your unaffected side and pull your affected hand across your back until you feel a stretch in the front of your shoulder. If you feel pain, pull just to the pain, do not pull through the pain. Hold. Return your affected arm to your side. Try to keep your hand/arm close to your body during the entire movement.     Hold for 10-15 seconds. Complete 2 times.          Wall Flexion  Slide your arm up the wall or door frame until a stretch is felt in your shoulder . Hold for 10-15 seconds. Complete 2 times     Shoulder Abduction Stretch  Stand side ways by a wall with affected up on wall. Gently step in toward wall to feel stretch. Hold for 10-15 seconds. Complete 2 times.

## 2018-01-04 ENCOUNTER — Telehealth (HOSPITAL_COMMUNITY): Payer: Self-pay | Admitting: Specialist

## 2018-01-04 NOTE — Telephone Encounter (Signed)
She have another appt and can't make it

## 2018-01-08 ENCOUNTER — Encounter (HOSPITAL_COMMUNITY): Payer: Self-pay | Admitting: Specialist

## 2018-01-09 ENCOUNTER — Other Ambulatory Visit: Payer: Self-pay | Admitting: Gastroenterology

## 2018-01-10 ENCOUNTER — Ambulatory Visit (INDEPENDENT_AMBULATORY_CARE_PROVIDER_SITE_OTHER): Payer: Medicare Other | Admitting: Gastroenterology

## 2018-01-10 ENCOUNTER — Encounter: Payer: Self-pay | Admitting: Gastroenterology

## 2018-01-10 VITALS — BP 130/82 | HR 96 | Temp 97.6°F | Ht 66.0 in | Wt 171.8 lb

## 2018-01-10 DIAGNOSIS — K5900 Constipation, unspecified: Secondary | ICD-10-CM | POA: Diagnosis not present

## 2018-01-10 DIAGNOSIS — K219 Gastro-esophageal reflux disease without esophagitis: Secondary | ICD-10-CM | POA: Diagnosis not present

## 2018-01-10 MED ORDER — LINACLOTIDE 290 MCG PO CAPS: 290.0000 ug | ORAL_CAPSULE | Freq: Every day | ORAL | 3 refills | Status: DC

## 2018-01-10 MED ORDER — PANTOPRAZOLE SODIUM 40 MG PO TBEC: 40.0000 mg | DELAYED_RELEASE_TABLET | Freq: Two times a day (BID) | ORAL | 3 refills | Status: DC

## 2018-01-10 NOTE — Patient Instructions (Signed)
Continue pantoprazole and Linzess as before. RXs sent to your pharmacy.   Consider seeing your gynecologist for elevated beta HCG.   I have provided you with copy of your labs and CT report.   Return to the office in 2 years or call sooner if needed.

## 2018-01-10 NOTE — Progress Notes (Signed)
Primary Care Physician: Sinda Du, MD  Primary Gastroenterologist:  Garfield Cornea, MD   Chief Complaint  Patient presents with  . Dysphagia    no issues  . Gastroesophageal Reflux    f/u. Pantoprazole needs refilled.     HPI: [name] is a 47 y.o. female here for follow up GERD and constipation. Last seen in 11/2015. Last EGD 09/2015 with normal esophagus s/p dilation, few gastric polyps (fundic gland polyps) removed, normal second portion of the duodenum.   Overall doing well. Reflux well controlled as long as she takes pantoprazole BID, if she misses second dose she has recurrent symptoms. No dysphagia. BM good, taking Linzess as needed. No melena, brbpr.   This year she has had complete gallbladder work up in 06/2017 with negative u/s and HIDA. She had CT in 12/2017 when she presented to ED with abd pain and vomiting. CT unremarkable. She has had similar bouts of lower abd pain with vomiting ever since her back surgery over 10 years ago, went through rlq. Symptoms possibly related to adhesions. Has increased lower abd pain, mostly right sided with movement.   While in ED her istat HCG was positive at 6.7. She is not sexually active. She is well overdue for gyn exam in general and advised her to follow up with gyn.     Current Outpatient Medications  Medication Sig Dispense Refill  . ALPRAZolam (XANAX) 0.5 MG tablet Take 0.5 mg by mouth 2 (two) times daily as needed for anxiety.   5  . amLODipine (NORVASC) 10 MG tablet Take 10 mg by mouth daily.    Marland Kitchen atorvastatin (LIPITOR) 20 MG tablet Take 20 mg by mouth at bedtime.   12  . diclofenac (VOLTAREN) 75 MG EC tablet Take 1 tablet (75 mg total) by mouth 2 (two) times daily. Take with food 14 tablet 0  . irbesartan (AVAPRO) 300 MG tablet Take 300 mg by mouth daily.  12  . linaclotide (LINZESS) 290 MCG CAPS capsule Take 290 mcg by mouth daily before breakfast.    . MYRBETRIQ 25 MG TB24 tablet Take 25 mg by mouth daily.      Marland Kitchen oxyCODONE-acetaminophen (PERCOCET/ROXICET) 5-325 MG tablet Take 1 tablet by mouth every 4 (four) hours as needed. 15 tablet 0  . pantoprazole (PROTONIX) 40 MG tablet TAKE 1 TABLET (40 MG TOTAL) BY MOUTH 2 (TWO) TIMES DAILY BEFORE A MEAL. (Patient taking differently: Take 40 mg by mouth 2 (two) times daily. ) 180 tablet 0  . phenytoin (DILANTIN) 100 MG ER capsule Take 100-200 mg by mouth See admin instructions. 282m in the morning and 1045mat bedtime     No current facility-administered medications for this visit.     Allergies as of 01/10/2018 - Review Complete 01/10/2018  Allergen Reaction Noted  . Benadryl [diphenhydramine hcl] Rash 01/17/2012    ROS:  General: Negative for anorexia, weight loss, fever, chills, fatigue, weakness. ENT: Negative for hoarseness, difficulty swallowing , nasal congestion. CV: Negative for chest pain, angina, palpitations, dyspnea on exertion, peripheral edema.  Respiratory: Negative for dyspnea at rest, dyspnea on exertion, cough, sputum, wheezing.  GI: See history of present illness. GU:  Negative for dysuria, hematuria, urinary incontinence, urinary frequency, nocturnal urination.  Endo: Negative for unusual weight change.    Physical Examination:   BP 130/82   Pulse 96   Temp 97.6 F (36.4 C) (Oral)   Ht 5' 6"  (1.676 m)   Wt 171 lb 12.8  oz (77.9 kg)   LMP 01/02/2018 (Approximate)   BMI 27.73 kg/m   General: Well-nourished, well-developed in no acute distress.  Eyes: No icterus. Mouth: Oropharyngeal mucosa moist and pink , no lesions erythema or exudate. Lungs: Clear to auscultation bilaterally.  Heart: Regular rate and rhythm, no murmurs rubs or gallops.  Abdomen: Bowel sounds are normal, nontender, nondistended, no hepatosplenomegaly or masses, no abdominal bruits or hernia , no rebound or guarding.   Extremities: No lower extremity edema. No clubbing or deformities. Neuro: Alert and oriented x 4   Skin: Warm and dry, no jaundice.     Psych: Alert and cooperative, normal mood and affect.  Labs:  Lab Results  Component Value Date   CREATININE 0.71 12/26/2017   BUN 8 12/26/2017   NA 137 12/26/2017   K 3.5 12/26/2017   CL 102 12/26/2017   CO2 25 12/26/2017   Lab Results  Component Value Date   ALT 18 12/26/2017   AST 20 12/26/2017   ALKPHOS 86 12/26/2017   BILITOT 0.6 12/26/2017   Lab Results  Component Value Date   WBC 9.0 12/26/2017   HGB 13.9 12/26/2017   HCT 40.6 12/26/2017   MCV 85.5 12/26/2017   PLT 233 12/26/2017    Imaging Studies: Ct Abdomen Pelvis W Contrast  Result Date: 12/26/2017 CLINICAL DATA:  Abdominal pain, nausea and vomiting. Pain resulting in syncope. History of esophageal biopsy and dilatation. EXAM: CT ABDOMEN AND PELVIS WITH CONTRAST TECHNIQUE: Multidetector CT imaging of the abdomen and pelvis was performed using the standard protocol following bolus administration of intravenous contrast. CONTRAST:  176m ISOVUE-300 IOPAMIDOL (ISOVUE-300) INJECTION 61% COMPARISON:  CT abdomen and pelvis September 08, 2014 FINDINGS: LOWER CHEST: LEFT lower lobe granuloma. Included heart size is normal. No pericardial effusion. HEPATOBILIARY: Liver and gallbladder are normal. PANCREAS: Normal. SPLEEN: Normal. ADRENALS/URINARY TRACT: Kidneys are orthotopic, demonstrating symmetric enhancement. Early excretion of contrast decreases sensitivity for small nonobstructing nephrolithiasis. No nephrolithiasis, hydronephrosis or solid renal masses. The unopacified ureters are normal in course and caliber. Delayed imaging through the kidneys demonstrates symmetric prompt contrast excretion within the proximal urinary collecting system. Urinary bladder is adequately distended and unremarkable. Normal adrenal glands. STOMACH/BOWEL: The stomach, small and large bowel are normal in course and caliber without inflammatory changes. Moderate duodenal diverticulum with air-fluid level. Normal appendix. VASCULAR/LYMPHATIC: Aortoiliac  vessels are normal in course and caliber. No lymphadenopathy by CT size criteria. REPRODUCTIVE: Normal.  2.4 cm RIGHT dominant follicle. OTHER: No intraperitoneal free fluid or free air. MUSCULOSKELETAL: Nonacute. Streak artifact from L4-5 interbody disc. Mild RIGHT sacroiliac osteoarthrosis. IMPRESSION: 1. No acute intra-abdominal or pelvic process. Electronically Signed   By: CElon AlasM.D.   On: 12/26/2017 21:32   Mr Shoulder Left Wo Contrast  Result Date: 12/21/2017 CLINICAL DATA:  Left shoulder pain for 3 months with pain and weakness. EXAM: MRI OF THE LEFT SHOULDER WITHOUT CONTRAST TECHNIQUE: Multiplanar, multisequence MR imaging of the shoulder was performed. No intravenous contrast was administered. COMPARISON:  Radiographs 11/23/2017 FINDINGS: Rotator cuff: Minimal rotator cuff tendinopathy/tendinosis. No partial or full-thickness tear. Muscles:  Normal Biceps long head:  Intact Acromioclavicular Joint: Mild to moderate degenerative changes for age with mild pannus formation. Type 1-2 acromion. No significant lateral downsloping or undersurface spurring. Glenohumeral Joint: No degenerative changes. Small joint effusion. There is thickening of the capsular structures in the axillary recess which can be seen with adhesive capsulitis or synovitis. Labrum: Posterior labral tear suspected. The anterior and superior labrum appear normal. Bones:  No acute bony findings. Other: No subacromial/subdeltoid fluid collections to suggest bursitis. IMPRESSION: 1. Minimal rotator cuff tendinopathy/tendinosis. No partial or full-thickness tear. 2. Intact long head biceps tendon. 3. Posterior labral tear. The anterior and superior labrum appear normal. 4. Mild to moderate AC joint degenerative changes but no other significant findings for bony impingement. 5. Glenohumeral joint effusion. There is thickening of the capsular structures in the axillary recess which can be seen with adhesive capsulitis or synovitis.  Electronically Signed   By: Marijo Sanes M.D.   On: 12/21/2017 16:49

## 2018-01-11 ENCOUNTER — Encounter (HOSPITAL_COMMUNITY): Payer: Self-pay

## 2018-01-11 ENCOUNTER — Ambulatory Visit (HOSPITAL_COMMUNITY): Payer: Medicare Other

## 2018-01-11 DIAGNOSIS — M25612 Stiffness of left shoulder, not elsewhere classified: Secondary | ICD-10-CM | POA: Diagnosis not present

## 2018-01-11 DIAGNOSIS — M25512 Pain in left shoulder: Secondary | ICD-10-CM

## 2018-01-11 DIAGNOSIS — R29898 Other symptoms and signs involving the musculoskeletal system: Secondary | ICD-10-CM | POA: Diagnosis not present

## 2018-01-11 NOTE — Therapy (Signed)
[facility] [place], [place], [address] Phone: [phone]   Fax:  [phone]  Occupational Therapy Treatment  Patient Details  Name: [name] MRN: [id number] Date of Birth: 20-Nov-1970 Referring Provider (OT): Dr. Arther Abbott   Encounter Date: 01/11/2018  OT End of Session - 01/11/18 0928    Visit Number  2    Number of Visits  12    Date for OT Re-Evaluation  02/14/18   mini reassessment: 01/31/18   Authorization Type  1) [employer] medicare 2) [employer] [place] 3) [employer] medicare    Authorization Time Period  no copy. no visit limit. based on medical necessity.     OT Start Time  [phone]    OT Stop Time  0945    OT Time Calculation (min)  40 min    Activity Tolerance  Patient tolerated treatment well    Behavior During Therapy  [facility] for tasks assessed/performed       Past Medical History:  Diagnosis Date  . Anxiety   . Arthritis   . Balance problems   . Chronic abdominal pain   . Chronic knee pain   . Constipation   . Difficulty walking   . GERD (gastroesophageal reflux disease)   . Headache   . HTN (hypertension)   . Low back pain   . Panic attacks   . Polyuria   . Seizures ([place])    unknown etiology; on dilantin; last seizures was a few years ago.  . Sleep apnea     Past Surgical History:  Procedure Laterality Date  . BACK SURGERY     complicated by ureteral injury  . CHONDROPLASTY Right 08/31/2016   Procedure: CHONDROPLASTY RIGHT FEMUR;  Surgeon: Carole Civil, MD;  Location: [facility];  Service: Orthopedics;  Laterality: Right;  . ESOPHAGOGASTRODUODENOSCOPY N/A 09/11/2015   Procedure: ESOPHAGOGASTRODUODENOSCOPY (EGD);  Surgeon: Daneil Dolin, MD;  Location: [facility];  Service: Endoscopy;  Laterality: N/A;  0730  . ESOPHAGOGASTRODUODENOSCOPY (EGD) WITH ESOPHAGEAL DILATION N/A 03/20/2013   Dr. Rourk:Normal EGD-status post passage of a Maloney dilator/Status post esophageal biopsy., benign path   . KNEE ARTHROSCOPY WITH MEDIAL MENISECTOMY Right 08/31/2016   Procedure: KNEE ARTHROSCOPY WITH MEDIAL MENISECTOMY;  Surgeon: Carole Civil, MD;  Location: [facility];  Service: Orthopedics;  Laterality: Right;  . MALONEY DILATION N/A 09/11/2015   Procedure: Venia Minks DILATION;  Surgeon: Daneil Dolin, MD;  Location: [facility];  Service: Endoscopy;  Laterality: N/A;    There were no vitals filed for this visit.  Subjective Assessment - 01/11/18 0925    Subjective   S: The stretches hurt some.     Special Tests  FOTO score: 59/100    Currently in Pain?  No/denies         [facility] OT Assessment - 01/11/18 0925      Assessment   Medical Diagnosis  Left shoulder impingement      Precautions   Precautions  None               OT Treatments/Exercises (OP) - 01/11/18 0925      Exercises   Exercises  Shoulder      Shoulder Exercises: Supine   Protraction  PROM;5 reps;AAROM;10 reps    Horizontal ABduction  PROM;5 reps;AAROM;10 reps    External Rotation  PROM;5 reps;AAROM;10 reps    Internal Rotation  PROM;5 reps;AAROM;10 reps    Flexion  PROM;5 reps;AAROM;10 reps  ABduction  PROM;5 reps;AAROM;10 reps      Shoulder Exercises: Standing   Protraction  AAROM;10 reps    Horizontal ABduction  AAROM;10 reps    External Rotation  AAROM;10 reps    Internal Rotation  AAROM;10 reps    Flexion  AAROM;10 reps    ABduction  AAROM;10 reps    Extension  Theraband;10 reps    Theraband Level (Shoulder Extension)  Level 2 (Red)    Row  Theraband;10 reps    Theraband Level (Shoulder Row)  Level 2 (Red)      Shoulder Exercises: ROM/Strengthening   Wall Wash  1'    Other ROM/Strengthening Exercises  PVC pipe slide; 10X      Manual Therapy   Manual Therapy  Soft tissue mobilization    Manual therapy comments  manual therapy completed prior to exercises.    Soft tissue mobilization  Myofascial release and manual stretching completed to left upper arm, trapezius, and scapularis  region to decrease fascial restrictions and increase joint mobility in a pain free zone.              OT Education - 01/11/18 2355    Education Details  Reviewed therapy goals.     Person(s) Educated  Patient    Methods  Explanation    Comprehension  Verbalized understanding       OT Short Term Goals - 01/11/18 0927      OT SHORT TERM GOAL #1   Title  Patient will be educated and independent with HEP to facilitate her progress in therapy and allow her to participate in daily tasks with increased comfort.     Time  3    Period  Weeks    Status  On-going      OT SHORT TERM GOAL #2   Title  Patient will decrease LUE fascial restrictions to mod amount in order to increase functional mobility required by her LUE with less pain.    Time  3    Period  Weeks    Status  On-going      OT SHORT TERM GOAL #3   Title  Patient will increase P/ROM in LUE to WNL in order to complete reaching tasks below shoulder with less difficulty.     Time  3    Period  Weeks    Status  On-going        OT Long Term Goals - 01/11/18 7322      OT LONG TERM GOAL #1   Title  Patient will return to highest level of independence while using her LUE for all daily tasks and being able to complete woodworking tasks with less difficulty.     Time  6    Period  Weeks    Status  On-going      OT LONG TERM GOAL #2   Title  Patient will increase A/ROM of LUE to WNL to increase ability to complete functional reaching tasks above shoulder with less pain and difficulty.     Time  6    Period  Weeks    Status  On-going      OT LONG TERM GOAL #3   Title  Patient will increase LUE strength to 4+/5 or greater in order to complete basic lifting tasks and be able to complete woodworking tasks with less difficulty.    Time  6    Period  Weeks    Status  On-going      OT LONG  TERM GOAL #4   Title  Patient will decrease pain level in LUE to 3/10 or less when completing her woodworking tasks.     Time  6     Period  Weeks    Status  On-going      OT LONG TERM GOAL #5   Title  Patient will decrease fascial restrictions to min amount or less in order to increase functional mobility and decrease pain level when completing reaching tasks.     Time  6    Period  Weeks    Status  On-going            Plan - 01/11/18 0945    Clinical Impression Statement  A: Initiated myofascial release, manual stretching, and AA/ROM. Patient had tenderness during myofascial release along the upper trapezius region. She was able to achieve further passive ROM this date compared to evaluation.     Plan  P: Attempt A/ROM supine and if able to tolerate complete standing as well.     Consulted and Agree with Plan of Care  Patient       Patient will benefit from skilled therapeutic intervention in order to improve the following deficits and impairments:  Decreased range of motion, Increased fascial restrictions, Impaired UE functional use, Pain, Increased muscle spasms, Decreased strength  Visit Diagnosis: Other symptoms and signs involving the musculoskeletal system  Acute pain of left shoulder  Stiffness of left shoulder, not elsewhere classified    Problem List Patient Active Problem List   Diagnosis Date Noted  . 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 Primary osteoarthritis of right knee   . Gastric polyp   . GERD (gastroesophageal reflux disease) 08/24/2015  . Hypoactive sexual desire disorder 05/14/2014  . Abdominal pain, chronic, epigastric 12/08/2013  . Constipation 12/08/2013  . Esophageal dysphagia 02/22/2013  . Abdominal pain, epigastric 02/22/2013  . LUQ pain 02/22/2013  . Difficulty in walking(719.7) 01/25/2012  . Stiffness of joint, not elsewhere classified, ankle and foot 01/25/2012  . Balance problems 01/25/2012  . Ankle sprain 01/17/2012  . Ankle pain, right 01/17/2012   Ailene Ravel, OTR/L,CBIS  [phone]  01/11/2018, 10:22 AM  [facility] [address] [place], [place], [address] Phone: [phone]   Fax:  [phone]  Name: [name] MRN: [id number] Date of Birth: [birth year]

## 2018-01-14 NOTE — Assessment & Plan Note (Signed)
Continue Linzess as needed. Return to the office in two years or sooner if needed.

## 2018-01-14 NOTE — Assessment & Plan Note (Signed)
Doing well on pantoprazole bid. Continue current regimen as she failed going down to once daily dosing. Reinforced antireflux measures.

## 2018-01-15 ENCOUNTER — Ambulatory Visit (HOSPITAL_COMMUNITY): Payer: Medicare Other | Admitting: Specialist

## 2018-01-15 NOTE — Progress Notes (Signed)
CC'D TO PCP °

## 2018-01-17 ENCOUNTER — Ambulatory Visit (HOSPITAL_COMMUNITY): Payer: Medicare Other

## 2018-01-17 ENCOUNTER — Encounter (HOSPITAL_COMMUNITY): Payer: Self-pay

## 2018-01-17 DIAGNOSIS — M25612 Stiffness of left shoulder, not elsewhere classified: Secondary | ICD-10-CM | POA: Diagnosis not present

## 2018-01-17 DIAGNOSIS — M25512 Pain in left shoulder: Secondary | ICD-10-CM | POA: Diagnosis not present

## 2018-01-17 DIAGNOSIS — R29898 Other symptoms and signs involving the musculoskeletal system: Secondary | ICD-10-CM

## 2018-01-17 NOTE — Therapy (Signed)
[facility], [place], [address] Phone: [phone]   Fax:  [phone]  Occupational Therapy Treatment  Patient Details  Name: [name] MRN: [id number] Date of Birth: [birth year] Referring Provider (OT): Dr. Arther Abbott   Encounter Date: 01/17/2018  OT End of Session - 01/17/18 1024    Visit Number  3    Number of Visits  12    Date for OT Re-Evaluation  02/14/18   mini reassessment: 01/31/18   Authorization Type  1) [employer] medicare 2) [employer] [place] 3) [employer] medicare    Authorization Time Period  no copy. no visit limit. based on medical necessity.     OT Start Time  [phone]    OT Stop Time  1030    OT Time Calculation (min)  43 min    Activity Tolerance  Patient tolerated treatment well    Behavior During Therapy  [facility] for tasks assessed/performed       Past Medical History:  Diagnosis Date  . Anxiety   . Arthritis   . Balance problems   . Chronic abdominal pain   . Chronic knee pain   . Constipation   . Difficulty walking   . GERD (gastroesophageal reflux disease)   . Headache   . HTN (hypertension)   . Low back pain   . Panic attacks   . Polyuria   . Seizures ([place])    unknown etiology; on dilantin; last seizures was a few years ago.  . Sleep apnea     Past Surgical History:  Procedure Laterality Date  . BACK SURGERY     complicated by ureteral injury  . CHONDROPLASTY Right 08/31/2016   Procedure: CHONDROPLASTY RIGHT FEMUR;  Surgeon: Carole Civil, MD;  Location: [facility];  Service: Orthopedics;  Laterality: Right;  . ESOPHAGOGASTRODUODENOSCOPY N/A 09/11/2015   Procedure: ESOPHAGOGASTRODUODENOSCOPY (EGD);  Surgeon: Daneil Dolin, MD;  [facility];  Service: Endoscopy;  Laterality: N/A;  0730  . ESOPHAGOGASTRODUODENOSCOPY (EGD) WITH ESOPHAGEAL DILATION N/A 03/20/2013   Dr. Rourk:Normal EGD-status post passage of a Maloney dilator/Status post esophageal biopsy., benign path   . KNEE ARTHROSCOPY WITH MEDIAL MENISECTOMY Right 08/31/2016   Procedure: KNEE ARTHROSCOPY WITH MEDIAL MENISECTOMY;  Surgeon: Carole Civil, MD;  Location: [facility];  Service: Orthopedics;  Laterality: Right;  . MALONEY DILATION N/A 09/11/2015   Procedure: Venia Minks DILATION;  Surgeon: Daneil Dolin, MD;  [facility];  Service: Endoscopy;  Laterality: N/A;    There were no vitals filed for this visit.  Subjective Assessment - 01/17/18 0959    Subjective   S: Nothing new.    Currently in Pain?  No/denies         [facility] OT Assessment - 01/17/18 0959      Assessment   Medical Diagnosis  Left shoulder impingement      Precautions   Precautions  None               OT Treatments/Exercises (OP) - 01/17/18 0959      Exercises   Exercises  Shoulder      Shoulder Exercises: Supine   Protraction  PROM;5 reps;AROM;12 reps    Horizontal ABduction  PROM;5 reps;AROM;12 reps    External Rotation  PROM;5 reps;AROM;12 reps    Internal Rotation  PROM;5 reps;AROM;12 reps    Flexion  PROM;5 reps;AROM;12 reps    ABduction  PROM;5 reps;AROM;12 reps      Shoulder Exercises:  Standing   Protraction  AROM;12 reps    Horizontal ABduction  AROM;12 reps    External Rotation  AROM;12 reps    Internal Rotation  AROM;12 reps    Flexion  AROM;12 reps    ABduction  AROM;12 reps    Extension  Theraband;10 reps    Theraband Level (Shoulder Extension)  Level 3 (Green)    Row  [employer] reps    Theraband Level (Shoulder Row)  Level 3 (Green)    Retraction  Theraband;10 reps    Theraband Level (Shoulder Retraction)  Level 3 (Green)      Shoulder Exercises: ROM/Strengthening   UBE (Upper Arm Bike)  Level 1 2' forward 2' reverse   pace: 3.5-4.0   X to V Arms  10X    Proximal Shoulder Strengthening, Supine  10X with no rest breaks    Proximal Shoulder Strengthening, Seated  12X with no rest breaks    Ball on Wall  1' flexion 1' abduction green ball      Manual Therapy    Manual Therapy  Soft tissue mobilization    Manual therapy comments  manual therapy completed prior to exercises.    Soft tissue mobilization  Myofascial release and manual stretching completed to left upper arm, trapezius, and scapularis region to decrease fascial restrictions and increase joint mobility in a pain free zone.                OT Short Term Goals - 01/11/18 0927      OT SHORT TERM GOAL #1   Title  Patient will be educated and independent with HEP to facilitate her progress in therapy and allow her to participate in daily tasks with increased comfort.     Time  3    Period  Weeks    Status  On-going      OT SHORT TERM GOAL #2   Title  Patient will decrease LUE fascial restrictions to mod amount in order to increase functional mobility required by her LUE with less pain.    Time  3    Period  Weeks    Status  On-going      OT SHORT TERM GOAL #3   Title  Patient will increase P/ROM in LUE to WNL in order to complete reaching tasks below shoulder with less difficulty.     Time  3    Period  Weeks    Status  On-going        OT Long Term Goals - 01/11/18 4585      OT LONG TERM GOAL #1   Title  Patient will return to highest level of independence while using her LUE for all daily tasks and being able to complete woodworking tasks with less difficulty.     Time  6    Period  Weeks    Status  On-going      OT LONG TERM GOAL #2   Title  Patient will increase A/ROM of LUE to WNL to increase ability to complete functional reaching tasks above shoulder with less pain and difficulty.     Time  6    Period  Weeks    Status  On-going      OT LONG TERM GOAL #3   Title  Patient will increase LUE strength to 4+/5 or greater in order to complete basic lifting tasks and be able to complete woodworking tasks with less difficulty.    Time  6    Period  Weeks    Status  On-going      OT LONG TERM GOAL #4   Title  Patient will decrease pain level in LUE to 3/10 or less  when completing her woodworking tasks.     Time  6    Period  Weeks    Status  On-going      OT LONG TERM GOAL #5   Title  Patient will decrease fascial restrictions to min amount or less in order to increase functional mobility and decrease pain level when completing reaching tasks.     Time  6    Period  Weeks    Status  On-going            Plan - 01/17/18 1024    Clinical Impression Statement  A: Pt was able to progress up to A/ROM standing and supine, progressed to green theraband for scapular strengthening and added ball on the wall and UBE bike. Moderate fascial restrictions palpated in left anterior deltoid region. Manual techniques completed to address.     Plan  P: Update HEP for scapular strengthening. Complete A/ROM prone.    Consulted and Agree with Plan of Care  Patient       Patient will benefit from skilled therapeutic intervention in order to improve the following deficits and impairments:  Decreased range of motion, Increased fascial restrictions, Impaired UE functional use, Pain, Increased muscle spasms, Decreased strength  Visit Diagnosis: Other symptoms and signs involving the musculoskeletal system  Acute pain of left shoulder  Stiffness of left shoulder, not elsewhere classified    Problem List Patient Active Problem List   Diagnosis Date Noted  . 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 Primary osteoarthritis of right knee   . Gastric polyp   . GERD (gastroesophageal reflux disease) 08/24/2015  . Hypoactive sexual desire disorder 05/14/2014  . Abdominal pain, chronic, epigastric 12/08/2013  . Constipation 12/08/2013  . Esophageal dysphagia 02/22/2013  . Abdominal pain, epigastric 02/22/2013  . LUQ pain 02/22/2013  . Difficulty in walking(719.7) 01/25/2012  . Stiffness of joint, not elsewhere classified, ankle and foot 01/25/2012  . Balance problems 01/25/2012  . Ankle sprain 01/17/2012  . Ankle pain, right  01/17/2012   Ailene Ravel, OTR/L,CBIS  [phone]  01/17/2018, 10:29 AM  [facility] [address] [place], [place], [address] Phone: [phone]   Fax:  [phone]  Name: [name] MRN: [id number] Date of Birth: April 02, 1971

## 2018-01-22 ENCOUNTER — Ambulatory Visit (HOSPITAL_COMMUNITY): Payer: Medicare Other

## 2018-01-22 ENCOUNTER — Encounter (HOSPITAL_COMMUNITY): Payer: Self-pay

## 2018-01-22 DIAGNOSIS — R29898 Other symptoms and signs involving the musculoskeletal system: Secondary | ICD-10-CM

## 2018-01-22 DIAGNOSIS — M25612 Stiffness of left shoulder, not elsewhere classified: Secondary | ICD-10-CM

## 2018-01-22 DIAGNOSIS — M25512 Pain in left shoulder: Secondary | ICD-10-CM | POA: Diagnosis not present

## 2018-01-22 NOTE — Patient Instructions (Signed)

## 2018-01-22 NOTE — Therapy (Signed)
[place], [place], [address] Phone: [phone]   Fax:  [phone]  Occupational Therapy Treatment  Patient Details  Name: [name] MRN: [id number] Date of Birth: 08-06-1970 Referring Provider (OT): Dr. Arther Abbott   Encounter Date: 01/22/2018  OT End of Session - 01/22/18 1101    Visit Number  4    Number of Visits  12    Date for OT Re-Evaluation  02/14/18   mini reassessment: 01/31/18   Authorization Type  1) [employer] medicare 2) [employer] [place] 3) [employer] medicare    Authorization Time Period  no copy. no visit limit. based on medical necessity.     OT Start Time  1037    OT Stop Time  1115    OT Time Calculation (min)  38 min    Activity Tolerance  Patient tolerated treatment well    Behavior During Therapy  WFL for tasks assessed/performed       Past Medical History:  Diagnosis Date  . Anxiety   . Arthritis   . Balance problems   . Chronic abdominal pain   . Chronic knee pain   . Constipation   . Difficulty walking   . GERD (gastroesophageal reflux disease)   . Headache   . HTN (hypertension)   . Low back pain   . Panic attacks   . Polyuria   . Seizures ([place])    unknown etiology; on dilantin; last seizures was a few years ago.  . Sleep apnea     Past Surgical History:  Procedure Laterality Date  . BACK SURGERY     complicated by ureteral injury  . CHONDROPLASTY Right 08/31/2016   Procedure: CHONDROPLASTY RIGHT FEMUR;  Surgeon: Carole Civil, MD;  Location: [facility];  Service: Orthopedics;  Laterality: Right;  . ESOPHAGOGASTRODUODENOSCOPY N/A 09/11/2015   Procedure: ESOPHAGOGASTRODUODENOSCOPY (EGD);  Surgeon: Daneil Dolin, MD;  [facility];  Service: Endoscopy;  Laterality: N/A;  0730  . ESOPHAGOGASTRODUODENOSCOPY (EGD) WITH ESOPHAGEAL DILATION N/A 03/20/2013   Dr. Rourk:Normal EGD-status post passage of a Maloney dilator/Status post esophageal biopsy., benign path   . KNEE ARTHROSCOPY WITH MEDIAL MENISECTOMY Right 08/31/2016   Procedure: KNEE ARTHROSCOPY WITH MEDIAL MENISECTOMY;  Surgeon: Carole Civil, MD;  Location: [facility];  Service: Orthopedics;  Laterality: Right;  . MALONEY DILATION N/A 09/11/2015   Procedure: Venia Minks DILATION;  Surgeon: Daneil Dolin, MD;  [facility]: [facility];  Service: Endoscopy;  Laterality: N/A;    There were no vitals filed for this visit.  Subjective Assessment - 01/22/18 1049    Subjective   S: It only hurts still when I'm reaching.     Currently in Pain?  No/denies         [facility] OT Assessment - 01/22/18 1050      Assessment   Medical Diagnosis  Left shoulder impingement      Precautions   Precautions  None               OT Treatments/Exercises (OP) - 01/22/18 1050      Exercises   Exercises  Shoulder      Shoulder Exercises: Supine   Protraction  PROM;5 reps    Horizontal ABduction  PROM;5 reps    External Rotation  PROM;5 reps    Internal Rotation  PROM;5 reps    Flexion  PROM;5 reps    ABduction  PROM;5 reps      Shoulder Exercises:  Prone   Flexion  AROM;12 reps    Extension  AROM;12 reps    External Rotation  AROM;12 reps    Internal Rotation  AROM;12 reps    Horizontal ABduction 1  AROM;12 reps    Horizontal ABduction 2  AROM;12 reps      Shoulder Exercises: Standing   Horizontal ABduction  AROM;12 reps    External Rotation  AROM;12 reps    Internal Rotation  AROM;12 reps    Flexion  AROM;12 reps    ABduction  AROM;12 reps    Extension  Theraband;10 reps    Theraband Level (Shoulder Extension)  Level 3 (Green)    Row  [employer] reps    Theraband Level (Shoulder Row)  Level 3 (Green)    Retraction  Theraband;10 reps    Theraband Level (Shoulder Retraction)  Level 3 (Green)      Shoulder Exercises: ROM/Strengthening   UBE (Upper Arm Bike)  Level 1 2' forward 2' reverse   pace: 4.0-4.5   X to V Arms  10X    Proximal Shoulder Strengthening, Seated  12X with no rest  breaks    Ball on Wall  1' flexion 1' abduction green ball      Manual Therapy   Manual Therapy  Soft tissue mobilization    Manual therapy comments  manual therapy completed prior to exercises.    Soft tissue mobilization  Myofascial release and manual stretching completed to left upper arm, trapezius, and scapularis region to decrease fascial restrictions and increase joint mobility in a pain free zone.              OT Education - 01/22/18 1113    Education Details  Patient is to stop scapular squeezes, shoulder flexion and abduction stretch. Continue with doorway stretch and complete internal rotation with towel held vertically instead of horizontal. Added green theraband scapular strengthening.     Person(s) Educated  Patient    Methods  Explanation;Demonstration;Verbal cues;Tactile cues;Handout    Comprehension  Verbalized understanding;Returned demonstration       OT Short Term Goals - 01/11/18 0927      OT SHORT TERM GOAL #1   Title  Patient will be educated and independent with HEP to facilitate her progress in therapy and allow her to participate in daily tasks with increased comfort.     Time  3    Period  Weeks    Status  On-going      OT SHORT TERM GOAL #2   Title  Patient will decrease LUE fascial restrictions to mod amount in order to increase functional mobility required by her LUE with less pain.    Time  3    Period  Weeks    Status  On-going      OT SHORT TERM GOAL #3   Title  Patient will increase P/ROM in LUE to WNL in order to complete reaching tasks below shoulder with less difficulty.     Time  3    Period  Weeks    Status  On-going        OT Long Term Goals - 01/11/18 8768      OT LONG TERM GOAL #1   Title  Patient will return to highest level of independence while using her LUE for all daily tasks and being able to complete woodworking tasks with less difficulty.     Time  6    Period  Weeks    Status  On-going  OT LONG TERM GOAL #2    Title  Patient will increase A/ROM of LUE to WNL to increase ability to complete functional reaching tasks above shoulder with less pain and difficulty.     Time  6    Period  Weeks    Status  On-going      OT LONG TERM GOAL #3   Title  Patient will increase LUE strength to 4+/5 or greater in order to complete basic lifting tasks and be able to complete woodworking tasks with less difficulty.    Time  6    Period  Weeks    Status  On-going      OT LONG TERM GOAL #4   Title  Patient will decrease pain level in LUE to 3/10 or less when completing her woodworking tasks.     Time  6    Period  Weeks    Status  On-going      OT LONG TERM GOAL #5   Title  Patient will decrease fascial restrictions to min amount or less in order to increase functional mobility and decrease pain level when completing reaching tasks.     Time  6    Period  Weeks    Status  On-going            Plan - 01/22/18 1122    Clinical Impression Statement  A: Added prone A/ROM and provided green theraband scapular strengthening to HEP. patient reports that she continues to have pain when reaching overhead (flexion and abduction). Mod fascial restrictions palpated in left anterior deltoid region and upper trapezius. Manual techniques completed to address. VC for form and technique.    Plan  P: Add 1# weight to standing and sidelying strengthening. Add therapy ball strengthening exercises.     Consulted and Agree with Plan of Care  Patient       Patient will benefit from skilled therapeutic intervention in order to improve the following deficits and impairments:  Decreased range of motion, Increased fascial restrictions, Impaired UE functional use, Pain, Increased muscle spasms, Decreased strength  Visit Diagnosis: Other symptoms and signs involving the musculoskeletal system  Acute pain of left shoulder  Stiffness of left shoulder, not elsewhere classified    Problem List Patient Active Problem List    Diagnosis Date Noted  . 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 Primary osteoarthritis of right knee   . Gastric polyp   . GERD (gastroesophageal reflux disease) 08/24/2015  . Hypoactive sexual desire disorder 05/14/2014  . Abdominal pain, chronic, epigastric 12/08/2013  . Constipation 12/08/2013  . Esophageal dysphagia 02/22/2013  . Abdominal pain, epigastric 02/22/2013  . LUQ pain 02/22/2013  . Difficulty in walking(719.7) 01/25/2012  . Stiffness of joint, not elsewhere classified, ankle and foot 01/25/2012  . Balance problems 01/25/2012  . Ankle sprain 01/17/2012  . Ankle pain, right 01/17/2012   Ailene Ravel, OTR/L,CBIS  [phone]  01/22/2018, 11:24 AM  [facility] [address] [place], [place], [address] Phone: [phone]   Fax:  [phone]  Name: MONICIA TSE MRN: [id number] Date of Birth: Aug 11, 1970

## 2018-01-24 ENCOUNTER — Ambulatory Visit (HOSPITAL_COMMUNITY): Payer: Medicare Other

## 2018-01-24 ENCOUNTER — Telehealth (HOSPITAL_COMMUNITY): Payer: Self-pay

## 2018-01-24 NOTE — Telephone Encounter (Signed)
Patient canceled her appt she is not feeling well.

## 2018-01-25 ENCOUNTER — Encounter: Payer: Self-pay | Admitting: Advanced Practice Midwife

## 2018-01-25 ENCOUNTER — Ambulatory Visit (INDEPENDENT_AMBULATORY_CARE_PROVIDER_SITE_OTHER): Payer: Medicare Other | Admitting: Advanced Practice Midwife

## 2018-01-25 VITALS — BP 129/89 | HR 105 | Ht 62.0 in | Wt 172.0 lb

## 2018-01-25 DIAGNOSIS — E349 Endocrine disorder, unspecified: Secondary | ICD-10-CM

## 2018-01-25 DIAGNOSIS — IMO0001 Reserved for inherently not codable concepts without codable children: Secondary | ICD-10-CM

## 2018-01-25 DIAGNOSIS — F520 Hypoactive sexual desire disorder: Secondary | ICD-10-CM

## 2018-01-25 DIAGNOSIS — R891 Abnormal level of hormones in specimens from other organs, systems and tissues: Secondary | ICD-10-CM | POA: Diagnosis not present

## 2018-01-25 DIAGNOSIS — R232 Flushing: Secondary | ICD-10-CM

## 2018-01-25 MED ORDER — CONJ ESTROG-MEDROXYPROGEST ACE 0.3-1.5 MG PO TABS: 1.0000 | ORAL_TABLET | Freq: Every day | ORAL | 3 refills | Status: DC

## 2018-01-26 LAB — BETA HCG QUANT (REF LAB)

## 2018-01-26 NOTE — Progress Notes (Signed)
[facility] Visit  Patient name: [name] MRN [id number]  Date of birth: 28-Jul-1970  CC & HPI:  NEVENA ROZENBERG is a 46 y.o. Caucasian female presenting today for elevated HCG level in ED a month ago (5.7)  ALso c/o low sex drive for 10 years.  Is happy w/partner, just no desire for sex.  Also has had hot flashes daily for over a month. The hot flashes are so uncomfortable and interfere w/her sleep that she would like treatment.    Pertinent History Reviewed:  Medical & Surgical Hx:   Past Medical History:  Diagnosis Date  . Anxiety   . Arthritis   . Balance problems   . Chronic abdominal pain   . Chronic knee pain   . Constipation   . Difficulty walking   . GERD (gastroesophageal reflux disease)   . Headache   . HTN (hypertension)   . Low back pain   . Panic attacks   . Polyuria   . Seizures ([place])    unknown etiology; on dilantin; last seizures was a few years ago.  . Sleep apnea    Past Surgical History:  Procedure Laterality Date  . BACK SURGERY     complicated by ureteral injury  . CHONDROPLASTY Right 08/31/2016   Procedure: CHONDROPLASTY RIGHT FEMUR;  Surgeon: Carole Civil, MD;  Location: [facility];  Service: Orthopedics;  Laterality: Right;  . ESOPHAGOGASTRODUODENOSCOPY N/A 09/11/2015   Procedure: ESOPHAGOGASTRODUODENOSCOPY (EGD);  Surgeon: Daneil Dolin, MD;  [facility];  Service: Endoscopy;  Laterality: N/A;  0730  . ESOPHAGOGASTRODUODENOSCOPY (EGD) WITH ESOPHAGEAL DILATION N/A 03/20/2013   Dr. Rourk:Normal EGD-status post passage of a Maloney dilator/Status post esophageal biopsy., benign path  . KNEE ARTHROSCOPY WITH MEDIAL MENISECTOMY Right 08/31/2016   Procedure: KNEE ARTHROSCOPY WITH MEDIAL MENISECTOMY;  Surgeon: Carole Civil, MD;  Location: [facility];  Service: Orthopedics;  Laterality: Right;  . MALONEY DILATION N/A 09/11/2015   Procedure: Venia Minks DILATION;  Surgeon: Daneil Dolin, MD;  [facility];  Service:  Endoscopy;  Laterality: N/A;   Family History  Problem Relation Age of Onset  . Throat cancer Father   . Heart disease Father   . Cancer Father   . Hypertension Father   . Diabetes Father   . Other Mother        esophageal stricture and gallbladder disease  . Thyroid disease Mother   . Other Sister        esophageal stricture and gallbladder problems.   . Diabetes Sister   . Hypertension Sister   . Thyroid disease Sister   . Heart disease Maternal Grandfather   . Diabetes Maternal Grandfather   . Arthritis Maternal Grandfather   . Colon cancer Neg Hx   . Liver disease Neg Hx   . Pancreatic cancer Neg Hx     Current Outpatient Medications:  .  ALPRAZolam (XANAX) 0.5 MG tablet, Take 0.5 mg by mouth 2 (two) times daily as needed for anxiety. , Disp: , Rfl: 5 .  amLODipine (NORVASC) 10 MG tablet, Take 10 mg by mouth daily., Disp: , Rfl:  .  atorvastatin (LIPITOR) 20 MG tablet, Take 20 mg by mouth at bedtime. , Disp: , Rfl: 12 .  diclofenac (VOLTAREN) 75 MG EC tablet, Take 1 tablet (75 mg total) by mouth 2 (two) times daily. Take with food, Disp: 14 tablet, Rfl: 0 .  irbesartan (AVAPRO) 300 MG tablet, Take 300 mg by mouth daily.,  Disp: , Rfl: 12 .  linaclotide (LINZESS) 290 MCG CAPS capsule, Take 1 capsule (290 mcg total) by mouth daily before breakfast., Disp: 90 capsule, Rfl: 3 .  MYRBETRIQ 25 MG TB24 tablet, Take 25 mg by mouth daily. , Disp: , Rfl:  .  oxyCODONE-acetaminophen (PERCOCET/ROXICET) 5-325 MG tablet, Take 1 tablet by mouth every 4 (four) hours as needed., Disp: 15 tablet, Rfl: 0 .  pantoprazole (PROTONIX) 40 MG tablet, Take 1 tablet (40 mg total) by mouth 2 (two) times daily before a meal., Disp: 180 tablet, Rfl: 3 .  phenytoin (DILANTIN) 100 MG ER capsule, Take 100-200 mg by mouth See admin instructions. 26m in the morning and 1029mat bedtime, Disp: , Rfl:  .  estrogen, conjugated,-medroxyprogesterone (PREMPRO) 0.3-1.5 MG tablet, Take 1 tablet by mouth daily., Disp:  90 tablet, Rfl: 3 Social History: Reviewed -  reports that she quit smoking about 23 years ago. Her smoking use included cigarettes. She has a 18.00 pack-year smoking history. She has never used smokeless tobacco.  Review of Systems:   Constitutional: Negative for fever and chills Eyes: Negative for visual disturbances Respiratory: Negative for shortness of breath, dyspnea Cardiovascular: Negative for chest pain or palpitations  Gastrointestinal: Negative for vomiting, diarrhea and constipation; no abdominal pain Genitourinary: Negative for dysuria and urgency, vaginal irritation or itching Musculoskeletal: Negative for back pain, joint pain, myalgias  Neurological: Negative for dizziness and headaches    Objective Findings:    Physical Examination: Vitals:   01/25/18 0918  BP: 129/89  Pulse: (!) 105   General appearance - well appearing, and in no distress Mental status - alert, oriented to person, place, and time Chest:  Normal respiratory effort Heart - normal rate and regular rhythm Musculoskeletal:  Normal range of motion without pain Extremities:  No edema    Results for orders placed or performed in visit on 01/25/18 (from the past 24 hour(s))  Beta hCG quant (ref lab)   Collection Time: 01/25/18 10:44 AM  Result Value Ref Range   hCG Quant <1 mIU/mL      Assessment & Plan:  A:   HCG of 5.7:  Reassured that this is not an abnormal finding.  Repeat HCG is <1  Low sex drive:  Not candidate for Addy d/t dilantin, declines SQ injection med.    Vasomotor sx:  Discussed risks/benefits of HRT, would like to try.  May also have + effect on sex drive P:  Prempro  0.1.5/4.0GQaily.  F/U 4-6 weeks to discuss effectiveness   FrChristin FudgeNM 01/26/2018 9:16 AM

## 2018-01-29 ENCOUNTER — Encounter (HOSPITAL_COMMUNITY): Payer: Self-pay

## 2018-01-29 ENCOUNTER — Ambulatory Visit (HOSPITAL_COMMUNITY): Payer: Medicare Other

## 2018-01-29 DIAGNOSIS — M25512 Pain in left shoulder: Secondary | ICD-10-CM

## 2018-01-29 DIAGNOSIS — R29898 Other symptoms and signs involving the musculoskeletal system: Secondary | ICD-10-CM | POA: Diagnosis not present

## 2018-01-29 DIAGNOSIS — M25612 Stiffness of left shoulder, not elsewhere classified: Secondary | ICD-10-CM | POA: Diagnosis not present

## 2018-01-29 NOTE — Therapy (Signed)
[facility] [address] , [place], [address] Phone: [phone]   Fax:  [phone]  Occupational Therapy Treatment Reassessment/discharge Patient Details  Name: [name] MRN: [id number] Date of Birth: 04/27/1970 Referring Provider (OT): Dr. Stanley Harrison   Encounter Date: 01/29/2018  OT End of Session - 01/29/18 1404    Visit Number  5    Number of Visits  12    Date for OT Re-Evaluation  02/14/18   mini reassessment: 01/31/18   Authorization Type  1) [employer] medicare 2) [employer] [place] 3) [employer] medicare    Authorization Time Period  no copy. no visit limit. based on medical necessity.     OT Start Time  1122   reassess and discharge   OT Stop Time  1150    OT Time Calculation (min)  28 min    Activity Tolerance  Patient tolerated treatment well    Behavior During Therapy  WFL for tasks assessed/performed       Past Medical History:  Diagnosis Date  . Anxiety   . Arthritis   . Balance problems   . Chronic abdominal pain   . Chronic knee pain   . Constipation   . Difficulty walking   . GERD (gastroesophageal reflux disease)   . Headache   . HTN (hypertension)   . Low back pain   . Panic attacks   . Polyuria   . Seizures ([facility])    unknown etiology; on dilantin; last seizures was a few years ago.  . Sleep apnea     Past Surgical History:  Procedure Laterality Date  . BACK SURGERY     complicated by ureteral injury  . CHONDROPLASTY Right 08/31/2016   Procedure: CHONDROPLASTY RIGHT FEMUR;  Surgeon: Harrison, Stanley E, MD;  Location: [facility];  Service: Orthopedics;  Laterality: Right;  . ESOPHAGOGASTRODUODENOSCOPY N/A 09/11/2015   Procedure: ESOPHAGOGASTRODUODENOSCOPY (EGD);  Surgeon: Robert M Rourk, MD;  [facility];  Service: Endoscopy;  Laterality: N/A;  0730  . ESOPHAGOGASTRODUODENOSCOPY (EGD) WITH ESOPHAGEAL DILATION N/A 03/20/2013   Dr. Rourk:Normal EGD-status post passage of a Maloney  dilator/Status post esophageal biopsy., benign path  . KNEE ARTHROSCOPY WITH MEDIAL MENISECTOMY Right 08/31/2016   Procedure: KNEE ARTHROSCOPY WITH MEDIAL MENISECTOMY;  Surgeon: Harrison, Stanley E, MD;  Location: [facility];  Service: Orthopedics;  Laterality: Right;  . MALONEY DILATION N/A 09/11/2015   Procedure: MALONEY DILATION;  Surgeon: Robert M Rourk, MD;  [facility];  Service: Endoscopy;  Laterality: N/A;    There were no vitals filed for this visit.  Subjective Assessment - 01/29/18 1124    Subjective   S: It only hurts with certain movements    Special Tests  FOTO score: 65/100    Currently in Pain?  No/denies         [facility] OT Assessment - 01/29/18 1124      Assessment   Medical Diagnosis  Left shoulder impingement    Referring Provider (OT)  Dr. Stanley Harrison      Precautions   Precautions  None      Prior Function   Level of Independence  Independent      AROM   Overall AROM Comments  Assessed seated. IR/er adducted    AROM Assessment Site  Shoulder    Right/Left Shoulder  Left    Left Shoulder Flexion  155 Degrees   previous: 11   Left Shoulder ABduction  160 Degrees   previous:   102   Left Shoulder Internal Rotation  90 Degrees   previous: same   Left Shoulder External Rotation  85 Degrees   previous: 60     PROM   Overall PROM Comments  Assessed supine. IR/er adducted.     PROM Assessment Site  Shoulder    Right/Left Shoulder  Left    Left Shoulder Flexion  170 Degrees   previous: 170   Left Shoulder ABduction  180 Degrees   previous: 145   Left Shoulder Internal Rotation  90 Degrees   previous: same   Left Shoulder External Rotation  85 Degrees   previous: 78     Strength   Overall Strength Comments  Assessed seated. IR/er adducted.    Strength Assessment Site  Shoulder    Right/Left Shoulder  Left    Left Shoulder Flexion  4/5   previous: 3+/5   Left Shoulder ABduction  3+/5   previous: same   Left Shoulder Internal Rotation  5/5    previous: same   Left Shoulder External Rotation  5/5   previous: 4-/5                      OT Education - 01/29/18 1404    Education Details  Reviewed progress in therapy, reviewed therapy goals. HEP updated. Added shoulder distraction stretch.    Person(s) Educated  Patient    Methods  Explanation;Demonstration;Verbal cues;Handout    Comprehension  Returned demonstration;Verbalized understanding       OT Short Term Goals - 01/29/18 1135      OT SHORT TERM GOAL #1   Title  Patient will be educated and independent with HEP to facilitate her progress in therapy and allow her to participate in daily tasks with increased comfort.     Time  3    Period  Weeks    Status  Achieved      OT SHORT TERM GOAL #2   Title  Patient will decrease LUE fascial restrictions to mod amount in order to increase functional mobility required by her LUE with less pain.    Time  3    Period  Weeks    Status  Achieved      OT SHORT TERM GOAL #3   Title  Patient will increase P/ROM in LUE to WNL in order to complete reaching tasks below shoulder with less difficulty.     Time  3    Period  Weeks    Status  Achieved        OT Long Term Goals - 01/29/18 1136      OT LONG TERM GOAL #1   Title  Patient will return to highest level of independence while using her LUE for all daily tasks and being able to complete woodworking tasks with less difficulty.     Time  6    Period  Weeks    Status  Achieved      OT LONG TERM GOAL #2   Title  Patient will increase A/ROM of LUE to WNL to increase ability to complete functional reaching tasks above shoulder with less pain and difficulty.     Time  6    Period  Weeks    Status  Achieved      OT LONG TERM GOAL #3   Title  Patient will increase LUE strength to 4+/5 or greater in order to complete basic lifting tasks and be able to complete woodworking tasks with less difficulty.      Time  6    Period  Weeks    Status  Partially Met       OT LONG TERM GOAL #4   Title  Patient will decrease pain level in LUE to 3/10 or less when completing her woodworking tasks.     Time  6    Period  Weeks    Status  Achieved      OT LONG TERM GOAL #5   Title  Patient will decrease fascial restrictions to min amount or less in order to increase functional mobility and decrease pain level when completing reaching tasks.     Time  6    Period  Weeks    Status  Not Met            Plan - 01/29/18 1404    Clinical Impression Statement  A: Reassessment completed this date. patient has met all short term goals and all long term goals except fascial restrictions decreasing to Min amount and reaching an overall LUE strength of 4+/5 which patient partially met. Discussed progress in therapy. patient reports that her ROM has increased and her pain has decreased since she started therapy. She feels confident enough to continue working on HEP at home and following up with referring MD in November.     Plan  P: D/C from therapy with HEP. Follow up with MD in November.     Consulted and Agree with Plan of Care  Patient       Patient will benefit from skilled therapeutic intervention in order to improve the following deficits and impairments:  Decreased range of motion, Increased fascial restrictions, Impaired UE functional use, Pain, Increased muscle spasms, Decreased strength  Visit Diagnosis: Other symptoms and signs involving the musculoskeletal system  Acute pain of left shoulder  Stiffness of left shoulder, not elsewhere classified    Problem List Patient Active Problem List   Diagnosis Date Noted  . 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 Primary osteoarthritis of right knee   . Gastric polyp   . GERD (gastroesophageal reflux disease) 08/24/2015  . Hypoactive sexual desire disorder 05/14/2014  . Abdominal pain, chronic, epigastric 12/08/2013  . Constipation 12/08/2013  . Esophageal dysphagia 02/22/2013   . Abdominal pain, epigastric 02/22/2013  . LUQ pain 02/22/2013  . Difficulty in walking(719.7) 01/25/2012  . Stiffness of joint, not elsewhere classified, ankle and foot 01/25/2012  . Balance problems 01/25/2012  . Ankle sprain 01/17/2012  . Ankle pain, right 01/17/2012     OCCUPATIONAL THERAPY DISCHARGE SUMMARY  Visits from Start of Care: 5  Current functional level related to goals / functional outcomes: See above   Remaining deficits: See above   Education / Equipment: See above Plan: Patient agrees to discharge.  Patient goals were met. Patient is being discharged due to meeting the stated rehab goals.  ?????        Ailene Ravel, OTR/L,CBIS  [phone] 01/29/2018, 2:07 PM  [facility] [address] [place], [place], [address] Phone: [phone]   Fax:  [phone]  Name: ROSALY LABARBERA MRN: [id number] Date of Birth: 19-Aug-1970

## 2018-01-29 NOTE — Patient Instructions (Signed)
Shoulder caudal glide  Sit up straight and hook the hand of the involve shoulder under seat.  Lean the trunk opposite the hooked hand and feel the shoulder joint distraction.  Hold for 30 seconds. Complete 1-2 times.    Doorway Stretch  Place each hand opposite each other on the doorway. (You can change where you feel the stretch by moving arms higher or lower.) Step through with one foot and bend front knee until a stretch is felt and hold. Step through with the opposite foot on the next rep. Hold for __30___ seconds. Repeat __2__times.    INTERNAL ROTATION TOWEL STRETCH - IR TOWEL  Gently pull up your affected arm behind your back with the assist of a towel  Hold for 30 seconds. Complete 2 times.    (Home) Extension: Isometric / Bilateral Arm Retraction - Sitting   Facing anchor, hold hands and elbow at shoulder height, with elbow bent.  Pull arms back to squeeze shoulder blades together. Repeat 10-15 times. 1-3 times/day.   (Clinic) Extension / Flexion (Assist)   Face anchor, pull arms back, keeping elbow straight, and squeze shoulder blades together. Repeat 10-15 times. 1-3 times/day.   Copyright  [employer]. All rights reserved.   (Home) Retraction: Row - Bilateral (Anchor)   Facing anchor, arms reaching forward, pull hands toward stomach, keeping elbows bent and at your sides and pinching shoulder blades together. Repeat 10-15 times. 1-3 times/day.   Copyright  V[employer]. All rights reserved.

## 2018-01-31 ENCOUNTER — Encounter (HOSPITAL_COMMUNITY): Payer: Self-pay

## 2018-02-05 ENCOUNTER — Encounter (HOSPITAL_COMMUNITY): Payer: Self-pay | Admitting: Specialist

## 2018-02-07 ENCOUNTER — Encounter (HOSPITAL_COMMUNITY): Payer: Self-pay

## 2018-02-12 ENCOUNTER — Encounter (HOSPITAL_COMMUNITY): Payer: Self-pay

## 2018-02-14 ENCOUNTER — Encounter (HOSPITAL_COMMUNITY): Payer: Self-pay

## 2018-02-28 ENCOUNTER — Ambulatory Visit: Payer: Medicaid Other | Admitting: Orthopedic Surgery

## 2018-03-14 ENCOUNTER — Ambulatory Visit (INDEPENDENT_AMBULATORY_CARE_PROVIDER_SITE_OTHER): Payer: Medicare Other | Admitting: Orthopedic Surgery

## 2018-03-14 ENCOUNTER — Encounter: Payer: Self-pay | Admitting: Orthopedic Surgery

## 2018-03-14 VITALS — BP 119/78 | HR 91 | Ht 62.0 in | Wt 173.0 lb

## 2018-03-14 DIAGNOSIS — M7542 Impingement syndrome of left shoulder: Secondary | ICD-10-CM | POA: Diagnosis not present

## 2018-03-14 NOTE — Progress Notes (Signed)
Chief Complaint  Patient presents with  . 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  . Shoulder impingement, left Yes    She will follow-up on an as-needed basis

## 2018-03-15 ENCOUNTER — Ambulatory Visit: Payer: Medicaid Other | Admitting: Advanced Practice Midwife

## 2018-03-23 DIAGNOSIS — K5904 Chronic idiopathic constipation: Secondary | ICD-10-CM | POA: Diagnosis not present

## 2018-03-23 DIAGNOSIS — D649 Anemia, unspecified: Secondary | ICD-10-CM | POA: Diagnosis not present

## 2018-03-23 DIAGNOSIS — Z0000 Encounter for general adult medical examination without abnormal findings: Secondary | ICD-10-CM | POA: Diagnosis not present

## 2018-03-23 DIAGNOSIS — G4730 Sleep apnea, unspecified: Secondary | ICD-10-CM | POA: Diagnosis not present

## 2018-03-23 DIAGNOSIS — E785 Hyperlipidemia, unspecified: Secondary | ICD-10-CM | POA: Diagnosis not present

## 2018-03-23 DIAGNOSIS — I10 Essential (primary) hypertension: Secondary | ICD-10-CM | POA: Diagnosis not present

## 2018-04-05 DIAGNOSIS — Z1212 Encounter for screening for malignant neoplasm of rectum: Secondary | ICD-10-CM | POA: Diagnosis not present

## 2018-04-05 DIAGNOSIS — Z1211 Encounter for screening for malignant neoplasm of colon: Secondary | ICD-10-CM | POA: Diagnosis not present

## 2018-06-15 ENCOUNTER — Encounter: Payer: Self-pay | Admitting: Orthopedic Surgery

## 2018-06-15 ENCOUNTER — Ambulatory Visit (INDEPENDENT_AMBULATORY_CARE_PROVIDER_SITE_OTHER): Payer: Medicare Other | Admitting: Orthopedic Surgery

## 2018-06-15 ENCOUNTER — Other Ambulatory Visit: Payer: Self-pay

## 2018-06-15 ENCOUNTER — Ambulatory Visit (INDEPENDENT_AMBULATORY_CARE_PROVIDER_SITE_OTHER): Payer: Medicare Other

## 2018-06-15 VITALS — BP 126/79 | HR 97 | Ht 65.0 in | Wt 171.0 lb

## 2018-06-15 DIAGNOSIS — M25561 Pain in right knee: Secondary | ICD-10-CM

## 2018-06-15 DIAGNOSIS — G8929 Other chronic pain: Secondary | ICD-10-CM

## 2018-06-15 NOTE — Progress Notes (Signed)
ESTABLISHED PATIENT NEW PROBLEM OFFICE VISIT  Chief Complaint  Patient presents with  . Follow-up    Recheck on right knee, DOS 08-31-16.    47 2 years after right knee arthroscopy presents with recurrent medial right knee pain no history of injury.  She complains of severe pain on the medial side of the right knee with a catching sensation.  She says she has no trauma.  Pain is been present for a few weeks now.  Nothing seems to make it better including occasional Percocet taken for other reasons     Review of Systems  Constitutional: Negative for chills and fever.  Musculoskeletal: Positive for joint pain.  Neurological: Negative for tingling, sensory change and focal weakness.     Past Medical History:  Diagnosis Date  . Anxiety   . Arthritis   . Balance problems   . Chronic abdominal pain   . Chronic knee pain   . Constipation   . Difficulty walking   . GERD (gastroesophageal reflux disease)   . Headache   . HTN (hypertension)   . Low back pain   . Panic attacks   . Polyuria   . Seizures ([place])    unknown etiology; on dilantin; last seizures was a few years ago.  . Sleep apnea     Past Surgical History:  Procedure Laterality Date  . BACK SURGERY     complicated by ureteral injury  . CHONDROPLASTY Right 08/31/2016   Procedure: CHONDROPLASTY RIGHT FEMUR;  Surgeon: Carole Civil, MD;  Location: [facility];  Service: Orthopedics;  Laterality: Right;  . ESOPHAGOGASTRODUODENOSCOPY N/A 09/11/2015   Procedure: ESOPHAGOGASTRODUODENOSCOPY (EGD);  Surgeon: Daneil Dolin, MD;  Location: [facility];  Service: Endoscopy;  Laterality: N/A;  0730  . ESOPHAGOGASTRODUODENOSCOPY (EGD) WITH ESOPHAGEAL DILATION N/A 03/20/2013   Dr. Rourk:Normal EGD-status post passage of a Maloney dilator/Status post esophageal biopsy., benign path  . KNEE ARTHROSCOPY WITH MEDIAL MENISECTOMY Right 08/31/2016   Procedure: KNEE ARTHROSCOPY WITH MEDIAL MENISECTOMY;  Surgeon: Carole Civil, MD;   Location: [facility];  Service: Orthopedics;  Laterality: Right;  . MALONEY DILATION N/A 09/11/2015   Procedure: Venia Minks DILATION;  Surgeon: Daneil Dolin, MD;  Location: [facility];  Service: Endoscopy;  Laterality: N/A;    Family History  Problem Relation Age of Onset  . Throat cancer Father   . Heart disease Father   . Cancer Father   . Hypertension Father   . Diabetes Father   . Other Mother        esophageal stricture and gallbladder disease  . Thyroid disease Mother   . Other Sister        esophageal stricture and gallbladder problems.   . Diabetes Sister   . Hypertension Sister   . Thyroid disease Sister   . Heart disease Maternal Grandfather   . Diabetes Maternal Grandfather   . Arthritis Maternal Grandfather   . Colon cancer Neg Hx   . Liver disease Neg Hx   . Pancreatic cancer Neg Hx    Social History   Tobacco Use  . Smoking status: Former Smoker    Packs/day: 2.00    Years: 9.00    Pack years: 18.00    Types: Cigarettes    Last attempt to quit: 04/04/1994    Years since quitting: 24.2  . Smokeless tobacco: Never Used  . Tobacco comment: smoked 8 years, quit 18 years ago in the 1996  Substance Use Topics  . Alcohol use: No  Alcohol/week: 0.0 standard drinks  . Drug use: No    Allergies  Allergen Reactions  . Benadryl [Diphenhydramine Hcl] Rash    No outpatient medications have been marked as taking for the 06/15/18 encounter (Office Visit) with Carole Civil, MD.    BP 126/79   Pulse 97   Ht 5' 5"  (1.651 m)   Wt 171 lb (77.6 kg)   BMI 28.46 kg/m   Physical Exam Vitals signs and nursing note reviewed.  Constitutional:      General: She is not in acute distress.    Appearance: Normal appearance. She is normal weight.  Musculoskeletal:     Right knee: She exhibits no effusion.       Legs:  Skin:    General: Skin is warm and dry.     Capillary Refill: Capillary refill takes less than 2 seconds.  Neurological:     General: No focal  deficit present.     Mental Status: She is alert.  Psychiatric:        Mood and Affect: Mood normal.        Behavior: Behavior normal.        Thought Content: Thought content normal.        Judgment: Judgment normal.     Right Knee Exam   Muscle Strength  The patient has normal right knee strength.  Tenderness  The patient is experiencing tenderness in the pes anserinus and medial joint line.  Range of Motion  Extension:  5 abnormal  Flexion: 130   Tests  McMurray:  Medial - positive  Varus: negative Valgus: negative Lachman:  Anterior - negative     Drawer:  Anterior - negative    Posterior - negative  Other  Erythema: absent Scars: absent Sensation: normal Pulse: present Swelling: none Effusion: no effusion present  Comments:  Scars from the portals from arthroscopy but no other scars        MEDICAL DECISION SECTION  X-ray was done in the office today.  The x-ray does not show any acute injury fracture dislocation or bone lesion  Please see dictated report for further detail No diagnosis found.  PLAN:  Symptoms suggest torn medial meniscus, recommend MRI to evaluate need for surgical intervention acute meniscal tear  No orders of the defined types were placed in this encounter.   Arther Abbott, MD  06/15/2018 10:22 AM

## 2018-06-15 NOTE — Patient Instructions (Signed)
Call [facility] to schedule the MRI [phone]

## 2018-06-25 ENCOUNTER — Ambulatory Visit
Admission: RE | Admit: 2018-06-25 | Discharge: 2018-06-25 | Disposition: A | Discharge status: Home / Self Care | Payer: Medicare Other | Source: Ambulatory Visit | Attending: Orthopedic Surgery | Admitting: Orthopedic Surgery

## 2018-06-25 ENCOUNTER — Other Ambulatory Visit: Payer: Self-pay

## 2018-06-25 DIAGNOSIS — G8929 Other chronic pain: Secondary | ICD-10-CM

## 2018-06-25 DIAGNOSIS — M25561 Pain in right knee: Principal | ICD-10-CM

## 2018-07-02 ENCOUNTER — Ambulatory Visit (INDEPENDENT_AMBULATORY_CARE_PROVIDER_SITE_OTHER): Payer: Medicare Other | Admitting: Orthopedic Surgery

## 2018-07-02 ENCOUNTER — Other Ambulatory Visit: Payer: Self-pay

## 2018-07-02 DIAGNOSIS — Z9889 Other specified postprocedural states: Secondary | ICD-10-CM

## 2018-07-02 DIAGNOSIS — M23321 Other meniscus derangements, posterior horn of medial meniscus, right knee: Secondary | ICD-10-CM

## 2018-07-02 NOTE — Progress Notes (Signed)
Virtual Visit via Telephone Note  I connected with [name] on 07/02/18 at 11:20 AM EDT by telephone and verified that I am speaking with the correct person using two identifiers.   I discussed the limitations, risks, security and privacy concerns of performing an evaluation and management service by telephone and the availability of in person appointments. I also discussed with the patient that there may be a patient responsible charge related to this service. The patient expressed understanding and agreed to proceed.  Chief complaint is right knee pain   History of Present Illness: 47yo  2 years after right knee arthroscopy presents with recurrent medial right knee pain no history of injury.  She complains of severe pain on the medial side of the right knee with a catching sensation.  She says she has no trauma.  Pain is been present for a few weeks now.  Nothing seems to make it better including occasional Percocet taken for other reasons    Patient was sent for MRI  Review of systems musculoskeletal no other joint complaints muscle spasms numbness or tingling   Observations/Objective:  MRI was personally reviewed.  Standard MRI images show a re-tear of the medial meniscus with truncation from previous surgery and possible re-tear with some extrusion some arthritis of the medial compartment  CLINICAL DATA:  Diffuse right knee pain for the past 2 weeks. No injury. Prior medial meniscectomy in May 2018.   EXAM: MRI OF THE RIGHT KNEE WITHOUT CONTRAST   TECHNIQUE: Multiplanar, multisequence MR imaging of the knee was performed. No intravenous contrast was administered.   COMPARISON:  Right knee x-rays dated June 15, 2018. MRI right knee dated July 30, 2016.   FINDINGS: MENISCI   Medial meniscus: The posterior root is severely attenuated. There may be a few intact fibers. There is slight extrusion of the body.   Lateral meniscus:  Intact.   LIGAMENTS    Cruciates:  Intact ACL and PCL.   Collaterals: Medial collateral ligament is intact. Lateral collateral ligament complex is intact.   CARTILAGE   Patellofemoral:  No chondral defect.   Medial:  Unchanged mild partial-thickness cartilage loss.   Lateral:  No chondral defect.   Joint: Unchanged trace joint effusion. Unchanged edema in the superolateral aspect of Hoffa's fat. No plical thickening.   Popliteal Fossa: Unchanged small Baker cyst. Intact popliteus tendon.   Extensor Mechanism: Intact quadriceps tendon and patellar tendon. Intact medial and lateral patellar retinaculum. Intact MPFL.   Bones: No focal marrow signal abnormality. No fracture or dislocation.   Other: None.   IMPRESSION: 1. 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 2. Unchanged mild partial-thickness cartilage loss in the medial compartment. 3. Unchanged focal abnormal edema in the superolateral aspect of Hoffa's fat pad, which can be seen in the setting of patellar maltracking.     Electronically Signed   By: Titus Dubin M.D.   On: 06/25/2018 09:24   Assessment and Plan:  Torn medial meniscus of the right knee which is a re-tear   Follow Up Instructions: The patient is still symptomatic so I have advised her to have surgery.  She will undergo arthroscopy of the right knee partial medial meniscectomy when we can get it on the schedule   I discussed the assessment and treatment plan with the patient. The patient was provided an opportunity to ask questions and all were answered. The patient agreed with the  plan and demonstrated an understanding of the instructions.   The patient was advised to call back or seek an in-person evaluation if the symptoms worsen or if the condition fails to improve as anticipated.  I provided 5 minutes of non-face-to-face time during this encounter.   Arther Abbott,  MD

## 2018-07-19 ENCOUNTER — Telehealth: Payer: Self-pay | Admitting: Radiology

## 2018-07-19 NOTE — Telephone Encounter (Signed)
Call patient to schedule surgery for knee when hospital allows scheduling

## 2018-07-25 DIAGNOSIS — M545 Low back pain: Secondary | ICD-10-CM | POA: Diagnosis not present

## 2018-07-25 DIAGNOSIS — M1711 Unilateral primary osteoarthritis, right knee: Secondary | ICD-10-CM | POA: Diagnosis not present

## 2018-07-25 DIAGNOSIS — I10 Essential (primary) hypertension: Secondary | ICD-10-CM | POA: Diagnosis not present

## 2018-08-15 ENCOUNTER — Other Ambulatory Visit: Payer: Self-pay | Admitting: Orthopedic Surgery

## 2018-08-15 NOTE — Telephone Encounter (Signed)
Put in orders June 11th

## 2018-08-28 ENCOUNTER — Other Ambulatory Visit: Payer: Self-pay | Admitting: Orthopedic Surgery

## 2018-09-04 ENCOUNTER — Other Ambulatory Visit (HOSPITAL_COMMUNITY): Payer: Self-pay | Admitting: Pulmonary Disease

## 2018-09-04 DIAGNOSIS — Z1231 Encounter for screening mammogram for malignant neoplasm of breast: Secondary | ICD-10-CM

## 2018-09-05 ENCOUNTER — Ambulatory Visit (HOSPITAL_COMMUNITY)
Admission: RE | Admit: 2018-09-05 | Discharge: 2018-09-05 | Disposition: A | Discharge status: Home / Self Care | Payer: Medicare Other | Source: Ambulatory Visit | Attending: Pulmonary Disease | Admitting: Pulmonary Disease

## 2018-09-05 ENCOUNTER — Other Ambulatory Visit: Payer: Self-pay

## 2018-09-05 DIAGNOSIS — Z1231 Encounter for screening mammogram for malignant neoplasm of breast: Secondary | ICD-10-CM | POA: Insufficient documentation

## 2018-09-06 NOTE — Patient Instructions (Addendum)
[name]  09/06/2018     @PREFPERIOPPHARMACY @   Your procedure is scheduled on  09/13/2018 .  Report to [facility] at  1000  A.M.  Call this number if you have problems the morning of surgery:  [phone]   Remember:  Do not eat or drink after midnight.                      Take these medicines the morning of surgery with A SIP OF WATER  Xanax(if needed), amlodipine, lexapro, avapro, myrbetriq, oxycodone(if needed), protonix, dilantin.    Do not wear jewelry, make-up or nail polish.  Do not wear lotions, powders, or perfumes, or deodorant.  Do not shave 48 hours prior to surgery.  Men may shave face and neck.  Do not bring valuables to the hospital.  [facility] is not responsible for any belongings or valuables.  Contacts, dentures or bridgework may not be worn into surgery.  Leave your suitcase in the car.  After surgery it may be brought to your room.  For patients admitted to the hospital, discharge time will be determined by your treatment team.  Patients discharged the day of surgery will not be allowed to drive home.   Name and phone number of your driver:   family Special instructions:  None  Please read over the following fact sheets that you were given. Anesthesia Post-op Instructions and Care and Recovery After Surgery       Arthroscopic Knee Ligament Repair, Care After This sheet gives you information about how to care for yourself after your procedure. Your health care provider may also give you more specific instructions. If you have problems or questions, contact your health care provider. What can I expect after the procedure? After the procedure, it is common to have:  Pain in your knee.  Bruising and swelling on your knee, calf, and ankle for 3-4 days.  Fatigue. Follow these instructions at home: If you have a brace or immobilizer:  Wear the brace or immobilizer as told by your health care provider. Remove it only as told by  your health care provider.  Loosen the splint or immobilizer if your toes tingle, become numb, or turn cold and blue.  Keep the brace or immobilizer clean. Bathing  Do not take baths, swim, or use a hot tub until your health care provider approves. Ask your health care provider if you can take showers.  Keep your bandage (dressing) dry until your health care provider says that it can be removed. Cover it and your brace or immobilizer with a watertight covering when you take a shower. Incision care   Follow instructions from your health care provider about how to take care of your incision. Make sure you: ? Wash your hands with soap and water before you change your bandage (dressing). If soap and water are not available, use hand sanitizer. ? Change your dressing as told by your health care provider. ? Leave stitches (sutures), skin glue, or adhesive strips in place. These skin closures may need to stay in place for 2 weeks or longer. If adhesive strip edges start to loosen and curl up, you may trim the loose edges. Do not remove adhesive strips completely unless your health care provider tells you to do that.  Check your incision area every day for signs of infection. Check for: ? More redness, swelling, or pain. ? More fluid or blood. ? Warmth. ?  Pus or a bad smell. Managing pain, stiffness, and swelling   If directed, put ice on the affected area. ? If you have a removable brace or immobilizer, remove it as told by your health care provider. ? Put ice in a plastic bag. ? Place a towel between your skin and the bag or between your brace or immobilizer and the bag. ? Leave the ice on for 20 minutes, 2-3 times a day.  Move your toes often to avoid stiffness and to lessen swelling.  Raise (elevate) the injured area above the level of your heart while you are sitting or lying down. Driving  Do not drive until your health care provider approves. If you have a brace or immobilizer on  your leg, ask your health care provider when it is safe for you to drive.  Do not drive or use heavy machinery while taking prescription pain medicine. Activity  Rest as directed. Ask your health care provider what activities are safe for you.  Do physical therapy exercises as told by your health care provider. Physical therapy will help you regain strength and motion in your knee.  Follow instructions from your health care provider about: ? When you may start motion exercises. ? When you may start riding a stationary bike and doing other low-impact activities. ? When you may start to jog and do other high-impact activities. Safety  Do not use the injured limb to support your body weight until your health care provider says that you can. Use crutches as told by your health care provider. General instructions  Do not use any products that contain nicotine or tobacco, such as cigarettes and e-cigarettes. These can delay bone healing. If you need help quitting, ask your health care provider.  To prevent or treat constipation while you are taking prescription pain medicine, your health care provider may recommend that you: ? Drink enough fluid to keep your urine clear or pale yellow. ? Take over-the-counter or prescription medicines. ? Eat foods that are high in fiber, such as fresh fruits and vegetables, whole grains, and beans. ? Limit foods that are high in fat and processed sugars, such as fried and sweet foods.  Take over-the-counter and prescription medicines only as told by your health care provider.  Keep all follow-up visits as told by your health care provider. This is important. Contact a health care provider if:  You have more redness, swelling, or pain around an incision.  You have more fluid or blood coming from an incision.  Your incision feels warm to the touch.  You have a fever.  You have pain or swelling in your knee, and it gets worse.  You have pain that does  not get better with medicine. Get help right away if:  You have trouble breathing.  You have pus or a bad smell coming from an incision.  You have numbness and tingling near the knee joint. Summary  After the procedure, it is common to have knee pain with bruising and swelling on your knee, calf, and ankle.  Icing your knee and raising your leg above the level of your heart will help control the pain and the swelling.  Do physical therapy exercises as told by your health care provider. Physical therapy will help you regain strength and motion in your knee. This information is not intended to replace advice given to you by your health care provider. Make sure you discuss any questions you have with your health care provider. Document  Released: 01/09/2013 Document Revised: 03/15/2016 Document Reviewed: 03/15/2016 Elsevier Interactive Patient Education  2019 [employer] Anesthesia, Adult, Care After This sheet gives you information about how to care for yourself after your procedure. Your health care provider may also give you more specific instructions. If you have problems or questions, contact your health care provider. What can I expect after the procedure? After the procedure, the following side effects are common:  Pain or discomfort at the IV site.  Nausea.  Vomiting.  Sore throat.  Trouble concentrating.  Feeling cold or chills.  Weak or tired.  Sleepiness and fatigue.  Soreness and body aches. These side effects can affect parts of the body that were not involved in surgery. Follow these instructions at home:  For at least 24 hours after the procedure:  Have a responsible adult stay with you. It is important to have someone help care for you until you are awake and alert.  Rest as needed.  Do not: ? Participate in activities in which you could fall or become injured. ? Drive. ? Use heavy machinery. ? Drink alcohol. ? Take sleeping pills or  medicines that cause drowsiness. ? Make important decisions or sign legal documents. ? Take care of children on your own. Eating and drinking  Follow any instructions from your health care provider about eating or drinking restrictions.  When you feel hungry, start by eating small amounts of foods that are soft and easy to digest (bland), such as toast. Gradually return to your regular diet.  Drink enough fluid to keep your urine pale yellow.  If you vomit, rehydrate by drinking water, juice, or clear broth. General instructions  If you have sleep apnea, surgery and certain medicines can increase your risk for breathing problems. Follow instructions from your health care provider about wearing your sleep device: ? Anytime you are sleeping, including during daytime naps. ? While taking prescription pain medicines, sleeping medicines, or medicines that make you drowsy.  Return to your normal activities as told by your health care provider. Ask your health care provider what activities are safe for you.  Take over-the-counter and prescription medicines only as told by your health care provider.  If you smoke, do not smoke without supervision.  Keep all follow-up visits as told by your health care provider. This is important. Contact a health care provider if:  You have nausea or vomiting that does not get better with medicine.  You cannot eat or drink without vomiting.  You have pain that does not get better with medicine.  You are unable to pass urine.  You develop a skin rash.  You have a fever.  You have redness around your IV site that gets worse. Get help right away if:  You have difficulty breathing.  You have chest pain.  You have blood in your urine or stool, or you vomit blood. Summary  After the procedure, it is common to have a sore throat or nausea. It is also common to feel tired.  Have a responsible adult stay with you for the first 24 hours after general  anesthesia. It is important to have someone help care for you until you are awake and alert.  When you feel hungry, start by eating small amounts of foods that are soft and easy to digest (bland), such as toast. Gradually return to your regular diet.  Drink enough fluid to keep your urine pale yellow.  Return to your normal activities as told by your health  care provider. Ask your health care provider what activities are safe for you. This information is not intended to replace advice given to you by your health care provider. Make sure you discuss any questions you have with your health care provider. Document Released: 06/27/2000 Document Revised: 11/04/2016 Document Reviewed: 11/04/2016 Elsevier Interactive Patient Education  2019 [employer].    How to Use Chlorhexidine Before Surgery Chlorhexidine gluconate (CHG) is a germ-killing (antiseptic) solution that is used to clean the skin. It gets rid of the bacteria that normally live on the skin. Cleaning your skin with CHG before surgery helps lower the risk for infection after surgery. To clean your skin before surgery, you may be given:  A CHG solution to use in the shower.  A prepackaged cloth that contains CHG. What are the risks? Risks of using CHG include:  A skin reaction.  Hearing loss, if CHG gets in your ears.  Eye injury, if CHG gets in your eyes and is not rinsed out.  The CHG product catching fire. Make sure that you avoid smoking and flames after applying CHG to your skin. Do not use CHG:  If you have a chlorhexidine allergy or have previously reacted to chlorhexidine.  On babies younger than 54 months of age. How to use CHG solution   Use CHG only as told by your health care provider, and follow the instructions on the label.  Use CHG solution while taking a shower. Follow these steps when using CHG solution (unless your health care provider gives you different instructions): 1. Start the shower. 2. Use  your normal soap and shampoo to wash your face and hair. 3. Turn off the shower or move out of the shower stream. 4. Pour the CHG onto a clean washcloth. Do not use any type of brush or rough-edged sponge. 5. Starting at your neck, lather your body down to your toes. Make sure you:  Pay special attention to the part of your body where you will be having surgery. Scrub this area for at least 1 minute.  Use the full amount of CHG as directed. Usually, this is one bottle.  Do not use CHG on your head or face. If the solution gets into your ears or eyes, rinse them well with water.  Avoid your genital area.  Avoid any areas of skin that have broken skin, cuts, or scrapes.  Scrub your back and under your arms. Make sure to wash skin folds. 6. Let the lather sit on your skin for 1-2 minutes or as long as told by your health care provider. 7. Thoroughly rinse your entire body in the shower. Make sure that all body creases and crevices are rinsed well. 8. Dry off with a clean towel. Do not put any substances on your body afterward, such as powder, lotion, or perfume. 9. Put on clean clothes or pajamas. 10. If it is the night before your surgery, sleep in clean sheets. How to use CHG prepackaged cloths   Only use CHG cloths as told by your health care provider, and follow the instructions on the label.  Use the CHG cloth on clean, dry skin. Follow these steps when using a CHG cloth (unless your health care provider gives you different instructions): 1. Using the CHG cloth, vigorously scrub the part of your body where you will be having surgery. Scrub using a back-and-forth motion for 3 minutes. The area on your body should be completely wet with CHG when you are done scrubbing.  2. Do not rinse. Discard the cloth and let the area air-dry for 1 minute. Do not put any substances on your body afterward, such as powder, lotion, or perfume. 3. Put on clean clothes or pajamas. 4. If it is the night  before your surgery, sleep in clean sheets. Contact a health care provider if:  Your skin gets irritated after scrubbing.  You have questions about using your solution or cloth. Get help right away if:  Your eyes become very red or swollen.  Your eyes itch badly.  Your skin itches badly and is red or swollen.  Your hearing changes.  You have trouble seeing.  You have swelling or tingling in your mouth or throat.  You have trouble breathing.  You swallow any chlorhexidine. Summary  Chlorhexidine gluconate (CHG) is a germ-killing (antiseptic) solution that is used to clean the skin. Cleaning your skin with CHG before surgery helps lower the risk for infection after surgery.  You may be given CHG to use at home. It may be in a bottle or in a prepackaged cloth to use on your skin. Carefully follow your health care provider's instructions and the instructions on the product label.  Do not use CHG if you have a chlorhexidine allergy.  Contact your health care provider if your skin gets irritated after scrubbing. This information is not intended to replace advice given to you by your health care provider. Make sure you discuss any questions you have with your health care provider. Document Released: 12/14/2011 Document Revised: 02/16/2017 Document Reviewed: 02/16/2017 Elsevier Interactive Patient Education  2019 [employer].

## 2018-09-10 ENCOUNTER — Encounter (HOSPITAL_COMMUNITY)
Admission: RE | Admit: 2018-09-10 | Discharge: 2018-09-10 | Disposition: A | Discharge status: Home / Self Care | Payer: Medicare Other | Source: Ambulatory Visit | Attending: Orthopedic Surgery | Admitting: Orthopedic Surgery

## 2018-09-10 ENCOUNTER — Other Ambulatory Visit (HOSPITAL_COMMUNITY)
Admission: RE | Admit: 2018-09-10 | Discharge: 2018-09-10 | Disposition: A | Discharge status: Home / Self Care | Payer: Medicare Other | Source: Ambulatory Visit | Attending: Orthopedic Surgery | Admitting: Orthopedic Surgery

## 2018-09-10 ENCOUNTER — Other Ambulatory Visit: Payer: Self-pay

## 2018-09-10 DIAGNOSIS — Z87891 Personal history of nicotine dependence: Secondary | ICD-10-CM | POA: Diagnosis not present

## 2018-09-10 DIAGNOSIS — M948X6 Other specified disorders of cartilage, lower leg: Secondary | ICD-10-CM | POA: Diagnosis not present

## 2018-09-10 DIAGNOSIS — S83241A Other tear of medial meniscus, current injury, right knee, initial encounter: Secondary | ICD-10-CM | POA: Diagnosis not present

## 2018-09-10 DIAGNOSIS — X58XXXA Exposure to other specified factors, initial encounter: Secondary | ICD-10-CM | POA: Diagnosis not present

## 2018-09-10 DIAGNOSIS — M2241 Chondromalacia patellae, right knee: Secondary | ICD-10-CM | POA: Diagnosis not present

## 2018-09-10 DIAGNOSIS — I10 Essential (primary) hypertension: Secondary | ICD-10-CM | POA: Diagnosis not present

## 2018-09-10 DIAGNOSIS — Z01812 Encounter for preprocedural laboratory examination: Secondary | ICD-10-CM | POA: Insufficient documentation

## 2018-09-10 DIAGNOSIS — G4730 Sleep apnea, unspecified: Secondary | ICD-10-CM | POA: Diagnosis not present

## 2018-09-10 DIAGNOSIS — Z1159 Encounter for screening for other viral diseases: Secondary | ICD-10-CM | POA: Insufficient documentation

## 2018-09-10 DIAGNOSIS — Z888 Allergy status to other drugs, medicaments and biological substances status: Secondary | ICD-10-CM | POA: Diagnosis not present

## 2018-09-10 DIAGNOSIS — M1990 Unspecified osteoarthritis, unspecified site: Secondary | ICD-10-CM | POA: Diagnosis not present

## 2018-09-10 DIAGNOSIS — Z8249 Family history of ischemic heart disease and other diseases of the circulatory system: Secondary | ICD-10-CM | POA: Diagnosis not present

## 2018-09-10 LAB — CBC
HCT: 37.7 % (ref 36.0–46.0)
Hemoglobin: 12.9 g/dL (ref 12.0–15.0)
MCH: 30.4 pg (ref 26.0–34.0)
MCHC: 34.2 g/dL (ref 30.0–36.0)
MCV: 88.7 fL (ref 80.0–100.0)
Platelets: 231 10*3/uL (ref 150–400)
RBC: 4.25 MIL/uL (ref 3.87–5.11)
RDW: 13.2 % (ref 11.5–15.5)
WBC: 4.8 10*3/uL (ref 4.0–10.5)
nRBC: 0 % (ref 0.0–0.2)

## 2018-09-10 LAB — BASIC METABOLIC PANEL
Anion gap: 13 (ref 5–15)
BUN: 15 mg/dL (ref 6–20)
CO2: 23 mmol/L (ref 22–32)
Calcium: 9.4 mg/dL (ref 8.9–10.3)
Chloride: 100 mmol/L (ref 98–111)
Creatinine, Ser: 0.75 mg/dL (ref 0.44–1.00)
GFR calc Af Amer: >60 mL/min (ref >60)
GFR calc non Af Amer: >60 mL/min (ref >60)
Glucose, Bld: 153 mg/dL — ABNORMAL HIGH (ref 70–99)
Potassium: 3.8 mmol/L (ref 3.5–5.1)
Sodium: 136 mmol/L (ref 135–145)

## 2018-09-11 ENCOUNTER — Encounter: Payer: Self-pay | Admitting: *Deleted

## 2018-09-11 LAB — NOVEL CORONAVIRUS, NAA (HOSP ORDER, SEND-OUT TO REF LAB; TAT 18-24 HRS): SARS-CoV-2, NAA: NOT DETECTED

## 2018-09-12 ENCOUNTER — Encounter: Payer: Self-pay | Admitting: Obstetrics and Gynecology

## 2018-09-12 ENCOUNTER — Ambulatory Visit (INDEPENDENT_AMBULATORY_CARE_PROVIDER_SITE_OTHER): Payer: Medicare Other | Admitting: Obstetrics and Gynecology

## 2018-09-12 ENCOUNTER — Other Ambulatory Visit: Payer: Self-pay

## 2018-09-12 VITALS — BP 134/88 | HR 108 | Ht 62.0 in | Wt 173.2 lb

## 2018-09-12 DIAGNOSIS — N951 Menopausal and female climacteric states: Secondary | ICD-10-CM | POA: Diagnosis not present

## 2018-09-12 NOTE — H&P (Addendum)
[name] is an 48 y.o. female.    History of Present Illness: 47yo  2 years after right knee arthroscopy presents with recurrent medial right knee pain no history of injury.  She complains of severe pain on the medial side of the right knee with a catching sensation.  She says she has no trauma.  Pain is been present for a few weeks now.  Nothing seems to make it better including occasional Percocet taken for other reasons     Past Medical History:  Diagnosis Date  . Anxiety   . Arthritis   . Balance problems   . Chronic abdominal pain   . Chronic knee pain   . Constipation   . Difficulty walking   . GERD (gastroesophageal reflux disease)   . Headache   . HTN (hypertension)   . Low back pain   . Panic attacks   . Polyuria   . Seizures ([place])    unknown etiology; on dilantin; last seizures was a few years ago.  . Sleep apnea     Past Surgical History:  Procedure Laterality Date  . BACK SURGERY     complicated by ureteral injury  . CHONDROPLASTY Right 08/31/2016   Procedure: CHONDROPLASTY RIGHT FEMUR;  Surgeon: Carole Civil, MD;  Location: [facility];  Service: Orthopedics;  Laterality: Right;  . ESOPHAGOGASTRODUODENOSCOPY N/A 09/11/2015   Procedure: ESOPHAGOGASTRODUODENOSCOPY (EGD);  Surgeon: Daneil Dolin, MD;  Location: [facility];  Service: Endoscopy;  Laterality: N/A;  0730  . ESOPHAGOGASTRODUODENOSCOPY (EGD) WITH ESOPHAGEAL DILATION N/A 03/20/2013   Dr. Rourk:Normal EGD-status post passage of a Maloney dilator/Status post esophageal biopsy., benign path  . KNEE ARTHROSCOPY WITH MEDIAL MENISECTOMY Right 08/31/2016   Procedure: KNEE ARTHROSCOPY WITH MEDIAL MENISECTOMY;  Surgeon: Carole Civil, MD;  Location: [facility];  Service: Orthopedics;  Laterality: Right;  . MALONEY DILATION N/A 09/11/2015   Procedure: Venia Minks DILATION;  Surgeon: Daneil Dolin, MD;  Location: [facility];  Service: Endoscopy;  Laterality: N/A;    Family History  Problem Relation  Age of Onset  . Throat cancer Father   . Heart disease Father   . Cancer Father   . Hypertension Father   . Diabetes Father   . Other Mother        esophageal stricture and gallbladder disease  . Thyroid disease Mother   . Other Sister        esophageal stricture and gallbladder problems.   . Diabetes Sister   . Hypertension Sister   . Thyroid disease Sister   . Heart disease Maternal Grandfather   . Diabetes Maternal Grandfather   . Arthritis Maternal Grandfather   . Colon cancer Neg Hx   . Liver disease Neg Hx   . Pancreatic cancer Neg Hx    Social History:  reports that she quit smoking about 24 years ago. Her smoking use included cigarettes. She has a 18.00 pack-year smoking history. She has never used smokeless tobacco. She reports that she does not drink alcohol or use drugs.  Allergies:  Allergies  Allergen Reactions  . Benadryl [Diphenhydramine Hcl] Rash    No medications prior to admission.    Results for orders placed or performed during the hospital encounter of 09/10/18 (from the past 48 hour(s))  Basic metabolic panel     Status: Abnormal   Collection Time: 09/10/18 11:24 AM  Result Value Ref Range   Sodium 136 135 - 145 mmol/L   Potassium 3.8 3.5 - 5.1  mmol/L   Chloride 100 98 - 111 mmol/L   CO2 23 22 - 32 mmol/L   Glucose, Bld 153 (H) 70 - 99 mg/dL   BUN 15 6 - 20 mg/dL   Creatinine, Ser 0.75 0.44 - 1.00 mg/dL   Calcium 9.4 8.9 - 10.3 mg/dL   GFR calc non Af Amer >60 >60 mL/min   GFR calc Af Amer >60 >60 mL/min   Anion gap 13 5 - 15    Comment: Performed at [facility], [address] [place], [place] [address]  CBC     Status: None   Collection Time: 09/10/18 11:24 AM  Result Value Ref Range   WBC 4.8 4.0 - 10.5 K/uL   RBC 4.25 3.87 - 5.11 MIL/uL   Hemoglobin 12.9 12.0 - 15.0 g/dL   HCT 37.7 36.0 - 46.0 %   MCV 88.7 80.0 - 100.0 fL   MCH 30.4 26.0 - 34.0 pg   MCHC 34.2 30.0 - 36.0 g/dL   RDW 13.2 11.5 - 15.5 %   Platelets 231 150 - 400 K/uL    nRBC 0.0 0.0 - 0.2 %    Comment: Performed at [facility], [address] [place], [place] [address]   No results found.  Review of Systems  Musculoskeletal: Positive for joint pain.  Neurological: Negative for tingling, tremors and weakness.  All other systems reviewed and are negative.   Last menstrual period 07/06/2018. Physical Exam Constitutional:      General: She is not in acute distress.    Appearance: Normal appearance. She is normal weight. She is not ill-appearing, toxic-appearing or diaphoretic.  HENT:     Head: Normocephalic and atraumatic.     Right Ear: External ear normal.     Left Ear: External ear normal.     Nose: Nose normal.     Mouth/Throat:     Mouth: Mucous membranes are moist.     Pharynx: Oropharynx is clear. No oropharyngeal exudate or posterior oropharyngeal erythema.  Eyes:     General: No scleral icterus.       Right eye: No discharge.        Left eye: No discharge.     Extraocular Movements: Extraocular movements intact.     Conjunctiva/sclera: Conjunctivae normal.     Pupils: Pupils are equal, round, and reactive to light.  Neck:     Musculoskeletal: Normal range of motion and neck supple.  Cardiovascular:     Rate and Rhythm: Normal rate. Rhythm irregular.     Pulses: Normal pulses.  Pulmonary:     Effort: Pulmonary effort is normal. No respiratory distress.     Breath sounds: No stridor. No wheezing.  Abdominal:     General: Abdomen is flat. There is no distension.     Palpations: Abdomen is soft.  Musculoskeletal:        General: No swelling, deformity or signs of injury.     Right knee: She exhibits decreased range of motion, effusion and abnormal meniscus. She exhibits no deformity, no laceration, no erythema, normal alignment, no LCL laxity, normal patellar mobility and no MCL laxity. Tenderness found. Medial joint line tenderness noted. No lateral joint line, no MCL, no LCL and no patellar tendon tenderness noted.     Right lower  leg: No edema.     Left lower leg: No edema.     Comments: Upper extremities  Lymphadenopathy:     Cervical: No cervical adenopathy.  Skin:    General: Skin  is warm and dry.     Capillary Refill: Capillary refill takes less than 2 seconds.  Neurological:     General: No focal deficit present.     Mental Status: She is alert. She is disoriented.     Sensory: No sensory deficit.     Motor: No weakness.     Coordination: Coordination normal.     Gait: Gait abnormal.     Deep Tendon Reflexes: Reflexes normal.  Psychiatric:        Mood and Affect: Mood normal.        Behavior: Behavior normal.        Thought Content: Thought content normal.        Judgment: Judgment normal.       Assessment/Plan CLINICAL DATA:  Diffuse right knee pain for the past 2 weeks. No injury. Prior medial meniscectomy in May 2018.   EXAM: MRI OF THE RIGHT KNEE WITHOUT CONTRAST   TECHNIQUE: Multiplanar, multisequence MR imaging of the knee was performed. No intravenous contrast was administered.   COMPARISON:  Right knee x-rays dated June 15, 2018. MRI right knee dated July 30, 2016.   FINDINGS: MENISCI   Medial meniscus: The posterior root is severely attenuated. There may be a few intact fibers. There is slight extrusion of the body.   Lateral meniscus:  Intact.   LIGAMENTS   Cruciates:  Intact ACL and PCL.   Collaterals: Medial collateral ligament is intact. Lateral collateral ligament complex is intact.   CARTILAGE   Patellofemoral:  No chondral defect.   Medial:  Unchanged mild partial-thickness cartilage loss.   Lateral:  No chondral defect.   Joint: Unchanged trace joint effusion. Unchanged edema in the superolateral aspect of Hoffa's fat. No plical thickening.   Popliteal Fossa: Unchanged small Baker cyst. Intact popliteus tendon.   Extensor Mechanism: Intact quadriceps tendon and patellar tendon. Intact medial and lateral patellar retinaculum. Intact MPFL.   Bones:  No focal marrow signal abnormality. No fracture or dislocation.   Other: None.   IMPRESSION:  1. 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 2. Unchanged mild partial-thickness cartilage loss in the medial compartment. 3. Unchanged focal abnormal edema in the superolateral aspect of Hoffa's fat pad, which can be seen in the setting of patellar maltracking.     Electronically Signed   By: Titus Dubin M.D.   On: 06/25/2018 09:24   Arthroscopy right knee with partial medial meniscectomy  Arther Abbott, MD 09/12/2018, 9:14 AM

## 2018-09-12 NOTE — Progress Notes (Signed)
Patient ID: [name], female   DOB: 05/22/1970, 48 y.o.   MRN: [id number]    [facility] Visit  @DATE @            Patient name: [name] MRN [id number]  Date of birth: 04-22-1970  CC & HPI:  YOMARIS PALECEK is a 48 y.o. female presenting today for possible menopause. Hasn't had period since April 3-5 2020. Was taking prempro to deal with hot flashes/night sweats but caused severe bleeding, so she ceased taking it. Before taking prempro was having regular 5 day periods. She hasn't had sex recently, unsure of last sexual intercourse.  ROS:  ROS  +perimenopaus -fever  Pertinent History Reviewed:   Reviewed: Significant for chronic knee pain & surgery Medical         Past Medical History:  Diagnosis Date  . Anxiety   . Arthritis   . Balance problems   . Chronic abdominal pain   . Chronic knee pain   . Constipation   . Difficulty walking   . GERD (gastroesophageal reflux disease)   . Headache   . HTN (hypertension)   . Low back pain   . Panic attacks   . Polyuria   . Seizures ([place])    unknown etiology; on dilantin; last seizures was a few years ago.  . Sleep apnea                               Surgical Hx:    Past Surgical History:  Procedure Laterality Date  . BACK SURGERY     complicated by ureteral injury  . CHONDROPLASTY Right 08/31/2016   Procedure: CHONDROPLASTY RIGHT FEMUR;  Surgeon: Carole Civil, MD;  Location: [facility];  Service: Orthopedics;  Laterality: Right;  . ESOPHAGOGASTRODUODENOSCOPY N/A 09/11/2015   Procedure: ESOPHAGOGASTRODUODENOSCOPY (EGD);  Surgeon: Daneil Dolin, MD;  Location: [facility];  Service: Endoscopy;  Laterality: N/A;  0730  . ESOPHAGOGASTRODUODENOSCOPY (EGD) WITH ESOPHAGEAL DILATION N/A 03/20/2013   Dr. Rourk:Normal EGD-status post passage of a Maloney dilator/Status post esophageal biopsy., benign path  . KNEE ARTHROSCOPY WITH MEDIAL MENISECTOMY Right 08/31/2016   Procedure: KNEE ARTHROSCOPY WITH MEDIAL  MENISECTOMY;  Surgeon: Carole Civil, MD;  Location: [facility];  Service: Orthopedics;  Laterality: Right;  . MALONEY DILATION N/A 09/11/2015   Procedure: Venia Minks DILATION;  Surgeon: Daneil Dolin, MD;  Location: [facility];  Service: Endoscopy;  Laterality: N/A;   Medications: Reviewed & Updated - see associated section                       Current Outpatient Medications:  .  ALPRAZolam (XANAX) 0.5 MG tablet, Take 0.5 mg by mouth 2 (two) times daily as needed for anxiety. , Disp: , Rfl: 5 .  amLODipine (NORVASC) 10 MG tablet, Take 10 mg by mouth daily., Disp: , Rfl:  .  atorvastatin (LIPITOR) 20 MG tablet, Take 20 mg by mouth at bedtime. , Disp: , Rfl: 12 .  escitalopram (LEXAPRO) 10 MG tablet, Take 10 mg by mouth daily., Disp: , Rfl:  .  irbesartan (AVAPRO) 300 MG tablet, Take 300 mg by mouth daily., Disp: , Rfl: 12 .  linaclotide (LINZESS) 290 MCG CAPS capsule, Take 1 capsule (290 mcg total) by mouth daily before breakfast., Disp: 90 capsule, Rfl: 3 .  MYRBETRIQ 25 MG TB24 tablet, Take 25 mg by mouth daily. ,  Disp: , Rfl:  .  Omega-3 Fatty Acids (FISH OIL) 1000 MG CAPS, Take 1,000 mg by mouth daily., Disp: , Rfl:  .  oxyCODONE-acetaminophen (PERCOCET/ROXICET) 5-325 MG tablet, Take 1 tablet by mouth every 4 (four) hours as needed., Disp: 15 tablet, Rfl: 0 .  pantoprazole (PROTONIX) 40 MG tablet, Take 1 tablet (40 mg total) by mouth 2 (two) times daily before a meal., Disp: 180 tablet, Rfl: 3 .  phenytoin (DILANTIN) 100 MG ER capsule, Take 100-200 mg by mouth See admin instructions. 237m in the morning and 1058mat bedtime, Disp: , Rfl:  .  estrogen, conjugated,-medroxyprogesterone (PREMPRO) 0.3-1.5 MG tablet, Take 1 tablet by mouth daily. (Patient not taking: Reported on 06/15/2018), Disp: 90 tablet, Rfl: 3   Social History: Reviewed -  reports that she quit smoking about 24 years ago. Her smoking use included cigarettes. She has a 18.00 pack-year smoking history. She has never used  smokeless tobacco.  Objective Findings:  Vitals: Blood pressure 134/88, pulse (!) 108, height 5' 2"  (1.575 m), weight 173 lb 3.2 oz (78.6 kg), last menstrual period 07/06/2018.  PHYSICAL EXAMINATION General appearance - alert, well appearing, and in no distress Mental status - alert, oriented to person, place, and time, normal mood, behavior, speech, dress, motor activity, and thought processes, affect appropriate to mood   PELVIC Not examined Discussion only  Assessment & Plan:   A:  1. Perimenopause  P:  1.  [place] T[place] 3. estradiol 4. Consider Provera after knee surgery 5. Excise skin tag removal near groin crease @ later date 6. 2 week f/u with televisit    By signing my name below, I, MaSamul Dadaattest that this documentation has been prepared under the direction and in the presence of FeJonnie KindMD. Electronically Signed: MaFelida06/10/20. 9:03 AM.  I personally performed the services described in this documentation, which was SCRIBED in my presence. The recorded information has been reviewed and considered accurate. It has been edited as necessary during review. JoJonnie KindMD

## 2018-09-13 ENCOUNTER — Ambulatory Visit (HOSPITAL_COMMUNITY): Payer: Medicare Other | Admitting: Anesthesiology

## 2018-09-13 ENCOUNTER — Other Ambulatory Visit: Payer: Self-pay

## 2018-09-13 ENCOUNTER — Ambulatory Visit (HOSPITAL_COMMUNITY)
Admission: RE | Admit: 2018-09-13 | Discharge: 2018-09-13 | Disposition: A | Discharge status: Home / Self Care | Payer: Medicare Other | Attending: Orthopedic Surgery | Admitting: Orthopedic Surgery

## 2018-09-13 ENCOUNTER — Encounter (HOSPITAL_COMMUNITY)
Admission: RE | Disposition: A | Discharge status: Home / Self Care | Payer: Self-pay | Source: Home / Self Care | Attending: Orthopedic Surgery

## 2018-09-13 ENCOUNTER — Encounter (HOSPITAL_COMMUNITY): Payer: Self-pay | Admitting: Anesthesiology

## 2018-09-13 DIAGNOSIS — M948X6 Other specified disorders of cartilage, lower leg: Secondary | ICD-10-CM | POA: Insufficient documentation

## 2018-09-13 DIAGNOSIS — M1990 Unspecified osteoarthritis, unspecified site: Secondary | ICD-10-CM | POA: Diagnosis not present

## 2018-09-13 DIAGNOSIS — Z1159 Encounter for screening for other viral diseases: Secondary | ICD-10-CM | POA: Insufficient documentation

## 2018-09-13 DIAGNOSIS — M238X1 Other internal derangements of right knee: Secondary | ICD-10-CM

## 2018-09-13 DIAGNOSIS — M2241 Chondromalacia patellae, right knee: Secondary | ICD-10-CM | POA: Insufficient documentation

## 2018-09-13 DIAGNOSIS — Z888 Allergy status to other drugs, medicaments and biological substances status: Secondary | ICD-10-CM | POA: Insufficient documentation

## 2018-09-13 DIAGNOSIS — G4730 Sleep apnea, unspecified: Secondary | ICD-10-CM | POA: Diagnosis not present

## 2018-09-13 DIAGNOSIS — I10 Essential (primary) hypertension: Secondary | ICD-10-CM | POA: Diagnosis not present

## 2018-09-13 DIAGNOSIS — S83241A Other tear of medial meniscus, current injury, right knee, initial encounter: Secondary | ICD-10-CM | POA: Insufficient documentation

## 2018-09-13 DIAGNOSIS — Z8249 Family history of ischemic heart disease and other diseases of the circulatory system: Secondary | ICD-10-CM | POA: Insufficient documentation

## 2018-09-13 DIAGNOSIS — Z87891 Personal history of nicotine dependence: Secondary | ICD-10-CM | POA: Insufficient documentation

## 2018-09-13 DIAGNOSIS — X58XXXA Exposure to other specified factors, initial encounter: Secondary | ICD-10-CM | POA: Insufficient documentation

## 2018-09-13 DIAGNOSIS — M85861 Other specified disorders of bone density and structure, right lower leg: Secondary | ICD-10-CM | POA: Diagnosis not present

## 2018-09-13 HISTORY — PX: KNEE ARTHROSCOPY WITH MEDIAL MENISECTOMY: SHX5651

## 2018-09-13 LAB — FOLLICLE STIMULATING HORMONE: FSH: 90.5 m[IU]/mL

## 2018-09-13 LAB — ESTRADIOL: Estradiol: 13.7 pg/mL

## 2018-09-13 LAB — TSH: TSH: 1.34 u[IU]/mL (ref 0.450–4.500)

## 2018-09-13 SURGERY — ARTHROSCOPY, KNEE, WITH MEDIAL MENISCECTOMY
Anesthesia: General | Laterality: Right

## 2018-09-13 MED ORDER — PROMETHAZINE HCL 25 MG/ML IJ SOLN: 6.2500 mg | INTRAMUSCULAR | Status: DC | PRN

## 2018-09-13 MED ORDER — CHLORHEXIDINE GLUCONATE 4 % EX LIQD: 60.0000 mL | Freq: Once | CUTANEOUS | Status: DC

## 2018-09-13 MED ORDER — GLYCOPYRROLATE PF 0.2 MG/ML IJ SOSY
PREFILLED_SYRINGE | INTRAMUSCULAR | Status: DC | PRN
  Administered 2018-09-13: .2 mg via INTRAVENOUS

## 2018-09-13 MED ORDER — SODIUM CHLORIDE 0.9 % IR SOLN
Status: DC | PRN
  Administered 2018-09-13 (×4): 3000 mL

## 2018-09-13 MED ORDER — LACTATED RINGERS IV SOLN
INTRAVENOUS | Status: DC | PRN
  Administered 2018-09-13: 10:00:00 via INTRAVENOUS

## 2018-09-13 MED ORDER — KETOROLAC TROMETHAMINE 30 MG/ML IJ SOLN
30.0000 mg | Freq: Once | INTRAMUSCULAR | Status: AC
  Administered 2018-09-13: 30 mg via INTRAVENOUS

## 2018-09-13 MED ORDER — HYDROCODONE-ACETAMINOPHEN 7.5-325 MG PO TABS
1.0000 | ORAL_TABLET | Freq: Once | ORAL | Status: AC
  Administered 2018-09-13: 1 via ORAL

## 2018-09-13 MED ORDER — HYDROCODONE-ACETAMINOPHEN 7.5-325 MG PO TABS: 1.0000 | ORAL_TABLET | Freq: Once | ORAL | Status: DC | PRN

## 2018-09-13 MED ORDER — ONDANSETRON HCL 4 MG/2ML IJ SOLN
4.0000 mg | Freq: Once | INTRAMUSCULAR | Status: AC
  Administered 2018-09-13: 4 mg via INTRAVENOUS

## 2018-09-13 MED ORDER — ONDANSETRON HCL 4 MG/2ML IJ SOLN
INTRAMUSCULAR | Status: AC
  Filled 2018-09-13: qty 2

## 2018-09-13 MED ORDER — HYDROMORPHONE HCL 1 MG/ML IJ SOLN
0.2500 mg | INTRAMUSCULAR | Status: DC | PRN
  Administered 2018-09-13: 0.5 mg via INTRAVENOUS
  Filled 2018-09-13: qty 0.5

## 2018-09-13 MED ORDER — BUPIVACAINE-EPINEPHRINE (PF) 0.5% -1:200000 IJ SOLN
INTRAMUSCULAR | Status: DC | PRN
  Administered 2018-09-13: 60 mL

## 2018-09-13 MED ORDER — HYDROCODONE-ACETAMINOPHEN 7.5-325 MG PO TABS
ORAL_TABLET | ORAL | Status: AC
  Filled 2018-09-13: qty 1

## 2018-09-13 MED ORDER — MIDAZOLAM HCL 2 MG/2ML IJ SOLN: 0.5000 mg | Freq: Once | INTRAMUSCULAR | Status: DC | PRN

## 2018-09-13 MED ORDER — FENTANYL CITRATE (PF) 100 MCG/2ML IJ SOLN
INTRAMUSCULAR | Status: DC | PRN
  Administered 2018-09-13 (×2): 50 ug via INTRAVENOUS

## 2018-09-13 MED ORDER — KETOROLAC TROMETHAMINE 30 MG/ML IJ SOLN
INTRAMUSCULAR | Status: AC
  Filled 2018-09-13: qty 1

## 2018-09-13 MED ORDER — LIDOCAINE 2% (20 MG/ML) 5 ML SYRINGE
INTRAMUSCULAR | Status: DC | PRN
  Administered 2018-09-13: 40 mg via INTRAVENOUS

## 2018-09-13 MED ORDER — LACTATED RINGERS IV SOLN: INTRAVENOUS | Status: DC

## 2018-09-13 MED ORDER — OXYCODONE-ACETAMINOPHEN 5-325 MG PO TABS: 1.0000 | ORAL_TABLET | ORAL | 0 refills | Status: AC | PRN

## 2018-09-13 MED ORDER — PROPOFOL 10 MG/ML IV BOLUS
INTRAVENOUS | Status: DC | PRN
  Administered 2018-09-13: 200 mg via INTRAVENOUS

## 2018-09-13 MED ORDER — SODIUM CHLORIDE 0.9 % IR SOLN
Status: DC | PRN
  Administered 2018-09-13: 1000 mL

## 2018-09-13 MED ORDER — CEFAZOLIN SODIUM-DEXTROSE 2-4 GM/100ML-% IV SOLN
2.0000 g | INTRAVENOUS | Status: AC
  Administered 2018-09-13: 2 g via INTRAVENOUS
  Filled 2018-09-13: qty 100

## 2018-09-13 MED ORDER — FENTANYL CITRATE (PF) 100 MCG/2ML IJ SOLN
INTRAMUSCULAR | Status: AC
  Filled 2018-09-13: qty 2

## 2018-09-13 SURGICAL SUPPLY — 47 items
BANDAGE ELASTIC 6 LF NS (GAUZE/BANDAGES/DRESSINGS) ×3 IMPLANT
BIT DRILL 2.0X128 (BIT) ×2 IMPLANT
BIT DRILL 2.0X128MM (BIT) ×1
BLADE AGGRESSIVE PLUS 4.0 (BLADE) ×3 IMPLANT
BLADE SURG SZ11 CARB STEEL (BLADE) ×3 IMPLANT
BNDG GAUZE ELAST 4 BULKY (GAUZE/BANDAGES/DRESSINGS) ×3 IMPLANT
CHLORAPREP W/TINT 26 (MISCELLANEOUS) ×3 IMPLANT
CLOTH BEACON ORANGE TIMEOUT ST (SAFETY) ×3 IMPLANT
COOLER CRYO IC GRAV AND TUBE (ORTHOPEDIC SUPPLIES) ×3 IMPLANT
COVER WAND RF STERILE (DRAPES) ×3 IMPLANT
CUFF CRYO KNEE18X23 MED (MISCELLANEOUS) ×3 IMPLANT
CUFF TOURN SGL QUICK 34 (TOURNIQUET CUFF) ×2
CUFF TRNQT CYL 34X4.125X (TOURNIQUET CUFF) ×1 IMPLANT
DECANTER SPIKE VIAL GLASS SM (MISCELLANEOUS) ×6 IMPLANT
GAUZE 4X4 16PLY RFD (DISPOSABLE) ×3 IMPLANT
GAUZE SPONGE 4X4 12PLY STRL (GAUZE/BANDAGES/DRESSINGS) ×3 IMPLANT
GAUZE SPONGE 4X4 16PLY XRAY LF (GAUZE/BANDAGES/DRESSINGS) ×3 IMPLANT
GAUZE XEROFORM 5X9 LF (GAUZE/BANDAGES/DRESSINGS) ×3 IMPLANT
GLOVE BIOGEL PI IND STRL 7.0 (GLOVE) ×2 IMPLANT
GLOVE BIOGEL PI INDICATOR 7.0 (GLOVE) ×4
GLOVE SKINSENSE NS SZ8.0 LF (GLOVE) ×2
GLOVE SKINSENSE STRL SZ8.0 LF (GLOVE) ×1 IMPLANT
GLOVE SS N UNI LF 8.5 STRL (GLOVE) ×3 IMPLANT
GOWN STRL REUS W/ TWL LRG LVL3 (GOWN DISPOSABLE) ×1 IMPLANT
GOWN STRL REUS W/TWL LRG LVL3 (GOWN DISPOSABLE) ×2
GOWN STRL REUS W/TWL XL LVL3 (GOWN DISPOSABLE) ×3 IMPLANT
IV NS IRRIG 3000ML ARTHROMATIC (IV SOLUTION) ×12 IMPLANT
KIT BLADEGUARD II DBL (SET/KITS/TRAYS/PACK) ×3 IMPLANT
KIT TURNOVER CYSTO (KITS) ×3 IMPLANT
MANIFOLD NEPTUNE II (INSTRUMENTS) ×3 IMPLANT
MARKER SKIN DUAL TIP RULER LAB (MISCELLANEOUS) ×3 IMPLANT
NEEDLE HYPO 18GX1.5 BLUNT FILL (NEEDLE) ×3 IMPLANT
NEEDLE HYPO 21X1.5 SAFETY (NEEDLE) ×3 IMPLANT
NEEDLE SPNL 18GX3.5 QUINCKE PK (NEEDLE) ×3 IMPLANT
NS IRRIG 1000ML POUR BTL (IV SOLUTION) ×3 IMPLANT
PACK ARTHRO LIMB DRAPE STRL (MISCELLANEOUS) ×3 IMPLANT
PAD ABD 5X9 TENDERSORB (GAUZE/BANDAGES/DRESSINGS) ×3 IMPLANT
PAD ARMBOARD 7.5X6 YLW CONV (MISCELLANEOUS) ×3 IMPLANT
PADDING CAST COTTON 6X4 STRL (CAST SUPPLIES) ×3 IMPLANT
SET ARTHROSCOPY INST (INSTRUMENTS) ×3 IMPLANT
SET BASIN LINEN APH (SET/KITS/TRAYS/PACK) ×3 IMPLANT
SUT ETHILON 3 0 FSL (SUTURE) ×3 IMPLANT
SYR 10ML LL (SYRINGE) ×3 IMPLANT
SYR 30ML LL (SYRINGE) ×3 IMPLANT
TUBE CONNECTING 12'X1/4 (SUCTIONS) ×2
TUBE CONNECTING 12X1/4 (SUCTIONS) ×4 IMPLANT
TUBING ARTHRO INFLOW-ONLY STRL (TUBING) ×3 IMPLANT

## 2018-09-13 NOTE — Discharge Instructions (Signed)
Remove the Cryo/Cuff when you are walking  Remove the dressing tomorrow, apply band-aids  Start knee exercises by bending the knee 253 times a day on the second day after surgery  Apply weight to the operative knee and leg as tolerated  You can take the oxycodone for pain for 5 days.  After the 5 days take ibuprofen 800 mg every 8 hours and Tylenol 500 mg every 6 hours as needed for pain.  General Anesthesia, Adult, Care After This sheet gives you information about how to care for yourself after your procedure. Your health care provider may also give you more specific instructions. If you have problems or questions, contact your health care provider. What can I expect after the procedure? After the procedure, the following side effects are common:  Pain or discomfort at the IV site.  Nausea.  Vomiting.  Sore throat.  Trouble concentrating.  Feeling cold or chills.  Weak or tired.  Sleepiness and fatigue.  Soreness and body aches. These side effects can affect parts of the body that were not involved in surgery. Follow these instructions at home:  For at least 24 hours after the procedure:  Have a responsible adult stay with you. It is important to have someone help care for you until you are awake and alert.  Rest as needed.  Do not: ? Participate in activities in which you could fall or become injured. ? Drive. ? Use heavy machinery. ? Drink alcohol. ? Take sleeping pills or medicines that cause drowsiness. ? Make important decisions or sign legal documents. ? Take care of children on your own. Eating and drinking  Follow any instructions from your health care provider about eating or drinking restrictions.  When you feel hungry, start by eating small amounts of foods that are soft and easy to digest (bland), such as toast. Gradually return to your regular diet.  Drink enough fluid to keep your urine pale yellow.  If you vomit, rehydrate by drinking water,  juice, or clear broth. General instructions  If you have sleep apnea, surgery and certain medicines can increase your risk for breathing problems. Follow instructions from your health care provider about wearing your sleep device: ? Anytime you are sleeping, including during daytime naps. ? While taking prescription pain medicines, sleeping medicines, or medicines that make you drowsy.  Return to your normal activities as told by your health care provider. Ask your health care provider what activities are safe for you.  Take over-the-counter and prescription medicines only as told by your health care provider.  If you smoke, do not smoke without supervision.  Keep all follow-up visits as told by your health care provider. This is important. Contact a health care provider if:  You have nausea or vomiting that does not get better with medicine.  You cannot eat or drink without vomiting.  You have pain that does not get better with medicine.  You are unable to pass urine.  You develop a skin rash.  You have a fever.  You have redness around your IV site that gets worse. Get help right away if:  You have difficulty breathing.  You have chest pain.  You have blood in your urine or stool, or you vomit blood. Summary  After the procedure, it is common to have a sore throat or nausea. It is also common to feel tired.  Have a responsible adult stay with you for the first 24 hours after general anesthesia. It is important to have  someone help care for you until you are awake and alert.  When you feel hungry, start by eating small amounts of foods that are soft and easy to digest (bland), such as toast. Gradually return to your regular diet.  Drink enough fluid to keep your urine pale yellow.  Return to your normal activities as told by your health care provider. Ask your health care provider what activities are safe for you. This information is not intended to replace advice  given to you by your health care provider. Make sure you discuss any questions you have with your health care provider. Document Released: 06/27/2000 Document Revised: 11/04/2016 Document Reviewed: 11/04/2016 Elsevier Interactive Patient Education  2019 [employer].

## 2018-09-13 NOTE — Anesthesia Procedure Notes (Signed)
Procedure Name: LMA Insertion Date/Time: 09/13/2018 10:51 AM Performed by: Leva Baine A, CRNA Pre-anesthesia Checklist: Patient identified, Emergency Drugs available, Suction available, Patient being monitored and Timeout performed Patient Re-evaluated:Patient Re-evaluated prior to induction Oxygen Delivery Method: Circle system utilized Preoxygenation: Pre-oxygenation with 100% oxygen Induction Type: IV induction LMA Size: 4.0 Number of attempts: 1 Placement Confirmation: positive ETCO2 and breath sounds checked- equal and bilateral Tube secured with: Tape Dental Injury: Teeth and Oropharynx as per pre-operative assessment

## 2018-09-13 NOTE — Transfer of Care (Signed)
Immediate Anesthesia Transfer of Care Note  Patient: [name]  Procedure(s) Performed: KNEE ARTHROSCOPY WITH MEDIAL MENISCECTOMY AND MICROFRACTURE (Right )  Patient Location: PACU  Anesthesia Type:General  Level of Consciousness: awake and patient cooperative  Airway & Oxygen Therapy: Patient Spontanous Breathing and Patient connected to face mask oxygen  Post-op Assessment: Report given to RN, Post -op Vital signs reviewed and stable and Patient moving all extremities  Post vital signs: Reviewed and stable  Last Vitals:  Vitals Value Taken Time  BP 111/80 09/13/18 1156  Temp    Pulse 89 09/13/18 1159  Resp 9 09/13/18 1159  SpO2 96 % 09/13/18 1159  Vitals shown include unvalidated device data.  Last Pain:  Vitals:   09/13/18 0959  TempSrc: Oral  PainSc: 0-No pain         Complications: No apparent anesthesia complications

## 2018-09-13 NOTE — Anesthesia Postprocedure Evaluation (Signed)
Anesthesia Post Note  Patient: [name]  Procedure(s) Performed: KNEE ARTHROSCOPY WITH MEDIAL MENISCECTOMY AND MICROFRACTURE (Right )  Patient location during evaluation: PACU Anesthesia Type: General Level of consciousness: awake and patient cooperative Pain management: pain level controlled Vital Signs Assessment: post-procedure vital signs reviewed and stable Respiratory status: spontaneous breathing, respiratory function stable and nonlabored ventilation Cardiovascular status: blood pressure returned to baseline Postop Assessment: no apparent nausea or vomiting Anesthetic complications: no     Last Vitals:  Vitals:   09/13/18 1215 09/13/18 1230  BP: 124/81 124/76  Pulse: 82 75  Resp: (!) 9 10  Temp:    SpO2: 100% 94%    Last Pain:  Vitals:   09/13/18 1230  TempSrc:   PainSc: 7                  [name]

## 2018-09-13 NOTE — Brief Op Note (Signed)
09/13/2018  11:51 AM  PATIENT:  [name]  48 y.o. female  PRE-OPERATIVE DIAGNOSIS:  torn medial meniscus right knee  POST-OPERATIVE DIAGNOSIS:  Medial meniscus tear, chondral lesion medial femoral condyle  PROCEDURE:  Procedure(s): KNEE ARTHROSCOPY WITH MEDIAL MENISCECTOMY AND MICROFRACTURE (Right)   FINDINGS: Small tear posterior horn root medial meniscus Diffuse chondral malacia medial femoral condyle with 3 x 3 mm chondral lesion weightbearing surface lateral portion of medial femoral condyle ACL PCL normal Lateral compartment normal Patellofemoral joint mild chondromalacia trochlea grade 1  SURGEON:  Surgeon(s) and Role:    Carole Civil, MD - Primary  PHYSICIAN ASSISTANT:   ASSISTANTS: none   ANESTHESIA:   general EBL:  none   BLOOD ADMINISTERED:none  DRAINS: none   LOCAL MEDICATIONS USED:  MARCAINE 0.5% with epi 60 cc    SPECIMEN:  No Specimen  DISPOSITION OF SPECIMEN:  N/A  COUNTS:  YES  TOURNIQUET:   See anesthesia record   DICTATION: .Dragon Dictation  PLAN OF CARE: Discharge to home after PACU  PATIENT DISPOSITION:  PACU - hemodynamically stable.   Delay start of Pharmacological VTE agent (>24hrs) due to surgical blood loss or risk of bleeding: not applicable

## 2018-09-13 NOTE — Anesthesia Preprocedure Evaluation (Signed)
Anesthesia Evaluation  Patient identified by MRN, date of birth, ID band Patient awake    Reviewed: Allergy & Precautions, NPO status , Patient's Chart, lab work & pertinent test results  Airway Mallampati: II  TM Distance: >3 FB Neck ROM: Full    Dental no notable dental hx. (+) Teeth Intact   Pulmonary sleep apnea , former smoker,  Reports unable to tolerate CPAP   Pulmonary exam normal breath sounds clear to auscultation       Cardiovascular Exercise Tolerance: Good hypertension, Pt. on medications negative cardio ROS Normal cardiovascular examI Rhythm:Regular Rate:Normal  Denies any cardiac issues Cp or DOE Other than HTN on meds   Neuro/Psych  Headaches, Seizures -, Well Controlled,  PSYCHIATRIC DISORDERS Anxiety Sz history on meds - reprots last Sz 2018    GI/Hepatic Neg liver ROS, GERD  Medicated and Controlled,Denies GERD Sx today   Endo/Other  negative endocrine ROS  Renal/GU negative Renal ROSReports OAB on meds   negative genitourinary   Musculoskeletal  (+) Arthritis , Osteoarthritis,    Abdominal   Peds negative pediatric ROS (+)  Hematology negative hematology ROS (+)   Anesthesia Other Findings   Reproductive/Obstetrics negative OB ROS                             Anesthesia Physical Anesthesia Plan  ASA: II  Anesthesia Plan: General   Post-op Pain Management:    Induction: Intravenous  PONV Risk Score and Plan:   Airway Management Planned: LMA and Oral ETT  Additional Equipment:   Intra-op Plan:   Post-operative Plan: Extubation in OR  Informed Consent: I have reviewed the patients History and Physical, chart, labs and discussed the procedure including the risks, benefits and alternatives for the proposed anesthesia with the patient or authorized representative who has indicated his/her understanding and acceptance.     Dental advisory given  Plan  Discussed with: CRNA  Anesthesia Plan Comments: (Plan full PPE use  Plan GA(LMA) vs GETA d/w pt -WTP with same )        Anesthesia Quick Evaluation

## 2018-09-13 NOTE — Interval H&P Note (Signed)
History and Physical Interval Note:  09/13/2018 10:31 AM  [name]  has presented today for surgery, with the diagnosis of torn medial meniscus right knee.  The various methods of treatment have been discussed with the patient and family. After consideration of risks, benefits and other options for treatment, the patient has consented to  Procedure(s) with comments: KNEE ARTHROSCOPY WITH MEDIAL MENISCECTOMY (Right) - pt knows to arrive at 9:00 as a surgical intervention.  The patient's history has been reviewed, patient examined, no change in status, stable for surgery.  I have reviewed the patient's chart and labs.  Questions were answered to the patient's satisfaction.     Arther Abbott

## 2018-09-13 NOTE — Op Note (Addendum)
09/13/2018  11:51 AM  PATIENT:  [name]  48 y.o. female  PRE-OPERATIVE DIAGNOSIS:  torn medial meniscus right knee  POST-OPERATIVE DIAGNOSIS:  Medial meniscus tear, chondral lesion medial femoral condyle  PROCEDURE:  Procedure(s): KNEE ARTHROSCOPY WITH MEDIAL MENISCECTOMY AND MICROFRACTURE (Right) - 29881 Note the primary portion of the case was the medial meniscectomy and will be used as the coding portion  FINDINGS: Small tear posterior horn root medial meniscus Diffuse chondral malacia medial femoral condyle with 3 x 3 mm chondral lesion weightbearing surface lateral portion of medial femoral condyle ACL PCL normal Lateral compartment normal Patellofemoral joint mild chondromalacia trochlea grade 1  SURGEON:  Surgeon(s) and Role:    Carole Civil, MD - Primary  Surgery was done as follows  The patient was seen in preop the right knee was confirmed as the surgical site chart review was completed and the surgical site was marked right knee  Patient was taken to the operating room for general anesthesia and appropriate antibiotics which were started.  She was placed in the supine position in an arthroscopic leg holder was placed on the right leg.  The left leg was placed in a padded well leg holder.  After sterile prep and drape timeout was completed  Lateral portal was made and the scope was placed into the joint.  Spinal needle was used to assist making medial portal and the probe was placed in the joint.  Diagnostic arthroscopy was performed the findings are listed above.  There was a small tear of the posterior horn of the medial meniscus at the root.  This was debrided.  Stable rim was confirmed with the probe.  There was a 3 x 3 chondral lesion of the medial femoral condyle this was debrided until a stable rim remained and then a 2.0 drill bit was used to place 2 holes in the condyle until bleeding bone was observed.  The knee was then irrigated and closed  with 3-0 nylon sutures in interrupted fashion.  We applied a sterile dressing Ace bandage and Cryo/Cuff which was activated  The patient was extubated and taken to recovery room in stable condition  PHYSICIAN ASSISTANT:   ASSISTANTS: none   ANESTHESIA:   general EBL:  none   BLOOD ADMINISTERED:none  DRAINS: none   LOCAL MEDICATIONS USED:  MARCAINE 0.5% with epi 60 cc    SPECIMEN:  No Specimen  DISPOSITION OF SPECIMEN:  N/A  COUNTS:  YES  TOURNIQUET:   See anesthesia record   DICTATION: .Dragon Dictation  PLAN OF CARE: Discharge to home after PACU  PATIENT DISPOSITION:  PACU - hemodynamically stable.   Delay start of Pharmacological VTE agent (>24hrs) due to surgical blood loss or risk of bleeding: not applicable

## 2018-09-14 ENCOUNTER — Telehealth: Payer: Self-pay | Admitting: Radiology

## 2018-09-14 ENCOUNTER — Encounter (HOSPITAL_COMMUNITY): Payer: Self-pay | Admitting: Orthopedic Surgery

## 2018-09-14 NOTE — Telephone Encounter (Signed)
She has bled through bandage. I have told her to take down the bandage, make sure the sutures are intact, and I will call her right back.   [phone] I called back, sutures intact, I have advised per Dr Aline Brochure to rewrap with gauze (or maxipads if she does not have gauze) reapply the ace bandage and keep ice on it. Bleeding should subside over the next day or so. Call me back if she has any problems. She voiced understanding.l

## 2018-09-17 NOTE — Addendum Note (Signed)
Addendum  created 09/17/18 0737 by Mickel Baas, CRNA   Charge Capture section accepted

## 2018-09-18 DIAGNOSIS — Z9889 Other specified postprocedural states: Secondary | ICD-10-CM | POA: Insufficient documentation

## 2018-09-19 ENCOUNTER — Ambulatory Visit (INDEPENDENT_AMBULATORY_CARE_PROVIDER_SITE_OTHER): Payer: Medicare Other | Admitting: Orthopedic Surgery

## 2018-09-19 ENCOUNTER — Other Ambulatory Visit: Payer: Self-pay

## 2018-09-19 ENCOUNTER — Encounter: Payer: Self-pay | Admitting: Orthopedic Surgery

## 2018-09-19 DIAGNOSIS — Z9889 Other specified postprocedural states: Secondary | ICD-10-CM

## 2018-09-19 NOTE — Patient Instructions (Signed)
30 min every 2 hrs   Exercises 2 x a day

## 2018-09-19 NOTE — Progress Notes (Signed)
Chief Complaint  Patient presents with  . Routine Post Op    right knee arthroscopy 09/13/18   Microfracture medial meniscectomy right knee  Doing well mild pain some swelling  Knee flexes 90 degrees portal sites are clean  Expected joint effusion mild to moderate  Start home exercises, weight-bear as tolerated, ice 20 to 30 minutes every 2 hours  Return 4 weeks  Encounter Diagnosis  Name Primary?  . S/P right knee arthroscopy 09/13/18

## 2018-09-24 ENCOUNTER — Telehealth: Payer: Self-pay | Admitting: Obstetrics and Gynecology

## 2018-09-24 NOTE — Telephone Encounter (Signed)
Left message with pt reminding her that her appt on Wednesday is a tele visit.

## 2018-09-26 ENCOUNTER — Encounter: Payer: Self-pay | Admitting: Obstetrics and Gynecology

## 2018-09-26 ENCOUNTER — Other Ambulatory Visit: Payer: Self-pay

## 2018-09-26 ENCOUNTER — Ambulatory Visit (INDEPENDENT_AMBULATORY_CARE_PROVIDER_SITE_OTHER): Payer: Medicare Other | Admitting: Obstetrics and Gynecology

## 2018-09-26 VITALS — Ht 65.0 in | Wt 170.0 lb

## 2018-09-26 DIAGNOSIS — Z780 Asymptomatic menopausal state: Secondary | ICD-10-CM | POA: Diagnosis not present

## 2018-09-26 MED ORDER — PROGESTERONE MICRONIZED 100 MG PO CAPS: 100.0000 mg | ORAL_CAPSULE | Freq: Every day | ORAL | 3 refills | Status: DC

## 2018-09-26 MED ORDER — ESTRADIOL 0.1 MG/24HR TD PTTW: 1.0000 | MEDICATED_PATCH | TRANSDERMAL | 12 refills | Status: DC

## 2018-09-26 NOTE — Progress Notes (Addendum)
Patient ID: [name], female   DOB: 31-May-1970, 48 y.o.   MRN: [id number]    TELEHEALTH VIRTUAL GYNECOLOGY VISIT ENCOUNTER NOTE  I connected with [name] on 09/26/2018 at  9:45 AM EDT by telephone at home and verified that I am speaking with the correct person using two identifiers.   I discussed the limitations, risks, security and privacy concerns of performing an evaluation and management service by telephone and the availability of in person appointments. I also discussed with the patient that there may be a patient responsible charge related to this service. The patient expressed understanding and agreed to proceed.   History:  [name] is a 48 y.o. G29P0010 female being evaluated today for menopausal symptoms.  Recent labs showed Medina markedly elevated at 90, normal thyroid function test and low estradiol levels She is status post recent knee surgery and is fully recovered. She recently had knee surgery (09/13/2018) and is unsure if she will need more surgery in the future. She denies any abnormal vaginal discharge, bleeding, pelvic pain or other concerns. She has no history of DVT or clot issues, and none recalled in her family .       Past Medical History:  Diagnosis Date  . Anxiety   . Arthritis   . Balance problems   . Chronic abdominal pain   . Chronic knee pain   . Constipation   . Difficulty walking   . GERD (gastroesophageal reflux disease)   . Headache   . HTN (hypertension)   . Low back pain   . Panic attacks   . Polyuria   . Seizures ([place])    unknown etiology; on dilantin; last seizures was a few years ago.  . Sleep apnea    Past Surgical History:  Procedure Laterality Date  . BACK SURGERY     complicated by ureteral injury  . CHONDROPLASTY Right 08/31/2016   Procedure: CHONDROPLASTY RIGHT FEMUR;  Surgeon: Carole Civil, MD;  Location: [facility];  Service: Orthopedics;  Laterality: Right;  . ESOPHAGOGASTRODUODENOSCOPY N/A 09/11/2015    Procedure: ESOPHAGOGASTRODUODENOSCOPY (EGD);  Surgeon: Daneil Dolin, MD;  Location: [facility];  Service: Endoscopy;  Laterality: N/A;  0730  . ESOPHAGOGASTRODUODENOSCOPY (EGD) WITH ESOPHAGEAL DILATION N/A 03/20/2013   Dr. Rourk:Normal EGD-status post passage of a Maloney dilator/Status post esophageal biopsy., benign path  . KNEE ARTHROSCOPY WITH MEDIAL MENISECTOMY Right 08/31/2016   Procedure: KNEE ARTHROSCOPY WITH MEDIAL MENISECTOMY;  Surgeon: Carole Civil, MD;  Location: [facility];  Service: Orthopedics;  Laterality: Right;  . KNEE ARTHROSCOPY WITH MEDIAL MENISECTOMY Right 09/13/2018   Procedure: KNEE ARTHROSCOPY WITH MEDIAL MENISCECTOMY AND MICROFRACTURE;  Surgeon: Carole Civil, MD;  Location: [facility];  Service: Orthopedics;  Laterality: Right;  . MALONEY DILATION N/A 09/11/2015   Procedure: Venia Minks DILATION;  Surgeon: Daneil Dolin, MD;  Location: [facility];  Service: Endoscopy;  Laterality: N/A;   The following portions of the patient's history were reviewed and updated as appropriate: allergies, current medications, past family history, past medical history, past social history, past surgical history and problem list.   Health Maintenance:  Normal pap and negative HRHPV on 05/14/2014.  Normal mammogram on 09/04/2017.   Review of Systems:  Pertinent items noted in HPI and remainder of comprehensive ROS otherwise negative.  Physical Exam:   General:  Alert, oriented and cooperative.   Mental Status: Normal mood and affect perceived. Normal judgment and thought content.  Physical exam deferred due to  nature of the encounter  Labs and Imaging Results for orders placed or performed in visit on 09/12/18 (from the past 675 hour(s))  Follicle stimulating hormone   Collection Time: 09/12/18  9:48 AM  Result Value Ref Range   FSH 90.5 mIU/mL  TSH   Collection Time: 09/12/18  9:48 AM  Result Value Ref Range   TSH 1.340 0.450 - 4.500 uIU/mL  Estradiol   Collection  Time: 09/12/18  9:48 AM  Result Value Ref Range   Estradiol 13.7 pg/mL   Mm 3d Screen Breast Bilateral  Result Date: 09/05/2018 CLINICAL DATA:  Screening. EXAM: DIGITAL SCREENING BILATERAL MAMMOGRAM WITH TOMO AND CAD COMPARISON:  Previous exam(s). ACR Breast Density Category c: The breast tissue is heterogeneously dense, which may obscure small masses. FINDINGS: There are no findings suspicious for malignancy. Images were processed with CAD. IMPRESSION: No mammographic evidence of malignancy. A result letter of this screening mammogram will be mailed directly to the patient. RECOMMENDATION: Screening mammogram in one year. (Code:SM-B-01Y) BI-RADS CATEGORY  1: Negative. Electronically Signed   By: Marin Olp M.D.   On: 09/05/2018 10:31      Assessment and Plan:         1) Postmenopausal 2) Rx Prometrium 100 mg p.o. daily every 3 months for 2 wks 3) Transdermal patches Vivelle-Dot 0.1 mg transdermal patch  4.  Patient made aware to discontinue all estrogen products for 2 weeks before any future surgery I discussed the assessment and treatment plan with the patient. The patient was provided an opportunity to ask questions and all were answered. The patient agreed with the plan and demonstrated an understanding of the instructions.   The patient was advised to call back or seek an in-person evaluation/go to the ED if the symptoms worsen or if the condition fails to improve as anticipated.  I provided 12 minutes of non-face-to-face time during this encounter.  By signing my name below, I, De Burrs, attest that this documentation has been prepared under the direction and in the presence of [name], MD. Electronically Signed: De Burrs, Medical Scribe. 09/26/18. 9:34 AM.  I personally performed the services described in this documentation, which was SCRIBED in my presence. The recorded information has been reviewed and considered accurate. It has been edited as necessary during  review. [name], MD

## 2018-10-02 ENCOUNTER — Telehealth: Payer: Self-pay | Admitting: Obstetrics and Gynecology

## 2018-10-02 DIAGNOSIS — I10 Essential (primary) hypertension: Secondary | ICD-10-CM | POA: Diagnosis not present

## 2018-10-02 DIAGNOSIS — G4733 Obstructive sleep apnea (adult) (pediatric): Secondary | ICD-10-CM | POA: Diagnosis not present

## 2018-10-02 NOTE — Telephone Encounter (Signed)
Has body aches in back and arms, did not have till started the patch, is using patch twice weekly, so take patch off now and call me tomorrow afternoon to see if still achy if so, will change patch to gel or po estrogen

## 2018-10-02 NOTE — Telephone Encounter (Signed)
Patient called, stated that Dr. Glo Herring had given her a patch, but she states it's making her ache, her back and arm.  She wants to see if he can send something else in.  [place]   [phone]

## 2018-10-03 ENCOUNTER — Telehealth: Payer: Self-pay | Admitting: Adult Health

## 2018-10-03 MED ORDER — ESTRADIOL 0.5 MG PO TABS: 0.5000 mg | ORAL_TABLET | Freq: Every day | ORAL | 1 refills | Status: DC

## 2018-10-03 NOTE — Telephone Encounter (Signed)
Patient called, stated that after she took the patches off the side effects went away.  [phone]

## 2018-10-03 NOTE — Telephone Encounter (Signed)
Pt says aches went away when left patch off, will try estrace 0.5 mg po

## 2018-10-17 ENCOUNTER — Other Ambulatory Visit: Payer: Self-pay

## 2018-10-17 ENCOUNTER — Ambulatory Visit (INDEPENDENT_AMBULATORY_CARE_PROVIDER_SITE_OTHER): Payer: Medicare Other | Admitting: Orthopedic Surgery

## 2018-10-17 ENCOUNTER — Encounter: Payer: Self-pay | Admitting: Orthopedic Surgery

## 2018-10-17 VITALS — Temp 97.6°F

## 2018-10-17 DIAGNOSIS — Z9889 Other specified postprocedural states: Secondary | ICD-10-CM

## 2018-10-17 NOTE — Patient Instructions (Signed)
Continue exercises at home   Gradual increase in walking   Fu 7 weeks

## 2018-10-17 NOTE — Progress Notes (Signed)
POSTOP VISIT  POD number 34-week #5 Chief Complaint  Patient presents with  . Routine Post Op    09/13/18 right knee scope     Status post meniscectomy microfracture  Patient doing well walking very well has excellent range of motion some mild soreness on the medial side of the knee over the microfracture site  Excellent quadriceps control  Recommend continue home exercises gradually increase activity follow-up in 7 weeks  09/13/2018  11:51 AM  PATIENT:  [name]  48 y.o. 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  . S/P right knee arthroscopy *with microfracture 09/13/18 Yes      Postoperative plan (Work, WB, No orders of the defined types were placed in this encounter. ,FU)

## 2018-11-14 DIAGNOSIS — M1711 Unilateral primary osteoarthritis, right knee: Secondary | ICD-10-CM | POA: Diagnosis not present

## 2018-11-14 DIAGNOSIS — M545 Low back pain: Secondary | ICD-10-CM | POA: Diagnosis not present

## 2018-11-14 DIAGNOSIS — I10 Essential (primary) hypertension: Secondary | ICD-10-CM | POA: Diagnosis not present

## 2018-12-03 ENCOUNTER — Other Ambulatory Visit: Payer: Self-pay | Admitting: Adult Health

## 2018-12-04 DIAGNOSIS — M545 Low back pain: Secondary | ICD-10-CM | POA: Diagnosis not present

## 2018-12-04 DIAGNOSIS — M1711 Unilateral primary osteoarthritis, right knee: Secondary | ICD-10-CM | POA: Diagnosis not present

## 2018-12-04 DIAGNOSIS — I10 Essential (primary) hypertension: Secondary | ICD-10-CM | POA: Diagnosis not present

## 2018-12-05 ENCOUNTER — Ambulatory Visit (INDEPENDENT_AMBULATORY_CARE_PROVIDER_SITE_OTHER): Payer: Medicare Other | Admitting: Orthopedic Surgery

## 2018-12-05 ENCOUNTER — Other Ambulatory Visit: Payer: Self-pay

## 2018-12-05 VITALS — BP 105/75 | HR 104 | Temp 97.1°F | Ht 66.0 in | Wt 165.0 lb

## 2018-12-05 DIAGNOSIS — Z9889 Other specified postprocedural states: Secondary | ICD-10-CM

## 2018-12-05 NOTE — Patient Instructions (Signed)
Rest the knee the next couple weeks  Take ibuprofen 800 mg twice a day use ice

## 2018-12-05 NOTE — Progress Notes (Signed)
POST OP   Chief Complaint  Patient presents with  . Follow-up    Recheck on right knee, DOS 09-13-18.   ALMOST 3 MONTHS POST OP :  Encounter Diagnosis  Name Primary?  . S/P right knee arthroscopy *with microfracture 09/13/18 Yes     09/13/2018  11:51 AM  PATIENT:  [name]  48 y.o. 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  She attributes it to walking while on vacation at the beach.  Complains of intermittent heat or hotness in the knee  Right knee has full range of motion no effusion she does have peripatellar tenderness and medial condylar tenderness  Recommend ice, ibuprofen 100 mg twice a day and a week or 2 of rest to see if it calms down

## 2018-12-13 ENCOUNTER — Other Ambulatory Visit: Payer: Self-pay

## 2018-12-13 NOTE — Patient Outreach (Signed)
[facility]) Care Management  12/13/2018  KYNDLE SCHLENDER 03/11/1971 [id number]   Medication Adherence call to [name] HIPPA Compliant Voice message left with a call back number. Mrs. Haugen is showing past due on Irbesartan 300 mg and Atorvastatin 20 mg under [place].   [place] Management Direct Dial [phone]  Fax [phone] [email or web address]

## 2019-01-03 ENCOUNTER — Other Ambulatory Visit: Payer: Self-pay

## 2019-01-03 NOTE — Patient Outreach (Signed)
[name] [facility]) Care Management  01/03/2019  [name] 03/13/1971 [id number]   Medication Adherence call to [name] HIPPA Compliant Voice message left with a call back number. [name] is showing past due on Atorvastatin 20 mg and Irbesatan 300 mg under [facility].   [place] Management Direct Dial [phone]  Fax [phone] [name]

## 2019-01-05 ENCOUNTER — Other Ambulatory Visit: Payer: Self-pay | Admitting: Women's Health

## 2019-01-05 ENCOUNTER — Other Ambulatory Visit: Payer: Self-pay | Admitting: Gastroenterology

## 2019-01-07 ENCOUNTER — Other Ambulatory Visit: Payer: Self-pay | Admitting: Women's Health

## 2019-01-08 ENCOUNTER — Other Ambulatory Visit: Payer: Self-pay | Admitting: *Deleted

## 2019-01-08 MED ORDER — ESTRADIOL 0.5 MG PO TABS: 0.5000 mg | ORAL_TABLET | Freq: Every day | ORAL | 3 refills | Status: DC

## 2019-01-08 NOTE — Telephone Encounter (Signed)
Pt needs new rx for estradiol sent to [facility].

## 2019-02-01 DIAGNOSIS — H5213 Myopia, bilateral: Secondary | ICD-10-CM | POA: Diagnosis not present

## 2019-02-11 ENCOUNTER — Other Ambulatory Visit: Payer: Self-pay | Admitting: Gastroenterology

## 2019-02-13 DIAGNOSIS — Z0000 Encounter for general adult medical examination without abnormal findings: Secondary | ICD-10-CM | POA: Diagnosis not present

## 2019-02-19 DIAGNOSIS — E785 Hyperlipidemia, unspecified: Secondary | ICD-10-CM | POA: Diagnosis not present

## 2019-02-19 DIAGNOSIS — R739 Hyperglycemia, unspecified: Secondary | ICD-10-CM | POA: Diagnosis not present

## 2019-02-19 DIAGNOSIS — D649 Anemia, unspecified: Secondary | ICD-10-CM | POA: Diagnosis not present

## 2019-02-19 DIAGNOSIS — N3281 Overactive bladder: Secondary | ICD-10-CM | POA: Diagnosis not present

## 2019-02-19 DIAGNOSIS — I10 Essential (primary) hypertension: Secondary | ICD-10-CM | POA: Diagnosis not present

## 2019-02-19 DIAGNOSIS — K5904 Chronic idiopathic constipation: Secondary | ICD-10-CM | POA: Diagnosis not present

## 2019-02-19 DIAGNOSIS — G4730 Sleep apnea, unspecified: Secondary | ICD-10-CM | POA: Diagnosis not present

## 2019-03-05 ENCOUNTER — Other Ambulatory Visit: Payer: Self-pay

## 2019-03-05 DIAGNOSIS — Z20822 Contact with and (suspected) exposure to covid-19: Secondary | ICD-10-CM

## 2019-03-07 ENCOUNTER — Telehealth: Payer: Self-pay | Admitting: *Deleted

## 2019-03-07 NOTE — Telephone Encounter (Signed)
Patient is calling to see if COVID test results are back- notified still pending. Assured patient0 she does have MyChart- and should get notified when they are resulted.

## 2019-03-08 LAB — NOVEL CORONAVIRUS, NAA: SARS-CoV-2, NAA: NOT DETECTED

## 2019-03-25 DIAGNOSIS — K219 Gastro-esophageal reflux disease without esophagitis: Secondary | ICD-10-CM | POA: Diagnosis not present

## 2019-03-25 DIAGNOSIS — Z0189 Encounter for other specified special examinations: Secondary | ICD-10-CM | POA: Diagnosis not present

## 2019-04-19 DIAGNOSIS — H5213 Myopia, bilateral: Secondary | ICD-10-CM | POA: Diagnosis not present

## 2019-04-19 DIAGNOSIS — H52223 Regular astigmatism, bilateral: Secondary | ICD-10-CM | POA: Diagnosis not present

## 2019-05-18 ENCOUNTER — Other Ambulatory Visit: Payer: Self-pay | Admitting: Obstetrics & Gynecology

## 2019-07-01 DIAGNOSIS — E039 Hypothyroidism, unspecified: Secondary | ICD-10-CM | POA: Diagnosis not present

## 2019-07-01 DIAGNOSIS — Z5181 Encounter for therapeutic drug level monitoring: Secondary | ICD-10-CM | POA: Diagnosis not present

## 2019-07-01 DIAGNOSIS — R7301 Impaired fasting glucose: Secondary | ICD-10-CM | POA: Diagnosis not present

## 2019-07-01 DIAGNOSIS — I10 Essential (primary) hypertension: Secondary | ICD-10-CM | POA: Diagnosis not present

## 2019-07-04 DIAGNOSIS — K5900 Constipation, unspecified: Secondary | ICD-10-CM | POA: Diagnosis not present

## 2019-07-04 DIAGNOSIS — I10 Essential (primary) hypertension: Secondary | ICD-10-CM | POA: Diagnosis not present

## 2019-07-04 DIAGNOSIS — K219 Gastro-esophageal reflux disease without esophagitis: Secondary | ICD-10-CM | POA: Diagnosis not present

## 2019-07-04 DIAGNOSIS — G8929 Other chronic pain: Secondary | ICD-10-CM | POA: Diagnosis not present

## 2019-07-04 DIAGNOSIS — E785 Hyperlipidemia, unspecified: Secondary | ICD-10-CM | POA: Diagnosis not present

## 2019-07-08 ENCOUNTER — Encounter: Payer: Self-pay | Admitting: Internal Medicine

## 2019-07-29 ENCOUNTER — Ambulatory Visit: Payer: Medicare Other | Admitting: Gastroenterology

## 2019-07-29 ENCOUNTER — Emergency Department (HOSPITAL_COMMUNITY)
Admission: EM | Admit: 2019-07-29 | Discharge: 2019-07-29 | Disposition: A | Discharge status: Home / Self Care | Payer: Medicare Other | Attending: Emergency Medicine | Admitting: Emergency Medicine

## 2019-07-29 ENCOUNTER — Emergency Department (HOSPITAL_COMMUNITY): Payer: Medicare Other

## 2019-07-29 ENCOUNTER — Encounter (HOSPITAL_COMMUNITY): Payer: Self-pay

## 2019-07-29 ENCOUNTER — Other Ambulatory Visit: Payer: Self-pay

## 2019-07-29 DIAGNOSIS — M25561 Pain in right knee: Secondary | ICD-10-CM | POA: Diagnosis not present

## 2019-07-29 DIAGNOSIS — W268XXA Contact with other sharp object(s), not elsewhere classified, initial encounter: Secondary | ICD-10-CM | POA: Diagnosis not present

## 2019-07-29 DIAGNOSIS — Z87891 Personal history of nicotine dependence: Secondary | ICD-10-CM | POA: Diagnosis not present

## 2019-07-29 DIAGNOSIS — S61209A Unspecified open wound of unspecified finger without damage to nail, initial encounter: Secondary | ICD-10-CM

## 2019-07-29 DIAGNOSIS — Y929 Unspecified place or not applicable: Secondary | ICD-10-CM | POA: Insufficient documentation

## 2019-07-29 DIAGNOSIS — R569 Unspecified convulsions: Secondary | ICD-10-CM | POA: Diagnosis not present

## 2019-07-29 DIAGNOSIS — Z79899 Other long term (current) drug therapy: Secondary | ICD-10-CM | POA: Diagnosis not present

## 2019-07-29 DIAGNOSIS — Y999 Unspecified external cause status: Secondary | ICD-10-CM | POA: Diagnosis not present

## 2019-07-29 DIAGNOSIS — Y939 Activity, unspecified: Secondary | ICD-10-CM | POA: Insufficient documentation

## 2019-07-29 DIAGNOSIS — I10 Essential (primary) hypertension: Secondary | ICD-10-CM | POA: Diagnosis not present

## 2019-07-29 DIAGNOSIS — S61211A Laceration without foreign body of left index finger without damage to nail, initial encounter: Secondary | ICD-10-CM | POA: Insufficient documentation

## 2019-07-29 LAB — CBC WITH DIFFERENTIAL/PLATELET
Abs Immature Granulocytes: 0.01 10*3/uL (ref 0.00–0.07)
Basophils Absolute: 0 10*3/uL (ref 0.0–0.1)
Basophils Relative: 0 %
Eosinophils Absolute: 0.1 10*3/uL (ref 0.0–0.5)
Eosinophils Relative: 2 %
HCT: 36.8 % (ref 36.0–46.0)
Hemoglobin: 12.3 g/dL (ref 12.0–15.0)
Immature Granulocytes: 0 %
Lymphocytes Relative: 25 %
Lymphs Abs: 1 10*3/uL (ref 0.7–4.0)
MCH: 31.1 pg (ref 26.0–34.0)
MCHC: 33.4 g/dL (ref 30.0–36.0)
MCV: 92.9 fL (ref 80.0–100.0)
Monocytes Absolute: 0.5 10*3/uL (ref 0.1–1.0)
Monocytes Relative: 12 %
Neutro Abs: 2.5 10*3/uL (ref 1.7–7.7)
Neutrophils Relative %: 61 %
Platelets: 200 10*3/uL (ref 150–400)
RBC: 3.96 MIL/uL (ref 3.87–5.11)
RDW: 13 % (ref 11.5–15.5)
WBC: 4.1 10*3/uL (ref 4.0–10.5)
nRBC: 0 % (ref 0.0–0.2)

## 2019-07-29 LAB — BASIC METABOLIC PANEL
Anion gap: 12 (ref 5–15)
BUN: 11 mg/dL (ref 6–20)
CO2: 26 mmol/L (ref 22–32)
Calcium: 9.1 mg/dL (ref 8.9–10.3)
Chloride: 101 mmol/L (ref 98–111)
Creatinine, Ser: 0.82 mg/dL (ref 0.44–1.00)
GFR calc Af Amer: >60 mL/min (ref >60)
GFR calc non Af Amer: >60 mL/min (ref >60)
Glucose, Bld: 100 mg/dL — ABNORMAL HIGH (ref 70–99)
Potassium: 3.8 mmol/L (ref 3.5–5.1)
Sodium: 139 mmol/L (ref 135–145)

## 2019-07-29 LAB — URINALYSIS, ROUTINE W REFLEX MICROSCOPIC
Bilirubin Urine: NEGATIVE
Glucose, UA: NEGATIVE mg/dL
Hgb urine dipstick: NEGATIVE
Ketones, ur: NEGATIVE mg/dL
Leukocytes,Ua: NEGATIVE
Nitrite: NEGATIVE
Protein, ur: NEGATIVE mg/dL
Specific Gravity, Urine: 1.014 (ref 1.005–1.030)
pH: 8 (ref 5.0–8.0)

## 2019-07-29 LAB — PREGNANCY, URINE: Preg Test, Ur: NEGATIVE

## 2019-07-29 LAB — PHENYTOIN LEVEL, TOTAL: Phenytoin Lvl: 9.9 ug/mL — ABNORMAL LOW (ref 10.0–20.0)

## 2019-07-29 LAB — CBG MONITORING, ED: Glucose-Capillary: 97 mg/dL (ref 70–99)

## 2019-07-29 MED ORDER — CEPHALEXIN 500 MG PO CAPS
500.0000 mg | ORAL_CAPSULE | Freq: Four times a day (QID) | ORAL | 0 refills | Status: DC
Start: 2019-07-29 — End: 2019-08-12

## 2019-07-29 MED ORDER — TETANUS-DIPHTH-ACELL PERTUSSIS 5-2.5-18.5 LF-MCG/0.5 IM SUSP
0.5000 mL | Freq: Once | INTRAMUSCULAR | Status: AC
  Administered 2019-07-29: 0.5 mL via INTRAMUSCULAR
  Filled 2019-07-29: qty 0.5

## 2019-07-29 NOTE — Discharge Instructions (Signed)
Please keep wound clean and dry. I have prescribed an antibiotic to prevent infection - take as prescribed  Continue taking all of your medications including your seizure medication Wear the knee brace for stability. You may take Ibuprofen and Tylenol as needed for pain Follow up with your PCP regarding your ED visit today. They can recheck your wound in 1-2 weeks time. Follow up with your orthopedist as well regarding your knee pain.  While at home please rest, ice, and elevate your knee to reduce pain/inflammation  Return to the ED for any worsening symptoms including persistent seizure or signs of infection around the wound itself including redness/swelling/drainage of pus or if you develop fevers > 100.4 or chills

## 2019-07-29 NOTE — ED Triage Notes (Signed)
Pt reports she was cutting fabric with a roller and cut left index finger which caused her to have a seizure and fall hurting right knee

## 2019-07-29 NOTE — ED Provider Notes (Signed)
[facility] Provider Note   CSN: [id number] Arrival date & time: 07/29/19  0165     History Chief Complaint  Patient presents with  . Laceration  . Knee Pain    [name] is a 49 y.o. female with PMHx anxiety, arthritis, GERD, HTN, seizures (currently on Dilantin) who presents to the ED with complaint of laceration to left index finger that occurred at midnight last night (approximately 8 hours ago).  Patient reports that she was using a fabric cutter when she accidentally rolled over her finger and cut it.  She reports her sister is a CNA and applied some Steri-Strips and bandage over the area.  Patient states that it has not bled much throughout the night.  She is unsure of her tetanus status and thinks it has been more than 10 years.  She does mention that after she cut her finger she either passed out or had a seizure.  She states that her friend who was with her saw her fall landed directly onto her right knee and then fall again straight onto the floor.  She reports that her friend told her that she had shaking although she is unsure if she had full body convulsions or just mild tremors.  She does state that her friend thought she seemed confused and tired for a couple of minutes afterwards.  Patient does have a history of seizure and is currently on Dilantin which she states she is compliant with.  She states she has not had a seizure in over a year.  She denies head injury.  Is not anticoagulated.  She states she feels improved currently as she went to bed after the incident last night.  She has been having some right knee pain since falling directly on it.  Reports she just had arthroscopy with medial meniscectomy in September of last year.  She is concerned that she could have damaged her knee in some way with the fall.  She has been able to ambulate without difficulty. Pt has no other complaints at this time.   The history is provided by the patient and  medical records.       Past Medical History:  Diagnosis Date  . Anxiety   . Arthritis   . Balance problems   . Chronic abdominal pain   . Chronic knee pain   . Constipation   . Difficulty walking   . GERD (gastroesophageal reflux disease)   . Headache   . HTN (hypertension)   . Low back pain   . Panic attacks   . Polyuria   . Seizures ([place])    unknown etiology; on dilantin; last seizures was a few years ago.  . Sleep apnea     Patient Active Problem List   Diagnosis Date Noted  . S/P right knee arthroscopy *with microfracture 09/13/18 09/18/2018  . Acute medial meniscus tear of right knee   . 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 Primary osteoarthritis of right knee   . Gastric polyp   . GERD (gastroesophageal reflux disease) 08/24/2015  . Hypoactive sexual desire disorder 05/14/2014  . Abdominal pain, chronic, epigastric 12/08/2013  . Constipation 12/08/2013  . Esophageal dysphagia 02/22/2013  . Abdominal pain, epigastric 02/22/2013  . LUQ pain 02/22/2013  . Difficulty in walking(719.7) 01/25/2012  . Stiffness of joint, not elsewhere classified, ankle and foot 01/25/2012  . Balance problems  01/25/2012  . Ankle sprain 01/17/2012  . Ankle pain, right 01/17/2012    Past Surgical History:  Procedure Laterality Date  . BACK SURGERY     complicated by ureteral injury  . CHONDROPLASTY Right 08/31/2016   Procedure: CHONDROPLASTY RIGHT FEMUR;  Surgeon: Carole Civil, MD;  Location: [facility];  Service: Orthopedics;  Laterality: Right;  . ESOPHAGOGASTRODUODENOSCOPY N/A 09/11/2015   Procedure: ESOPHAGOGASTRODUODENOSCOPY (EGD);  Surgeon: Daneil Dolin, MD;  Location: [facility];  Service: Endoscopy;  Laterality: N/A;  0730  . ESOPHAGOGASTRODUODENOSCOPY (EGD) WITH ESOPHAGEAL DILATION N/A 03/20/2013   Dr. Rourk:Normal EGD-status post passage of a Maloney dilator/Status post esophageal biopsy.,  benign path  . KNEE ARTHROSCOPY WITH MEDIAL MENISECTOMY Right 08/31/2016   Procedure: KNEE ARTHROSCOPY WITH MEDIAL MENISECTOMY;  Surgeon: Carole Civil, MD;  Location: [facility];  Service: Orthopedics;  Laterality: Right;  . KNEE ARTHROSCOPY WITH MEDIAL MENISECTOMY Right 09/13/2018   Procedure: KNEE ARTHROSCOPY WITH MEDIAL MENISCECTOMY AND MICROFRACTURE;  Surgeon: Carole Civil, MD;  Location: [facility];  Service: Orthopedics;  Laterality: Right;  . MALONEY DILATION N/A 09/11/2015   Procedure: Venia Minks DILATION;  Surgeon: Daneil Dolin, MD;  Location: [facility];  Service: Endoscopy;  Laterality: N/A;     OB History    Gravida  1   Para      Term      Preterm      AB  1   Living        SAB  1   TAB      Ectopic      Multiple      Live Births              Family History  Problem Relation Age of Onset  . Throat cancer Father   . Heart disease Father   . Cancer Father   . Hypertension Father   . Diabetes Father   . Other Mother        esophageal stricture and gallbladder disease  . Thyroid disease Mother   . Other Sister        esophageal stricture and gallbladder problems.   . Diabetes Sister   . Hypertension Sister   . Thyroid disease Sister   . Heart disease Maternal Grandfather   . Diabetes Maternal Grandfather   . Arthritis Maternal Grandfather   . Colon cancer Neg Hx   . Liver disease Neg Hx   . Pancreatic cancer Neg Hx     Social History   Tobacco Use  . Smoking status: Former Smoker    Packs/day: 2.00    Years: 9.00    Pack years: 18.00    Types: Cigarettes    Quit date: 04/04/1994    Years since quitting: 25.3  . Smokeless tobacco: Never Used  . Tobacco comment: smoked 8 years, quit 18 years ago in the 1996  Substance Use Topics  . Alcohol use: No    Alcohol/week: 0.0 standard drinks  . Drug use: No    Home Medications Prior to Admission medications   Medication Sig Start Date End Date Taking? Authorizing Provider  ALPRAZolam  Duanne Moron) 0.5 MG tablet Take 0.5 mg by mouth 2 (two) times daily as needed for anxiety.  04/30/14   [provider]  amLODipine (NORVASC) 10 MG tablet Take 10 mg by mouth daily.    [provider]  atorvastatin (LIPITOR) 20 MG tablet Take 20 mg by mouth at bedtime.  11/29/17   [provider]  cephALEXin (KEFLEX) 500 MG capsule Take 1 capsule (500 mg total) by mouth 4 (four) times daily. 07/29/19   Eustaquio Maize, PA-C  DULoxetine (CYMBALTA) 20 MG capsule Take 20 mg by mouth daily.    [provider]  escitalopram (LEXAPRO) 10 MG tablet Take 10 mg by mouth daily. 06/05/18   [provider]  estradiol (ESTRACE) 0.5 MG tablet TAKE 1 TABLET BY MOUTH DAILY 05/20/19   Florian Buff, MD  irbesartan (AVAPRO) 300 MG tablet Take 300 mg by mouth daily. 07/03/14   [provider]  LINZESS 290 MCG CAPS capsule TAKE (1) CAPSULE BY MOUTH ONCE DAILY BEFORE BREAKFAST. 02/12/19   Erenest Rasher, PA-C  MYRBETRIQ 25 MG TB24 tablet Take 25 mg by mouth daily.  10/20/15   [provider]  Omega-3 Fatty Acids (FISH OIL) 1000 MG CAPS Take 1,000 mg by mouth daily.    [provider]  pantoprazole (PROTONIX) 40 MG tablet TAKE 1 TABLET TWICE DAILY BEFORE A MEAL. 01/09/19   Carlis Stable, NP  phenytoin (DILANTIN) 100 MG ER capsule Take 100-200 mg by mouth See admin instructions. 249m in the morning and 1071mat bedtime    [provider]  progesterone (PROMETRIUM) 100 MG capsule Take 1 capsule (100 mg total) by mouth daily. For 14 days every 3 months. 09/26/18   FeJonnie KindMD    Allergies    Benadryl [diphenhydramine hcl]  Review of Systems   Review of Systems  Constitutional: Negative for chills and fever.  Musculoskeletal: Positive for arthralgias.  Skin: Positive for wound.  Neurological: Positive for seizures.  All other systems reviewed and are negative.   Physical Exam Updated Vital Signs BP (!) 134/93 (BP Location: Left Arm)    Pulse 98   Temp 99.1 F (37.3 C)   Resp 16   Ht 5' 6"  (1.676 m)   Wt 78 kg   LMP 07/06/2018   SpO2 94%   BMI 27.76 kg/m   Physical Exam Vitals and nursing note reviewed.  Constitutional:      Appearance: She is not ill-appearing or diaphoretic.  HENT:     Head: Normocephalic and atraumatic.     Right Ear: Tympanic membrane normal.     Left Ear: Tympanic membrane normal.  Eyes:     Extraocular Movements: Extraocular movements intact.     Conjunctiva/sclera: Conjunctivae normal.     Pupils: Pupils are equal, round, and reactive to light.  Cardiovascular:     Rate and Rhythm: Normal rate and regular rhythm.     Pulses: Normal pulses.  Pulmonary:     Effort: Pulmonary effort is normal.     Breath sounds: Normal breath sounds. No wheezing, rhonchi or rales.  Abdominal:     Palpations: Abdomen is soft.     Tenderness: There is no abdominal tenderness.  Musculoskeletal:     Cervical back: Neck supple.     Right lower leg: No edema.     Left lower leg: No edema.     Comments: No obvious edema noted to R knee compared to L. No overlying skin changes including erythema, ecchymosis, increased warmth. + TTP to the lateral aspect inferior pole of patella. ROM intact to knee. Strength and sensation intact throughout. Negative anterior and posterior drawer test. No varus or valgus laxity. 2+ DP pulse.   Skin avulsion noted to left index finger with nail involvement. No bony deformity appreciated. ROM intact to MCP, PIP, and DIP joint of same finger.  Cap refill < 2 seconds. Bleeding controlled. 2+ radial pulse.   Skin:    General: Skin is warm and dry.  Neurological:     Mental Status: She is alert.     Comments: CN 3-12 grossly intact A&O x4 GCS 15 Sensation and strength intact Gait nonataxic including with tandem walking Coordination with finger-to-nose WNL Neg romberg, neg pronator drift     ED Results / Procedures / Treatments   Labs (all labs ordered are listed, but only  abnormal results are displayed) Labs Reviewed  BASIC METABOLIC PANEL - Abnormal; Notable for the following components:      Result Value   Glucose, Bld 100 (*)    All other components within normal limits  PHENYTOIN LEVEL, TOTAL - Abnormal; Notable for the following components:   Phenytoin Lvl 9.9 (*)    All other components within normal limits  URINALYSIS, ROUTINE W REFLEX MICROSCOPIC - Abnormal; Notable for the following components:   APPearance CLOUDY (*)    All other components within normal limits  CBC WITH DIFFERENTIAL/PLATELET  PREGNANCY, URINE  CBG MONITORING, ED    EKG EKG Interpretation  Date/Time:  Monday July 29 2019 08:22:08 EDT Ventricular Rate:  92 PR Interval:    QRS Duration: 90 QT Interval:  379 QTC Calculation: 469 R Axis:   109 Text Interpretation: Sinus rhythm Right axis deviation Low voltage, precordial leads Borderline T abnormalities, anterior leads No significant change since last tracing Confirmed by Fredia Sorrow [phone]) on 07/29/2019 8:26:59 AM   Radiology DG Knee Complete 4 Views Right  Result Date: 07/29/2019 CLINICAL DATA:  Syncope, right knee pain anteriorly EXAM: RIGHT KNEE - COMPLETE 4+ VIEW COMPARISON:  06/15/2018 FINDINGS: Frontal, bilateral oblique, lateral views of the right knee demonstrate no fractures. Alignment is anatomic. Mild medial and patellofemoral compartmental osteoarthritis. No joint effusion. IMPRESSION: 1. Stable osteoarthritis.  No acute fracture. Electronically Signed   By: Randa Ngo M.D.   On: 07/29/2019 08:25   DG Finger Index Left  Result Date: 07/29/2019 CLINICAL DATA:  Laceration distal left second digit EXAM: LEFT INDEX FINGER 2+V COMPARISON:  None. FINDINGS: Frontal, oblique, lateral views of the left second digit demonstrates soft tissue defect radial aspect tuft of second digit. No underlying fracture. No radiopaque foreign bodies. IMPRESSION: 1. Soft tissue injury, no acute bony abnormality. Electronically  Signed   By: Randa Ngo M.D.   On: 07/29/2019 08:24    Procedures .Marland KitchenLaceration Repair  Date/Time: 07/29/2019 9:11 AM Performed by: Eustaquio Maize, PA-C Authorized by: Eustaquio Maize, PA-C   Consent:    Consent obtained:  Verbal   Consent given by:  Patient Anesthesia (see MAR for exact dosages):    Anesthesia method:  None Laceration details:    Location:  Finger   Finger location:  L index finger Repair type:    Repair type:  Simple Exploration:    Hemostasis obtained with: surgicel. Post-procedure details:    Dressing:  Non-adherent dressing   Patient tolerance of procedure:  Tolerated well, no immediate complications   (including critical care time)  Medications Ordered in ED Medications  Tdap (BOOSTRIX) injection 0.5 mL (0.5 mLs Intramuscular Given 07/29/19 4034)    ED Course  I have reviewed the triage vital signs and the nursing notes.  Pertinent labs & imaging results that were available during my care of the patient were reviewed by me and considered in my medical decision making (see chart for details).    MDM Rules/Calculators/A&P  49 year old female presents the ED initially complaining of index finger laceration that occurred approximately 8 hours prior to arrival, tetanus not up-to-date.  While she sustained a laceration she either had a syncopal episode versus seizure-like activity.  She landed directly onto her right knee causing worsening pain status post arthroscopy with medial meniscectomy in September 2020.  She does have history of seizures and is currently on Dilantin with compliance noted.  She reports that her friend told her that she had shaking, as well as a postictal state.  She went to bed afterwards and is back to baseline now.  She reports it was brought on by the blood loss which makes me more suspicious for vasovagal response however given history of seizures will obtain screening labs at this time as well as EKG and  phenytoin level.  Pt without any noted focal neuro deficits on exam. No oral trauma noted. Pt denies head injury and is not anticoagulated. Will obtain x-ray of the left index finger as well as the right knee.  Will update tetanus.   CBC without leukocytosis. hgb stable.  BMP without electrolyte abnormalities CBG within normal limits at 97 Phenytoin level 9.9  Xrays both negative for bony abnormalities.  Surigcel applied to wound for hemostasis and then nonadherent dressing placed with coban.   Urine preg negative U/A without signs of infection   Labwork ultimately reassuring today. Given pt is back at baseline feel she is stable for discharge at this time. Have advised that she keep her wound clean and dry. Encouraged to continue taking antiepileptic medication and to follow up with PCP regarding ED visit today. Will discharge home with keflex to prevent infection to finger. Strict return precautions discussed including redness/swelling/drainge of pus around the wound itself or if pt develops fevers/chills. Pt is in agreement with plan and stable for discharge home.   This note was prepared using Dragon voice recognition software and may include unintentional dictation errors due to the inherent limitations of voice recognition software.  Final Clinical Impression(s) / ED Diagnoses Final diagnoses:  Avulsion of skin of finger without complication, initial encounter  Acute pain of right knee  Seizure ([place])    Rx / DC Orders ED Discharge Orders         Ordered    cephALEXin (KEFLEX) 500 MG capsule  4 times daily     07/29/19 1002           Discharge Instructions     Please keep wound clean and dry. I have prescribed an antibiotic to prevent infection - take as prescribed  Continue taking all of your medications including your seizure medication Wear the knee brace for stability. You may take Ibuprofen and Tylenol as needed for pain Follow up with your PCP regarding your ED  visit today. They can recheck your wound in 1-2 weeks time. Follow up with your orthopedist as well regarding your knee pain.  While at home please rest, ice, and elevate your knee to reduce pain/inflammation  Return to the ED for any worsening symptoms including persistent seizure or signs of infection around the wound itself including redness/swelling/drainage of pus or if you develop fevers > 100.4 or chills       Eustaquio Maize, PA-C 07/29/19 1004    Fredia Sorrow, MD 08/03/19 1218

## 2019-08-10 ENCOUNTER — Encounter: Payer: Self-pay | Admitting: Gastroenterology

## 2019-08-10 NOTE — Progress Notes (Signed)
Referring Provider:  Celene Squibb, MD  Primary Care Physician:  Celene Squibb, MD  Primary GI: Dr. Gala Romney  Patient Location: Home   Provider Location: [facility] office   Reason for Visit: Rectal bleeding and follow-up of chronic GI issues.    Persons present on the virtual encounter, with roles: Aliene Altes, PA-C (Provider); [name] (patient)    Total time (minutes) spent on medical discussion: 20 minutes  Virtual Visit via Telephone Note Due to COVID-19, visit is conducted virtually and was requested by patient.   I connected with@ on 08/12/19 at 10:30 AM EDT by viseo and verified that I am speaking with the correct person using two identifiers.   I discussed the limitations, risks, security and privacy concerns of performing an evaluation and management service by video and the availability of in person appointments. I also discussed with the patient that there may be a patient responsible charge related to this service. The patient expressed understanding and agreed to proceed.  Chief Complaint  Patient presents with  . GI Bleeding    had RB several weeks ago but none since     History of Present Illness: [name] is a 49 y.o. female presenting today at the request of Dr. Nevada Crane for GI bleeding. GI history of GERD, dysphagia s/p dilation in 2014 and 2017, benign fundic gland polyps, and constipation. Last EGD in 2017 with normal esophagus s/p dilation, few gastric polyps resected and retrieved, normal duodenum.  Gastric polyps benign.. No prior colonoscopy. Last seen in our office in October 2019 for constipation and GERD. GERD was well controlled on Protonix BID. Taking Linzess as needed for constipation.   Reviewed information included in the referral. Labs 07/01/2019: Hemoglobin 13.4, normocytic indices, platelets 287. Office visit 07/04/2019 with patient reporting blood per rectum on and off for 2 weeks.  Noted blood when wiping but also on the toilet at times.   She was referred to GI and provided Preparation H suppositories.  Today:  Rectal bleeding started several weeks ago. Lasted 1-2 weeks. Occurred every other day. Bright red. Blood on toilet tissue, in stool, and in toilet water. Also passed only blood as well. No known hemorrhoids. No prolapsing tissue. Occasional itching with the rectal bleeding. No sharp rectal pain. This is the first time this has ever happened. Never obtained suppositories prescribed by PCP. Rectal bleeding resolved on its own.   Denies having constipation at time of rectal bleeding. Has history of chronic constipation. Taking Linzess every other day. If she skips having a BM for 1 day, she takes Linzess the following day. Overall, feels Linzess 290 mcg is too strong as she has to be at home due to urgent watery stools. Trulance in the past didn't work well. Never been on any lower doses of Linzess.   No abdominal pain. Has a lot of bloating/gas. Feels better after a BM. Eating vegetables frequently. Bought a probiotic yesterday to start trying.   GERD: Protonix BID. Keeps GERD well controlled. Having dysphagia again. Started several weeks ago. Occurs with pills daily. Also trouble with solid foods a few times a week. No trouble with soft foods to liquids. Has had improvement with dilations. No melena.   Takes Advil and ibuprofen 1-2 times a week.   No nausea or vomiting. Has gained some weight. Reports weighting 178 lbs, 5'6".     Past Medical History:  Diagnosis Date  . Anxiety   . Arthritis   . Balance problems   .  Chronic abdominal pain   . Chronic knee pain   . Constipation   . Difficulty walking   . GERD (gastroesophageal reflux disease)   . Headache   . HTN (hypertension)   . Low back pain   . Panic attacks   . Polyuria   . Seizures ([place])    unknown etiology; on dilantin; last seizures was a few years ago.  . Sleep apnea      Past Surgical History:  Procedure Laterality Date  . BACK SURGERY      complicated by ureteral injury  . CHONDROPLASTY Right 08/31/2016   Procedure: CHONDROPLASTY RIGHT FEMUR;  Surgeon: Carole Civil, MD;  Location: [facility];  Service: Orthopedics;  Laterality: Right;  . ESOPHAGOGASTRODUODENOSCOPY N/A 09/11/2015   Procedure: ESOPHAGOGASTRODUODENOSCOPY (EGD);  Surgeon: Daneil Dolin, MD;  Normal esophagus s/p dilation, few gastric polyps resected and retrieved, normal duodenum. Gastric polyps benign.   . ESOPHAGOGASTRODUODENOSCOPY (EGD) WITH ESOPHAGEAL DILATION N/A 03/20/2013   Dr. Rourk:Normal EGD-status post passage of a Maloney dilator/Status post esophageal biopsy., benign path  . KNEE ARTHROSCOPY WITH MEDIAL MENISECTOMY Right 08/31/2016   Procedure: KNEE ARTHROSCOPY WITH MEDIAL MENISECTOMY;  Surgeon: Carole Civil, MD;  Location: [facility];  Service: Orthopedics;  Laterality: Right;  . KNEE ARTHROSCOPY WITH MEDIAL MENISECTOMY Right 09/13/2018   Procedure: KNEE ARTHROSCOPY WITH MEDIAL MENISCECTOMY AND MICROFRACTURE;  Surgeon: Carole Civil, MD;  Location: [facility];  Service: Orthopedics;  Laterality: Right;  . MALONEY DILATION N/A 09/11/2015   Procedure: Venia Minks DILATION;  Surgeon: Daneil Dolin, MD;  Location: [facility];  Service: Endoscopy;  Laterality: N/A;     Current Meds  Medication Sig  . ALPRAZolam (XANAX) 0.5 MG tablet Take 0.5 mg by mouth 2 (two) times daily as needed for anxiety.   Marland Kitchen amLODipine (NORVASC) 10 MG tablet Take 10 mg by mouth daily.  . DULoxetine (CYMBALTA) 20 MG capsule Take 20 mg by mouth daily.  Marland Kitchen estradiol (ESTRACE) 0.5 MG tablet TAKE 1 TABLET BY MOUTH DAILY  . irbesartan (AVAPRO) 300 MG tablet Take 300 mg by mouth daily.  Marland Kitchen LINZESS 290 MCG CAPS capsule TAKE (1) CAPSULE BY MOUTH ONCE DAILY BEFORE BREAKFAST.  . MYRBETRIQ 25 MG TB24 tablet Take 25 mg by mouth daily.   . Omega-3 Fatty Acids (FISH OIL) 1000 MG CAPS Take 1,000 mg by mouth in the morning, at noon, and at bedtime.   Marland Kitchen oxyCODONE-acetaminophen (PERCOCET/ROXICET)  5-325 MG tablet Take 1 tablet by mouth every 4 (four) hours as needed for severe pain.  . pantoprazole (PROTONIX) 40 MG tablet TAKE 1 TABLET TWICE DAILY BEFORE A MEAL.  Marland Kitchen phenytoin (DILANTIN) 100 MG ER capsule Take 100-200 mg by mouth See admin instructions. 260m in the morning and 1054mat bedtime  . progesterone (PROMETRIUM) 100 MG capsule Take 1 capsule (100 mg total) by mouth daily. For 14 days every 3 months.  . rosuvastatin (CRESTOR) 5 MG tablet Take 5 mg by mouth daily.     Family History  Problem Relation Age of Onset  . Throat cancer Father   . Heart disease Father   . Cancer Father   . Hypertension Father   . Diabetes Father   . Other Mother        esophageal stricture and gallbladder disease  . Thyroid disease Mother   . Other Sister        esophageal stricture and gallbladder problems.   . Diabetes Sister   . Hypertension Sister   .  Thyroid disease Sister   . Heart disease Maternal Grandfather   . Diabetes Maternal Grandfather   . Arthritis Maternal Grandfather   . Colon cancer Neg Hx   . Liver disease Neg Hx   . Pancreatic cancer Neg Hx     Social History   Socioeconomic History  . Marital status: Single    Spouse name: Not on file  . Number of children: 0  . Years of education: 79  . Highest education level: Not on file  Occupational History  . Occupation: disability    Employer: DISABLED  Tobacco Use  . Smoking status: Former Smoker    Packs/day: 2.00    Years: 9.00    Pack years: 18.00    Types: Cigarettes    Quit date: 04/04/1994    Years since quitting: 25.3  . Smokeless tobacco: Never Used  . Tobacco comment: smoked 8 years, quit 18 years ago in the 1996  Substance and Sexual Activity  . Alcohol use: No    Alcohol/week: 0.0 standard drinks  . Drug use: No  . Sexual activity: Not Currently    Birth control/protection: None  Other Topics Concern  . Not on file  Social History Narrative  . Not on file   Social Determinants of Health    Financial Resource Strain:   . Difficulty of Paying Living Expenses:   Food Insecurity:   . Worried About Charity fundraiser in the Last Year:   . Arboriculturist in the Last Year:   Transportation Needs:   . Film/video editor (Medical):   Marland Kitchen Lack of Transportation (Non-Medical):   Physical Activity:   . Days of Exercise per Week:   . Minutes of Exercise per Session:   Stress:   . Feeling of Stress :   Social Connections:   . Frequency of Communication with Friends and Family:   . Frequency of Social Gatherings with Friends and Family:   . Attends Religious Services:   . Active Member of Clubs or Organizations:   . Attends Archivist Meetings:   Marland Kitchen Marital Status:        Review of Systems: Gen: Denies fever, chills, lightheadedness, dizziness, presyncope, syncope. CV: Denies chest pain or palpitations. Resp: Denies dyspnea or cough. GI: see HPI Derm: Denies rash Heme: See HPI  Observations/Objective: No distress. Alert and oriented. Pleasant. Well nourished. Normal mood and affect. Unable to perform complete physical exam due to video encounter.   Assessment and Plan: 49 year old female with history of GERD, dysphagia, and constipation presenting today at the request of Dr. Nevada Crane for rectal bleeding.  We also discussed her chronic medical conditions.  Rectal bleeding: 1-2 weeks of rectal bleeding occurring every other day around the end of March/beginning of April 2021.  No prior history of rectal bleeding. Bleeding resolved on its own.  Denies history of hemorrhoids.  Reports having mild rectal itching with rectal bleeding but no rectal pain.  Denies associated constipation.  Intermittent diarrhea due to Linzess 290 mcg.  Denies abdominal pain.  No unintentional weight loss.  No prior colonoscopy.  No family history of colon cancer. Hemoglobin 13.4 on 07/01/19.   Rectal bleeding could be secondary to benign anorectal source such as hemorrhoids possibly  irritated by intermittent diarrhea.  However, cannot rule out colon polyps or malignancy.  She will need colonoscopy for further evaluation.  Constipation: Chronic. Taking Linzess 290 mcg every other day.  Overall, she feels Linzess 290 mcg is too strong  as she will have to stay home due to urgent watery BMs.  Reports being on Trulance in the past which did not work well.  Has never tried any lower doses of Linzess.  We will plan to trial Linzess 145 mcg.  Patient states she will come by the office to pick samples up.  Gas/bloating: Seems to be associated with constipation.  Only taking Linzess 290 mcg every other day due to Linzess causing diarrhea.  On the days she does not take Linzess, she does not have a bowel movement and will have increased gas/bloating.  We will trial Linzess 145 mcg in hopes that she can take this daily to have a bowel movement daily.  We also discussed common items that cause gas and bloating and she was advised to avoid these.  Handout provided.  Dysphagia: Currently with daily pill dysphagia as well as solid food dysphagia a few times a week.  GERD symptoms are well controlled on Protonix twice daily. Similar symptoms in the past that have improved with empiric dilation.  Last EGD with normal esophagus s/p empiric dilation.  Patient may have esophageal web, ring, or stricture.  Malignancy less likely.  We will proceed EGD at the time of colonoscopy for further evaluation and therapeutic intervention.  GERD: Chronic.  Well-controlled on Protonix 40 mg twice daily.  Plan to continue current medications.  Plan: 1.  Proceed with TCS + EGD +/-dilation with propofol with Dr. Gala Romney in the near future. The risks, benefits, and alternatives have been discussed in detail with patient. They have stated understanding and desire to proceed.  Propofol due to polypharmacy. 2.  Advised to consume soft textured foods.  Avoid tough meats.  All meats should be ground finely.  3.  Stop Linzess  290 mcg and try Linzess 145 mcg daily.  Patient to come by the office to pick up samples.  Requested progress report in 1-2 weeks. 4.  Avoid gas producing items.  Counseled on this.  Handout provided. 5.  Continue Protonix 40 mg twice daily 30 minutes before breakfast and dinner. 6.  Counseled on GERD diet.  Handout provided. 7.  Advised she try limiting ibuprofen and Advil as well as all other NSAIDs as much as possible. 8.  Try Tylenol for additional pain relief.  No more than 2 g total of Tylenol daily 9.  Patient was advised to monitor for return of rectal bleeding and let us know.  10. Advised to proceed to the emergency room if she were to have significant rectal bleeding or feel weak, lightheaded or like she may pass out. Also also advised to proceed to the emergency room if something were to get hung in her esophagus and not come up or go down.  Follow Up Instructions: Follow-up after procedures.  Call with questions or concerns prior.   I discussed the assessment and treatment plan with the patient. The patient was provided an opportunity to ask questions and all were answered. The patient agreed with the plan and demonstrated an understanding of the instructions.   The patient was advised to call back or seek an in-person evaluation if the symptoms worsen or if the condition fails to improve as anticipated.  I provided 20 minutes of video-face-to-face time during this encounter.  Aliene Altes, PA-C [facility]

## 2019-08-12 ENCOUNTER — Other Ambulatory Visit: Payer: Self-pay

## 2019-08-12 ENCOUNTER — Ambulatory Visit (INDEPENDENT_AMBULATORY_CARE_PROVIDER_SITE_OTHER): Payer: Medicare Other | Admitting: Gastroenterology

## 2019-08-12 ENCOUNTER — Encounter: Payer: Self-pay | Admitting: Gastroenterology

## 2019-08-12 DIAGNOSIS — K219 Gastro-esophageal reflux disease without esophagitis: Secondary | ICD-10-CM | POA: Diagnosis not present

## 2019-08-12 DIAGNOSIS — R1319 Other dysphagia: Secondary | ICD-10-CM

## 2019-08-12 DIAGNOSIS — R1310 Dysphagia, unspecified: Secondary | ICD-10-CM | POA: Diagnosis not present

## 2019-08-12 DIAGNOSIS — K625 Hemorrhage of anus and rectum: Secondary | ICD-10-CM | POA: Diagnosis not present

## 2019-08-12 DIAGNOSIS — K5900 Constipation, unspecified: Secondary | ICD-10-CM

## 2019-08-12 NOTE — Patient Instructions (Addendum)
We will get you scheduled for a colonoscopy and upper endoscopy with possible dilation of your esophagus in the near future with Dr. Gala Romney.  For your trouble swallowing, I recommend you stick to softer textures for now.  Avoid tough meats.  All meats should be ground finely.  Be sure you are eating slowly, taking small bites, chewing well, and drinking plenty of fluids throughout your meals.  If something were to get hung in your esophagus and not come up or go down, you should proceed to the emergency room.  For constipation, stop Linzess 290 mcg and try Linzess 145 mcg daily.  Please come by the office to pick up samples.  Call with progress report in 1-2 weeks.  For GERD, continue taking Protonix 40 mg twice daily 30 minutes before breakfast and dinner.  Follow a GERD diet.  Avoid fried, fatty, greasy, spicy, citrus foods.  Avoid caffeine and carbonated beverages.  Avoid eating within 3 hours of going to bed.  Try limiting ibuprofen and Advil as well as all other NSAIDs as much as possible.  Try Tylenol for pain.  No more than 2 g of Tylenol daily.  For gas and bloating, I suspect this is likely secondary to underlying constipation but may also be influenced by dietary habits. Avoid gas producing items including broccoli, cauliflower, cabbage, Brussels sprouts, beans, carbonated beverages, artificial sweeteners, chewing gum, and drinking through a straw.  See handout below.  We will plan to follow-up with you after your procedures.    *Should you have return of rectal bleeding, please let us know.  If you have significant rectal bleeding or feel weak, lightheaded, or like you will pass out, proceed to the emergency room.  Aliene Altes, PA-C [facility]   Abdominal Bloating When you have abdominal bloating, your abdomen may feel full, tight, or painful. It may also look bigger than normal or swollen (distended). Common causes of abdominal bloating  include:  Swallowing air.  Constipation.  Problems digesting food.  Eating too much.  Irritable bowel syndrome. This is a condition that affects the large intestine.  Lactose intolerance. This is an inability to digest lactose, a natural sugar in dairy products.  Celiac disease. This is a condition that affects the ability to digest gluten, a protein found in some grains.  Gastroparesis. This is a condition that slows down the movement of food in the stomach and small intestine. It is more common in people with diabetes mellitus.  Gastroesophageal reflux disease (GERD). This is a digestive condition that makes stomach acid flow back into the esophagus.  Urinary retention. This means that the body is holding onto urine, and the bladder cannot be emptied all the way. Follow these instructions at home: Eating and drinking  Avoid eating too much.  Try not to swallow air while talking or eating.  Avoid eating while lying down.  Avoid these foods and drinks: ? Foods that cause gas, such as broccoli, cabbage, cauliflower, and baked beans. ? Carbonated drinks. ? Hard candy. ? Chewing gum. Medicines  Take over-the-counter and prescription medicines only as told by your health care provider.  Take probiotic medicines. These medicines contain live bacteria or yeasts that can help digestion.  Take coated peppermint oil capsules. Activity  Try to exercise regularly. Exercise may help to relieve bloating that is caused by gas and relieve constipation. General instructions  Keep all follow-up visits as told by your health care provider. This is important. Contact a health care provider if:  You have nausea and vomiting.  You have diarrhea.  You have abdominal pain.  You have unusual weight loss or weight gain.  You have severe pain, and medicines do not help. Get help right away if:  You have severe chest pain.  You have trouble breathing.  You have shortness of  breath.  You have trouble urinating.  You have darker urine than normal.  You have blood in your stools or have dark, tarry stools. Summary  Abdominal bloating means that the abdomen is swollen.  Common causes of abdominal bloating are swallowing air, constipation, and problems digesting food.  Avoid eating too much and avoid swallowing air.  Avoid foods that cause gas, carbonated drinks, hard candy, and chewing gum. This information is not intended to replace advice given to you by your health care provider. Make sure you discuss any questions you have with your health care provider. Document Revised: 07/09/2018 Document Reviewed: 04/22/2016 Elsevier Patient Education  [place].

## 2019-08-27 ENCOUNTER — Telehealth: Payer: Self-pay

## 2019-08-27 NOTE — Telephone Encounter (Signed)
Tried to call pt to schedule TCS/EGD/-/+DIL w/Propofol w/RMR, no answer, LMOVM for return call.

## 2019-08-27 NOTE — Telephone Encounter (Addendum)
Had tried to call pt d/t cancellation.  Pt called office, opening for procedure has been filled since she was called. She is aware we will call her later.

## 2019-08-28 ENCOUNTER — Telehealth: Payer: Self-pay | Admitting: *Deleted

## 2019-08-28 NOTE — Telephone Encounter (Signed)
[name], you are scheduled for a virtual visit with your provider today.  Just as we do with appointments in the office, we must obtain your consent to participate.  Your consent will be active for this visit and any virtual visit you may have with one of our providers in the next 365 days.  If you have a MyChart account, I can also send a copy of this consent to you electronically.  All virtual visits are billed to your insurance company just like a traditional visit in the office.  As this is a virtual visit, video technology does not allow for your provider to perform a traditional examination.  This may limit your provider's ability to fully assess your condition.  If your provider identifies any concerns that need to be evaluated in person or the need to arrange testing such as labs, EKG, etc, we will make arrangements to do so.  Although advances in technology are sophisticated, we cannot ensure that it will always work on either your end or our end.  If the connection with a video visit is poor, we may have to switch to a telephone visit.  With either a video or telephone visit, we are not always able to ensure that we have a secure connection.   I need to obtain your verbal consent now.   Are you willing to proceed with your visit today?

## 2019-08-28 NOTE — Telephone Encounter (Signed)
Pt consented to a telephone visit on 08/12/19.

## 2019-08-29 ENCOUNTER — Encounter: Payer: Self-pay | Admitting: Orthopedic Surgery

## 2019-08-29 ENCOUNTER — Other Ambulatory Visit: Payer: Self-pay

## 2019-08-29 ENCOUNTER — Ambulatory Visit (INDEPENDENT_AMBULATORY_CARE_PROVIDER_SITE_OTHER): Payer: Medicare Other | Admitting: Orthopedic Surgery

## 2019-08-29 VITALS — BP 139/93 | HR 93 | Ht 66.0 in | Wt 175.0 lb

## 2019-08-29 DIAGNOSIS — W19XXXA Unspecified fall, initial encounter: Secondary | ICD-10-CM

## 2019-08-29 DIAGNOSIS — S8001XA Contusion of right knee, initial encounter: Secondary | ICD-10-CM | POA: Diagnosis not present

## 2019-08-29 NOTE — Patient Instructions (Signed)
Bruised knee cap   Exercises for 6 weeks

## 2019-08-29 NOTE — Progress Notes (Signed)
NEW PROBLEM//OFFICE VISIT  Chief Complaint  Patient presents with  . Knee Pain    1 year after arthroscopy still has some pain at times     49 year old female fell on April 26 down onto her right knee.  She was cutting some rib and cut the tip of her finger off which led to a seizure that caused her to fall on a flexed knee and she went to the emergency room x-rays were negative  She did have surgery 1 year ago comes in with anterior knee pain   Review of Systems  Neurological: Positive for seizures.  All other systems reviewed and are negative.    Past Medical History:  Diagnosis Date  . Anxiety   . Arthritis   . Balance problems   . Chronic abdominal pain   . Chronic knee pain   . Constipation   . Difficulty walking   . GERD (gastroesophageal reflux disease)   . Headache   . HTN (hypertension)   . Low back pain   . Panic attacks   . Polyuria   . Seizures ([place])    unknown etiology; on dilantin; last seizures was a few years ago.  . Sleep apnea     Past Surgical History:  Procedure Laterality Date  . BACK SURGERY     complicated by ureteral injury  . CHONDROPLASTY Right 08/31/2016   Procedure: CHONDROPLASTY RIGHT FEMUR;  Surgeon: Carole Civil, MD;  Location: [facility];  Service: Orthopedics;  Laterality: Right;  . ESOPHAGOGASTRODUODENOSCOPY N/A 09/11/2015   Procedure: ESOPHAGOGASTRODUODENOSCOPY (EGD);  Surgeon: Daneil Dolin, MD;  Normal esophagus s/p dilation, few gastric polyps resected and retrieved, normal duodenum. Gastric polyps benign.   . ESOPHAGOGASTRODUODENOSCOPY (EGD) WITH ESOPHAGEAL DILATION N/A 03/20/2013   Dr. Rourk:Normal EGD-status post passage of a Maloney dilator/Status post esophageal biopsy., benign path  . KNEE ARTHROSCOPY WITH MEDIAL MENISECTOMY Right 08/31/2016   Procedure: KNEE ARTHROSCOPY WITH MEDIAL MENISECTOMY;  Surgeon: Carole Civil, MD;  Location: [facility];  Service: Orthopedics;  Laterality: Right;  . KNEE ARTHROSCOPY WITH MEDIAL  MENISECTOMY Right 09/13/2018   Procedure: KNEE ARTHROSCOPY WITH MEDIAL MENISCECTOMY AND MICROFRACTURE;  Surgeon: Carole Civil, MD;  Location: [facility];  Service: Orthopedics;  Laterality: Right;  . MALONEY DILATION N/A 09/11/2015   Procedure: Venia Minks DILATION;  Surgeon: Daneil Dolin, MD;  Location: [facility];  Service: Endoscopy;  Laterality: N/A;    Family History  Problem Relation Age of Onset  . Throat cancer Father   . Heart disease Father   . Cancer Father   . Hypertension Father   . Diabetes Father   . Other Mother        esophageal stricture and gallbladder disease  . Thyroid disease Mother   . Other Sister        esophageal stricture and gallbladder problems.   . Diabetes Sister   . Hypertension Sister   . Thyroid disease Sister   . Heart disease Maternal Grandfather   . Diabetes Maternal Grandfather   . Arthritis Maternal Grandfather   . Colon cancer Neg Hx   . Liver disease Neg Hx   . Pancreatic cancer Neg Hx    Social History   Tobacco Use  . Smoking status: Former Smoker    Packs/day: 2.00    Years: 9.00    Pack years: 18.00    Types: Cigarettes    Quit date: 04/04/1994    Years since quitting: 25.4  . Smokeless tobacco: Never Used  .  Tobacco comment: smoked 8 years, quit 18 years ago in the 1996  Substance Use Topics  . Alcohol use: No    Alcohol/week: 0.0 standard drinks  . Drug use: No    Allergies  Allergen Reactions  . Benadryl [Diphenhydramine Hcl] Rash    Current Meds  Medication Sig  . ALPRAZolam (XANAX) 0.5 MG tablet Take 0.5 mg by mouth 2 (two) times daily as needed for anxiety.   Marland Kitchen amLODipine (NORVASC) 10 MG tablet Take 10 mg by mouth daily.  . DULoxetine (CYMBALTA) 30 MG capsule Take 30 mg by mouth daily.  Marland Kitchen estradiol (ESTRACE) 0.5 MG tablet TAKE 1 TABLET BY MOUTH DAILY  . irbesartan (AVAPRO) 300 MG tablet Take 300 mg by mouth daily.  Marland Kitchen LINZESS 290 MCG CAPS capsule TAKE (1) CAPSULE BY MOUTH ONCE DAILY BEFORE BREAKFAST.  .  meloxicam (MOBIC) 15 MG tablet TAKE 1 TABLET BY MOUTH DAILY FOR 2 WEEKS THEN AS NEEDED FOR INFLAMMATION  . MYRBETRIQ 25 MG TB24 tablet Take 25 mg by mouth daily.   . Omega-3 Fatty Acids (FISH OIL) 1000 MG CAPS Take 1,000 mg by mouth in the morning, at noon, and at bedtime.   Marland Kitchen oxyCODONE-acetaminophen (PERCOCET/ROXICET) 5-325 MG tablet Take 1 tablet by mouth every 4 (four) hours as needed for severe pain.  . pantoprazole (PROTONIX) 40 MG tablet TAKE 1 TABLET TWICE DAILY BEFORE A MEAL.  Marland Kitchen phenytoin (DILANTIN) 100 MG ER capsule Take 100-200 mg by mouth See admin instructions. 232m in the morning and 1062mat bedtime  . progesterone (PROMETRIUM) 100 MG capsule Take 1 capsule (100 mg total) by mouth daily. For 14 days every 3 months.  . rosuvastatin (CRESTOR) 5 MG tablet Take 5 mg by mouth daily.    BP (!) 139/93   Pulse 93   Ht 5' 6"  (1.676 m)   Wt 175 lb (79.4 kg)   LMP 07/06/2018   BMI 28.25 kg/m   Physical Exam Constitutional:      General: She is not in acute distress.    Appearance: She is well-developed.  Cardiovascular:     Comments: No peripheral edema Skin:    General: Skin is warm and dry.  Neurological:     Mental Status: She is alert and oriented to person, place, and time.     Sensory: No sensory deficit.     Coordination: Coordination normal.     Gait: Gait normal.     Deep Tendon Reflexes: Reflexes are normal and symmetric.     Ortho Exam Left knee  Skin is normal no tenderness full range of motion ligaments are stable muscle tone is normal neurovascular exam is intact  Right knee  Tenderness over the front of the knee including patellar tendon patella no joint effusion range of motion is normal muscle strength is normal muscle tone is normal neurovascular exam is intact     MEDICAL DECISION MAKING  A. No diagnosis found.  B. DATA ANALYSED:    IMAGING: Independent interpretation of images: X-rays from the hospital 4 views no fracture dislocation or  arthritis Orders:   Outside records reviewed: Emergency room C. MANAGEMENT   Recommend quadricep strengthening exercises for 6 weeks if no improvement she can come back for reevaluation  No orders of the defined types were placed in this encounter.     StArther AbbottMD  08/29/2019 2:52 PM

## 2019-10-02 ENCOUNTER — Telehealth: Payer: Self-pay | Admitting: Emergency Medicine

## 2019-10-02 NOTE — Telephone Encounter (Signed)
Pt called and stated she received a call and she is suppose to be getting set up for a colonoscopy.

## 2019-10-02 NOTE — Telephone Encounter (Signed)
Called pt. She states she is going to call back as she is on another call

## 2019-10-02 NOTE — Telephone Encounter (Signed)
See prior note

## 2019-10-02 NOTE — Telephone Encounter (Signed)
LMOVM to schedule tcs/egd +/-dil with propofol with RMR

## 2019-10-03 ENCOUNTER — Telehealth: Payer: Self-pay | Admitting: Internal Medicine

## 2019-10-03 NOTE — Telephone Encounter (Signed)
Pt LMOM asking for the nurse to call her back. [phone]

## 2019-10-03 NOTE — Telephone Encounter (Signed)
Routed note to angie stallings

## 2019-10-03 NOTE — Telephone Encounter (Signed)
Pt called stated she received a letter in the mail about getting set up for an colonoscopy. The notes are a little confusing not sure if she needs to be called by you or the rga pool

## 2019-10-03 NOTE — Telephone Encounter (Signed)
Lmom, waiting on a return call.  

## 2019-10-04 NOTE — Telephone Encounter (Signed)
Routed to [place].  Pt was calling back since previous note on 10/02/19.

## 2019-10-04 NOTE — Telephone Encounter (Addendum)
[facility] Clinical Pool:  Pt called on 10/03/19.  May be getting back with [place] since previous call on 10/02/19 to set up procedure.  Needs to schedule TCS/ EGD +/- dilation with Propofol.

## 2019-10-23 NOTE — Telephone Encounter (Signed)
Called and informed we will call her to schedule procedure when RMR's September schedule is available.

## 2019-10-23 NOTE — Telephone Encounter (Signed)
Pt called back to schedule procedure.  Can you call back when you get a chance? Routing to [employer].

## 2019-11-08 ENCOUNTER — Telehealth: Payer: Self-pay | Admitting: *Deleted

## 2019-11-08 NOTE — Telephone Encounter (Signed)
PA approved via [facility] website. Auth# [id number] dates 12/19/2019-03/18/2020

## 2019-11-08 NOTE — Telephone Encounter (Signed)
Returning a call.  [phone]

## 2019-11-08 NOTE — Telephone Encounter (Signed)
LMOVM for pt to scheduled for her procedures

## 2019-11-08 NOTE — Telephone Encounter (Signed)
Called pt. She is scheduled for procedure 9/16 at 1:15pm. Patient aware will need covid test prior. appt scheduled for 9/15 at 9:00am. Instructions with appt mailed to pt., confirmed mailing address.

## 2019-11-10 ENCOUNTER — Ambulatory Visit
Admission: EM | Admit: 2019-11-10 | Discharge: 2019-11-10 | Disposition: A | Discharge status: Home / Self Care | Payer: Medicare Other | Attending: Emergency Medicine | Admitting: Emergency Medicine

## 2019-11-10 ENCOUNTER — Other Ambulatory Visit: Payer: Self-pay

## 2019-11-10 DIAGNOSIS — R5383 Other fatigue: Secondary | ICD-10-CM

## 2019-11-10 DIAGNOSIS — R519 Headache, unspecified: Secondary | ICD-10-CM | POA: Diagnosis not present

## 2019-11-10 DIAGNOSIS — Z1152 Encounter for screening for COVID-19: Secondary | ICD-10-CM

## 2019-11-10 MED ORDER — ONDANSETRON HCL 4 MG PO TABS: 4.0000 mg | ORAL_TABLET | Freq: Four times a day (QID) | ORAL | 0 refills | Status: AC

## 2019-11-10 MED ORDER — SUMATRIPTAN SUCCINATE 6 MG/0.5ML ~~LOC~~ SOLN: 6.0000 mg | Freq: Once | SUBCUTANEOUS | Status: DC

## 2019-11-10 MED ORDER — DEXAMETHASONE SODIUM PHOSPHATE 10 MG/ML IJ SOLN
10.0000 mg | Freq: Once | INTRAMUSCULAR | Status: AC
  Administered 2019-11-10: 10 mg via INTRAMUSCULAR

## 2019-11-10 MED ORDER — KETOROLAC TROMETHAMINE 30 MG/ML IJ SOLN
30.0000 mg | Freq: Once | INTRAMUSCULAR | Status: AC
  Administered 2019-11-10: 30 mg via INTRAMUSCULAR

## 2019-11-10 MED ORDER — ONDANSETRON 4 MG PO TBDP
4.0000 mg | ORAL_TABLET | Freq: Once | ORAL | Status: AC
  Administered 2019-11-10: 4 mg via ORAL

## 2019-11-10 NOTE — ED Triage Notes (Signed)
Pt began feeling bad about 2 days ago, c/o feeling weak and having headache and hearing "wooshing in head "

## 2019-11-10 NOTE — Discharge Instructions (Signed)
Migraine cocktail given in office for headache.  Please go to the ED for further evaluation and management if symptoms do not improve with interventions.   Get plenty of rest and push fluids Zofran as needed for nausea.   Use OTC medications like ibuprofen or tylenol as needed fever or pain Call or go to the ED if you have any new or worsening symptoms such as fever, cough, shortness of breath, chest tightness, chest pain, turning blue, changes in mental status, slurred speech, facial droop, weakness in arms or legs, etc..Marland Kitchen

## 2019-11-10 NOTE — ED Provider Notes (Signed)
[place]   [id number] 11/10/19 Arrival Time: 1005   CC: HA  SUBJECTIVE: History from: patient.  [name] is a 49 y.o. female who presents with fatigue, nausea, vomiting, sweating, and headache.  Denies sick exposure to COVID, flu or strep.  Received both COVID vaccine.  Has tried OTC medications without relief.  Symptoms are made worse with light.  Denies previous COVID infection.   Denies fever, chills, fatigue, sinus pain, rhinorrhea, sore throat, CP, SOB, wheezing, chest pain, changes in bowel or bladder habits.    ROS: As per HPI.  All other pertinent ROS negative.     Past Medical History:  Diagnosis Date   Anxiety    Arthritis    Balance problems    Chronic abdominal pain    Chronic knee pain    Constipation    Difficulty walking    GERD (gastroesophageal reflux disease)    Headache    HTN (hypertension)    Low back pain    Panic attacks    Polyuria    Seizures ([place])    unknown etiology; on dilantin; last seizures was a few years ago.   Sleep apnea    Past Surgical History:  Procedure Laterality Date   BACK SURGERY     complicated by ureteral injury   CHONDROPLASTY Right 08/31/2016   Procedure: CHONDROPLASTY RIGHT FEMUR;  Surgeon: Carole Civil, MD;  Location: [facility];  Service: Orthopedics;  Laterality: Right;   ESOPHAGOGASTRODUODENOSCOPY N/A 09/11/2015   Procedure: ESOPHAGOGASTRODUODENOSCOPY (EGD);  Surgeon: Daneil Dolin, MD;  Normal esophagus s/p dilation, few gastric polyps resected and retrieved, normal duodenum. Gastric polyps benign.    ESOPHAGOGASTRODUODENOSCOPY (EGD) WITH ESOPHAGEAL DILATION N/A 03/20/2013   Dr. Rourk:Normal EGD-status post passage of a Maloney dilator/Status post esophageal biopsy., benign path   KNEE ARTHROSCOPY WITH MEDIAL MENISECTOMY Right 08/31/2016   Procedure: KNEE ARTHROSCOPY WITH MEDIAL MENISECTOMY;  Surgeon: Carole Civil, MD;  Location: [facility];  Service: Orthopedics;   Laterality: Right;   KNEE ARTHROSCOPY WITH MEDIAL MENISECTOMY Right 09/13/2018   Procedure: KNEE ARTHROSCOPY WITH MEDIAL MENISCECTOMY AND MICROFRACTURE;  Surgeon: Carole Civil, MD;  Location: [facility];  Service: Orthopedics;  Laterality: Right;   MALONEY DILATION N/A 09/11/2015   Procedure: Venia Minks DILATION;  Surgeon: Daneil Dolin, MD;  [facility];  Service: Endoscopy;  Laterality: N/A;   Allergies  Allergen Reactions   Benadryl [Diphenhydramine Hcl] Rash   No current facility-administered medications on file prior to encounter.   Current Outpatient Medications on File Prior to Encounter  Medication Sig Dispense Refill   ALPRAZolam (XANAX) 0.5 MG tablet Take 0.5 mg by mouth 2 (two) times daily as needed for anxiety.   5   amLODipine (NORVASC) 10 MG tablet Take 10 mg by mouth daily.     DULoxetine (CYMBALTA) 30 MG capsule Take 30 mg by mouth daily.     estradiol (ESTRACE) 0.5 MG tablet TAKE 1 TABLET BY MOUTH DAILY 30 tablet 11   irbesartan (AVAPRO) 300 MG tablet Take 300 mg by mouth daily.  12   LINZESS 290 MCG CAPS capsule TAKE (1) CAPSULE BY MOUTH ONCE DAILY BEFORE BREAKFAST. 90 capsule 3   meloxicam (MOBIC) 15 MG tablet TAKE 1 TABLET BY MOUTH DAILY FOR 2 WEEKS THEN AS NEEDED FOR INFLAMMATION     MYRBETRIQ 25 MG TB24 tablet Take 25 mg by mouth daily.      Omega-3 Fatty Acids (FISH OIL) 1000 MG CAPS Take 1,000 mg by  mouth in the morning, at noon, and at bedtime.      oxyCODONE-acetaminophen (PERCOCET/ROXICET) 5-325 MG tablet Take 1 tablet by mouth every 4 (four) hours as needed for severe pain.     pantoprazole (PROTONIX) 40 MG tablet TAKE 1 TABLET TWICE DAILY BEFORE A MEAL. 180 tablet 4   phenytoin (DILANTIN) 100 MG ER capsule Take 100-200 mg by mouth See admin instructions. 212m in the morning and 1015mat bedtime     progesterone (PROMETRIUM) 100 MG capsule Take 1 capsule (100 mg total) by mouth daily. For 14 days every 3 months. 14 capsule 3    rosuvastatin (CRESTOR) 5 MG tablet Take 5 mg by mouth daily.     Social History   Socioeconomic History   Marital status: Single    Spouse name: Not on file   Number of children: 0   Years of education: 12   Highest education level: Not on file  Occupational History   Occupation: disability    Employer: DISABLED  Tobacco Use   Smoking status: Former Smoker    Packs/day: 2.00    Years: 9.00    Pack years: 18.00    Types: Cigarettes    Quit date: 04/04/1994    Years since quitting: 25.6   Smokeless tobacco: Never Used   Tobacco comment: smoked 8 years, quit 18 years ago in the 1996  Vaping Use   Vaping Use: Never used  Substance and Sexual Activity   Alcohol use: No    Alcohol/week: 0.0 standard drinks   Drug use: No   Sexual activity: Not Currently    Birth control/protection: None  Other Topics Concern   Not on file  Social History Narrative   Not on file   Social Determinants of Health   Financial Resource Strain:    Difficulty of Paying Living Expenses:   Food Insecurity:    Worried About RuCharity fundraisern the Last Year:    RaArboriculturistn the Last Year:   Transportation Needs:    LaFilm/video editorMedical):    Lack of Transportation (Non-Medical):   Physical Activity:    Days of Exercise per Week:    Minutes of Exercise per Session:   Stress:    Feeling of Stress :   Social Connections:    Frequency of Communication with Friends and Family:    Frequency of Social Gatherings with Friends and Family:    Attends Religious Services:    Active Member of Clubs or Organizations:    Attends ClMusic therapist   Marital Status:   Intimate Partner Violence:    Fear of Current or Ex-Partner:    Emotionally Abused:    Physically Abused:    Sexually Abused:    Family History  Problem Relation Age of Onset   Throat cancer Father    Heart disease Father    Cancer Father    Hypertension Father     Diabetes Father    Other Mother        esophageal stricture and gallbladder disease   Thyroid disease Mother    Other Sister        esophageal stricture and gallbladder problems.    Diabetes Sister    Hypertension Sister    Thyroid disease Sister    Heart disease Maternal Grandfather    Diabetes Maternal Grandfather    Arthritis Maternal Grandfather    Colon cancer Neg Hx    Liver disease Neg Hx  Pancreatic cancer Neg Hx     OBJECTIVE:  Vitals:   11/10/19 1035  BP: 124/86  Pulse: (!) 123  Resp: 20  Temp: 98.6 F (37 C)  SpO2: 96%     General appearance: alert; appears fatigued, but nontoxic; speaking in full sentences and tolerating own secretions; sitting in dark upon entering exam room HEENT: NCAT; Ears: EACs clear, TMs pearly gray; Eyes: PERRL.  EOM grossly intact. Nose: nares patent without rhinorrhea, Throat: oropharynx clear, tonsils non erythematous or enlarged, uvula midline  Neck: supple without LAD Lungs: unlabored respirations, symmetrical air entry; cough: absent; no respiratory distress; CTAB Heart: Tachycardic Skin: clammy Neuro: CN 2-12 grossly intact; ambulates without difficulty Psychological: alert and cooperative; normal mood and affect  ASSESSMENT & PLAN:  1. Acute nonintractable headache, unspecified headache type   2. Other fatigue     Meds ordered this encounter  Medications   ondansetron (ZOFRAN) 4 MG tablet    Sig: Take 1 tablet (4 mg total) by mouth every 6 (six) hours.    Dispense:  12 tablet    Refill:  0    Order Specific Question:   Supervising Provider    Answer:   Raylene Everts [[id number]]   ketorolac (TORADOL) 30 MG/ML injection 30 mg   dexamethasone (DECADRON) injection 10 mg   SUMAtriptan (IMITREX) injection 6 mg   ondansetron (ZOFRAN-ODT) disintegrating tablet 4 mg    Migraine cocktail given in office for headache.  Please go to the ED for further evaluation and management if symptoms do not improve  with interventions.   Get plenty of rest and push fluids Zofran as needed for nausea.   Use OTC medications like ibuprofen or tylenol as needed fever or pain Call or go to the ED if you have any new or worsening symptoms such as fever, cough, shortness of breath, chest tightness, chest pain, turning blue, changes in mental status, slurred speech, facial droop, weakness in arms or legs, etc...   Reviewed expectations re: course of current medical issues. Questions answered. Outlined signs and symptoms indicating need for more acute intervention. Patient verbalized understanding. After Visit Summary given.         Lestine Box, PA-C 11/10/19 1106

## 2019-11-11 LAB — NOVEL CORONAVIRUS, NAA: SARS-CoV-2, NAA: NOT DETECTED

## 2019-11-11 LAB — SARS-COV-2, NAA 2 DAY TAT

## 2019-12-03 ENCOUNTER — Encounter (HOSPITAL_COMMUNITY): Payer: Self-pay | Admitting: Internal Medicine

## 2019-12-04 IMAGING — MR MR SHOULDER*L* W/O CM
4 of 5 series · 19 of 40 positions shown · non-contrast
Comparison: Radiographs 11/23/2017

CLINICAL DATA: Left shoulder pain for 3 months with pain and
weakness.

EXAM:
MRI OF THE LEFT SHOULDER WITHOUT CONTRAST
TECHNIQUE: Multiplanar, multisequence MR imaging of the shoulder was performed.
No intravenous contrast was administered.

[Series 3: pdfs axial · axial · 4.0mm · 0.26mm/px · z∈[-47,+27]mm · 4 of 20 slices shown]
[im 1/20]
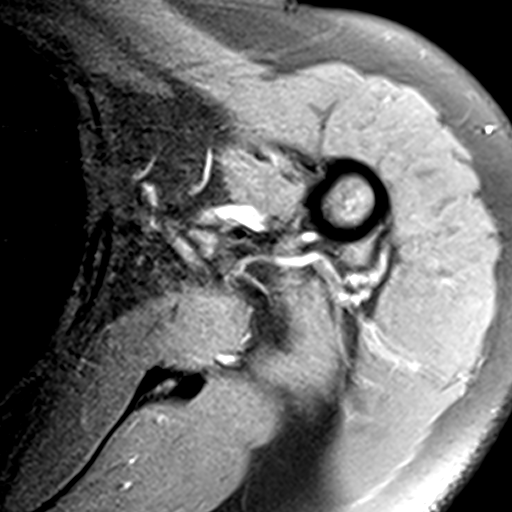
[im 3/20]
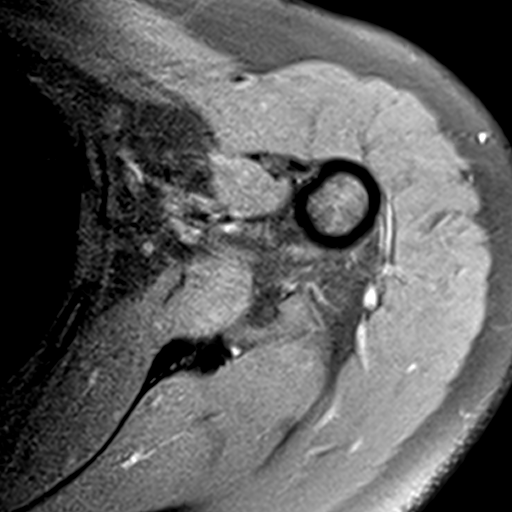
[im 11/20]
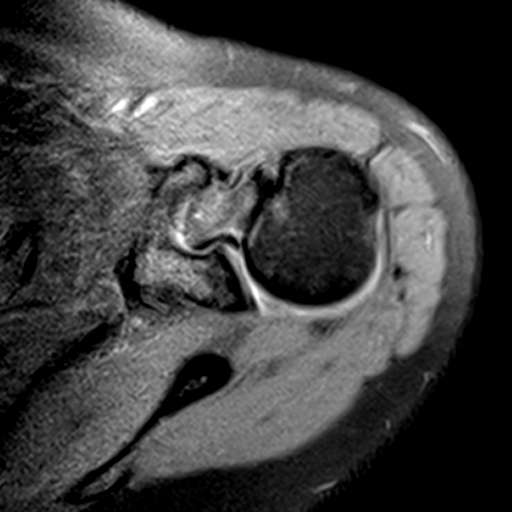
[im 17/20]
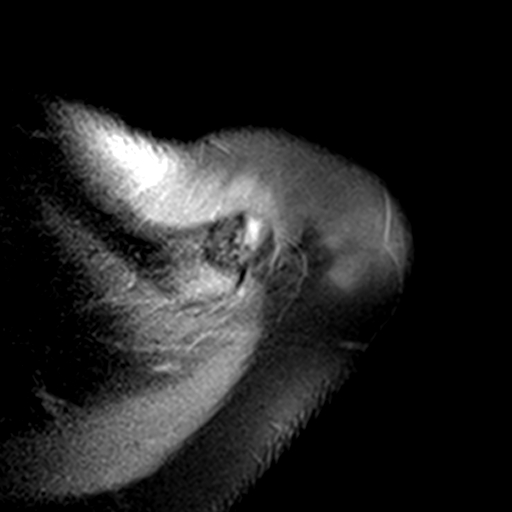

[Series 4: T1 · oblique · 4.0mm · 0.19mm/px · 9 of 24 slices shown]
[im 1/24]
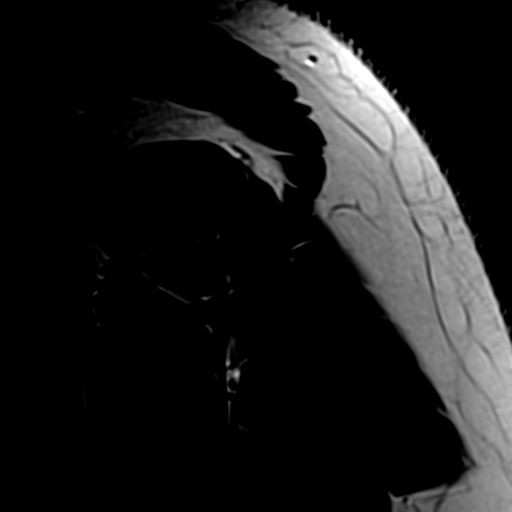
[im 3/24]
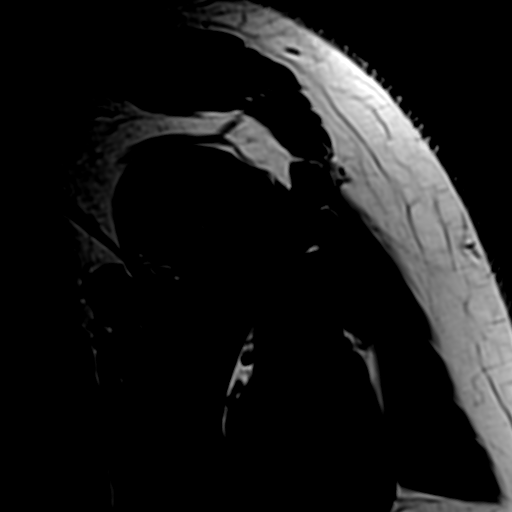
[im 6/24]
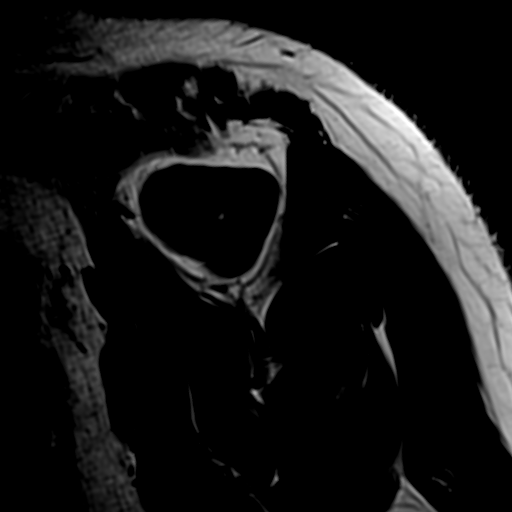
[im 9/24]
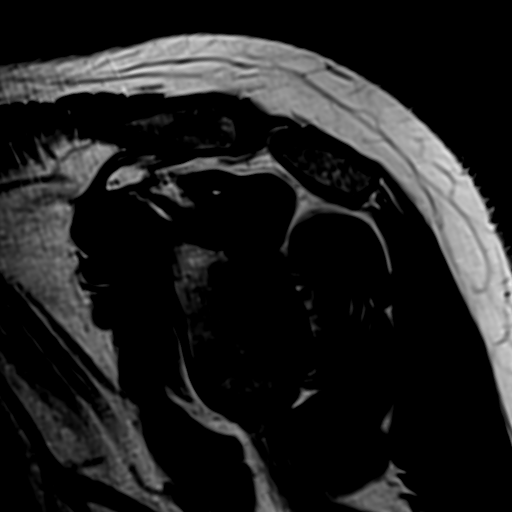
[im 12/24]
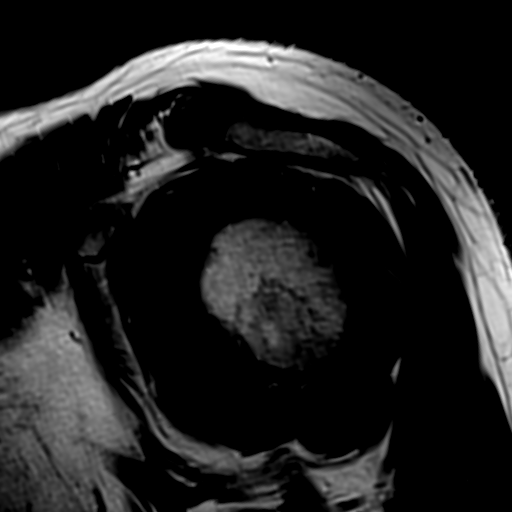
[im 15/24]
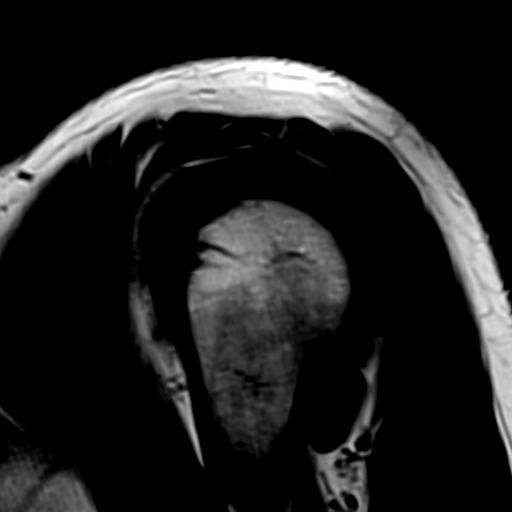
[im 18/24]
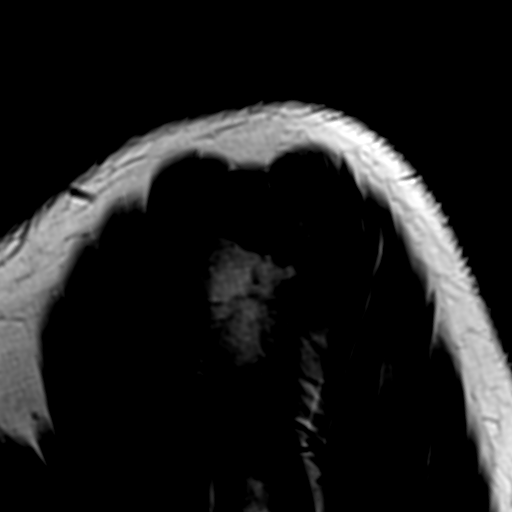
[im 21/24]
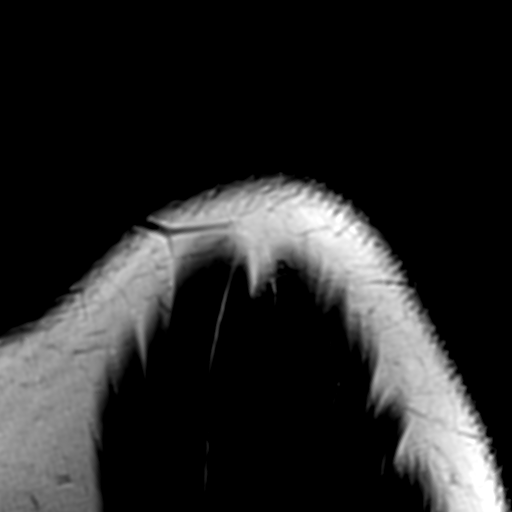
[im 24/24]
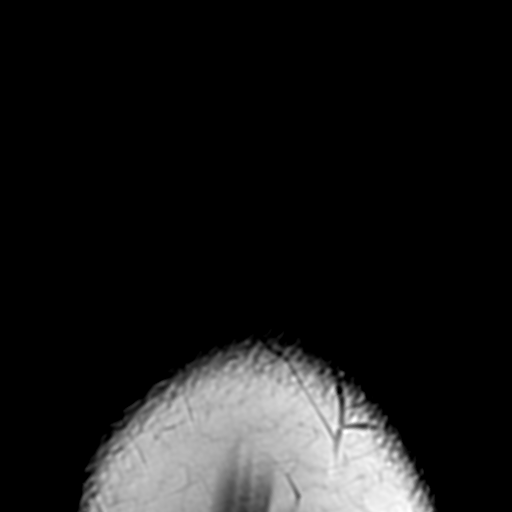

[Series 5: t2fs sagital · oblique · 4.0mm · 0.18mm/px · 3 of 24 slices shown]
[im 3/24]
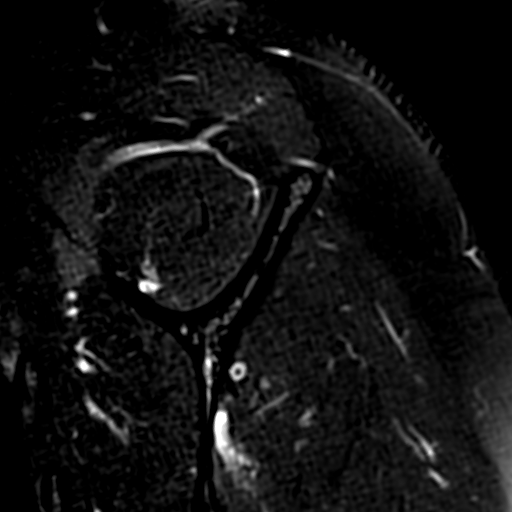
[im 12/24]
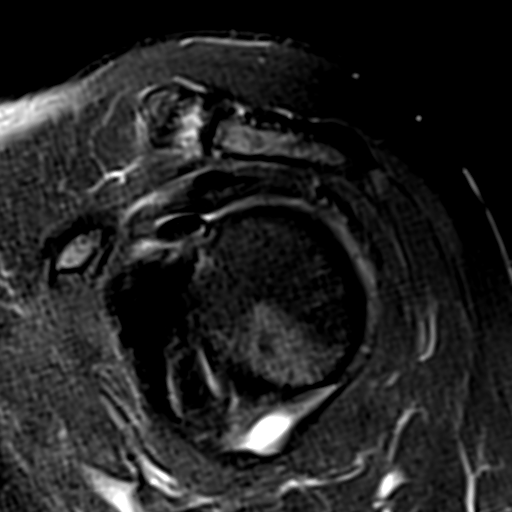
[im 21/24]
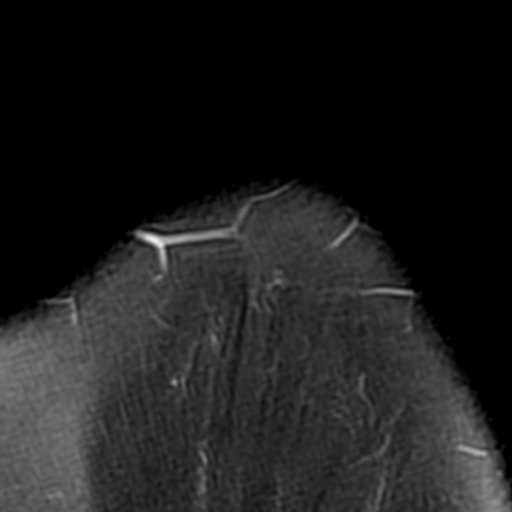

[Series 6: t2fs coronal · oblique · 4.0mm · 0.23mm/px · 3 of 17 slices shown]
[im 3/17]
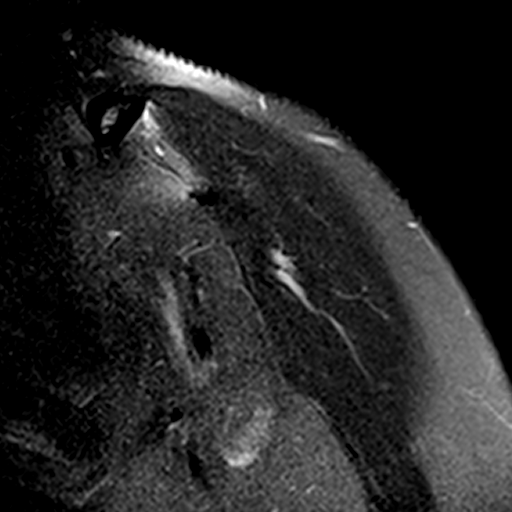
[im 9/17]
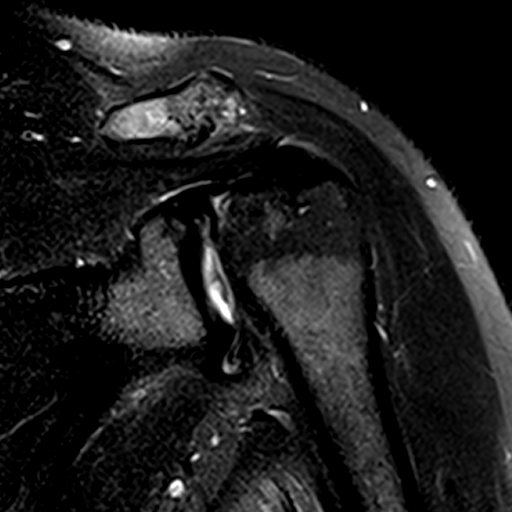
[im 14/17]
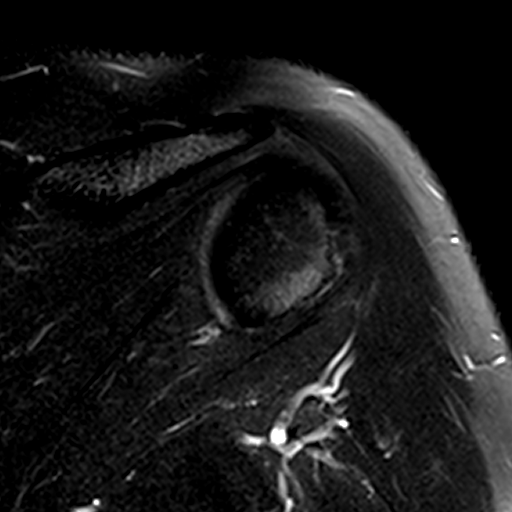

[19 of 40 positions shown; findings below may reference images not displayed]

FINDINGS: Rotator cuff: Minimal rotator cuff tendinopathy/tendinosis. No
partial or full-thickness tear.

Muscles:  Normal

Biceps long head:  Intact

Acromioclavicular Joint: Mild to moderate degenerative changes for
age with mild pannus formation. Type 1-2 acromion. No significant
lateral downsloping or undersurface spurring.

Glenohumeral Joint: No degenerative changes. Small joint effusion.
There is thickening of the capsular structures in the axillary
recess which can be seen with adhesive capsulitis or synovitis.

Labrum: Posterior labral tear suspected. The anterior and superior
labrum appear normal.

Bones:  No acute bony findings.

Other: No subacromial/subdeltoid fluid collections to suggest
bursitis.
IMPRESSION: 1. Minimal rotator cuff tendinopathy/tendinosis. No partial or
full-thickness tear.
2. Intact long head biceps tendon.
3. Posterior labral tear. The anterior and superior labrum appear
normal.
4. Mild to moderate AC joint degenerative changes but no other
significant findings for bony impingement.
5. Glenohumeral joint effusion. There is thickening of the capsular
structures in the axillary recess which can be seen with adhesive
capsulitis or synovitis.

## 2019-12-11 ENCOUNTER — Encounter: Payer: Self-pay | Admitting: Internal Medicine

## 2019-12-18 ENCOUNTER — Other Ambulatory Visit (HOSPITAL_COMMUNITY)
Admission: RE | Admit: 2019-12-18 | Discharge: 2019-12-18 | Disposition: A | Discharge status: Home / Self Care | Payer: Medicare Other | Source: Ambulatory Visit | Attending: Internal Medicine | Admitting: Internal Medicine

## 2019-12-18 ENCOUNTER — Other Ambulatory Visit: Payer: Self-pay

## 2019-12-18 DIAGNOSIS — Z01812 Encounter for preprocedural laboratory examination: Secondary | ICD-10-CM | POA: Insufficient documentation

## 2019-12-18 DIAGNOSIS — Z20822 Contact with and (suspected) exposure to covid-19: Secondary | ICD-10-CM | POA: Diagnosis not present

## 2019-12-18 LAB — SARS CORONAVIRUS 2 (TAT 6-24 HRS): SARS Coronavirus 2: NEGATIVE

## 2019-12-19 ENCOUNTER — Encounter (HOSPITAL_COMMUNITY): Payer: Self-pay | Admitting: Internal Medicine

## 2019-12-19 ENCOUNTER — Ambulatory Visit (HOSPITAL_COMMUNITY): Payer: Medicare Other | Admitting: Anesthesiology

## 2019-12-19 ENCOUNTER — Other Ambulatory Visit: Payer: Self-pay

## 2019-12-19 ENCOUNTER — Encounter (HOSPITAL_COMMUNITY)
Admission: RE | Disposition: A | Discharge status: Home / Self Care | Payer: Self-pay | Source: Home / Self Care | Attending: Internal Medicine

## 2019-12-19 ENCOUNTER — Ambulatory Visit (HOSPITAL_COMMUNITY)
Admission: RE | Admit: 2019-12-19 | Discharge: 2019-12-19 | Disposition: A | Discharge status: Home / Self Care | Payer: Medicare Other | Attending: Internal Medicine | Admitting: Internal Medicine

## 2019-12-19 DIAGNOSIS — I10 Essential (primary) hypertension: Secondary | ICD-10-CM | POA: Insufficient documentation

## 2019-12-19 DIAGNOSIS — F410 Panic disorder [episodic paroxysmal anxiety] without agoraphobia: Secondary | ICD-10-CM | POA: Diagnosis not present

## 2019-12-19 DIAGNOSIS — R011 Cardiac murmur, unspecified: Secondary | ICD-10-CM | POA: Diagnosis not present

## 2019-12-19 DIAGNOSIS — R569 Unspecified convulsions: Secondary | ICD-10-CM | POA: Insufficient documentation

## 2019-12-19 DIAGNOSIS — M545 Low back pain: Secondary | ICD-10-CM | POA: Insufficient documentation

## 2019-12-19 DIAGNOSIS — K642 Third degree hemorrhoids: Secondary | ICD-10-CM | POA: Diagnosis not present

## 2019-12-19 DIAGNOSIS — R1310 Dysphagia, unspecified: Secondary | ICD-10-CM

## 2019-12-19 DIAGNOSIS — F419 Anxiety disorder, unspecified: Secondary | ICD-10-CM | POA: Diagnosis not present

## 2019-12-19 DIAGNOSIS — M1990 Unspecified osteoarthritis, unspecified site: Secondary | ICD-10-CM | POA: Diagnosis not present

## 2019-12-19 DIAGNOSIS — R000 Tachycardia, unspecified: Secondary | ICD-10-CM | POA: Insufficient documentation

## 2019-12-19 DIAGNOSIS — K921 Melena: Secondary | ICD-10-CM | POA: Insufficient documentation

## 2019-12-19 DIAGNOSIS — Z800 Family history of malignant neoplasm of digestive organs: Secondary | ICD-10-CM | POA: Diagnosis not present

## 2019-12-19 DIAGNOSIS — Z87891 Personal history of nicotine dependence: Secondary | ICD-10-CM | POA: Insufficient documentation

## 2019-12-19 DIAGNOSIS — K317 Polyp of stomach and duodenum: Secondary | ICD-10-CM | POA: Diagnosis not present

## 2019-12-19 DIAGNOSIS — K219 Gastro-esophageal reflux disease without esophagitis: Secondary | ICD-10-CM | POA: Insufficient documentation

## 2019-12-19 DIAGNOSIS — Z8379 Family history of other diseases of the digestive system: Secondary | ICD-10-CM | POA: Insufficient documentation

## 2019-12-19 DIAGNOSIS — G4730 Sleep apnea, unspecified: Secondary | ICD-10-CM | POA: Insufficient documentation

## 2019-12-19 DIAGNOSIS — K635 Polyp of colon: Secondary | ICD-10-CM | POA: Insufficient documentation

## 2019-12-19 DIAGNOSIS — Z8249 Family history of ischemic heart disease and other diseases of the circulatory system: Secondary | ICD-10-CM | POA: Diagnosis not present

## 2019-12-19 DIAGNOSIS — K5909 Other constipation: Secondary | ICD-10-CM | POA: Insufficient documentation

## 2019-12-19 DIAGNOSIS — Z8261 Family history of arthritis: Secondary | ICD-10-CM | POA: Insufficient documentation

## 2019-12-19 DIAGNOSIS — Z833 Family history of diabetes mellitus: Secondary | ICD-10-CM | POA: Insufficient documentation

## 2019-12-19 DIAGNOSIS — R262 Difficulty in walking, not elsewhere classified: Secondary | ICD-10-CM | POA: Diagnosis not present

## 2019-12-19 DIAGNOSIS — Z809 Family history of malignant neoplasm, unspecified: Secondary | ICD-10-CM | POA: Diagnosis not present

## 2019-12-19 DIAGNOSIS — Z79899 Other long term (current) drug therapy: Secondary | ICD-10-CM | POA: Diagnosis not present

## 2019-12-19 DIAGNOSIS — R519 Headache, unspecified: Secondary | ICD-10-CM | POA: Diagnosis not present

## 2019-12-19 DIAGNOSIS — K644 Residual hemorrhoidal skin tags: Secondary | ICD-10-CM | POA: Insufficient documentation

## 2019-12-19 HISTORY — PX: COLONOSCOPY WITH PROPOFOL: SHX5780

## 2019-12-19 HISTORY — PX: ESOPHAGOGASTRODUODENOSCOPY (EGD) WITH PROPOFOL: SHX5813

## 2019-12-19 HISTORY — PX: POLYPECTOMY: SHX5525

## 2019-12-19 HISTORY — PX: MALONEY DILATION: SHX5535

## 2019-12-19 SURGERY — COLONOSCOPY WITH PROPOFOL
Anesthesia: General

## 2019-12-19 MED ORDER — LACTATED RINGERS IV SOLN: Freq: Once | INTRAVENOUS | Status: AC

## 2019-12-19 MED ORDER — PROPOFOL 500 MG/50ML IV EMUL
INTRAVENOUS | Status: DC | PRN
  Administered 2019-12-19: 200 ug/kg/min via INTRAVENOUS

## 2019-12-19 MED ORDER — STERILE WATER FOR IRRIGATION IR SOLN
Status: DC | PRN
  Administered 2019-12-19: 100 mL

## 2019-12-19 MED ORDER — CHLORHEXIDINE GLUCONATE CLOTH 2 % EX PADS: 6.0000 | MEDICATED_PAD | Freq: Once | CUTANEOUS | Status: DC

## 2019-12-19 MED ORDER — LIDOCAINE VISCOUS HCL 2 % MT SOLN
15.0000 mL | Freq: Once | OROMUCOSAL | Status: AC
  Administered 2019-12-19: 15 mL via OROMUCOSAL

## 2019-12-19 MED ORDER — LACTATED RINGERS IV SOLN: INTRAVENOUS | Status: DC

## 2019-12-19 MED ORDER — GLYCOPYRROLATE 0.2 MG/ML IJ SOLN
0.2000 mg | Freq: Once | INTRAMUSCULAR | Status: AC
  Administered 2019-12-19: 0.2 mg via INTRAVENOUS

## 2019-12-19 MED ORDER — LACTATED RINGERS IV SOLN: INTRAVENOUS | Status: DC | PRN

## 2019-12-19 MED ORDER — GLYCOPYRROLATE 0.2 MG/ML IJ SOLN
INTRAMUSCULAR | Status: AC
  Filled 2019-12-19: qty 1

## 2019-12-19 MED ORDER — PROPOFOL 10 MG/ML IV BOLUS
INTRAVENOUS | Status: DC | PRN
  Administered 2019-12-19 (×2): 50 mg via INTRAVENOUS

## 2019-12-19 MED ORDER — LIDOCAINE VISCOUS HCL 2 % MT SOLN
OROMUCOSAL | Status: AC
  Filled 2019-12-19: qty 15

## 2019-12-19 NOTE — Anesthesia Preprocedure Evaluation (Addendum)
Anesthesia Evaluation  Patient identified by MRN, date of birth, ID band Patient awake    Reviewed: Allergy & Precautions, NPO status , Patient's Chart, lab work & pertinent test results  History of Anesthesia Complications Negative for: history of anesthetic complications  Airway Mallampati: II  TM Distance: >3 FB Neck ROM: Full    Dental  (+) Teeth Intact, Dental Advisory Given   Pulmonary sleep apnea (noncomplaint) and Continuous Positive Airway Pressure Ventilation , former smoker,    Pulmonary exam normal breath sounds clear to auscultation- rhonchi (-) decreased breath sounds(-) wheezing (-) stridorstable (-) rales(-) intubated    Cardiovascular Exercise Tolerance: Good hypertension, Pt. on medications  Rhythm:Regular Rate:Tachycardia - Systolic murmurs, - Diastolic murmurs, - Friction Rub, - Carotid Bruit, - Peripheral Edema and - Systolic Click Sinus rhythm Right axis deviation Low voltage, precordial leads Borderline T abnormalities, anterior leads No significant change since last tracing Confirmed by Fredia Sorrow [phone]) on 07/29/2019 8:26:59 AM   Neuro/Psych  Headaches, Seizures -, Well Controlled,  PSYCHIATRIC DISORDERS Anxiety    GI/Hepatic Neg liver ROS, GERD  Medicated and Controlled,  Endo/Other  negative endocrine ROS  Renal/GU negative Renal ROS  negative genitourinary   Musculoskeletal  (+) Arthritis , Difficulty walking, low back pain   Abdominal   Peds  Hematology negative hematology ROS (+)   Anesthesia Other Findings   Reproductive/Obstetrics                            Anesthesia Physical Anesthesia Plan  ASA: III  Anesthesia Plan: General   Post-op Pain Management:    Induction: Intravenous  PONV Risk Score and Plan: TIVA  Airway Management Planned: Nasal Cannula and Natural Airway  Additional Equipment:   Intra-op Plan:   Post-operative Plan:    Informed Consent: I have reviewed the patients History and Physical, chart, labs and discussed the procedure including the risks, benefits and alternatives for the proposed anesthesia with the patient or authorized representative who has indicated his/her understanding and acceptance.     Dental advisory given  Plan Discussed with: CRNA and Surgeon  Anesthesia Plan Comments:        Anesthesia Quick Evaluation

## 2019-12-19 NOTE — OR Nursing (Signed)
Heart rate 71, bp 54/38 patient stated that she felt like she was going to pass out was clammy.  Oxygen applied at 2 liters, trendelenburg position, fluids opened wide per [place]. Nurse and doctor at bedside. At 1135 Bp 86/64 patient feeling less dizzy and less clammy

## 2019-12-19 NOTE — H&P (Signed)
@LOGO @   Primary Care Physician:  Celene Squibb, MD Primary Gastroenterologist:  Dr. Gala Romney  Pre-Procedure History & Physical: HPI:  [name] is a 49 y.o. female here for further evaluation of rectal bleeding via colonoscopy.  Also recurrent dysphagia multiple ER esophageal dilations previously in the setting of a normal-appearing esophagus.  Reflux well controlled on Protonix 40 mg daily. Past Medical History:  Diagnosis Date  . Anxiety   . Arthritis   . Balance problems   . Chronic abdominal pain   . Chronic knee pain   . Constipation   . Difficulty walking   . GERD (gastroesophageal reflux disease)   . Headache   . HTN (hypertension)   . Low back pain   . Panic attacks   . Polyuria   . Seizures ([place])    unknown etiology; on dilantin; last seizures was a few years ago.  . Sleep apnea     Past Surgical History:  Procedure Laterality Date  . BACK SURGERY     complicated by ureteral injury  . CHONDROPLASTY Right 08/31/2016   Procedure: CHONDROPLASTY RIGHT FEMUR;  Surgeon: Carole Civil, MD;  Location: [facility];  Service: Orthopedics;  Laterality: Right;  . ESOPHAGOGASTRODUODENOSCOPY N/A 09/11/2015   Procedure: ESOPHAGOGASTRODUODENOSCOPY (EGD);  Surgeon: Daneil Dolin, MD;  Normal esophagus s/p dilation, few gastric polyps resected and retrieved, normal duodenum. Gastric polyps benign.   . ESOPHAGOGASTRODUODENOSCOPY (EGD) WITH ESOPHAGEAL DILATION N/A 03/20/2013   Dr. Earlyne Feeser:Normal EGD-status post passage of a Maloney dilator/Status post esophageal biopsy., benign path  . KNEE ARTHROSCOPY WITH MEDIAL MENISECTOMY Right 08/31/2016   Procedure: KNEE ARTHROSCOPY WITH MEDIAL MENISECTOMY;  Surgeon: Carole Civil, MD;  Location: [facility];  Service: Orthopedics;  Laterality: Right;  . KNEE ARTHROSCOPY WITH MEDIAL MENISECTOMY Right 09/13/2018   Procedure: KNEE ARTHROSCOPY WITH MEDIAL MENISCECTOMY AND MICROFRACTURE;  Surgeon: Carole Civil, MD;  Location: [facility];   Service: Orthopedics;  Laterality: Right;  . MALONEY DILATION N/A 09/11/2015   Procedure: Venia Minks DILATION;  Surgeon: Daneil Dolin, MD;  Location: [facility];  Service: Endoscopy;  Laterality: N/A;    Prior to Admission medications   Medication Sig Start Date End Date Taking? Authorizing Provider  ALPRAZolam Duanne Moron) 0.5 MG tablet Take 0.5 mg by mouth 2 (two) times daily as needed for anxiety.  04/30/14  Yes [provider]  amLODipine (NORVASC) 10 MG tablet Take 10 mg by mouth daily at 2 PM.    Yes [provider]  DULoxetine (CYMBALTA) 30 MG capsule Take 30 mg by mouth daily. 08/21/19  Yes [provider]  estradiol (ESTRACE) 0.5 MG tablet TAKE 1 TABLET BY MOUTH DAILY Patient taking differently: Take 0.5 mg by mouth daily.  05/20/19  Yes Florian Buff, MD  fluticasone (FLONASE) 50 MCG/ACT nasal spray Place 2 sprays into both nostrils daily. 11/11/19  Yes [provider]  irbesartan (AVAPRO) 300 MG tablet Take 300 mg by mouth daily at 2 PM.  07/03/14  Yes [provider]  LINZESS 290 MCG CAPS capsule TAKE (1) CAPSULE BY MOUTH ONCE DAILY BEFORE BREAKFAST. Patient taking differently: Take 290 mcg by mouth daily before breakfast.  02/12/19  Yes Erenest Rasher, PA-C  MYRBETRIQ 25 MG TB24 tablet Take 25 mg by mouth daily at 2 PM.  10/20/15  Yes [provider]  Omega-3 Fatty Acids (FISH OIL) 1000 MG CAPS Take 2,000 mg by mouth at bedtime.    Yes [provider]  ondansetron [facility])  4 MG tablet Take 1 tablet (4 mg total) by mouth every 6 (six) hours. Patient taking differently: Take 4 mg by mouth every 6 (six) hours as needed for nausea or vomiting.  11/10/19  Yes [place], [place], PA-C  oxyCODONE-acetaminophen (PERCOCET/ROXICET) 5-325 MG tablet Take 1 tablet by mouth every 4 (four) hours as needed for severe pain (pain.).    Yes [provider]  pantoprazole (PROTONIX) 40 MG tablet TAKE 1 TABLET TWICE DAILY BEFORE A MEAL. 01/09/19   Yes Carlis Stable, NP  phenytoin (DILANTIN) 100 MG ER capsule Take 200 mg by mouth in the morning and at bedtime.    Yes [provider]  Potassium 99 MG TABS Take 99 mg by mouth 3 (three) times a week.   Yes [provider]  Probiotic Product (PROBIOTIC-10 PO) Take 1 capsule by mouth daily.   Yes [provider]  progesterone (PROMETRIUM) 100 MG capsule Take 100 mg by mouth See admin instructions. Take 1 capsule (100 mg) by mouth daily x 14 days every 3 months 09/11/19  Yes [provider]    Allergies as of 11/08/2019 - Review Complete 08/29/2019  Allergen Reaction Noted  . Benadryl [diphenhydramine hcl] Rash 01/17/2012    Family History  Problem Relation Age of Onset  . Throat cancer Father   . Heart disease Father   . Cancer Father   . Hypertension Father   . Diabetes Father   . Other Mother        esophageal stricture and gallbladder disease  . Thyroid disease Mother   . Other Sister        esophageal stricture and gallbladder problems.   . Diabetes Sister   . Hypertension Sister   . Thyroid disease Sister   . Heart disease Maternal Grandfather   . Diabetes Maternal Grandfather   . Arthritis Maternal Grandfather   . Colon cancer Neg Hx   . Liver disease Neg Hx   . Pancreatic cancer Neg Hx     Social History   Socioeconomic History  . Marital status: Single    Spouse name: Not on file  . Number of children: 0  . Years of education: 76  . Highest education level: Not on file  Occupational History  . Occupation: disability    Employer: DISABLED  Tobacco Use  . Smoking status: Former Smoker    Packs/day: 2.00    Years: 9.00    Pack years: 18.00    Types: Cigarettes    Quit date: 04/04/1994    Years since quitting: 25.7  . Smokeless tobacco: Never Used  . Tobacco comment: smoked 8 years, quit 18 years ago in the 1996  Vaping Use  . Vaping Use: Never used  Substance and Sexual Activity  . Alcohol use: No    Alcohol/week: 0.0  standard drinks  . Drug use: No  . Sexual activity: Not Currently    Birth control/protection: None  Other Topics Concern  . Not on file  Social History Narrative  . Not on file   Social Determinants of Health   Financial Resource Strain:   . Difficulty of Paying Living Expenses: Not on file  Food Insecurity:   . Worried About Charity fundraiser in the Last Year: Not on file  . Ran Out of Food in the Last Year: Not on file  Transportation Needs:   . Lack of Transportation (Medical): Not on file  . Lack of Transportation (Non-Medical): Not on file  Physical Activity:   .  Days of Exercise per Week: Not on file  . Minutes of Exercise per Session: Not on file  Stress:   . Feeling of Stress : Not on file  Social Connections:   . Frequency of Communication with Friends and Family: Not on file  . Frequency of Social Gatherings with Friends and Family: Not on file  . Attends Religious Services: Not on file  . Active Member of Clubs or Organizations: Not on file  . Attends Archivist Meetings: Not on file  . Marital Status: Not on file  Intimate Partner Violence:   . Fear of Current or Ex-Partner: Not on file  . Emotionally Abused: Not on file  . Physically Abused: Not on file  . Sexually Abused: Not on file    Review of Systems: See HPI, otherwise negative ROS  Physical Exam: BP 98/77   Pulse (!) 106   Temp 98.5 F (36.9 C) (Oral)   Resp 11   Ht 5' 6"  (1.676 m)   LMP 07/06/2018   SpO2 96%   BMI 28.25 kg/m  General:   Alert,  Well-developed, well-nourished, pleasant and cooperative in NAD Neck:  Supple; no masses or thyromegaly. No significant cervical adenopathy. Lungs:  Clear throughout to auscultation.   No wheezes, crackles, or rhonchi. No acute distress. Heart:  Regular rate and rhythm; no murmurs, clicks, rubs,  or gallops. Abdomen: Non-distended, normal bowel sounds.  Soft and nontender without appreciable mass or hepatosplenomegaly.  Pulses:  Normal  pulses noted. Extremities:  Without clubbing or edema.  Impression/Plan: 49 year old lady with recurrent esophageal dysphagia background of GERD.  Intermittent rectal bleeding as well.  Chronic constipation on Linzess.  Was doing well Linzess 145 but when she went to get her medication refilled it was filled with 290 which causes diarrhea.  EGD with possible esophageal dilation and colonoscopy today per plan.  The risks, benefits, limitations, imponderables and alternatives regarding both EGD and colonoscopy have been reviewed with the patient. Questions have been answered. All parties agreeable.     Notice: This dictation was prepared with Dragon dictation along with smaller phrase technology. Any transcriptional errors that result from this process are unintentional and may not be corrected upon review.

## 2019-12-19 NOTE — Discharge Instructions (Signed)
EGD Discharge instructions Please read the instructions outlined below and refer to this sheet in the next few weeks. These discharge instructions provide you with general information on caring for yourself after you leave the hospital. Your doctor may also give you specific instructions. While your treatment has been planned according to the most current medical practices available, unavoidable complications occasionally occur. If you have any problems or questions after discharge, please call your doctor. ACTIVITY  You may resume your regular activity but move at a slower pace for the next 24 hours.   Take frequent rest periods for the next 24 hours.   Walking will help expel (get rid of) the air and reduce the bloated feeling in your abdomen.   No driving for 24 hours (because of the anesthesia (medicine) used during the test).   You may shower.   Do not sign any important legal documents or operate any machinery for 24 hours (because of the anesthesia used during the test).  NUTRITION  Drink plenty of fluids.   You may resume your normal diet.   Begin with a light meal and progress to your normal diet.   Avoid alcoholic beverages for 24 hours or as instructed by your caregiver.  MEDICATIONS  You may resume your normal medications unless your caregiver tells you otherwise.  WHAT YOU CAN EXPECT TODAY  You may experience abdominal discomfort such as a feeling of fullness or gas pains.  FOLLOW-UP  Your doctor will discuss the results of your test with you.  SEEK IMMEDIATE MEDICAL ATTENTION IF ANY OF THE FOLLOWING OCCUR:  Excessive nausea (feeling sick to your stomach) and/or vomiting.   Severe abdominal pain and distention (swelling).   Trouble swallowing.   Temperature over 101 F (37.8 C).   Rectal bleeding or vomiting of blood.    Colonoscopy Discharge Instructions  Read the instructions outlined below and refer to this sheet in the next few weeks. These  discharge instructions provide you with general information on caring for yourself after you leave the hospital. Your doctor may also give you specific instructions. While your treatment has been planned according to the most current medical practices available, unavoidable complications occasionally occur. If you have any problems or questions after discharge, call Dr. Gala Romney at [phone]. ACTIVITY  You may resume your regular activity, but move at a slower pace for the next 24 hours.   Take frequent rest periods for the next 24 hours.   Walking will help get rid of the air and reduce the bloated feeling in your belly (abdomen).   No driving for 24 hours (because of the medicine (anesthesia) used during the test).    Do not sign any important legal documents or operate any machinery for 24 hours (because of the anesthesia used during the test).  NUTRITION  Drink plenty of fluids.   You may resume your normal diet as instructed by your doctor.   Begin with a light meal and progress to your normal diet. Heavy or fried foods are harder to digest and may make you feel sick to your stomach (nauseated).   Avoid alcoholic beverages for 24 hours or as instructed.  MEDICATIONS  You may resume your normal medications unless your doctor tells you otherwise.  WHAT YOU CAN EXPECT TODAY  Some feelings of bloating in the abdomen.   Passage of more gas than usual.   Spotting of blood in your stool or on the toilet paper.  IF YOU HAD POLYPS REMOVED DURING THE  COLONOSCOPY:  No aspirin products for 7 days or as instructed.   No alcohol for 7 days or as instructed.   Eat a soft diet for the next 24 hours.  FINDING OUT THE RESULTS OF YOUR TEST Not all test results are available during your visit. If your test results are not back during the visit, make an appointment with your caregiver to find out the results. Do not assume everything is normal if you have not heard from your caregiver or the  medical facility. It is important for you to follow up on all of your test results.  SEEK IMMEDIATE MEDICAL ATTENTION IF:  You have more than a spotting of blood in your stool.   Your belly is swollen (abdominal distention).   You are nauseated or vomiting.   You have a temperature over 101.   You have abdominal pain or discomfort that is severe or gets worse throughout the day.   3 polyps removed from your colon today  Your esophagus was stretched today  Further recommendations to follow pending review of pathology report  Continue Linzess 145 daily-prescription provided  Office visit with [place] in 3 months  At patient request I called Dene Gentry at [phone] -reviewed results and recommendations     Monitored Anesthesia Care, Care After These instructions provide you with information about caring for yourself after your procedure. Your health care provider may also give you more specific instructions. Your treatment has been planned according to current medical practices, but problems sometimes occur. Call your health care provider if you have any problems or questions after your procedure. What can I expect after the procedure? After your procedure, you may:  Feel sleepy for several hours.  Feel clumsy and have poor balance for several hours.  Feel forgetful about what happened after the procedure.  Have poor judgment for several hours.  Feel nauseous or vomit.  Have a sore throat if you had a breathing tube during the procedure. Follow these instructions at home: For at least 24 hours after the procedure:      Have a responsible adult stay with you. It is important to have someone help care for you until you are awake and alert.  Rest as needed.  Do not: ? Participate in activities in which you could fall or become injured. ? Drive. ? Use heavy machinery. ? Drink alcohol. ? Take sleeping pills or medicines that cause drowsiness. ? Make important  decisions or sign legal documents. ? Take care of children on your own. Eating and drinking  Follow the diet that is recommended by your health care provider.  If you vomit, drink water, juice, or soup when you can drink without vomiting.  Make sure you have little or no nausea before eating solid foods. General instructions  Take over-the-counter and prescription medicines only as told by your health care provider.  If you have sleep apnea, surgery and certain medicines can increase your risk for breathing problems. Follow instructions from your health care provider about wearing your sleep device: ? Anytime you are sleeping, including during daytime naps. ? While taking prescription pain medicines, sleeping medicines, or medicines that make you drowsy.  If you smoke, do not smoke without supervision.  Keep all follow-up visits as told by your health care provider. This is important. Contact a health care provider if:  You keep feeling nauseous or you keep vomiting.  You feel light-headed.  You develop a rash.  You have a fever. Get help  right away if:  You have trouble breathing. Summary  For several hours after your procedure, you may feel sleepy and have poor judgment.  Have a responsible adult stay with you for at least 24 hours or until you are awake and alert. This information is not intended to replace advice given to you by your health care provider. Make sure you discuss any questions you have with your health care provider. Document Revised: 06/19/2017 Document Reviewed: 07/12/2015 Elsevier Patient Education  [place].

## 2019-12-19 NOTE — Op Note (Signed)
[facility] Patient Name: [name] Procedure Date: 12/19/2019 12:11 PM MRN: [id number] Date of Birth: 21-May-1970 Attending MD: Norvel Richards , MD CSN: [id number] Age: 49 Admit Type: Outpatient Procedure:                Colonoscopy Indications:              Hematochezia Providers:                Norvel Richards, MD, [place] Page, [place]                            Risa Grill, Technician, Aram Candela Referring MD:              Medicines:                Propofol per Anesthesia Complications:            No immediate complications. Estimated Blood Loss:     Estimated blood loss was minimal. Procedure:                Pre-Anesthesia Assessment:                           - Prior to the procedure, a History and Physical                            was performed, and patient medications and                            allergies were reviewed. The patient's tolerance of                            previous anesthesia was also reviewed. The risks                            and benefits of the procedure and the sedation                            options and risks were discussed with the patient.                            All questions were answered, and informed consent                            was obtained. Prior Anticoagulants: The patient has                            taken no previous anticoagulant or antiplatelet                            agents. ASA Grade Assessment: III - A patient with                            severe systemic disease. After reviewing the risks  and benefits, the patient was deemed in                            satisfactory condition to undergo the procedure.                           After obtaining informed consent, the colonoscope                            was passed under direct vision. Throughout the                            procedure, the patient's blood pressure, pulse, and                             oxygen saturations were monitored continuously. The                            CF-[id number] ([id number]) scope was introduced through                            the anus and advanced to the the cecum, identified                            by appendiceal orifice and ileocecal valve. The                            colonoscopy was performed without difficulty. The                            patient tolerated the procedure well. The quality                            of the bowel preparation was adequate. Scope In: 12:14:31 PM Scope Out: 12:24:59 PM Scope Withdrawal Time: 0 hours 7 minutes 13 seconds  Total Procedure Duration: 0 hours 10 minutes 28 seconds  Findings:      Hemorrhoids were found on perianal exam.      Non-bleeding external and internal hemorrhoids were found during       retroflexion. The hemorrhoids were moderate, medium-sized and Grade III       (internal hemorrhoids that prolapse but require manual reduction).      Three sessile polyps were found in the sigmoid colon. The polyps were 2       to 8 mm in size. These polyps were removed with a cold snare. Resection       and retrieval were complete. Estimated blood loss was minimal.      The exam was otherwise without abnormality on direct and retroflexion       views. Impression:               - Hemorrhoids found on perianal exam.                           - Non-bleeding external and internal hemorrhoids.                           -  Three 2 to 8 mm polyps in the sigmoid colon,                            removed with a cold snare. Resected and retrieved.                           - The examination was otherwise normal on direct                            and retroflexion views. I suspect anorectal                            bleeding in the setting of constipation. Linzess                            145 daily worked very well for this nice lady.                            Linzess 290 was too much. Moderate Sedation:      Moderate  (conscious) sedation was personally administered by an       anesthesia professional. The following parameters were monitored: oxygen       saturation, heart rate, blood pressure, respiratory rate, EKG, adequacy       of pulmonary ventilation, and response to care. Recommendation:           - Patient has a contact number available for                            emergencies. The signs and symptoms of potential                            delayed complications were discussed with the                            patient. Return to normal activities tomorrow.                            Written discharge instructions were provided to the                            patient.                           - Advance diet as tolerated.                           - Continue present medications. Continue Linzess                            145 daily                           - Repeat colonoscopy date to be determined after  pending pathology results are reviewed for                            surveillance based on pathology results.                           - Return to GI office in 3 months. See EGD report Procedure Code(s):        --- Professional ---                           [phone], Colonoscopy, flexible; with removal of                            tumor(s), polyp(s), or other lesion(s) by snare                            technique Diagnosis Code(s):        --- Professional ---                           K64.2, Third degree hemorrhoids                           K63.5, Polyp of colon                           K92.1, Melena (includes Hematochezia) CPT copyright 2019 American Medical Association. All rights reserved. The codes documented in this report are preliminary and upon coder review may  be revised to meet current compliance requirements. [name] [name], MD Norvel Richards, MD 12/19/2019 12:37:20 PM This report has been signed electronically. Number of Addenda: 0

## 2019-12-19 NOTE — Transfer of Care (Signed)
Immediate Anesthesia Transfer of Care Note  Patient: Zeynep Fantroy Holton  Procedure(s) Performed: COLONOSCOPY WITH PROPOFOL (N/A ) ESOPHAGOGASTRODUODENOSCOPY (EGD) WITH PROPOFOL (N/A ) MALONEY DILATION (N/A ) POLYPECTOMY  Patient Location: PACU  Anesthesia Type:General  Level of Consciousness: awake, alert , oriented and patient cooperative  Airway & Oxygen Therapy: Patient Spontanous Breathing and Patient connected to nasal cannula oxygen  Post-op Assessment: Report given to RN, Post -op Vital signs reviewed and stable and Patient moving all extremities X 4  Post vital signs: Reviewed and stable  Last Vitals:  Vitals Value Taken Time  BP 108/84 12/19/19 1231  Temp    Pulse 94 12/19/19 1236  Resp 15 12/19/19 1236  SpO2 94 % 12/19/19 1236  Vitals shown include unvalidated device data.  Last Pain:  Vitals:   12/19/19 1158  TempSrc:   PainSc: 0-No pain      Patients Stated Pain Goal: 8 (99/87/21 5872)  Complications: No complications documented.

## 2019-12-19 NOTE — Op Note (Signed)
[facility] Patient Name: [name] Procedure Date: 12/19/2019 11:08 AM MRN: [id number] Date of Birth: [birth year] Attending MD: Norvel Richards , MD CSN: [id number] Age: 49 Admit Type: Outpatient Procedure:                Upper GI endoscopy Indications:              Dysphagia Providers:                Norvel Richards, MD, Crystal Page, [place]                            Risa Grill, Technician, Aram Candela Referring MD:              Medicines:                Propofol per Anesthesia Complications:            No immediate complications. Estimated Blood Loss:     Estimated blood loss was minimal. Estimated blood                            loss: none. Procedure:                Pre-Anesthesia Assessment:                           - Prior to the procedure, a History and Physical                            was performed, and patient medications and                            allergies were reviewed. The patient's tolerance of                            previous anesthesia was also reviewed. The risks                            and benefits of the procedure and the sedation                            options and risks were discussed with the patient.                            All questions were answered, and informed consent                            was obtained. Prior Anticoagulants: The patient has                            taken no previous anticoagulant or antiplatelet                            agents. ASA Grade Assessment: III - A patient with  severe systemic disease. After reviewing the risks                            and benefits, the patient was deemed in                            satisfactory condition to undergo the procedure.                           After obtaining informed consent, the endoscope was                            passed under direct vision. Throughout the                            procedure, the patient's  blood pressure, pulse, and                            oxygen saturations were monitored continuously. The                            [id number]-H190 ([id number]) scope was introduced through the                            mouth, and advanced to the second part of duodenum.                            The upper GI endoscopy was accomplished without                            difficulty. The patient tolerated the procedure                            well. Scope In: 12:01:38 PM Scope Out: 12:10:23 PM Total Procedure Duration: 0 hours 8 minutes 45 seconds  Findings:      The examined esophagus was normal.      Multiple 3 to 5 mm fundal gland appearing polyps in the body of the       stomach (previously biopsied and proven to be benign fundic gland       polyps). The gastric mucosa otherwise appeared normal. Patent pylorus.      The duodenal bulb and second portion of the duodenum were normal. The       scope was withdrawn. Dilation was performed with a Maloney dilator with       mild resistance at 5 Fr. The scope was withdrawn. Dilation was       performed with a Maloney dilator with mild resistance at 56 Fr. The       dilation site was examined following endoscope reinsertion and showed no       change. Estimated blood loss: none. Impression:               - Normal esophagus. Status post [facility] dilation.                            Benign-appearing fundic gland  polyps.                           - Normal duodenal bulb and second portion of the                            duodenum.                           - No specimens collected. Moderate Sedation:      Moderate (conscious) sedation was personally administered by an       anesthesia professional. The following parameters were monitored: oxygen       saturation, heart rate, blood pressure, respiratory rate, EKG, adequacy       of pulmonary ventilation, and response to care. Recommendation:           - Patient has a contact number available for                             emergencies. The signs and symptoms of potential                            delayed complications were discussed with the                            patient. Return to normal activities tomorrow.                            Written discharge instructions were provided to the                            patient.                           - Advance diet as tolerated.                           - Continue present medications.                           - Return to GI office in 3 months. See colonoscopy                            report. Procedure Code(s):        --- Professional ---                           [phone], Esophagogastroduodenoscopy, flexible,                            transoral; diagnostic, including collection of                            specimen(s) by brushing or washing, when performed                            (separate procedure)  43450, Dilation of esophagus, by unguided sound or                            bougie, single or multiple passes Diagnosis Code(s):        --- Professional ---                           R13.10, Dysphagia, unspecified CPT copyright 2019 American Medical Association. All rights reserved. The codes documented in this report are preliminary and upon coder review may  be revised to meet current compliance requirements. [name] Maki Hege, MD Norvel Richards, MD 12/19/2019 12:33:50 PM This report has been signed electronically. Number of Addenda: 0

## 2019-12-19 NOTE — Anesthesia Postprocedure Evaluation (Signed)
Anesthesia Post Note  Patient: [name]  Procedure(s) Performed: COLONOSCOPY WITH PROPOFOL (N/A ) ESOPHAGOGASTRODUODENOSCOPY (EGD) WITH PROPOFOL (N/A ) MALONEY DILATION (N/A ) POLYPECTOMY  Patient location during evaluation: PACU Anesthesia Type: General Level of consciousness: awake, oriented, awake and alert and patient cooperative Pain management: satisfactory to patient Vital Signs Assessment: post-procedure vital signs reviewed and stable Respiratory status: spontaneous breathing, respiratory function stable, nonlabored ventilation and patient connected to nasal cannula oxygen Cardiovascular status: stable Postop Assessment: no apparent nausea or vomiting Anesthetic complications: no   No complications documented.   Last Vitals:  Vitals:   12/19/19 1150 12/19/19 1231  BP: 110/80 108/84  Pulse: (!) 103 98  Resp: 11 10  Temp:  (!) (P) 36.3 C  SpO2: 100% 98%    Last Pain:  Vitals:   12/19/19 1158  TempSrc:   PainSc: 0-No pain                 Jonthan Leite

## 2019-12-20 ENCOUNTER — Encounter: Payer: Self-pay | Admitting: Internal Medicine

## 2019-12-20 LAB — SURGICAL PATHOLOGY

## 2019-12-25 DIAGNOSIS — I10 Essential (primary) hypertension: Secondary | ICD-10-CM | POA: Diagnosis not present

## 2019-12-25 DIAGNOSIS — Z86010 Personal history of colonic polyps: Secondary | ICD-10-CM | POA: Diagnosis not present

## 2019-12-25 DIAGNOSIS — R55 Syncope and collapse: Secondary | ICD-10-CM | POA: Diagnosis not present

## 2019-12-25 DIAGNOSIS — I959 Hypotension, unspecified: Secondary | ICD-10-CM | POA: Diagnosis not present

## 2019-12-25 DIAGNOSIS — G4733 Obstructive sleep apnea (adult) (pediatric): Secondary | ICD-10-CM | POA: Diagnosis not present

## 2019-12-26 ENCOUNTER — Encounter (HOSPITAL_COMMUNITY): Payer: Self-pay | Admitting: Internal Medicine

## 2020-02-16 ENCOUNTER — Other Ambulatory Visit: Payer: Self-pay | Admitting: Internal Medicine

## 2020-02-17 DIAGNOSIS — R7303 Prediabetes: Secondary | ICD-10-CM | POA: Diagnosis not present

## 2020-02-17 DIAGNOSIS — I10 Essential (primary) hypertension: Secondary | ICD-10-CM | POA: Diagnosis not present

## 2020-02-17 DIAGNOSIS — I959 Hypotension, unspecified: Secondary | ICD-10-CM | POA: Diagnosis not present

## 2020-02-17 DIAGNOSIS — R55 Syncope and collapse: Secondary | ICD-10-CM | POA: Diagnosis not present

## 2020-02-17 DIAGNOSIS — Z712 Person consulting for explanation of examination or test findings: Secondary | ICD-10-CM | POA: Diagnosis not present

## 2020-02-17 DIAGNOSIS — G4733 Obstructive sleep apnea (adult) (pediatric): Secondary | ICD-10-CM | POA: Diagnosis not present

## 2020-02-20 ENCOUNTER — Other Ambulatory Visit: Payer: Self-pay | Admitting: Obstetrics & Gynecology

## 2020-02-20 DIAGNOSIS — G8929 Other chronic pain: Secondary | ICD-10-CM | POA: Diagnosis not present

## 2020-02-20 DIAGNOSIS — K219 Gastro-esophageal reflux disease without esophagitis: Secondary | ICD-10-CM | POA: Diagnosis not present

## 2020-02-20 DIAGNOSIS — E785 Hyperlipidemia, unspecified: Secondary | ICD-10-CM | POA: Diagnosis not present

## 2020-02-20 DIAGNOSIS — K5900 Constipation, unspecified: Secondary | ICD-10-CM | POA: Diagnosis not present

## 2020-02-20 DIAGNOSIS — I10 Essential (primary) hypertension: Secondary | ICD-10-CM | POA: Diagnosis not present

## 2020-03-18 NOTE — Progress Notes (Signed)
Referring Provider: Celene Squibb, MD Primary Care Physician:  Celene Squibb, MD Primary GI Physician: Dr. Gala Romney  Chief Complaint  Patient presents with  . Follow-up    F/U EGD/TCS    HPI:   [name] is a 49 y.o. female presenting today for follow-up of GERD, dysphagia, constipation, and rectal bleeding s/p EGD + TCS. History of GERD and dysphagia requiring dilation in 2014 in 2017.  Last seen via virtual visit in May and reported 1 to 2 weeks of intermittent rectal bleeding several weeks ago.  She is taking Linzess 290 mcg as needed and felt this dose was too strong.  Prior trial of Trulance was not helpful.  GERD well controlled on Protonix twice daily but had return of solid food and pill dysphagia for several weeks.  Plan to trial Linzess 145 mcg.  She was to come to the office to pick up samples.  Would also proceed with EGD and colonoscopy.  Procedures 12/19/2019: Colonoscopy: Nonbleeding external and internal grade 3 hemorrhoids, three 2-64m polyps, otherwise normal.  Suspected benign anorectal bleeding.  Pathology revealed hyperplastic polyps.  Repeat colonoscopy in 10 years. EGD: Normal esophagus s/p dilation, benign-appearing fundic gland polyps, normal examined duodenum.  Today:  Rectal bleeding: One episode of rectal bleeding about 1 month ago. Only on toilet tissue. Has used Preparation H previously.   Constipation: Linzess 145 mcg daily. Having 1-2 soft, formed BMs daily. No straining.    Dysphagia: Has not noticed much improvement since dilation.  Continues with daily dysphagia symptoms.  More trouble with pills, mostly capsules.  Has to drink a thicker substance or carbonated beverages to help capsules go down.  Steaks and other test meats also cause her trouble despite chewing thoroughly.  No trouble with soft foods.  GERD: Taking Protonix BID. Breakthrough symptoms maybe 1-2 times a week at night. Depends on what she eats. Spicy/tomatobased products will  trigger symptoms. Doesn't eat within 3 hours of going to bed. Has been trying to cut back on fried /greasy foods. Coffee in the morning and in evening. Rare soda. Mostly water. Has been on omeprazole, aciphex, and prevacid previously.   Past Medical History:  Diagnosis Date  . Anxiety   . Arthritis   . Balance problems   . Chronic abdominal pain   . Chronic knee pain   . Constipation   . Difficulty walking   . GERD (gastroesophageal reflux disease)   . Headache   . HTN (hypertension)   . Low back pain   . Panic attacks   . Polyuria   . [place] etiology; on dilantin; last seizures was a few years ago.  . Sleep apnea     Past Surgical History:  Procedure Laterality Date  . BACK SURGERY     complicated by ureteral injury  . CHONDROPLASTY Right 08/31/2016   Procedure: CHONDROPLASTY RIGHT FEMUR;  Surgeon: HCarole Civil MD;  Location: [facility];  Service: Orthopedics;  Laterality: Right;  . COLONOSCOPY WITH PROPOFOL N/A 12/19/2019   Procedure: COLONOSCOPY WITH PROPOFOL;  Surgeon: RDaneil Dolin Nonbleeding external and internal grade 3 hemorrhoids, three 2-839mpolyps, otherwise normal.  Suspected benign anorectal bleeding.  Pathology revealed hyperplastic polyps.  Repeat colonoscopy in 10 years.  . ESOPHAGOGASTRODUODENOSCOPY N/A 09/11/2015   Procedure: ESOPHAGOGASTRODUODENOSCOPY (EGD);  Surgeon: RoDaneil DolinMD;  Normal esophagus s/p dilation, few gastric polyps resected and retrieved, normal duodenum. Gastric polyps benign.   . ESOPHAGOGASTRODUODENOSCOPY (EGD) WITH  ESOPHAGEAL DILATION N/A 03/20/2013   Dr. Rourk:Normal EGD-status post passage of a Maloney dilator/Status post esophageal biopsy., benign path  . ESOPHAGOGASTRODUODENOSCOPY (EGD) WITH PROPOFOL N/A 12/19/2019   Procedure: ESOPHAGOGASTRODUODENOSCOPY (EGD) WITH PROPOFOL;  Surgeon: Daneil Dolin, MD;  Normal esophagus s/p dilation, benign-appearing fundic gland polyps, normal examined duodenum.  Marland Kitchen KNEE  ARTHROSCOPY WITH MEDIAL MENISECTOMY Right 08/31/2016   Procedure: KNEE ARTHROSCOPY WITH MEDIAL MENISECTOMY;  Surgeon: Carole Civil, MD;  Location: [facility];  Service: Orthopedics;  Laterality: Right;  . KNEE ARTHROSCOPY WITH MEDIAL MENISECTOMY Right 09/13/2018   Procedure: KNEE ARTHROSCOPY WITH MEDIAL MENISCECTOMY AND MICROFRACTURE;  Surgeon: Carole Civil, MD;  Location: [facility];  Service: Orthopedics;  Laterality: Right;  . MALONEY DILATION N/A 09/11/2015   Procedure: Venia Minks DILATION;  Surgeon: Daneil Dolin, MD;  Location: [facility];  Service: Endoscopy;  Laterality: N/A;  . Venia Minks DILATION N/A 12/19/2019   Procedure: Venia Minks DILATION;  Surgeon: Daneil Dolin, MD;  Location: [facility];  Service: Endoscopy;  Laterality: N/A;  . POLYPECTOMY  12/19/2019   Procedure: POLYPECTOMY;  Surgeon: Daneil Dolin, MD;  Location: [facility];  Service: Endoscopy;;    Current Outpatient Medications  Medication Sig Dispense Refill  . ALPRAZolam (XANAX) 0.5 MG tablet Take 0.5 mg by mouth 2 (two) times daily as needed for anxiety.   5  . amLODipine (NORVASC) 10 MG tablet Take 10 mg by mouth daily at 2 PM.     . DULoxetine (CYMBALTA) 30 MG capsule Take 30 mg by mouth daily.    Marland Kitchen estradiol (ESTRACE) 0.5 MG tablet TAKE 1 TABLET BY MOUTH DAILY 30 tablet 11  . fluticasone (FLONASE) 50 MCG/ACT nasal spray Place 2 sprays into both nostrils daily.    . irbesartan (AVAPRO) 300 MG tablet Take 300 mg by mouth daily at 2 PM.   12  . LINZESS 290 MCG CAPS capsule TAKE 1 CAPSULE BY MOUTH EVERY DAY BEFORE BREAKFAST (Patient taking differently: 145 mcg. Takes 145 mcg daily.) 90 capsule 3  . MYRBETRIQ 25 MG TB24 tablet Take 25 mg by mouth daily at 2 PM.     . Omega-3 Fatty Acids (FISH OIL) 1000 MG CAPS Take 2,000 mg by mouth at bedtime.     . ondansetron (ZOFRAN) 4 MG tablet Take 1 tablet (4 mg total) by mouth every 6 (six) hours. (Patient taking differently: Take 4 mg by mouth as needed for nausea or  vomiting.) 12 tablet 0  . oxyCODONE-acetaminophen (PERCOCET/ROXICET) 5-325 MG tablet Take 1 tablet by mouth every 4 (four) hours as needed for severe pain (pain.).     Marland Kitchen phenytoin (DILANTIN) 100 MG ER capsule Take 200 mg by mouth in the morning and at bedtime.     . Probiotic Product (PROBIOTIC-10 PO) Take 1 capsule by mouth daily.    . progesterone (PROMETRIUM) 100 MG capsule Take 100 mg by mouth See admin instructions. Take 1 capsule (100 mg) by mouth daily x 14 days every 3 months    . pantoprazole (PROTONIX) 40 MG tablet Take 1 tablet (40 mg total) by mouth 2 (two) times daily. 180 tablet 3   No current facility-administered medications for this visit.    Allergies as of 03/19/2020 - Review Complete 03/19/2020  Allergen Reaction Noted  . Benadryl [diphenhydramine hcl] Rash 01/17/2012    Family History  Problem Relation Age of Onset  . Throat cancer Father   . Heart disease Father   . Cancer Father   .  Hypertension Father   . Diabetes Father   . Other Mother        esophageal stricture and gallbladder disease  . Thyroid disease Mother   . Other Sister        esophageal stricture and gallbladder problems.   . Diabetes Sister   . Hypertension Sister   . Thyroid disease Sister   . Heart disease Maternal Grandfather   . Diabetes Maternal Grandfather   . Arthritis Maternal Grandfather   . Colon cancer Neg Hx   . Liver disease Neg Hx   . Pancreatic cancer Neg Hx     Social History   Socioeconomic History  . Marital status: Single    Spouse name: Not on file  . Number of children: 0  . Years of education: 34  . Highest education level: Not on file  Occupational History  . Occupation: disability    Employer: DISABLED  Tobacco Use  . Smoking status: Former Smoker    Packs/day: 2.00    Years: 9.00    Pack years: 18.00    Types: Cigarettes    Quit date: 04/04/1994    Years since quitting: 25.9  . Smokeless tobacco: Never Used  . Tobacco comment: smoked 8 years, quit 18  years ago in the 1996  Vaping Use  . Vaping Use: Never used  Substance and Sexual Activity  . Alcohol use: No    Alcohol/week: 0.0 standard drinks  . Drug use: No  . Sexual activity: Not Currently    Birth control/protection: None  Other Topics Concern  . Not on file  Social History Narrative  . Not on file   Social Determinants of Health   Financial Resource Strain: Not on file  Food Insecurity: Not on file  Transportation Needs: Not on file  Physical Activity: Not on file  Stress: Not on file  Social Connections: Not on file    Review of Systems: Gen: Denies fever or chills.  Admits to sinus congestion.  No lightheadedness, dizziness, presyncope, syncope. CV: Denies chest pain or palpitations.. Resp: Denies dyspnea.  Admits to recent cough. GI: See HPI. Heme: See HPI  Physical Exam: BP 121/82   Pulse 100   Temp (!) 96.6 F (35.9 C) (Temporal)   Ht 5' 6"  (1.676 m)   Wt 180 lb 6.4 oz (81.8 kg)   LMP 07/06/2018   BMI 29.12 kg/m  General:   Alert and oriented. No distress noted. Pleasant and cooperative.  Head:  Normocephalic and atraumatic. Eyes:  Conjuctiva clear without scleral icterus. Heart:  S1, S2 present without murmurs appreciated. Lungs:  Clear to auscultation bilaterally. No wheezes, rales, or rhonchi. No distress.  Abdomen:  +BS, soft, non-tender and non-distended. No rebound or guarding. No HSM or masses noted. Msk:  Symmetrical without gross deformities. Normal posture. Extremities:  Without edema. Neurologic:  Alert and  oriented x4 Psych: Normal mood and affect.

## 2020-03-19 ENCOUNTER — Other Ambulatory Visit: Payer: Self-pay

## 2020-03-19 ENCOUNTER — Ambulatory Visit (INDEPENDENT_AMBULATORY_CARE_PROVIDER_SITE_OTHER): Payer: Medicare Other | Admitting: Gastroenterology

## 2020-03-19 ENCOUNTER — Encounter: Payer: Self-pay | Admitting: Gastroenterology

## 2020-03-19 ENCOUNTER — Telehealth: Payer: Self-pay

## 2020-03-19 VITALS — BP 121/82 | HR 100 | Temp 96.6°F | Ht 66.0 in | Wt 180.4 lb

## 2020-03-19 DIAGNOSIS — K625 Hemorrhage of anus and rectum: Secondary | ICD-10-CM

## 2020-03-19 DIAGNOSIS — K5900 Constipation, unspecified: Secondary | ICD-10-CM

## 2020-03-19 DIAGNOSIS — R1319 Other dysphagia: Secondary | ICD-10-CM

## 2020-03-19 DIAGNOSIS — K219 Gastro-esophageal reflux disease without esophagitis: Secondary | ICD-10-CM

## 2020-03-19 MED ORDER — PANTOPRAZOLE SODIUM 40 MG PO TBEC: 40.0000 mg | DELAYED_RELEASE_TABLET | Freq: Two times a day (BID) | ORAL | 3 refills | Status: AC

## 2020-03-19 NOTE — Assessment & Plan Note (Addendum)
Fairly well controlled on Protonix 40 mg twice daily.  Breakthrough symptoms at night 1-2 times a week which seems to be associated with dietary triggers. Recent EGD 12/19/2019 with normal esophagus s/p dilation, benign-appearing fundic gland polyps, normal examined duodenum.  Plan: Advised she continue protonix 40 mg BID.  Counseled on sticking to a strict GERD diet:   Avoid fried, fatty, greasy, spicy, citrus foods.  Avoid caffeine and carbonated beverages.  Avoid chocolate.  Try eating 4-6 small meals a day rather than 3 large meals.  Do not eat within 3 hours of laying down.  Prop head of bed up on wood or bricks to create a 6 inch incline. Advised she call if she breakthrough symptoms continue despite following strict GERD diet/lifestyle.  Follow-up in 6 months.

## 2020-03-19 NOTE — Patient Instructions (Addendum)
We will arrange for you to have a swallowing study at [facility].  Take small bites, chew thoroughly, drink plenty of liquids throughout meals. All meats should be chopped finely. Avoid tough textures.  Continue Linzess 145 mcg daily 30 minutes before breakfast.  As you are having trouble swallowing capsules please see the instructions below to administer Linzess: Administration in applesauce: Open capsule and sprinkle entire contents (beads) onto 1 teaspoonful (5 mL) of room temperature applesauce. Swallow entire contents immediately; do not chew the beads. Do not store mixture for future use. Administration in water: Open capsule and sprinkle entire contents (beads) into a cup with ~30 mL of room temperature bottled water. Gently swirl for at least 20 seconds and swallow immediately; add another 30 mL of water to any remaining beads in cup, swirl for at least 20 seconds, and swallow immediately. Do not store the water-bead mixture for future use. Note: The drug is coated on surface of beads and will dissolve off the beads in water; beads will remain visible and will not dissolve; therefore, it is not necessary to consume all the beads to deliver complete dose.  Continue taking Protonix 40 mg twice daily 30 minutes before breakfast and dinner. Avoid fried, fatty, greasy, spicy, citrus foods. Avoid caffeine and carbonated beverages. Avoid chocolate. Try eating 4-6 small meals a day rather than 3 large meals. Do not eat within 3 hours of laying down. Prop head of bed up on wood or bricks to create a 6 inch incline.  We will plan to follow-up with you in 6 months.  Do not hesitate to call if you have questions or concerns prior.  It was great seeing you today! Have a great Christmas!  Aliene Altes, PA-C [facility]    Food Choices for Gastroesophageal Reflux Disease, Adult When you have gastroesophageal reflux disease (GERD), the foods you eat and your eating habits are  very important. Choosing the right foods can help ease your discomfort. Think about working with a nutrition specialist (dietitian) to help you make good choices. What are tips for following this plan?  Meals  Choose healthy foods that are low in fat, such as fruits, vegetables, whole grains, low-fat dairy products, and lean meat, fish, and poultry.  Eat small meals often instead of 3 large meals a day. Eat your meals slowly, and in a place where you are relaxed. Avoid bending over or lying down until 2-3 hours after eating.  Avoid eating meals 2-3 hours before bed.  Avoid drinking a lot of liquid with meals.  Cook foods using methods other than frying. Bake, grill, or broil food instead.  Avoid or limit: ? Chocolate. ? Peppermint or spearmint. ? Alcohol. ? Pepper. ? Black and decaffeinated coffee. ? Black and decaffeinated tea. ? Bubbly (carbonated) soft drinks. ? Caffeinated energy drinks and soft drinks.  Limit high-fat foods such as: ? Fatty meat or fried foods. ? Whole milk, cream, butter, or ice cream. ? Nuts and nut butters. ? Pastries, donuts, and sweets made with butter or shortening.  Avoid foods that cause symptoms. These foods may be different for everyone. Common foods that cause symptoms include: ? Tomatoes. ? Oranges, lemons, and limes. ? Peppers. ? Spicy food. ? Onions and garlic. ? Vinegar. Lifestyle  Maintain a healthy weight. Ask your doctor what weight is healthy for you. If you need to lose weight, work with your doctor to do so safely.  Exercise for at least 30 minutes for 5 or more days  each week, or as told by your doctor.  Wear loose-fitting clothes.  Do not smoke. If you need help quitting, ask your doctor.  Sleep with the head of your bed higher than your feet. Use a wedge under the mattress or blocks under the bed frame to raise the head of the bed. Summary  When you have gastroesophageal reflux disease (GERD), food and lifestyle choices  are very important in easing your symptoms.  Eat small meals often instead of 3 large meals a day. Eat your meals slowly, and in a place where you are relaxed.  Limit high-fat foods such as fatty meat or fried foods.  Avoid bending over or lying down until 2-3 hours after eating.  Avoid peppermint and spearmint, caffeine, alcohol, and chocolate. This information is not intended to replace advice given to you by your health care provider. Make sure you discuss any questions you have with your health care provider. Document Revised: 07/12/2018 Document Reviewed: 04/26/2016 Elsevier Patient Education  [place].

## 2020-03-19 NOTE — Assessment & Plan Note (Signed)
Recently underwent EGD 12/19/2019 due to report of solid food and pill dysphagia.  EGD revealed normal esophagus s/p empiric dilation.  Patient denies any significant improvement in her dysphagia symptoms.  She continues to struggle with swallowing pills daily, primarily capsules, and tough meats.   Query whether she may have esophageal dysmotility.  Plan: Proceed with barium pill esophagram in the near future. Take small bites, chew thoroughly, drink plenty of liquids throughout meals. All meats should be chopped finely. Avoid tough textures. Further recommendations to follow BPE.

## 2020-03-19 NOTE — Assessment & Plan Note (Signed)
Chronic.  Well-controlled on Linzess 145 mcg daily.  Recent colonoscopy September 2021 with hyperplastic polyps, due for repeat in 10 years. Advise she continue her current medications.  Due to dysphagia and having some trouble swallowing capsules, separate instructions were given on how to administer Linzess in applesauce or water (see AVS).  Follow-up in 6 months.

## 2020-03-19 NOTE — Telephone Encounter (Signed)
BPE scheduled for 03/24/20 at 10:00am, arrive at 9:45am. NPO 3 hours prior to test.  Called and informed pt of appt. Appt letter sent via [place].

## 2020-03-19 NOTE — Assessment & Plan Note (Addendum)
Low-volume intermittent rectal bleeding resolved.  Likely secondary to hemorrhoids in setting of constipation.  Colonoscopy 12/19/2019 with nonbleeding external and internal grade 3 hemorrhoids and hyperplastic polyps.  Due for repeat colonoscopy in 2031.  Constipation is well managed with Linzess 145 mcg daily.  Advise she continue her current medications.  We also discussed the importance of avoiding straining and limiting toilet time to 2-3 minutes.  Advised that she could use Preparation H twice daily as needed for intermittent rectal bleeding.  Follow-up in 6 months.

## 2020-03-23 ENCOUNTER — Ambulatory Visit (INDEPENDENT_AMBULATORY_CARE_PROVIDER_SITE_OTHER): Payer: Medicare Other | Admitting: Orthopedic Surgery

## 2020-03-23 ENCOUNTER — Encounter: Payer: Self-pay | Admitting: Orthopedic Surgery

## 2020-03-23 ENCOUNTER — Other Ambulatory Visit: Payer: Self-pay

## 2020-03-23 VITALS — BP 148/95 | HR 115 | Ht 66.0 in | Wt 176.0 lb

## 2020-03-23 DIAGNOSIS — Z9889 Other specified postprocedural states: Secondary | ICD-10-CM | POA: Diagnosis not present

## 2020-03-23 DIAGNOSIS — G8929 Other chronic pain: Secondary | ICD-10-CM | POA: Diagnosis not present

## 2020-03-23 DIAGNOSIS — M25561 Pain in right knee: Secondary | ICD-10-CM | POA: Diagnosis not present

## 2020-03-23 NOTE — Progress Notes (Signed)
Cc'ed to pcp °

## 2020-03-23 NOTE — Progress Notes (Signed)
Chief Complaint  Patient presents with  . Knee Pain    Right/ worse in past 2 weeks     Body mass index is 28.41 kg/m.  Allergies  Allergen Reactions  . Benadryl [Diphenhydramine Hcl] Rash   49 year old female had knee arthroscopy June 2020 presents with anterior knee pain for 2 weeks.  Also has chronic lower back pain takes Percocet intermittently  Complains of popping and pain in the front of the knee no recent trauma  Past Medical History:  Diagnosis Date  . Anxiety   . Arthritis   . Balance problems   . Chronic abdominal pain   . Chronic knee pain   . Constipation   . Difficulty walking   . GERD (gastroesophageal reflux disease)   . Headache   . HTN (hypertension)   . Low back pain   . Panic attacks   . Polyuria   . Seizures ([place])    unknown etiology; on dilantin; last seizures was a few years ago.  . Sleep apnea     BP (!) 148/95   Pulse (!) 115   Ht 5' 6"  (1.676 m)   Wt 176 lb (79.8 kg)   LMP 07/06/2018   BMI 28.41 kg/m   She is awake and alert she is oriented x3 mood and affect are normal General appearance was normal without any gross abnormalities  The right knee has no effusion she has crepitance and pain on range of motion with tenderness around the peripatellar region joint lines are nontender ligaments are stable with a trace positive anterior drawer sign with a firm endpoint collateral ligaments are stable.  Encounter Diagnoses  Name Primary?  . S/P right knee arthroscopy *with microfracture 09/13/18 Yes  . 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  After cleaning the skin with alcohol a 20-gauge needle was used to inject the right knee joint. There were no complications. A sterile bandage was applied.  Knee exercises

## 2020-03-23 NOTE — Patient Instructions (Signed)
Exercises  You have received an injection of steroids into the joint. 15% of patients will have increased pain within the 24 hours postinjection.   This is transient and will go away.   We recommend that you use ice packs on the injection site for 20 minutes every 2 hours and extra strength Tylenol 2 tablets every 8 as needed until the pain resolves.  If you continue to have pain after taking the Tylenol and using the ice please call the office for further instructions.  Over the counter ibuprofen as needed

## 2020-03-24 ENCOUNTER — Ambulatory Visit (HOSPITAL_COMMUNITY): Payer: Medicare Other

## 2020-03-30 ENCOUNTER — Ambulatory Visit (HOSPITAL_COMMUNITY): Payer: Medicare Other

## 2020-04-08 ENCOUNTER — Ambulatory Visit (HOSPITAL_COMMUNITY)
Admission: RE | Admit: 2020-04-08 | Discharge: 2020-04-08 | Disposition: A | Discharge status: Home / Self Care | Payer: Medicare Other | Source: Ambulatory Visit | Attending: Gastroenterology | Admitting: Gastroenterology

## 2020-04-08 ENCOUNTER — Other Ambulatory Visit: Payer: Self-pay

## 2020-04-08 DIAGNOSIS — R1319 Other dysphagia: Secondary | ICD-10-CM | POA: Diagnosis not present

## 2020-05-28 DIAGNOSIS — Z712 Person consulting for explanation of examination or test findings: Secondary | ICD-10-CM | POA: Diagnosis not present

## 2020-05-28 DIAGNOSIS — I10 Essential (primary) hypertension: Secondary | ICD-10-CM | POA: Diagnosis not present

## 2020-05-28 DIAGNOSIS — R7303 Prediabetes: Secondary | ICD-10-CM | POA: Diagnosis not present

## 2020-05-28 DIAGNOSIS — I959 Hypotension, unspecified: Secondary | ICD-10-CM | POA: Diagnosis not present

## 2020-05-28 DIAGNOSIS — G4733 Obstructive sleep apnea (adult) (pediatric): Secondary | ICD-10-CM | POA: Diagnosis not present

## 2020-05-28 DIAGNOSIS — R55 Syncope and collapse: Secondary | ICD-10-CM | POA: Diagnosis not present

## 2020-06-02 DIAGNOSIS — I10 Essential (primary) hypertension: Secondary | ICD-10-CM | POA: Diagnosis not present

## 2020-06-02 DIAGNOSIS — E785 Hyperlipidemia, unspecified: Secondary | ICD-10-CM | POA: Diagnosis not present

## 2020-06-02 DIAGNOSIS — R945 Abnormal results of liver function studies: Secondary | ICD-10-CM | POA: Diagnosis not present

## 2020-06-02 DIAGNOSIS — K922 Gastrointestinal hemorrhage, unspecified: Secondary | ICD-10-CM | POA: Diagnosis not present

## 2020-06-02 DIAGNOSIS — G8929 Other chronic pain: Secondary | ICD-10-CM | POA: Diagnosis not present

## 2020-06-02 DIAGNOSIS — R7303 Prediabetes: Secondary | ICD-10-CM | POA: Diagnosis not present

## 2020-06-02 DIAGNOSIS — K5900 Constipation, unspecified: Secondary | ICD-10-CM | POA: Diagnosis not present

## 2020-06-02 DIAGNOSIS — N3281 Overactive bladder: Secondary | ICD-10-CM | POA: Diagnosis not present

## 2020-06-02 DIAGNOSIS — M792 Neuralgia and neuritis, unspecified: Secondary | ICD-10-CM | POA: Diagnosis not present

## 2020-06-02 DIAGNOSIS — K219 Gastro-esophageal reflux disease without esophagitis: Secondary | ICD-10-CM | POA: Diagnosis not present

## 2020-06-16 ENCOUNTER — Other Ambulatory Visit: Payer: Self-pay | Admitting: Internal Medicine

## 2020-07-21 ENCOUNTER — Other Ambulatory Visit: Payer: Self-pay

## 2020-07-24 DIAGNOSIS — M791 Myalgia, unspecified site: Secondary | ICD-10-CM | POA: Diagnosis not present

## 2020-07-24 DIAGNOSIS — J069 Acute upper respiratory infection, unspecified: Secondary | ICD-10-CM | POA: Diagnosis not present

## 2020-07-24 DIAGNOSIS — R059 Cough, unspecified: Secondary | ICD-10-CM | POA: Diagnosis not present

## 2020-07-24 DIAGNOSIS — R6883 Chills (without fever): Secondary | ICD-10-CM | POA: Diagnosis not present

## 2020-07-28 DIAGNOSIS — N3281 Overactive bladder: Secondary | ICD-10-CM | POA: Diagnosis not present

## 2020-07-28 DIAGNOSIS — J069 Acute upper respiratory infection, unspecified: Secondary | ICD-10-CM | POA: Diagnosis not present

## 2020-07-28 DIAGNOSIS — M545 Low back pain, unspecified: Secondary | ICD-10-CM | POA: Diagnosis not present

## 2020-07-28 DIAGNOSIS — R000 Tachycardia, unspecified: Secondary | ICD-10-CM | POA: Diagnosis not present

## 2020-08-17 ENCOUNTER — Encounter (HOSPITAL_COMMUNITY): Payer: Self-pay

## 2020-08-17 ENCOUNTER — Emergency Department (HOSPITAL_COMMUNITY)
Admission: EM | Admit: 2020-08-17 | Discharge: 2020-08-17 | Disposition: A | Discharge status: Home / Self Care | Payer: Medicare Other | Attending: Emergency Medicine | Admitting: Emergency Medicine

## 2020-08-17 ENCOUNTER — Other Ambulatory Visit: Payer: Self-pay

## 2020-08-17 ENCOUNTER — Emergency Department (HOSPITAL_COMMUNITY): Payer: Medicare Other

## 2020-08-17 DIAGNOSIS — W208XXA Other cause of strike by thrown, projected or falling object, initial encounter: Secondary | ICD-10-CM | POA: Diagnosis not present

## 2020-08-17 DIAGNOSIS — Z79899 Other long term (current) drug therapy: Secondary | ICD-10-CM | POA: Diagnosis not present

## 2020-08-17 DIAGNOSIS — M25521 Pain in right elbow: Secondary | ICD-10-CM | POA: Diagnosis not present

## 2020-08-17 DIAGNOSIS — Z87891 Personal history of nicotine dependence: Secondary | ICD-10-CM | POA: Insufficient documentation

## 2020-08-17 DIAGNOSIS — I10 Essential (primary) hypertension: Secondary | ICD-10-CM | POA: Insufficient documentation

## 2020-08-17 DIAGNOSIS — M778 Other enthesopathies, not elsewhere classified: Secondary | ICD-10-CM | POA: Diagnosis not present

## 2020-08-17 DIAGNOSIS — R600 Localized edema: Secondary | ICD-10-CM | POA: Diagnosis not present

## 2020-08-17 NOTE — ED Triage Notes (Signed)
Pt presents to ED with complaints of right elbow pain x 2 weeks after hitting on a piece of wood.

## 2020-08-17 NOTE — Discharge Instructions (Addendum)
I recommend a combination of tylenol and ibuprofen for management of your pain. You can take a low dose of both at the same time. I recommend 500 mg of Tylenol combined with 600 mg of ibuprofen. This is one maximum strength Tylenol and three regular ibuprofen. You can take these 2-3 times for day for your pain. Please try to take these medications with a small amount of food as well to prevent upsetting your stomach.  Also, please consider topical pain relieving creams such as Voltaran Gel, BioFreeze, or Icy Hot. There is also a pain relieving cream made by [employer]. You should be able to find all of these at your local pharmacy.   Please call Dr. Aline Brochure in 1 week to schedule a follow-up appointment if your symptoms have not improved.  You can always return to the emergency department if your symptoms worsen.  It was a pleasure to meet you.

## 2020-08-17 NOTE — ED Provider Notes (Signed)
[facility] EMERGENCY DEPARTMENT Provider Note   CSN: [id number] Arrival date & time: 08/17/20  7829     History Chief Complaint  Patient presents with  . Elbow Pain    [name] is a 50 y.o. female.  HPI Patient is a 50 year old female who is right-hand dominant presents to the ED due to right elbow pain.  She states her symptoms started about 2 weeks ago.  She was starting a weed eater with her right arm and when pulling backwards the right elbow struck a wooden 2 x 4.  She states that she has had constant moderate pain in the region.  She has been taking ibuprofen as well as Tylenol with minimal relief.  Also notes applying ice, heat, as well as Voltaren gel.  She states heat provides mild relief but otherwise gets no relief from Voltaren gel or ice.  Denies any numbness, tingling, or weakness.  Pain worsens with palpation of the posterior elbow as well as range of motion of the joint.  Reports mild swelling in the joint.  No other complaints.  She is followed by Dr. Aline Brochure with orthopedics.    Past Medical History:  Diagnosis Date  . Anxiety   . Arthritis   . Balance problems   . Chronic abdominal pain   . Chronic knee pain   . Constipation   . Difficulty walking   . GERD (gastroesophageal reflux disease)   . Headache   . HTN (hypertension)   . Low back pain   . Panic attacks   . Polyuria   . Seizures ([place])    unknown etiology; on dilantin; last seizures was a few years ago.  . Sleep apnea     Patient Active Problem List   Diagnosis Date Noted  . Rectal bleeding 08/12/2019  . S/P right knee arthroscopy *with microfracture 09/13/18 09/18/2018  . Acute medial meniscus tear of right knee   . 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 Primary osteoarthritis of right knee   . Gastric polyp   . GERD (gastroesophageal reflux disease) 08/24/2015  . Hypoactive sexual desire disorder 05/14/2014   . Abdominal pain, chronic, epigastric 12/08/2013  . Constipation 12/08/2013  . Esophageal dysphagia 02/22/2013  . Abdominal pain, epigastric 02/22/2013  . LUQ pain 02/22/2013  . Difficulty in walking(719.7) 01/25/2012  . Stiffness of joint, not elsewhere classified, ankle and foot 01/25/2012  . Balance problems 01/25/2012  . Ankle sprain 01/17/2012  . Ankle pain, right 01/17/2012    Past Surgical History:  Procedure Laterality Date  . BACK SURGERY     complicated by ureteral injury  . CHONDROPLASTY Right 08/31/2016   Procedure: CHONDROPLASTY RIGHT FEMUR;  Surgeon: Carole Civil, MD;  Location: [facility];  Service: Orthopedics;  Laterality: Right;  . COLONOSCOPY WITH PROPOFOL N/A 12/19/2019   Procedure: COLONOSCOPY WITH PROPOFOL;  Surgeon: Daneil Dolin, Nonbleeding external and internal grade 3 hemorrhoids, three 2-61m polyps, otherwise normal.  Suspected benign anorectal bleeding.  Pathology revealed hyperplastic polyps.  Repeat colonoscopy in 10 years.  . ESOPHAGOGASTRODUODENOSCOPY N/A 09/11/2015   Procedure: ESOPHAGOGASTRODUODENOSCOPY (EGD);  Surgeon: RDaneil Dolin MD;  Normal esophagus s/p dilation, few gastric polyps resected and retrieved, normal duodenum. Gastric polyps benign.   . ESOPHAGOGASTRODUODENOSCOPY (EGD) WITH ESOPHAGEAL DILATION N/A 03/20/2013   Dr. Rourk:Normal EGD-status post passage of a Maloney dilator/Status post esophageal biopsy., benign path  . ESOPHAGOGASTRODUODENOSCOPY (EGD) WITH  PROPOFOL N/A 12/19/2019   Procedure: ESOPHAGOGASTRODUODENOSCOPY (EGD) WITH PROPOFOL;  Surgeon: Daneil Dolin, MD;  Normal esophagus s/p dilation, benign-appearing fundic gland polyps, normal examined duodenum.  Marland Kitchen KNEE ARTHROSCOPY WITH MEDIAL MENISECTOMY Right 08/31/2016   Procedure: KNEE ARTHROSCOPY WITH MEDIAL MENISECTOMY;  Surgeon: Carole Civil, MD;  Location: [facility];  Service: Orthopedics;  Laterality: Right;  . KNEE ARTHROSCOPY WITH MEDIAL MENISECTOMY Right 09/13/2018    Procedure: KNEE ARTHROSCOPY WITH MEDIAL MENISCECTOMY AND MICROFRACTURE;  Surgeon: Carole Civil, MD;  Location: [facility];  Service: Orthopedics;  Laterality: Right;  . MALONEY DILATION N/A 09/11/2015   Procedure: Venia Minks DILATION;  Surgeon: Daneil Dolin, MD;  Location: [facility];  Service: Endoscopy;  Laterality: N/A;  . Venia Minks DILATION N/A 12/19/2019   Procedure: Venia Minks DILATION;  Surgeon: Daneil Dolin, MD;  Location: [facility];  Service: Endoscopy;  Laterality: N/A;  . POLYPECTOMY  12/19/2019   Procedure: POLYPECTOMY;  Surgeon: Daneil Dolin, MD;  Location: [facility];  Service: Endoscopy;;     OB History    Gravida  1   Para      Term      Preterm      AB  1   Living        SAB  1   IAB      Ectopic      Multiple      Live Births              Family History  Problem Relation Age of Onset  . Throat cancer Father   . Heart disease Father   . Cancer Father   . Hypertension Father   . Diabetes Father   . Other Mother        esophageal stricture and gallbladder disease  . Thyroid disease Mother   . Other Sister        esophageal stricture and gallbladder problems.   . Diabetes Sister   . Hypertension Sister   . Thyroid disease Sister   . Heart disease Maternal Grandfather   . Diabetes Maternal Grandfather   . Arthritis Maternal Grandfather   . Colon cancer Neg Hx   . Liver disease Neg Hx   . Pancreatic cancer Neg Hx     Social History   Tobacco Use  . Smoking status: Former Smoker    Packs/day: 2.00    Years: 9.00    Pack years: 18.00    Types: Cigarettes    Quit date: 04/04/1994    Years since quitting: 26.3  . Smokeless tobacco: Never Used  . Tobacco comment: smoked 8 years, quit 18 years ago in the 1996  Vaping Use  . Vaping Use: Never used  Substance Use Topics  . Alcohol use: No    Alcohol/week: 0.0 standard drinks  . Drug use: No    Home Medications Prior to Admission medications   Medication Sig Start Date End  Date Taking? Authorizing Provider  ALPRAZolam Duanne Moron) 0.5 MG tablet Take 0.5 mg by mouth 2 (two) times daily as needed for anxiety.  04/30/14   [provider]  amLODipine (NORVASC) 10 MG tablet Take 10 mg by mouth daily at 2 PM.     [provider]  DULoxetine (CYMBALTA) 30 MG capsule Take 30 mg by mouth daily. 08/21/19   [provider]  estradiol (ESTRACE) 0.5 MG tablet TAKE 1 TABLET BY MOUTH DAILY 02/20/20   Florian Buff, MD  ezetimibe (ZETIA) 10 MG tablet  Take 10 mg by mouth daily. 02/20/20   [provider]  fluticasone (FLONASE) 50 MCG/ACT nasal spray Place 2 sprays into both nostrils daily. 11/11/19   [provider]  irbesartan (AVAPRO) 300 MG tablet Take 300 mg by mouth daily at 2 PM.  07/03/14   [provider]  LINZESS 145 MCG CAPS capsule TAKE ONE CAPSULE BY MOUTH EVERY DAY 06/17/20   Mahala Menghini, PA-C  MYRBETRIQ 25 MG TB24 tablet Take 25 mg by mouth daily at 2 PM.  10/20/15   [provider]  Omega-3 Fatty Acids (FISH OIL) 1000 MG CAPS Take 2,000 mg by mouth at bedtime.     [provider]  ondansetron (ZOFRAN) 4 MG tablet Take 1 tablet (4 mg total) by mouth every 6 (six) hours. Patient taking differently: Take 4 mg by mouth as needed for nausea or vomiting. 11/10/19   Wurst, [place], PA-C  oxyCODONE-acetaminophen (PERCOCET/ROXICET) 5-325 MG tablet Take 1 tablet by mouth every 4 (four) hours as needed for severe pain (pain.).     [provider]  pantoprazole (PROTONIX) 40 MG tablet Take 1 tablet (40 mg total) by mouth 2 (two) times daily. 03/19/20   Erenest Rasher, PA-C  phenytoin (DILANTIN) 100 MG ER capsule Take 200 mg by mouth in the morning and at bedtime.     [provider]  Probiotic Product (PROBIOTIC-10 PO) Take 1 capsule by mouth daily.    [provider]  progesterone (PROMETRIUM) 100 MG capsule Take 100 mg by mouth See admin instructions. Take 1 capsule (100 mg) by mouth  daily x 14 days every 3 months 09/11/19   [provider]  rosuvastatin (CRESTOR) 5 MG tablet Take 5 mg by mouth daily. 01/23/20   [provider]    Allergies    Benadryl [diphenhydramine hcl]  Review of Systems   Review of Systems  Musculoskeletal: Positive for arthralgias and joint swelling.  Skin: Negative for wound.  Neurological: Negative for weakness and numbness.    Physical Exam Updated Vital Signs BP (!) 137/98 (BP Location: Left Arm)   Pulse (!) 109   Temp 97.8 F (36.6 C) (Oral)   Resp 18   Ht 5' 6.5" (1.689 m)   Wt 81.6 kg   LMP 07/06/2018   SpO2 98%   BMI 28.62 kg/m   Physical Exam Vitals and nursing note reviewed.  Constitutional:      General: She is not in acute distress.    Appearance: She is well-developed.  HENT:     Head: Normocephalic and atraumatic.     Right Ear: External ear normal.     Left Ear: External ear normal.  Eyes:     General: No scleral icterus.       Right eye: No discharge.        Left eye: No discharge.     Conjunctiva/sclera: Conjunctivae normal.  Neck:     Trachea: No tracheal deviation.  Cardiovascular:     Rate and Rhythm: Normal rate.  Pulmonary:     Effort: Pulmonary effort is normal. No respiratory distress.     Breath sounds: No stridor.  Abdominal:     General: There is no distension.  Musculoskeletal:        General: Tenderness present. No swelling or deformity.     Cervical back: Neck supple.     Comments: Moderate TTP noted along the right olecranon.  Full range of motion both actively and passively in the right elbow.  Worsening pain with resisted pronation and supination.  No palpable tenderness noted to the medial or lateral epicondyles.  No tenderness appreciated in the right wrist or right shoulder.  2+ radial pulses.  Grip strength is 5/5 in the right arm.  Distal sensation intact.  Good cap refill.  Skin:    General: Skin is warm and dry.     Findings: No rash.  Neurological:      Mental Status: She is alert.     Cranial Nerves: Cranial nerve deficit: no gross deficits.     ED Results / Procedures / Treatments   Labs (all labs ordered are listed, but only abnormal results are displayed) Labs Reviewed - No data to display  EKG None  Radiology DG Elbow Complete Right  Result Date: 08/17/2020 CLINICAL DATA:  Right elbow pain for 2 weeks EXAM: RIGHT ELBOW - COMPLETE 3+ VIEW COMPARISON:  None. FINDINGS: No evidence of acute fracture. No dislocation. Anterior fat pad is visible, nonspecific. No elevation of the posterior fat pad. Joint spaces are maintained. Mild enthesopathic changes at the medial and lateral humeral epicondyles. Mild subcutaneous edema overlying the posterior elbow without appreciable fluid collection. IMPRESSION: 1. No acute osseous abnormality, right elbow. 2. Mild subcutaneous edema overlying the posterior elbow without appreciable fluid collection. Electronically Signed   By: Davina Poke D.O.   On: 08/17/2020 10:03    Procedures Procedures   Medications Ordered in ED Medications - No data to display  ED Course  I have reviewed the triage vital signs and the nursing notes.  Pertinent labs & imaging results that were available during my care of the patient were reviewed by me and considered in my medical decision making (see chart for details).    MDM Rules/Calculators/A&P                          Patient is a 50 year old female who presents to the emergency department due to right elbow pain.  X-ray obtained of the joint which was negative.  Physical exam reassuring.  She does have pain over the right olecranon but appears to be neurovascularly intact in the right arm distal to the site of the injury.  Recommended patient continue to use Tylenol/ibuprofen.  We discussed dosing.  Also discussed continued use of heat as well as Voltaren gel.  Recommended she follow-up with Dr. Aline Brochure with orthopedics in 1 week if she finds that her  symptoms continue to persist.  Discussed return precautions.  Feel that she is stable for discharge at this time and she is agreeable.  Final Clinical Impression(s) / ED Diagnoses Final diagnoses:  Right elbow pain   Rx / DC Orders ED Discharge Orders    None       Rayna Sexton, PA-C 08/17/20 1037    Luna Fuse, MD 08/17/20 [phone]

## 2020-08-27 DIAGNOSIS — I10 Essential (primary) hypertension: Secondary | ICD-10-CM | POA: Insufficient documentation

## 2020-08-31 ENCOUNTER — Other Ambulatory Visit: Payer: Self-pay

## 2020-08-31 ENCOUNTER — Encounter: Payer: Self-pay | Admitting: Emergency Medicine

## 2020-08-31 ENCOUNTER — Ambulatory Visit
Admission: EM | Admit: 2020-08-31 | Discharge: 2020-08-31 | Disposition: A | Discharge status: Home / Self Care | Payer: Medicare Other | Attending: Family Medicine | Admitting: Family Medicine

## 2020-08-31 DIAGNOSIS — M5442 Lumbago with sciatica, left side: Secondary | ICD-10-CM

## 2020-08-31 MED ORDER — KETOROLAC TROMETHAMINE 30 MG/ML IJ SOLN
30.0000 mg | Freq: Once | INTRAMUSCULAR | Status: AC
  Administered 2020-08-31: 30 mg via INTRAMUSCULAR

## 2020-08-31 MED ORDER — DEXAMETHASONE SODIUM PHOSPHATE 10 MG/ML IJ SOLN
10.0000 mg | Freq: Once | INTRAMUSCULAR | Status: AC
  Administered 2020-08-31: 10 mg via INTRAMUSCULAR

## 2020-08-31 MED ORDER — TIZANIDINE HCL 4 MG PO TABS: 4.0000 mg | ORAL_TABLET | Freq: Four times a day (QID) | ORAL | 0 refills | Status: DC | PRN

## 2020-08-31 MED ORDER — PREDNISONE 20 MG PO TABS: 40.0000 mg | ORAL_TABLET | Freq: Every day | ORAL | 0 refills | Status: AC

## 2020-08-31 NOTE — ED Provider Notes (Signed)
[facility]    CSN: [id number] Arrival date & time: 08/31/20  1040      History   Chief Complaint No chief complaint on file.   HPI [name] is a 50 y.o. female.   HPI Patient with a history of low back pain presents for evaluation of left sided back Radiating into her left hip.  She denies any injury and pain has been present x2 weeks.  She denies any recent treatment with steroids.  She has been applying heat and taking NSAIDs without relief of pain symptoms.  Past Medical History:  Diagnosis Date  . Anxiety   . Arthritis   . Balance problems   . Chronic abdominal pain   . Chronic knee pain   . Constipation   . Difficulty walking   . GERD (gastroesophageal reflux disease)   . Headache   . HTN (hypertension)   . Low back pain   . Panic attacks   . Polyuria   . Seizures ([place])    unknown etiology; on dilantin; last seizures was a few years ago.  . Sleep apnea     Patient Active Problem List   Diagnosis Date Noted  . Rectal bleeding 08/12/2019  . S/P right knee arthroscopy *with microfracture 09/13/18 09/18/2018  . Acute medial meniscus tear of right knee   . 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 Primary osteoarthritis of right knee   . Gastric polyp   . GERD (gastroesophageal reflux disease) 08/24/2015  . Hypoactive sexual desire disorder 05/14/2014  . Abdominal pain, chronic, epigastric 12/08/2013  . Constipation 12/08/2013  . Esophageal dysphagia 02/22/2013  . Abdominal pain, epigastric 02/22/2013  . LUQ pain 02/22/2013  . Difficulty in walking(719.7) 01/25/2012  . Stiffness of joint, not elsewhere classified, ankle and foot 01/25/2012  . Balance problems 01/25/2012  . Ankle sprain 01/17/2012  . Ankle pain, right 01/17/2012    Past Surgical History:  Procedure Laterality Date  . BACK SURGERY     complicated by ureteral injury  . CHONDROPLASTY Right  08/31/2016   Procedure: CHONDROPLASTY RIGHT FEMUR;  Surgeon: Carole Civil, MD;  Location: [facility];  Service: Orthopedics;  Laterality: Right;  . COLONOSCOPY WITH PROPOFOL N/A 12/19/2019   Procedure: COLONOSCOPY WITH PROPOFOL;  Surgeon: Daneil Dolin, Nonbleeding external and internal grade 3 hemorrhoids, three 2-68m polyps, otherwise normal.  Suspected benign anorectal bleeding.  Pathology revealed hyperplastic polyps.  Repeat colonoscopy in 10 years.  . ESOPHAGOGASTRODUODENOSCOPY N/A 09/11/2015   Procedure: ESOPHAGOGASTRODUODENOSCOPY (EGD);  Surgeon: RDaneil Dolin MD;  Normal esophagus s/p dilation, few gastric polyps resected and retrieved, normal duodenum. Gastric polyps benign.   . ESOPHAGOGASTRODUODENOSCOPY (EGD) WITH ESOPHAGEAL DILATION N/A 03/20/2013   Dr. Rourk:Normal EGD-status post passage of a Maloney dilator/Status post esophageal biopsy., benign path  . ESOPHAGOGASTRODUODENOSCOPY (EGD) WITH PROPOFOL N/A 12/19/2019   Procedure: ESOPHAGOGASTRODUODENOSCOPY (EGD) WITH PROPOFOL;  Surgeon: RDaneil Dolin MD;  Normal esophagus s/p dilation, benign-appearing fundic gland polyps, normal examined duodenum.  .Marland KitchenKNEE ARTHROSCOPY WITH MEDIAL MENISECTOMY Right 08/31/2016   Procedure: KNEE ARTHROSCOPY WITH MEDIAL MENISECTOMY;  Surgeon: HCarole Civil MD;  Location: [facility];  Service: Orthopedics;  Laterality: Right;  . KNEE ARTHROSCOPY WITH MEDIAL MENISECTOMY Right 09/13/2018   Procedure: KNEE ARTHROSCOPY WITH MEDIAL MENISCECTOMY AND MICROFRACTURE;  Surgeon: HCarole Civil MD;  Location: [facility];  Service: Orthopedics;  Laterality: Right;  . MALONEY DILATION  N/A 09/11/2015   Procedure: Venia Minks DILATION;  Surgeon: Daneil Dolin, MD;  [facility];  Service: Endoscopy;  Laterality: N/A;  . Venia Minks DILATION N/A 12/19/2019   Procedure: Venia Minks DILATION;  Surgeon: Daneil Dolin, MD;  [facility]: [facility];  Service: Endoscopy;  Laterality: N/A;  . POLYPECTOMY  12/19/2019    Procedure: POLYPECTOMY;  Surgeon: Daneil Dolin, MD;  [facility];  Service: Endoscopy;;    OB History    Gravida  1   Para      Term      Preterm      AB  1   Living        SAB  1   IAB      Ectopic      Multiple      Live Births               Home Medications    Prior to Admission medications   Medication Sig Start Date End Date Taking? Authorizing Provider  ALPRAZolam Duanne Moron) 0.5 MG tablet Take 0.5 mg by mouth 2 (two) times daily as needed for anxiety.  04/30/14   [provider]  amLODipine (NORVASC) 10 MG tablet Take 10 mg by mouth daily at 2 PM.     [provider]  DULoxetine (CYMBALTA) 30 MG capsule Take 30 mg by mouth daily. 08/21/19   [provider]  estradiol (ESTRACE) 0.5 MG tablet TAKE 1 TABLET BY MOUTH DAILY 02/20/20   Florian Buff, MD  ezetimibe (ZETIA) 10 MG tablet Take 10 mg by mouth daily. 02/20/20   [provider]  fluticasone (FLONASE) 50 MCG/ACT nasal spray Place 2 sprays into both nostrils daily. 11/11/19   [provider]  irbesartan (AVAPRO) 300 MG tablet Take 300 mg by mouth daily at 2 PM.  07/03/14   [provider]  LINZESS 145 MCG CAPS capsule TAKE ONE CAPSULE BY MOUTH EVERY DAY 06/17/20   Mahala Menghini, PA-C  MYRBETRIQ 25 MG TB24 tablet Take 25 mg by mouth daily at 2 PM.  10/20/15   [provider]  Omega-3 Fatty Acids (FISH OIL) 1000 MG CAPS Take 2,000 mg by mouth at bedtime.     [provider]  ondansetron (ZOFRAN) 4 MG tablet Take 1 tablet (4 mg total) by mouth every 6 (six) hours. Patient taking differently: Take 4 mg by mouth as needed for nausea or vomiting. 11/10/19   W[place], [place], PA-C  oxyCODONE-acetaminophen (PERCOCET/ROXICET) 5-325 MG tablet Take 1 tablet by mouth every 4 (four) hours as needed for severe pain (pain.).     [provider]  pantoprazole (PROTONIX) 40 MG tablet Take 1 tablet (40 mg total) by mouth 2 (two) times daily.  03/19/20   Erenest Rasher, PA-C  phenytoin (DILANTIN) 100 MG ER capsule Take 200 mg by mouth in the morning and at bedtime.     [provider]  Probiotic Product (PROBIOTIC-10 PO) Take 1 capsule by mouth daily.    [provider]  progesterone (PROMETRIUM) 100 MG capsule Take 100 mg by mouth See admin instructions. Take 1 capsule (100 mg) by mouth daily x 14 days every 3 months 09/11/19   [provider]  rosuvastatin (CRESTOR) 5 MG tablet Take 5 mg by mouth daily. 01/23/20   [provider]    Family History Family History  Problem Relation Age of Onset  . Throat cancer Father   . Heart disease Father   .  Cancer Father   . Hypertension Father   . Diabetes Father   . Other Mother        esophageal stricture and gallbladder disease  . Thyroid disease Mother   . Other Sister        esophageal stricture and gallbladder problems.   . Diabetes Sister   . Hypertension Sister   . Thyroid disease Sister   . Heart disease Maternal Grandfather   . Diabetes Maternal Grandfather   . Arthritis Maternal Grandfather   . Colon cancer Neg Hx   . Liver disease Neg Hx   . Pancreatic cancer Neg Hx     Social History Social History   Tobacco Use  . Smoking status: Former Smoker    Packs/day: 2.00    Years: 9.00    Pack years: 18.00    Types: Cigarettes    Quit date: 04/04/1994    Years since quitting: 26.4  . Smokeless tobacco: Never Used  . Tobacco comment: smoked 8 years, quit 18 years ago in the 1996  Vaping Use  . Vaping Use: Never used  Substance Use Topics  . Alcohol use: No    Alcohol/week: 0.0 standard drinks  . Drug use: No     Allergies   Benadryl [diphenhydramine hcl]   Review of Systems Review of Systems Pertinent negatives listed in HPI  Physical Exam Triage Vital Signs ED Triage Vitals [08/31/20 1154]  Enc Vitals Group     BP 118/84     Pulse Rate (!) 108     Resp 16     Temp 98.1 F (36.7 C)     Temp Source Oral      SpO2 95 %     Weight      Height      Head Circumference      Peak Flow      Pain Score 8     Pain Loc      Pain Edu?      Excl. in [place]?    No data found.  Updated Vital Signs BP 118/84 (BP Location: Right Arm)   Pulse (!) 108   Temp 98.1 F (36.7 C) (Oral)   Resp 16   LMP 07/06/2018   SpO2 95%   Visual Acuity Right Eye Distance:   Left Eye Distance:   Bilateral Distance:    Right Eye Near:   Left Eye Near:    Bilateral Near:     Physical Exam  General appearance: Patient is alert, cooperative, without distress Head: Normocephalic, without obvious abnormality, atraumatic Respiratory: Respirations even and unlabored, normal respiratory rate Heart: rate and rhythm normal. No gallop or murmurs noted on exam  Lumbosacral spine area reveals no local tenderness or mass. Painful and reduced LS ROM noted. SL  raise is + left  Extremities: No gross deformities Skin: Skin color, texture, turgor normal. No rashes seen  Psych: Appropriate mood and affect. Neurologic: GCS 15, normal coordination antalgic gait   UC Treatments / Results  Labs (all labs ordered are listed, but only abnormal results are displayed) Labs Reviewed - No data to display  EKG   Radiology No results found.  Procedures Procedures (including critical care time)  Medications Ordered in UC Medications - No data to display  Initial Impression / Assessment and Plan / UC Course  I have reviewed the triage vital signs and the nursing notes.  Pertinent labs & imaging results that were available during my care of the patient were reviewed by  me and considered in my medical decision making (see chart for details).     Patient presents today with left-sided lower back and hip pain no known injury.  Patient has a history of chronic low back pain treated today for left-sided low back pain with sciatica.  Patient given Decadron and Toradol IM while here in clinic.  Discharging home with instructions to start  oral prednisone tomorrow take as directed for 5 days.  May take tizanidine every 6 hours as needed for acute back pain.  Follow-up with PCP as needed. Final Clinical Impressions(s) / UC Diagnoses   Final diagnoses:  Acute left-sided low back pain with left-sided sciatica   Discharge Instructions   None    ED Prescriptions    Medication Sig Dispense Auth. Provider   tiZANidine (ZANAFLEX) 4 MG tablet Take 1 tablet (4 mg total) by mouth every 6 (six) hours as needed for muscle spasms. 30 tablet Scot Jun, FNP   predniSONE (DELTASONE) 20 MG tablet Take 2 tablets (40 mg total) by mouth daily with breakfast for 5 days. 10 tablet Scot Jun, FNP     PDMP not reviewed this encounter.   Scot Jun, FNP 08/31/20 1319

## 2020-08-31 NOTE — ED Triage Notes (Signed)
Left sided lower back and hip pain x 2 weeks.  States pain is getting worse.

## 2020-09-02 DIAGNOSIS — I10 Essential (primary) hypertension: Secondary | ICD-10-CM | POA: Diagnosis not present

## 2020-09-02 DIAGNOSIS — R7303 Prediabetes: Secondary | ICD-10-CM | POA: Diagnosis not present

## 2020-09-03 ENCOUNTER — Other Ambulatory Visit: Payer: Self-pay | Admitting: *Deleted

## 2020-09-03 MED ORDER — PROGESTERONE MICRONIZED 100 MG PO CAPS
100.0000 mg | ORAL_CAPSULE | ORAL | 6 refills | Status: DC
Start: 2020-09-03 — End: 2021-05-12

## 2020-09-08 DIAGNOSIS — E782 Mixed hyperlipidemia: Secondary | ICD-10-CM | POA: Diagnosis not present

## 2020-09-08 DIAGNOSIS — K219 Gastro-esophageal reflux disease without esophagitis: Secondary | ICD-10-CM | POA: Diagnosis not present

## 2020-09-08 DIAGNOSIS — K5900 Constipation, unspecified: Secondary | ICD-10-CM | POA: Diagnosis not present

## 2020-09-08 DIAGNOSIS — N3281 Overactive bladder: Secondary | ICD-10-CM | POA: Diagnosis not present

## 2020-09-08 DIAGNOSIS — I10 Essential (primary) hypertension: Secondary | ICD-10-CM | POA: Diagnosis not present

## 2020-09-08 DIAGNOSIS — Z86010 Personal history of colonic polyps: Secondary | ICD-10-CM | POA: Diagnosis not present

## 2020-09-08 DIAGNOSIS — G8929 Other chronic pain: Secondary | ICD-10-CM | POA: Diagnosis not present

## 2020-09-08 DIAGNOSIS — R7303 Prediabetes: Secondary | ICD-10-CM | POA: Diagnosis not present

## 2020-09-14 DIAGNOSIS — M545 Low back pain, unspecified: Secondary | ICD-10-CM | POA: Diagnosis not present

## 2020-09-14 DIAGNOSIS — M25552 Pain in left hip: Secondary | ICD-10-CM | POA: Diagnosis not present

## 2020-09-16 NOTE — Progress Notes (Signed)
Referring Provider: Celene Squibb, MD Primary Care Physician:  Celene Squibb, MD Primary GI Physician: Dr. Gala Romney  Chief Complaint  Patient presents with   Gastroesophageal Reflux    Doing ok, swallowing ok   Constipation    Ok, no bleeding    HPI:   [name] is a 50 y.o. female with history of GERD, dysphagia, hemorrhoids, intermittent rectal bleeding, and constipation presenting today for 58-monthfollow-up.  Colonoscopy up-to-date in September 2021, due for repeat in 2031.  EGD also in September 2021 with normal esophagus s/p empiric dilation, benign-appearing fundic gland polyps, normal examined duodenum.  Last seen in our office 03/19/2020.  Constipation doing well with Linzess 145 mcg daily.  Rectal bleeding had essentially resolved.  GERD fairly well controlled on Protonix twice daily with breakfast.  A couple times a week at night.  Did not notice much improvement in dysphagia after dilation.  More trouble with pills and meats.  Plan for BPE, continue Protonix 40 mg twice daily, strict GERD diet, continue Linzess 145 mcg daily with instructions given on how to administer in applesauce or water due to dysphagia, follow-up in 6 months.  BPE 04/08/2020: No esophageal abnormalities, mild age-related dysmotility with incomplete clearance of barium by primary peristaltic waves, 12.5 mm barium tablet easily passed into the stomach without obstruction.  Discussed dietary adjustments including avoiding tough textures, chopping meats finely, eating slowly, chewing thoroughly, small bites, and drinking plenty of liquids during meals.  Today:  GERD: Doing well on Protonix 40 mg BID. No nausea, vomiting, or abdominal pain.   Dysphagia: Improved with lifestyle adjustment.   Constipation: Well controlled with Linzess. No brbpr or melena.   Past Medical History:  Diagnosis Date   Anxiety    Arthritis    Balance problems    Chronic abdominal pain    Chronic knee pain     Constipation    Difficulty walking    GERD (gastroesophageal reflux disease)    Headache    HTN (hypertension)    Low back pain    Panic attacks    Polyuria    Seizures ([place]    unknown etiology; on dilantin; last seizures was a few years ago.   Sleep apnea     Past Surgical History:  Procedure Laterality Date   BACK SURGERY     complicated by ureteral injury   CHONDROPLASTY Right 08/31/2016   Procedure: CHONDROPLASTY RIGHT FEMUR;  Surgeon: HCarole Civil MD;  Location: [facility];  Service: Orthopedics;  Laterality: Right;   COLONOSCOPY WITH PROPOFOL N/A 12/19/2019   Procedure: COLONOSCOPY WITH PROPOFOL;  Surgeon: RDaneil Dolin Nonbleeding external and internal grade 3 hemorrhoids, three 2-864mpolyps, otherwise normal.  Suspected benign anorectal bleeding.  Pathology revealed hyperplastic polyps.  Repeat colonoscopy in 10 years.   ESOPHAGOGASTRODUODENOSCOPY N/A 09/11/2015   Procedure: ESOPHAGOGASTRODUODENOSCOPY (EGD);  Surgeon: RoDaneil DolinMD;  Normal esophagus s/p dilation, few gastric polyps resected and retrieved, normal duodenum. Gastric polyps benign.    ESOPHAGOGASTRODUODENOSCOPY (EGD) WITH ESOPHAGEAL DILATION N/A 03/20/2013   Dr. Rourk:Normal EGD-status post passage of a Maloney dilator/Status post esophageal biopsy., benign path   ESOPHAGOGASTRODUODENOSCOPY (EGD) WITH PROPOFOL N/A 12/19/2019   Procedure: ESOPHAGOGASTRODUODENOSCOPY (EGD) WITH PROPOFOL;  Surgeon: RoDaneil DolinMD;  Normal esophagus s/p dilation, benign-appearing fundic gland polyps, normal examined duodenum.   KNEE ARTHROSCOPY WITH MEDIAL MENISECTOMY Right 08/31/2016   Procedure: KNEE ARTHROSCOPY WITH MEDIAL MENISECTOMY;  Surgeon: HaCarole CivilMD;  Location: [facility];  Service: Orthopedics;  Laterality: Right;   KNEE ARTHROSCOPY WITH MEDIAL MENISECTOMY Right 09/13/2018   Procedure: KNEE ARTHROSCOPY WITH MEDIAL MENISCECTOMY AND MICROFRACTURE;  Surgeon: Carole Civil, MD;  Location: [facility];   Service: Orthopedics;  Laterality: Right;   MALONEY DILATION N/A 09/11/2015   Procedure: Venia Minks DILATION;  Surgeon: Daneil Dolin, MD;  [facility];  Service: Endoscopy;  Laterality: N/A;   MALONEY DILATION N/A 12/19/2019   Procedure: Venia Minks DILATION;  Surgeon: Daneil Dolin, MD;  [facility];  Service: Endoscopy;  Laterality: N/A;   POLYPECTOMY  12/19/2019   Procedure: POLYPECTOMY;  Surgeon: Daneil Dolin, MD;  [facility];  Service: Endoscopy;;    Current Outpatient Medications  Medication Sig Dispense Refill   ALPRAZolam (XANAX) 0.5 MG tablet Take 0.5 mg by mouth 2 (two) times daily as needed for anxiety.   5   amLODipine (NORVASC) 10 MG tablet Take 10 mg by mouth daily at 2 PM.      atorvastatin (LIPITOR) 40 MG tablet Take 1 tablet by mouth daily.     DULoxetine (CYMBALTA) 30 MG capsule Take 30 mg by mouth daily.     estradiol (ESTRACE) 0.5 MG tablet TAKE 1 TABLET BY MOUTH DAILY 30 tablet 11   ezetimibe (ZETIA) 10 MG tablet Take 10 mg by mouth daily.     fluticasone (FLONASE) 50 MCG/ACT nasal spray Place 2 sprays into both nostrils daily.     irbesartan (AVAPRO) 300 MG tablet Take 300 mg by mouth daily at 2 PM.   12   LINZESS 145 MCG CAPS capsule TAKE ONE CAPSULE BY MOUTH EVERY DAY 30 capsule 5   metoprolol succinate (TOPROL-XL) 25 MG 24 hr tablet Take 1 tablet by mouth daily.     Omega-3 Fatty Acids (FISH OIL) 1000 MG CAPS Take 2,000 mg by mouth at bedtime.      ondansetron (ZOFRAN) 4 MG tablet Take 1 tablet (4 mg total) by mouth every 6 (six) hours. (Patient taking differently: Take 4 mg by mouth as needed for nausea or vomiting.) 12 tablet 0   oxyCODONE-acetaminophen (PERCOCET/ROXICET) 5-325 MG tablet Take 1 tablet by mouth every 4 (four) hours as needed for severe pain (pain.).      pantoprazole (PROTONIX) 40 MG tablet Take 1 tablet (40 mg total) by mouth 2 (two) times daily. 180 tablet 3   phenytoin (DILANTIN) 100 MG ER capsule Take 200 mg by  mouth in the morning and at bedtime.      Probiotic Product (PROBIOTIC-10 PO) Take 1 capsule by mouth daily.     progesterone (PROMETRIUM) 100 MG capsule Take 1 capsule (100 mg total) by mouth See admin instructions. Take 1 capsule (100 mg) by mouth daily x 14 days every 3 months 14 capsule 6   rosuvastatin (CRESTOR) 5 MG tablet Take 5 mg by mouth daily.     tiZANidine (ZANAFLEX) 4 MG tablet Take 1 tablet (4 mg total) by mouth every 6 (six) hours as needed for muscle spasms. 30 tablet 0   Vibegron (GEMTESA) 75 MG TABS Take 1 tablet by mouth daily.     No current facility-administered medications for this visit.    Allergies as of 09/17/2020 - Review Complete 09/17/2020  Allergen Reaction Noted   Benadryl [diphenhydramine hcl] Rash 01/17/2012    Family History  Problem Relation Age of Onset   Throat cancer Father    Heart disease Father    Cancer Father    Hypertension Father  Diabetes Father    Other Mother        esophageal stricture and gallbladder disease   Thyroid disease Mother    Other Sister        esophageal stricture and gallbladder problems.    Diabetes Sister    Hypertension Sister    Thyroid disease Sister    Heart disease Maternal Grandfather    Diabetes Maternal Grandfather    Arthritis Maternal Grandfather    Colon cancer Neg Hx    Liver disease Neg Hx    Pancreatic cancer Neg Hx     Social History   Socioeconomic History   Marital status: Single    Spouse name: Not on file   Number of children: 0   Years of education: 12   Highest education level: Not on file  Occupational History   Occupation: disability    Employer: DISABLED  Tobacco Use   Smoking status: Former    Packs/day: 2.00    Years: 9.00    Pack years: 18.00    Types: Cigarettes    Quit date: 04/04/1994    Years since quitting: 26.4   Smokeless tobacco: Never   Tobacco comments:    smoked 8 years, quit 18 years ago in the 1996  Vaping Use   Vaping Use: Never used  Substance and  Sexual Activity   Alcohol use: No    Alcohol/week: 0.0 standard drinks   Drug use: No   Sexual activity: Not Currently    Birth control/protection: None  Other Topics Concern   Not on file  Social History Narrative   Not on file   Social Determinants of Health   Financial Resource Strain: Not on file  Food Insecurity: Not on file  Transportation Needs: Not on file  Physical Activity: Not on file  Stress: Not on file  Social Connections: Not on file    Review of Systems: Gen: Denies fever, chills, cold or flulike symptoms, presyncope, syncope. CV: Denies chest pain or palpitations. Resp: Denies dyspnea or cough. GI: See HPI Heme: See HPI  Physical Exam: BP (!) 124/94   Pulse 92   Temp (!) 97.1 F (36.2 C) (Temporal)   Ht 5' 6"  (1.676 m)   Wt 177 lb (80.3 kg)   LMP 07/06/2018   BMI 28.57 kg/m  General:   Alert and oriented. No distress noted. Pleasant and cooperative.  Head:  Normocephalic and atraumatic. Eyes:  Conjuctiva clear without scleral icterus. Heart:  S1, S2 present without murmurs appreciated. Lungs:  Clear to auscultation bilaterally. No wheezes, rales, or rhonchi. No distress.  Abdomen:  +BS, soft, non-tender and non-distended. No rebound or guarding. No HSM or masses noted. Msk:  Symmetrical without gross deformities. Normal posture. Extremities:  Without edema. Neurologic:  Alert and  oriented x4 Psych:  Normal mood and affect.    Assessment: 50 year old female with history of GERD, dysphagia, hemorrhoids, intermittent rectal bleeding, constipation presenting today for 70-monthfollow-up.  Colonoscopy up-to-date in September 2021, due for repeat in 2031.  GERD: Well-controlled on Protonix 40 mg twice daily which she has been on for several years.  No alarm symptoms.  Advised she may try reducing Protonix to once daily and monitor for breakthrough symptoms.  If she has breakthrough 2-3 times a week, she is to increase Protonix back to twice a day.    Dysphagia: Likely secondary to mild esophageal dysmotility.  Underwent EGD in September 2021 with normal-appearing esophagus s/p empiric dilation.  No significant improvement.  Follow-up  BPE January 2022 with no esophageal abnormalities, mild age-related dysmotility with incomplete clearance of barium by primary peristaltic waves, 12.5 mm barium tablet easily passed into the stomach without obstruction.  Her symptoms are well controlled with dietary/lifestyle adjustments.  No need for further evaluation at this time.  Constipation: Well-controlled on Linzess 145 mcg daily.  No alarm symptoms.   Plan: 1.  Try reducing Protonix to 40 mg once daily 30 minutes before breakfast.  If breakthrough symptoms occur 2-3 times per week, may resume Protonix 40 mg twice daily.  2.  Reinforce GERD diet/lifestyle.  Written instructions provided.  3.  Avoid tough textures, meats should be chopped finely, eat slowly, take small bites, chew thoroughly, drink plenty of liquids throughout meals.  4.  She is to let me know of any worsening dysphagia symptoms.  5.  Continue Linzess 145 mcg daily 30 minutes before first meal.  6.  Follow-up in 1 year or sooner if needed.    Aliene Altes, PA-C [facility] 09/17/2020

## 2020-09-17 ENCOUNTER — Ambulatory Visit (INDEPENDENT_AMBULATORY_CARE_PROVIDER_SITE_OTHER): Payer: Medicare Other | Admitting: Orthopedic Surgery

## 2020-09-17 ENCOUNTER — Other Ambulatory Visit: Payer: Self-pay

## 2020-09-17 ENCOUNTER — Encounter: Payer: Self-pay | Admitting: Gastroenterology

## 2020-09-17 ENCOUNTER — Other Ambulatory Visit: Payer: Self-pay | Admitting: Student

## 2020-09-17 ENCOUNTER — Ambulatory Visit (INDEPENDENT_AMBULATORY_CARE_PROVIDER_SITE_OTHER): Payer: Medicare Other | Admitting: Gastroenterology

## 2020-09-17 VITALS — BP 121/93 | HR 104 | Ht 66.0 in | Wt 176.0 lb

## 2020-09-17 VITALS — BP 124/94 | HR 92 | Temp 97.1°F | Ht 66.0 in | Wt 177.0 lb

## 2020-09-17 DIAGNOSIS — K5900 Constipation, unspecified: Secondary | ICD-10-CM

## 2020-09-17 DIAGNOSIS — M545 Low back pain, unspecified: Secondary | ICD-10-CM

## 2020-09-17 DIAGNOSIS — R1319 Other dysphagia: Secondary | ICD-10-CM | POA: Diagnosis not present

## 2020-09-17 DIAGNOSIS — K219 Gastro-esophageal reflux disease without esophagitis: Secondary | ICD-10-CM | POA: Diagnosis not present

## 2020-09-17 DIAGNOSIS — S5001XA Contusion of right elbow, initial encounter: Secondary | ICD-10-CM | POA: Diagnosis not present

## 2020-09-17 NOTE — Progress Notes (Signed)
NEW PROBLEM//OFFICE VISIT  Summary assessment and plan:   LATERAL ELBOW PAIN POST BLUNT TRAUMA: PROBABLY CONTUSION VS NERVE CONTUSION  REC SYMPTOMATIC TREATMENT   Chief Complaint  Patient presents with   Elbow Pain    Right elbow pain, since April.   HPI Patient is a 50 year old female who is right-hand dominant presents to the ED due to right elbow pain.  She states her symptoms started about 2 weeks ago.  She was starting a weed eater with her right arm and when pulling backwards the right elbow struck a wooden 2 x 4.  She states that she has had constant moderate pain in the region.  She has been taking ibuprofen as well as Tylenol with minimal relief.  Also notes applying ice, heat, as well as Voltaren gel.  She states heat provides mild relief but otherwise gets no relief from Voltaren gel or ice.  Denies any numbness, tingling, or weakness.  Pain worsens with palpation of the posterior elbow as well as range of motion of the joint.  Reports mild swelling in the joint.  No other complaints.  She is followed by Dr. Aline Brochure with orthopedics.   C/O OF LATERAL ELBOW PAIN AND SWELLING  MEDICAL DECISION MAKING  A.  Encounter Diagnosis  Name Primary?   Contusion of right elbow, initial encounter Yes    B. DATA ANALYSED:   IMAGING: Interpretation of images: EXTERNAL XRAYS: I SEE A POSSIBLE LATERAL ELBOW CHIP   Orders: NONE  Outside records reviewed: YES    C. MANAGEMENT   HEAT 3 TIMES A DAY  TOPICAL MEDS   No orders of the defined types were placed in this encounter.    BP (!) 121/93   Pulse (!) 104   Ht 5' 6"  (1.676 m)   Wt 176 lb (79.8 kg)   LMP 07/06/2018   BMI 28.41 kg/m    General appearance: Well-developed well-nourished no gross deformities  Cardiovascular normal pulse and perfusion normal color without edema  Neurologically no sensation loss or deficits or pathologic reflexes  Psychological: Awake alert and oriented x3 mood and affect normal  Skin  no lacerations or ulcerations no nodularity no palpable masses, no erythema or nodularity  Musculoskeletal:   RIGHT ELBOW LATERAL PAIN SWELLING  PAINFUL WRIST EXTENSION    Review of Systems  Constitutional:  Negative for fever.  Neurological:  Positive for tingling.    Past Medical History:  Diagnosis Date   Anxiety    Arthritis    Balance problems    Chronic abdominal pain    Chronic knee pain    Constipation    Difficulty walking    GERD (gastroesophageal reflux disease)    Headache    HTN (hypertension)    Low back pain    Panic attacks    Polyuria    Seizures ([place])    unknown etiology; on dilantin; last seizures was a few years ago.   Sleep apnea     Past Surgical History:  Procedure Laterality Date   BACK SURGERY     complicated by ureteral injury   CHONDROPLASTY Right 08/31/2016   Procedure: CHONDROPLASTY RIGHT FEMUR;  Surgeon: Carole Civil, MD;  Location: [facility];  Service: Orthopedics;  Laterality: Right;   COLONOSCOPY WITH PROPOFOL N/A 12/19/2019   Procedure: COLONOSCOPY WITH PROPOFOL;  Surgeon: Daneil Dolin, Nonbleeding external and internal grade 3 hemorrhoids, three 2-72m polyps, otherwise normal.  Suspected benign anorectal bleeding.  Pathology revealed hyperplastic polyps.  Repeat colonoscopy in 10  years.   ESOPHAGOGASTRODUODENOSCOPY N/A 09/11/2015   Procedure: ESOPHAGOGASTRODUODENOSCOPY (EGD);  Surgeon: Daneil Dolin, MD;  Normal esophagus s/p dilation, few gastric polyps resected and retrieved, normal duodenum. Gastric polyps benign.    ESOPHAGOGASTRODUODENOSCOPY (EGD) WITH ESOPHAGEAL DILATION N/A 03/20/2013   Dr. Rourk:Normal EGD-status post passage of a Maloney dilator/Status post esophageal biopsy., benign path   ESOPHAGOGASTRODUODENOSCOPY (EGD) WITH PROPOFOL N/A 12/19/2019   Procedure: ESOPHAGOGASTRODUODENOSCOPY (EGD) WITH PROPOFOL;  Surgeon: Daneil Dolin, MD;  Normal esophagus s/p dilation, benign-appearing fundic gland polyps, normal  examined duodenum.   KNEE ARTHROSCOPY WITH MEDIAL MENISECTOMY Right 08/31/2016   Procedure: KNEE ARTHROSCOPY WITH MEDIAL MENISECTOMY;  Surgeon: Carole Civil, MD;  Location: [facility];  Service: Orthopedics;  Laterality: Right;   KNEE ARTHROSCOPY WITH MEDIAL MENISECTOMY Right 09/13/2018   Procedure: KNEE ARTHROSCOPY WITH MEDIAL MENISCECTOMY AND MICROFRACTURE;  Surgeon: Carole Civil, MD;  Location: [facility];  Service: Orthopedics;  Laterality: Right;   MALONEY DILATION N/A 09/11/2015   Procedure: Venia Minks DILATION;  Surgeon: Daneil Dolin, MD;  [facility];  Service: Endoscopy;  Laterality: N/A;   MALONEY DILATION N/A 12/19/2019   Procedure: Venia Minks DILATION;  Surgeon: Daneil Dolin, MD;  [facility];  Service: Endoscopy;  Laterality: N/A;   POLYPECTOMY  12/19/2019   Procedure: POLYPECTOMY;  Surgeon: Daneil Dolin, MD;  [facility];  Service: Endoscopy;;    Family History  Problem Relation Age of Onset   Throat cancer Father    Heart disease Father    Cancer Father    Hypertension Father    Diabetes Father    Other Mother        esophageal stricture and gallbladder disease   Thyroid disease Mother    Other Sister        esophageal stricture and gallbladder problems.    Diabetes Sister    Hypertension Sister    Thyroid disease Sister    Heart disease Maternal Grandfather    Diabetes Maternal Grandfather    Arthritis Maternal Grandfather    Colon cancer Neg Hx    Liver disease Neg Hx    Pancreatic cancer Neg Hx    Social History   Tobacco Use   Smoking status: Former    Packs/day: 2.00    Years: 9.00    Pack years: 18.00    Types: Cigarettes    Quit date: 04/04/1994    Years since quitting: 26.4   Smokeless tobacco: Never   Tobacco comments:    smoked 8 years, quit 18 years ago in the 1996  Vaping Use   Vaping Use: Never used  Substance Use Topics   Alcohol use: No    Alcohol/week: 0.0 standard drinks   Drug use: No     Allergies  Allergen Reactions   Benadryl [Diphenhydramine Hcl] Rash    Current Meds  Medication Sig   ALPRAZolam (XANAX) 0.5 MG tablet Take 0.5 mg by mouth 2 (two) times daily as needed for anxiety.    amLODipine (NORVASC) 10 MG tablet Take 10 mg by mouth daily at 2 PM.    DULoxetine (CYMBALTA) 30 MG capsule Take 30 mg by mouth daily.   estradiol (ESTRACE) 0.5 MG tablet TAKE 1 TABLET BY MOUTH DAILY   ezetimibe (ZETIA) 10 MG tablet Take 10 mg by mouth daily.   fluticasone (FLONASE) 50 MCG/ACT nasal spray Place 2 sprays into both nostrils daily.   irbesartan (AVAPRO) 300 MG tablet Take 300 mg by mouth daily at  2 PM.    LINZESS 145 MCG CAPS capsule TAKE ONE CAPSULE BY MOUTH EVERY DAY   Omega-3 Fatty Acids (FISH OIL) 1000 MG CAPS Take 2,000 mg by mouth at bedtime.    ondansetron (ZOFRAN) 4 MG tablet Take 1 tablet (4 mg total) by mouth every 6 (six) hours. (Patient taking differently: Take 4 mg by mouth as needed for nausea or vomiting.)   oxyCODONE-acetaminophen (PERCOCET/ROXICET) 5-325 MG tablet Take 1 tablet by mouth every 4 (four) hours as needed for severe pain (pain.).    pantoprazole (PROTONIX) 40 MG tablet Take 1 tablet (40 mg total) by mouth 2 (two) times daily.   phenytoin (DILANTIN) 100 MG ER capsule Take 200 mg by mouth in the morning and at bedtime.    Probiotic Product (PROBIOTIC-10 PO) Take 1 capsule by mouth daily.   progesterone (PROMETRIUM) 100 MG capsule Take 1 capsule (100 mg total) by mouth See admin instructions. Take 1 capsule (100 mg) by mouth daily x 14 days every 3 months   rosuvastatin (CRESTOR) 5 MG tablet Take 5 mg by mouth daily.   tiZANidine (ZANAFLEX) 4 MG tablet Take 1 tablet (4 mg total) by mouth every 6 (six) hours as needed for muscle spasms.        Arther Abbott, MD  09/17/2020 9:35 AM

## 2020-09-17 NOTE — Patient Instructions (Signed)
Heat 20 min 3x a day   Topical ben gay

## 2020-09-17 NOTE — Patient Instructions (Signed)
Continue Linzess 145 mcg daily 30 minutes before your first meal.  You may try decreasing Protonix to once a day and monitor your reflux symptoms.  If you have return of reflux 2 or 3 times per week, you may increase Protonix back to twice a day.  Follow a GERD diet:  Avoid fried, fatty, greasy, spicy, citrus foods. Avoid caffeine and carbonated beverages. Avoid chocolate. Try eating 4-6 small meals a day rather than 3 large meals. Do not eat within 3 hours of laying down. Prop head of bed up on wood or bricks to create a 6 inch incline.  For swallowing difficulties: Avoid tough textures. All meats should be chopped finely. Eat slowly, take small bites, chew thoroughly, drink plenty of liquids throughout your meals. Let me know if any worsening symptoms.   We will plan to follow-up with you in 1 year.  Do not hesitate to call if you have any questions or concerns prior to your next visit.   It was great to see you today!  I hope you have a great summer!   Aliene Altes, PA-C [facility]

## 2020-09-27 ENCOUNTER — Ambulatory Visit
Admission: RE | Admit: 2020-09-27 | Discharge: 2020-09-27 | Disposition: A | Discharge status: Home / Self Care | Payer: Medicare Other | Source: Ambulatory Visit | Attending: Student | Admitting: Student

## 2020-09-27 ENCOUNTER — Other Ambulatory Visit: Payer: Self-pay

## 2020-09-27 DIAGNOSIS — M545 Low back pain, unspecified: Secondary | ICD-10-CM

## 2020-09-29 DIAGNOSIS — M545 Low back pain, unspecified: Secondary | ICD-10-CM | POA: Diagnosis not present

## 2020-10-02 ENCOUNTER — Other Ambulatory Visit: Payer: Self-pay | Admitting: Orthopedic Surgery

## 2020-10-02 DIAGNOSIS — M5416 Radiculopathy, lumbar region: Secondary | ICD-10-CM | POA: Insufficient documentation

## 2020-10-02 DIAGNOSIS — M545 Low back pain, unspecified: Secondary | ICD-10-CM

## 2020-10-12 ENCOUNTER — Other Ambulatory Visit: Payer: Self-pay

## 2020-10-12 ENCOUNTER — Ambulatory Visit
Admission: RE | Admit: 2020-10-12 | Discharge: 2020-10-12 | Disposition: A | Discharge status: Home / Self Care | Payer: Medicare Other | Source: Ambulatory Visit | Attending: Orthopedic Surgery | Admitting: Orthopedic Surgery

## 2020-10-12 DIAGNOSIS — G8929 Other chronic pain: Secondary | ICD-10-CM

## 2020-10-12 DIAGNOSIS — M5418 Radiculopathy, sacral and sacrococcygeal region: Secondary | ICD-10-CM | POA: Diagnosis not present

## 2020-10-12 MED ORDER — IOPAMIDOL (ISOVUE-M 200) INJECTION 41%
1.0000 mL | Freq: Once | INTRAMUSCULAR | Status: AC
  Administered 2020-10-12: 1 mL via EPIDURAL

## 2020-10-12 MED ORDER — METHYLPREDNISOLONE ACETATE 40 MG/ML INJ SUSP (RADIOLOG
80.0000 mg | Freq: Once | INTRAMUSCULAR | Status: AC
  Administered 2020-10-12: 80 mg via EPIDURAL

## 2020-10-12 NOTE — Discharge Instructions (Signed)

## 2020-10-17 ENCOUNTER — Other Ambulatory Visit: Payer: Self-pay | Admitting: Gastroenterology

## 2020-10-17 DIAGNOSIS — K219 Gastro-esophageal reflux disease without esophagitis: Secondary | ICD-10-CM

## 2020-11-27 DIAGNOSIS — U071 COVID-19: Secondary | ICD-10-CM | POA: Diagnosis not present

## 2020-11-30 DIAGNOSIS — M545 Low back pain, unspecified: Secondary | ICD-10-CM | POA: Diagnosis not present

## 2020-12-12 ENCOUNTER — Other Ambulatory Visit: Payer: Self-pay | Admitting: Gastroenterology

## 2020-12-15 ENCOUNTER — Other Ambulatory Visit: Payer: Self-pay

## 2020-12-15 ENCOUNTER — Ambulatory Visit (HOSPITAL_COMMUNITY): Payer: Medicare Other | Attending: Orthopedic Surgery | Admitting: Physical Therapy

## 2020-12-15 DIAGNOSIS — M6281 Muscle weakness (generalized): Secondary | ICD-10-CM | POA: Diagnosis not present

## 2020-12-15 DIAGNOSIS — M5416 Radiculopathy, lumbar region: Secondary | ICD-10-CM | POA: Diagnosis not present

## 2020-12-15 NOTE — Therapy (Signed)
[name], [place], [address] Phone: [phone]   Fax:  [phone]  Physical Therapy Evaluation  Patient Details  Name: [name] MRN: [id number] Date of Birth: 16-Aug-1970 Referring Provider (PT): Melina Schools   Encounter Date: 12/15/2020   PT End of Session - 12/15/20 0918     Visit Number 1    Number of Visits 12    Date for PT Re-Evaluation 01/26/21    Authorization Type [employer] medicare/caid    Progress Note Due on Visit 10    PT Start Time 0830    PT Stop Time 0910    PT Time Calculation (min) 40 min    Activity Tolerance Patient tolerated treatment well    Behavior During Therapy [facility] for tasks assessed/performed             Past Medical History:  Diagnosis Date   Anxiety    Arthritis    Balance problems    Chronic abdominal pain    Chronic knee pain    Constipation    Difficulty walking    GERD (gastroesophageal reflux disease)    Headache    HTN (hypertension)    Low back pain    Panic attacks    Polyuria    Seizures ([place])    unknown etiology; on dilantin; last seizures was a few years ago.   Sleep apnea     Past Surgical History:  Procedure Laterality Date   BACK SURGERY     complicated by ureteral injury   CHONDROPLASTY Right 08/31/2016   Procedure: CHONDROPLASTY RIGHT FEMUR;  Surgeon: Carole Civil, MD;  Location: [facility];  Service: Orthopedics;  Laterality: Right;   COLONOSCOPY WITH PROPOFOL N/A 12/19/2019   Procedure: COLONOSCOPY WITH PROPOFOL;  Surgeon: Daneil Dolin, Nonbleeding external and internal grade 3 hemorrhoids, three 2-15m polyps, otherwise normal.  Suspected benign anorectal bleeding.  Pathology revealed hyperplastic polyps.  Repeat colonoscopy in 10 years.   ESOPHAGOGASTRODUODENOSCOPY N/A 09/11/2015   Procedure: ESOPHAGOGASTRODUODENOSCOPY (EGD);  Surgeon: RDaneil Dolin MD;  Normal esophagus s/p dilation, few gastric polyps resected and retrieved,  normal duodenum. Gastric polyps benign.    ESOPHAGOGASTRODUODENOSCOPY (EGD) WITH ESOPHAGEAL DILATION N/A 03/20/2013   Dr. Rourk:Normal EGD-status post passage of a Maloney dilator/Status post esophageal biopsy., benign path   ESOPHAGOGASTRODUODENOSCOPY (EGD) WITH PROPOFOL N/A 12/19/2019   Procedure: ESOPHAGOGASTRODUODENOSCOPY (EGD) WITH PROPOFOL;  Surgeon: RDaneil Dolin MD;  Normal esophagus s/p dilation, benign-appearing fundic gland polyps, normal examined duodenum.   KNEE ARTHROSCOPY WITH MEDIAL MENISECTOMY Right 08/31/2016   Procedure: KNEE ARTHROSCOPY WITH MEDIAL MENISECTOMY;  Surgeon: HCarole Civil MD;  Location: [facility];  Service: Orthopedics;  Laterality: Right;   KNEE ARTHROSCOPY WITH MEDIAL MENISECTOMY Right 09/13/2018   Procedure: KNEE ARTHROSCOPY WITH MEDIAL MENISCECTOMY AND MICROFRACTURE;  Surgeon: HCarole Civil MD;  Location: [facility];  Service: Orthopedics;  Laterality: Right;   MALONEY DILATION N/A 09/11/2015   Procedure: MVenia MinksDILATION;  Surgeon: RDaneil Dolin MD;  [facility];  Service: Endoscopy;  Laterality: N/A;   MALONEY DILATION N/A 12/19/2019   Procedure: MVenia MinksDILATION;  Surgeon: RDaneil Dolin MD;  [facility];  Service: Endoscopy;  Laterality: N/A;   POLYPECTOMY  12/19/2019   Procedure: POLYPECTOMY;  Surgeon: RDaneil Dolin MD;  [facility];  Service: Endoscopy;;    There were no vitals filed for this visit.    Subjective Assessment - 12/15/20 05643  Subjective [name] had bac surgery in 2008.  She has had back pain on and off but in May she began having more pain just about everyday.  She is not having pain going down to the heel of her Lt foot. She is having difficuty sleeping she is getting about 6 hours of sleep a night.    Pertinent History HTN, prior back surgery    Limitations Sitting;Lifting;Reading;Standing;Walking;House hold activities    How long can you sit comfortably? Almost immediatly sits on  right side, if she equalizes her weight she would have to get up in less than five minutes.    How long can you stand comfortably? able to stand for five minutes    How long can you walk comfortably? Able to walk  for 10 minutes    Patient Stated Goals to get back to where she  was so she can do more.    Currently in Pain? Yes    Pain Score 7    worst 10; best 6   Pain Location Back    Pain Orientation Lower    Pain Descriptors / Indicators Aching    Pain Type Chronic pain    Pain Radiating Towards LT buttock goes to heel occasionally    Pain Onset More than a month ago    Pain Frequency Constant    Aggravating Factors  sitting    Pain Relieving Factors ice, meds    Effect of Pain on Daily Activities limits                [facility] PT Assessment - 12/15/20 0001       Assessment   Medical Diagnosis lumbar radiculopathy    Referring Provider (PT) Melina Schools    Onset Date/Surgical Date --   2008   Next MD Visit 01/12/2021    Prior Therapy not for this episode      Precautions   Precautions None      Restrictions   Weight Bearing Restrictions No      Balance Screen   Has the patient fallen in the past 6 months No    Has the patient had a decrease in activity level because of a fear of falling?  Yes    Is the patient reluctant to leave their home because of a fear of falling?  No      Home Ecologist residence      Prior Function   Level of Independence Independent      Cognition   Overall Cognitive Status Within Functional Limits for tasks assessed      Observation/Other Assessments   Focus on Therapeutic Outcomes (FOTO)  44; 56% affected      Functional Tests   Functional tests Single leg stance;Sit to Stand      Single Leg Stance   Comments Rt:  8";  Lt: Lt: 17"      Sit to Stand   Comments 5 in 30 seconds      Posture/Postural Control   Posture/Postural Control Postural limitations    Postural Limitations Rounded  Shoulders;Decreased lumbar lordosis;Increased thoracic kyphosis      ROM / Strength   AROM / PROM / Strength AROM;Strength      AROM   AROM Assessment Site Lumbar    Lumbar Flexion --   fingers to knees   Lumbar Extension 25   no change     Strength   Strength Assessment Site Hip;Knee;Ankle    Right/Left  Hip Right;Left    Right Hip Flexion 4/5    Right Hip Extension 5/5    Right Hip ABduction 5/5    Left Hip Flexion 4/5    Left Hip Extension 3/5    Left Hip ABduction 4+/5    Right/Left Knee Right;Left    Right Knee Flexion 5/5    Right Knee Extension 5/5    Left Knee Flexion 4/5    Left Knee Extension 4-/5    Right/Left Ankle Right;Left    Right Ankle Dorsiflexion 5/5    Left Ankle Dorsiflexion 4/5      Flexibility   Soft Tissue Assessment /Muscle Length yes    Hamstrings Rt 150; Lt 138      Ambulation/Gait   Ambulation Distance (Feet) 339 Feet    Gait Comments noted limp  2 MTW                        Objective measurements completed on examination: See above findings.       [facility] PT Treatment/Exercise - 12/15/20 0001       Exercises   Exercises Lumbar      Lumbar Exercises: Stretches   Active Hamstring Stretch Left;Right;2 reps;30 seconds    Standing Extension 10 reps    Prone on Elbows Stretch 2 reps;60 seconds                     PT Education - 12/15/20 0917     Education Details HEP, proper mechanics for bed mobility    Person(s) Educated Patient    Comprehension Verbalized understanding;Returned demonstration              PT Short Term Goals - 12/15/20 0941       PT SHORT TERM GOAL #1   Title Pt  to I in HEP to allow pt's radicular sx to be no further than the knee level to demonstrate less nerve irritation.    Time 3    Period Weeks    Status New    Target Date 01/05/21      PT SHORT TERM GOAL #2   Title PT to be able to sit with equal WB for 15 minutes to be able to eat a quick meal    Time 3     Period Weeks    Status New      PT SHORT TERM GOAL #3   Title Pt LE strength to be increased 1/2 grade to allow pt to come sit to stand without increased pain    Time 3    Period Weeks    Status New      PT SHORT TERM GOAL #4   Title Pt pain to be no greater than a 7/10 to allow pt to sleep at least 7 hr a night    Time 3    Period Weeks    Status New               PT Long Term Goals - 12/15/20 0946       PT LONG TERM GOAL #1   Title Pt radicular sx to be no further than the buttock area    Time 6    Period Weeks    Status New    Target Date 01/26/21      PT LONG TERM GOAL #2   Title Pt  Single leg stance to be at least 20" on both LE to reduce risk  of fall    Time 6    Period Weeks    Status New      PT LONG TERM GOAL #3   Title PT to be I in HEP to reduce pain to no greater than a 5/10 to allow a full night of sleep    Time 6    Period Weeks    Status New      PT LONG TERM GOAL #4   Title LE strength to be increased one grade to be able to go up and down 12 steps with no dificulty.    Time 6    Period Weeks    Status New      PT LONG TERM GOAL #5   Title PT to be able to walk for 30 minutes without increased pain to allow shopping trips.    Time 6    Period Weeks    Status NEw                    Plan - 12/15/20 0918     Clinical Impression Statement Ms. Doan is a 50 yo female who has chronic back pain since a back surgery in 2008.  Since May, however, she has been having increased progressive back pain which is limiting her functional activity.  She has been referred to skilled PT.  Evaluation demonstrates increased pain, decreased balance, decreased strength and decrease ROM.  Ms. Cullifer will benefit from skilled PT to address these items and improve her functional ability.    Personal Factors and Comorbidities Fitness;Time since onset of injury/illness/exacerbation;Comorbidity 3+    Comorbidities past back surgery, HTN, OA     Examination-Activity Limitations Bend;Carry;Dressing;Lift;Locomotion Level;Stairs;Squat;Sleep;Sit;Stand    Examination-Participation Restrictions Cleaning;Driving;Laundry;Shop    Stability/Clinical Decision Making Evolving/Moderate complexity    Clinical Decision Making Moderate    Rehab Potential Good    PT Frequency 2x / week    PT Duration 6 weeks    PT Treatment/Interventions ADLs/Self Care Home Management;Balance training;Functional mobility training;Therapeutic activities;Therapeutic exercise;Manual techniques    PT Next Visit Plan begin stabilization to include ab set, clam, bridge, hip extension.    PT Home Exercise Plan eval, standing extension, POE and active hamstring stretch             Patient will benefit from skilled therapeutic intervention in order to improve the following deficits and impairments:  Abnormal gait, Decreased activity tolerance, Decreased balance, Decreased mobility, Decreased range of motion, Difficulty walking, Decreased strength, Postural dysfunction, Impaired flexibility, Pain  Visit Diagnosis: Radiculopathy, lumbar region - Plan: PT plan of care cert/re-cert  Muscle weakness (generalized) - Plan: PT plan of care cert/re-cert     Problem List Patient Active Problem List   Diagnosis Date Noted   Rectal bleeding 08/12/2019   S/P right knee arthroscopy *with microfracture 09/13/18 09/18/2018   Acute medial meniscus tear of right knee    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    Derangement of posterior horn of medial meniscus of right knee    Primary osteoarthritis of right knee    Gastric polyp    Gastroesophageal reflux disease 08/24/2015   Hypoactive sexual desire disorder 05/14/2014   Abdominal pain, chronic, epigastric 12/08/2013   Constipation 12/08/2013   Esophageal dysphagia 02/22/2013   Abdominal pain, epigastric 02/22/2013   LUQ pain 02/22/2013   Difficulty in walking(719.7) 01/25/2012   Stiffness of joint, not elsewhere  classified, ankle and foot 01/25/2012   Balance problems 01/25/2012   Ankle sprain  01/17/2012   Ankle pain, right 01/17/2012  Rayetta Humphrey, PT CLT [phone]  12/15/2020, 9:57 AM  [facility] [address] [place], [place], [address] Phone: [phone]   Fax:  [phone]  Name: [name] MRN: [id number] Date of Birth: 10/25/1970

## 2020-12-18 ENCOUNTER — Ambulatory Visit (HOSPITAL_COMMUNITY): Payer: Medicare Other

## 2020-12-20 ENCOUNTER — Encounter: Payer: Self-pay | Admitting: Emergency Medicine

## 2020-12-20 ENCOUNTER — Other Ambulatory Visit: Payer: Self-pay

## 2020-12-20 ENCOUNTER — Ambulatory Visit
Admission: EM | Admit: 2020-12-20 | Discharge: 2020-12-20 | Disposition: A | Discharge status: Home / Self Care | Payer: Medicare Other | Attending: Family Medicine | Admitting: Family Medicine

## 2020-12-20 DIAGNOSIS — M5442 Lumbago with sciatica, left side: Secondary | ICD-10-CM | POA: Diagnosis not present

## 2020-12-20 MED ORDER — PREDNISONE 20 MG PO TABS: 40.0000 mg | ORAL_TABLET | Freq: Every day | ORAL | 0 refills | Status: DC

## 2020-12-20 MED ORDER — TIZANIDINE HCL 4 MG PO TABS: 4.0000 mg | ORAL_TABLET | Freq: Three times a day (TID) | ORAL | 0 refills | Status: DC | PRN

## 2020-12-20 MED ORDER — KETOROLAC TROMETHAMINE 30 MG/ML IJ SOLN
30.0000 mg | Freq: Once | INTRAMUSCULAR | Status: AC
  Administered 2020-12-20: 30 mg via INTRAMUSCULAR

## 2020-12-20 MED ORDER — DEXAMETHASONE SODIUM PHOSPHATE 10 MG/ML IJ SOLN
10.0000 mg | Freq: Once | INTRAMUSCULAR | Status: AC
  Administered 2020-12-20: 10 mg via INTRAMUSCULAR

## 2020-12-20 NOTE — ED Provider Notes (Signed)
[facility]    CSN: [id number] Arrival date & time: 12/20/20  1048      History   Chief Complaint No chief complaint on file.   HPI [name] is a 50 y.o. female.   HPI Patient with a history of low back pain presents for evaluation of left sided back. Radiating into her left hip x several months. [facility] is followed [facility] and is scheduled for a follow-up on 01/12/2021.  She denies any injury. Last seen here at urgent care for same problem in May 2022, treated with decadron and toradol injection and burst of steroids with muscle relaxer.Current episode of pain present for the last 2-3 days. Past Medical History:  Diagnosis Date   Anxiety    Arthritis    Balance problems    Chronic abdominal pain    Chronic knee pain    Constipation    Difficulty walking    GERD (gastroesophageal reflux disease)    Headache    HTN (hypertension)    Low back pain    Panic attacks    Polyuria    Seizures ([place])    unknown etiology; on dilantin; last seizures was a few years ago.   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    Derangement of posterior horn of medial meniscus of right knee    Primary osteoarthritis of right knee    Gastric polyp    Gastroesophageal reflux disease 08/24/2015   Hypoactive sexual desire disorder 05/14/2014   Abdominal pain, chronic, epigastric 12/08/2013   Constipation 12/08/2013   Esophageal dysphagia 02/22/2013   Abdominal pain, epigastric 02/22/2013   LUQ pain 02/22/2013   Difficulty in walking(719.7) 01/25/2012   Stiffness of joint, not elsewhere classified, ankle and foot 01/25/2012   Balance problems 01/25/2012   Ankle sprain 01/17/2012   Ankle pain, right 01/17/2012    Past Surgical History:  Procedure  Laterality Date   BACK SURGERY     complicated by ureteral injury   CHONDROPLASTY Right 08/31/2016   Procedure: CHONDROPLASTY RIGHT FEMUR;  Surgeon: Carole Civil, MD;  Location: [facility];  Service: Orthopedics;  Laterality: Right;   COLONOSCOPY WITH PROPOFOL N/A 12/19/2019   Procedure: COLONOSCOPY WITH PROPOFOL;  Surgeon: Daneil Dolin, Nonbleeding external and internal grade 3 hemorrhoids, three 2-47m polyps, otherwise normal.  Suspected benign anorectal bleeding.  Pathology revealed hyperplastic polyps.  Repeat colonoscopy in 10 years.   ESOPHAGOGASTRODUODENOSCOPY N/A 09/11/2015   Procedure: ESOPHAGOGASTRODUODENOSCOPY (EGD);  Surgeon: RDaneil Dolin MD;  Normal esophagus s/p dilation, few gastric polyps resected and retrieved, normal duodenum. Gastric polyps benign.    ESOPHAGOGASTRODUODENOSCOPY (EGD) WITH ESOPHAGEAL DILATION N/A 03/20/2013   Dr. Rourk:Normal EGD-status post passage of a Maloney dilator/Status post esophageal biopsy., benign path   ESOPHAGOGASTRODUODENOSCOPY (EGD) WITH PROPOFOL N/A 12/19/2019   Procedure: ESOPHAGOGASTRODUODENOSCOPY (EGD) WITH PROPOFOL;  Surgeon: RDaneil Dolin MD;  Normal esophagus s/p dilation, benign-appearing fundic gland polyps, normal examined duodenum.   KNEE ARTHROSCOPY WITH MEDIAL MENISECTOMY Right 08/31/2016   Procedure: KNEE ARTHROSCOPY WITH MEDIAL MENISECTOMY;  Surgeon: HCarole Civil MD;  Location: [facility];  Service: Orthopedics;  Laterality: Right;   KNEE ARTHROSCOPY WITH MEDIAL MENISECTOMY Right 09/13/2018   Procedure: KNEE ARTHROSCOPY WITH MEDIAL MENISCECTOMY AND MICROFRACTURE;  Surgeon: Carole Civil, MD;  Location: [facility];  Service: Orthopedics;  Laterality: Right;   MALONEY DILATION N/A 09/11/2015   Procedure: Venia Minks DILATION;  Surgeon: Daneil Dolin, MD;  [facility];  Service: Endoscopy;  Laterality: N/A;   MALONEY DILATION N/A 12/19/2019   Procedure: Venia Minks DILATION;  Surgeon: Daneil Dolin, MD;  Location: [facility];  Service: Endoscopy;  Laterality: N/A;   POLYPECTOMY  12/19/2019   Procedure: POLYPECTOMY;  Surgeon: Daneil Dolin, MD;  [facility];  Service: Endoscopy;;    OB History     Gravida  1   Para      Term      Preterm      AB  1   Living         SAB  1   IAB      Ectopic      Multiple      Live Births               Home Medications    Prior to Admission medications   Medication Sig Start Date End Date Taking? Authorizing Provider  predniSONE (DELTASONE) 20 MG tablet Take 2 tablets (40 mg total) by mouth daily with breakfast. 12/20/20  Yes Scot Jun, FNP  tiZANidine (ZANAFLEX) 4 MG tablet Take 1 tablet (4 mg total) by mouth every 8 (eight) hours as needed for muscle spasms. 12/20/20  Yes Scot Jun, FNP  ALPRAZolam Duanne Moron) 0.5 MG tablet Take 0.5 mg by mouth 2 (two) times daily as needed for anxiety.  04/30/14   [provider]  amLODipine (NORVASC) 10 MG tablet Take 10 mg by mouth daily at 2 PM.     [provider]  atorvastatin (LIPITOR) 40 MG tablet Take 1 tablet by mouth daily.    [provider]  DULoxetine (CYMBALTA) 30 MG capsule Take 30 mg by mouth daily. 08/21/19   [provider]  estradiol (ESTRACE) 0.5 MG tablet TAKE 1 TABLET BY MOUTH DAILY 02/20/20   Florian Buff, MD  ezetimibe (ZETIA) 10 MG tablet Take 10 mg by mouth daily. 02/20/20   [provider]  fluticasone (FLONASE) 50 MCG/ACT nasal spray Place 2 sprays into both nostrils daily. 11/11/19   [provider]  irbesartan (AVAPRO) 300 MG tablet Take 300 mg by mouth daily at 2 PM.  07/03/14   [provider]  LINZESS 145 MCG CAPS capsule TAKE 1 CAPSULE BY MOUTH EVERY DAY 12/17/20   Mahala Menghini, PA-C  metoprolol succinate (TOPROL-XL) 25 MG 24 hr tablet Take 1 tablet by mouth daily. 09/08/20   [provider]  Omega-3 Fatty Acids (FISH OIL) 1000 MG CAPS Take 2,000 mg by mouth at bedtime.      [provider]  ondansetron (ZOFRAN) 4 MG tablet Take 1 tablet (4 mg total) by mouth every 6 (six) hours. Patient taking differently: Take 4 mg by mouth as needed for nausea or vomiting. 11/10/19   Wurst, [place], PA-C  oxyCODONE-acetaminophen (PERCOCET/ROXICET) 5-325 MG tablet Take 1 tablet by mouth every 4 (four) hours as needed for severe pain (pain.).     [provider]  pantoprazole (PROTONIX) 40 MG tablet Take 1 tablet (40 mg total) by mouth 2 (two) times daily. 03/19/20   Erenest Rasher, PA-C  phenytoin (DILANTIN) 100 MG ER capsule Take 200 mg by mouth in the morning and at bedtime.     [provider]  Probiotic  Product (PROBIOTIC-10 PO) Take 1 capsule by mouth daily.    [provider]  progesterone (PROMETRIUM) 100 MG capsule Take 1 capsule (100 mg total) by mouth See admin instructions. Take 1 capsule (100 mg) by mouth daily x 14 days every 3 months 09/03/20   Florian Buff, MD  rosuvastatin (CRESTOR) 5 MG tablet Take 5 mg by mouth daily. 01/23/20   [provider]  tiZANidine (ZANAFLEX) 4 MG tablet Take 1 tablet (4 mg total) by mouth every 6 (six) hours as needed for muscle spasms. 08/31/20   Scot Jun, FNP  Vibegron (GEMTESA) 75 MG TABS Take 1 tablet by mouth daily.    [provider]    Family History Family History  Problem Relation Age of Onset   Throat cancer Father    Heart disease Father    Cancer Father    Hypertension Father    Diabetes Father    Other Mother        esophageal stricture and gallbladder disease   Thyroid disease Mother    Other Sister        esophageal stricture and gallbladder problems.    Diabetes Sister    Hypertension Sister    Thyroid disease Sister    Heart disease Maternal Grandfather    Diabetes Maternal Grandfather    Arthritis Maternal Grandfather    Colon cancer Neg Hx    Liver disease Neg Hx    Pancreatic cancer Neg Hx     Social History Social History   Tobacco  Use   Smoking status: Former    Packs/day: 2.00    Years: 9.00    Pack years: 18.00    Types: Cigarettes    Quit date: 04/04/1994    Years since quitting: 26.7   Smokeless tobacco: Never   Tobacco comments:    smoked 8 years, quit 18 years ago in the 1996  Vaping Use   Vaping Use: Never used  Substance Use Topics   Alcohol use: No    Alcohol/week: 0.0 standard drinks   Drug use: No     Allergies   Benadryl [diphenhydramine hcl]   Review of Systems Review of Systems Pertinent negatives listed in HPI   Physical Exam Triage Vital Signs ED Triage Vitals  Enc Vitals Group     BP 12/20/20 1124 133/87     Pulse Rate 12/20/20 1124 (!) 101     Resp 12/20/20 1124 16     Temp 12/20/20 1124 98.5 F (36.9 C)     Temp Source 12/20/20 1124 Oral     SpO2 12/20/20 1124 95 %     Weight --      Height --      Head Circumference --      Peak Flow --      Pain Score 12/20/20 1125 10     Pain Loc --      Pain Edu? --      Excl. in [place]? --    No data found.  Updated Vital Signs BP 133/87 (BP Location: Right Arm)   Pulse (!) 101   Temp 98.5 F (36.9 C) (Oral)   Resp 16   LMP 07/06/2018   SpO2 95%   Visual Acuity Right Eye Distance:   Left Eye Distance:   Bilateral Distance:    Right Eye Near:   Left Eye Near:    Bilateral Near:     Physical Exam General appearance: Patient is alert, cooperative, without distress Head:  Normocephalic, without obvious abnormality, atraumatic Respiratory: Respirations even and unlabored, normal respiratory rate Heart: rate and rhythm normal. No gallop or murmurs noted on exam  Lumbosacral spine area reveals no local tenderness or mass. Painful and reduced LS ROM noted. SL  raise is + left  Extremities: No gross deformities Skin: Skin color, texture, turgor normal. No rashes seen  Psych: Appropriate mood and affect. Neurologic: GCS 15, normal coordination antalgic gait   UC Treatments / Results  Labs (all labs ordered are listed, but  only abnormal results are displayed) Labs Reviewed - No data to display  EKG   Radiology No results found.  Procedures Procedures (including critical care time)  Medications Ordered in UC Medications  dexamethasone (DECADRON) injection 10 mg (has no administration in time range)  ketorolac (TORADOL) 30 MG/ML injection 30 mg (has no administration in time range)    Initial Impression / Assessment and Plan / UC Course  I have reviewed the triage vital signs and the nursing notes.  Pertinent labs & imaging results that were available during my care of the patient were reviewed by me and considered in my medical decision making (see chart for details).    Acute left sided sciatica Decadron and Toradol Im given here in clinic  Continue home treatment today with prednisone and tizanidine Final Clinical Impressions(s) / UC Diagnoses   Final diagnoses:  Acute left-sided low back pain with left-sided sciatica   Discharge Instructions   None    ED Prescriptions     Medication Sig Dispense Auth. Provider   predniSONE (DELTASONE) 20 MG tablet Take 2 tablets (40 mg total) by mouth daily with breakfast. 10 tablet Scot Jun, FNP   tiZANidine (ZANAFLEX) 4 MG tablet Take 1 tablet (4 mg total) by mouth every 8 (eight) hours as needed for muscle spasms. 60 tablet Scot Jun, FNP      PDMP not reviewed this encounter.   Scot Jun, FNP 12/20/20 1225

## 2020-12-20 NOTE — ED Triage Notes (Signed)
Left lower back pain that radiates down left leg x a few months.  Has appointment with ortho on 10/11

## 2020-12-21 ENCOUNTER — Telehealth (HOSPITAL_COMMUNITY): Payer: Self-pay | Admitting: Physical Therapy

## 2020-12-21 ENCOUNTER — Other Ambulatory Visit: Payer: Self-pay | Admitting: Family Medicine

## 2020-12-21 ENCOUNTER — Encounter (HOSPITAL_COMMUNITY): Payer: Medicare Other | Admitting: Physical Therapy

## 2020-12-21 DIAGNOSIS — M5442 Lumbago with sciatica, left side: Secondary | ICD-10-CM

## 2020-12-21 NOTE — Telephone Encounter (Signed)
Called patient about missed appointment. Patient states she went to urgent care yesterday and is still in a lot of pain. Gave reminder about upcoming appointment Wednesday.    4:41 PM, 12/21/20 Josue Hector PT DPT  Physical Therapist with [facility]  [phone]

## 2020-12-23 ENCOUNTER — Encounter (HOSPITAL_COMMUNITY): Payer: Medicare Other

## 2020-12-23 ENCOUNTER — Telehealth (HOSPITAL_COMMUNITY): Payer: Self-pay

## 2020-12-23 NOTE — Telephone Encounter (Signed)
Pt is in so much pain she can hardly walk - she wanted to cx and r/s. Patient will call her MD and return next week to PT.

## 2020-12-31 ENCOUNTER — Ambulatory Visit (HOSPITAL_COMMUNITY): Payer: Medicare Other | Admitting: Physical Therapy

## 2021-01-01 DIAGNOSIS — E1165 Type 2 diabetes mellitus with hyperglycemia: Secondary | ICD-10-CM | POA: Diagnosis not present

## 2021-01-01 DIAGNOSIS — I10 Essential (primary) hypertension: Secondary | ICD-10-CM | POA: Diagnosis not present

## 2021-01-12 DIAGNOSIS — M5416 Radiculopathy, lumbar region: Secondary | ICD-10-CM | POA: Diagnosis not present

## 2021-01-12 DIAGNOSIS — M545 Low back pain, unspecified: Secondary | ICD-10-CM | POA: Diagnosis not present

## 2021-01-15 DIAGNOSIS — H02831 Dermatochalasis of right upper eyelid: Secondary | ICD-10-CM | POA: Diagnosis not present

## 2021-01-15 DIAGNOSIS — H2513 Age-related nuclear cataract, bilateral: Secondary | ICD-10-CM | POA: Diagnosis not present

## 2021-01-15 DIAGNOSIS — H02834 Dermatochalasis of left upper eyelid: Secondary | ICD-10-CM | POA: Diagnosis not present

## 2021-01-19 DIAGNOSIS — H02054 Trichiasis without entropian left upper eyelid: Secondary | ICD-10-CM | POA: Diagnosis not present

## 2021-01-19 DIAGNOSIS — H02055 Trichiasis without entropian left lower eyelid: Secondary | ICD-10-CM | POA: Diagnosis not present

## 2021-01-19 DIAGNOSIS — H02834 Dermatochalasis of left upper eyelid: Secondary | ICD-10-CM | POA: Diagnosis not present

## 2021-01-19 DIAGNOSIS — H02051 Trichiasis without entropian right upper eyelid: Secondary | ICD-10-CM | POA: Diagnosis not present

## 2021-01-19 DIAGNOSIS — H02831 Dermatochalasis of right upper eyelid: Secondary | ICD-10-CM | POA: Diagnosis not present

## 2021-01-19 DIAGNOSIS — Z01818 Encounter for other preprocedural examination: Secondary | ICD-10-CM | POA: Diagnosis not present

## 2021-01-26 DIAGNOSIS — M5417 Radiculopathy, lumbosacral region: Secondary | ICD-10-CM | POA: Diagnosis not present

## 2021-01-26 DIAGNOSIS — M5126 Other intervertebral disc displacement, lumbar region: Secondary | ICD-10-CM | POA: Diagnosis not present

## 2021-01-26 DIAGNOSIS — I10 Essential (primary) hypertension: Secondary | ICD-10-CM | POA: Diagnosis not present

## 2021-01-29 ENCOUNTER — Other Ambulatory Visit: Payer: Self-pay | Admitting: Neurological Surgery

## 2021-02-06 ENCOUNTER — Other Ambulatory Visit: Payer: Self-pay | Admitting: Obstetrics & Gynecology

## 2021-02-08 NOTE — Pre-Procedure Instructions (Signed)
Surgical Instructions    Your procedure is scheduled on Friday, November 11th.  Report to [facility] Main Entrance "A" at 11:30 A.M., then check in with the Admitting office.  Call this number if you have problems the morning of surgery:  [phone]   If you have any questions prior to your surgery date call [phone]: Open Monday-Friday 8am-4pm    Remember:  Do not eat after midnight the night before your surgery  You may drink clear liquids until 10:30 a.m. the morning of your surgery.   Clear liquids allowed are: Water, Non-Citrus Juices (without pulp), Carbonated Beverages, Clear Tea, Black Coffee Only, and Gatorade    Take these medicines the morning of surgery with A SIP OF WATER  atorvastatin (LIPITOR) DULoxetine (CYMBALTA) ezetimibe (ZETIA) LINZESS  metoprolol succinate (TOPROL-XL) pantoprazole (PROTONIX)  phenytoin (DILANTIN) tiZANidine (ZANAFLEX) Vibegron (GEMTESA)   As needed ALPRAZolam (XANAX)  fluticasone (FLONASE) ondansetron (ZOFRAN)  oxyCODONE-acetaminophen (PERCOCET/ROXICET)  As of today, STOP taking any Aspirin (unless otherwise instructed by your surgeon), diclofenac (VOLTAREN), Aleve, Naproxen, Ibuprofen, Motrin, Advil, Goody's, BC's, all herbal medications, fish oil, and all vitamins.                     Do NOT Smoke (Tobacco/Vaping) or drink Alcohol 24 hours prior to your procedure.  If you use a CPAP at night, you may bring all equipment for your overnight stay.   Contacts, glasses, piercing's, hearing aid's, dentures or partials may not be worn into surgery, please bring cases for these belongings.    For patients admitted to the hospital, discharge time will be determined by your treatment team.   Patients discharged the day of surgery will not be allowed to drive home, and someone needs to stay with them for 24 hours.  NO VISITORS WILL BE ALLOWED IN PRE-OP WHERE PATIENTS GET READY FOR SURGERY.  ONLY 1 SUPPORT PERSON MAY BE PRESENT IN THE  WAITING ROOM WHILE YOU ARE IN SURGERY.  IF YOU ARE TO BE ADMITTED, ONCE YOU ARE IN YOUR ROOM YOU WILL BE ALLOWED TWO (2) VISITORS.  Minor children may have two parents present. Special consideration for safety and communication needs will be reviewed on a case by case basis.   Special instructions:   [facility]- Preparing For Surgery  Before surgery, you can play an important role. Because skin is not sterile, your skin needs to be as free of germs as possible. You can reduce the number of germs on your skin by washing with CHG (chlorahexidine gluconate) Soap before surgery.  CHG is an antiseptic cleaner which kills germs and bonds with the skin to continue killing germs even after washing.    Oral Hygiene is also important to reduce your risk of infection.  Remember - BRUSH YOUR TEETH THE MORNING OF SURGERY WITH YOUR REGULAR TOOTHPASTE  Please do not use if you have an allergy to CHG or antibacterial soaps. If your skin becomes reddened/irritated stop using the CHG.  Do not shave (including legs and underarms) for at least 48 hours prior to first CHG shower. It is OK to shave your face.  Please follow these instructions carefully.   Shower the NIGHT BEFORE SURGERY and the MORNING OF SURGERY  If you chose to wash your hair, wash your hair first as usual with your normal shampoo.  After you shampoo, rinse your hair and body thoroughly to remove the shampoo.  Use CHG Soap as you would any other liquid soap. You can apply  CHG directly to the skin and wash gently with a scrungie or a clean washcloth.   Apply the CHG Soap to your body ONLY FROM THE NECK DOWN.  Do not use on open wounds or open sores. Avoid contact with your eyes, ears, mouth and genitals (private parts). Wash Face and genitals (private parts)  with your normal soap.   Wash thoroughly, paying special attention to the area where your surgery will be performed.  Thoroughly rinse your body with warm water from the neck down.  DO  NOT shower/wash with your normal soap after using and rinsing off the CHG Soap.  Pat yourself dry with a CLEAN TOWEL.  Wear CLEAN PAJAMAS to bed the night before surgery  Place CLEAN SHEETS on your bed the night before your surgery  DO NOT SLEEP WITH PETS.   Day of Surgery: Shower with CHG soap. Do not wear jewelry, make up, nail polish, gel polish, artificial nails, or any other type of covering on natural nails including finger and toenails. If patients have artificial nails, gel coating, etc. that need to be removed by a nail salon please have this removed prior to surgery. Surgery may need to be canceled/delayed if the surgeon/ anesthesia feels like the patient is unable to be adequately monitored. Do not wear lotions, powders, perfumes, or deodorant. Do not shave 48 hours prior to surgery.   Do not bring valuables to the hospital. [facility] is not responsible for any belongings or valuables. Wear Clean/Comfortable clothing the morning of surgery Remember to brush your teeth WITH YOUR REGULAR TOOTHPASTE.   Please read over the following fact sheets that you were given.   3 days prior to your procedure or After your COVID test   You are not required to quarantine however you are required to wear a well-fitting mask when you are out and around people not in your household. If your mask becomes wet or soiled, replace with a new one.   Wash your hands often with soap and water for 20 seconds or clean your hands with an alcohol-based hand sanitizer that contains at least 60% alcohol.   Do not share personal items.   Notify your provider:  o if you are in close contact with someone who has COVID  o or if you develop a fever of 100.4 or greater, sneezing, cough, sore throat, shortness of breath or body aches.

## 2021-02-09 ENCOUNTER — Encounter (HOSPITAL_COMMUNITY)
Admission: RE | Admit: 2021-02-09 | Discharge: 2021-02-09 | Disposition: A | Discharge status: Home / Self Care | Payer: Medicare Other | Source: Ambulatory Visit | Attending: Neurological Surgery | Admitting: Neurological Surgery

## 2021-02-09 ENCOUNTER — Other Ambulatory Visit: Payer: Self-pay

## 2021-02-09 ENCOUNTER — Encounter (HOSPITAL_COMMUNITY): Payer: Self-pay

## 2021-02-09 VITALS — BP 123/96 | HR 92 | Temp 98.8°F | Resp 17 | Ht 66.0 in | Wt 178.8 lb

## 2021-02-09 DIAGNOSIS — Z01818 Encounter for other preprocedural examination: Secondary | ICD-10-CM | POA: Insufficient documentation

## 2021-02-09 DIAGNOSIS — I10 Essential (primary) hypertension: Secondary | ICD-10-CM | POA: Insufficient documentation

## 2021-02-09 LAB — BASIC METABOLIC PANEL
Anion gap: 11 (ref 5–15)
BUN: 10 mg/dL (ref 6–20)
CO2: 28 mmol/L (ref 22–32)
Calcium: 9.9 mg/dL (ref 8.9–10.3)
Chloride: 98 mmol/L (ref 98–111)
Creatinine, Ser: 0.76 mg/dL (ref 0.44–1.00)
GFR, Estimated: >60 mL/min (ref >60)
Glucose, Bld: 89 mg/dL (ref 70–99)
Potassium: 4.8 mmol/L (ref 3.5–5.1)
Sodium: 137 mmol/L (ref 135–145)

## 2021-02-09 LAB — CBC
HCT: 42.3 % (ref 36.0–46.0)
Hemoglobin: 14.6 g/dL (ref 12.0–15.0)
MCH: 32 pg (ref 26.0–34.0)
MCHC: 34.5 g/dL (ref 30.0–36.0)
MCV: 92.8 fL (ref 80.0–100.0)
Platelets: 353 10*3/uL (ref 150–400)
RBC: 4.56 MIL/uL (ref 3.87–5.11)
RDW: 12.5 % (ref 11.5–15.5)
WBC: 6.5 10*3/uL (ref 4.0–10.5)
nRBC: 0 % (ref 0.0–0.2)

## 2021-02-09 LAB — PROTIME-INR
INR: 1 (ref 0.8–1.2)
Prothrombin Time: 12.8 seconds (ref 11.4–15.2)

## 2021-02-09 LAB — SURGICAL PCR SCREEN
MRSA, PCR: NEGATIVE
Staphylococcus aureus: NEGATIVE

## 2021-02-09 NOTE — Progress Notes (Signed)
PCP - Dr. Allyn Kenner Cardiologist - denies  PPM/ICD - n/a  Chest x-ray - n/a EKG - 02/09/21 Stress Test - denies ECHO - denies Cardiac Cath - denies  Sleep Study - OSA+ CPAP - denies use  Blood Thinner Instructions: n/a Aspirin Instructions: n/a  ERAS Protcol -Clear liquids until 1030 DOS. PRE-SURGERY Ensure or G2- none ordered.   COVID TEST- Pt tested positive for covid-19 on 11/26/20. This is within 90 day time frame of the "no-retest" protocol. This diagnosis is noted in CE under last office note from pt's PCP, Dr. Allyn Kenner.   Anesthesia review: Yes, abnormal EKG.  Patient denies shortness of breath, fever, cough and chest pain at PAT appointment   All instructions explained to the patient, with a verbal understanding of the material. Patient agrees to go over the instructions while at home for a better understanding. Patient also instructed to self quarantine after being tested for COVID-19. The opportunity to ask questions was provided.

## 2021-02-12 ENCOUNTER — Encounter (HOSPITAL_COMMUNITY)
Admission: RE | Disposition: A | Discharge status: Home / Self Care | Payer: Self-pay | Source: Home / Self Care | Attending: Neurological Surgery

## 2021-02-12 ENCOUNTER — Ambulatory Visit (HOSPITAL_COMMUNITY): Payer: Medicare Other

## 2021-02-12 ENCOUNTER — Ambulatory Visit (HOSPITAL_COMMUNITY)
Admission: RE | Admit: 2021-02-12 | Discharge: 2021-02-12 | Disposition: A | Discharge status: Home / Self Care | Payer: Medicare Other | Attending: Neurological Surgery | Admitting: Neurological Surgery

## 2021-02-12 ENCOUNTER — Ambulatory Visit (HOSPITAL_COMMUNITY): Payer: Medicare Other | Admitting: Physician Assistant

## 2021-02-12 ENCOUNTER — Ambulatory Visit (HOSPITAL_COMMUNITY): Payer: Medicare Other | Admitting: Anesthesiology

## 2021-02-12 ENCOUNTER — Encounter (HOSPITAL_COMMUNITY): Payer: Self-pay | Admitting: Neurological Surgery

## 2021-02-12 DIAGNOSIS — I10 Essential (primary) hypertension: Secondary | ICD-10-CM | POA: Insufficient documentation

## 2021-02-12 DIAGNOSIS — M4807 Spinal stenosis, lumbosacral region: Secondary | ICD-10-CM | POA: Diagnosis not present

## 2021-02-12 DIAGNOSIS — E785 Hyperlipidemia, unspecified: Secondary | ICD-10-CM | POA: Diagnosis not present

## 2021-02-12 DIAGNOSIS — K219 Gastro-esophageal reflux disease without esophagitis: Secondary | ICD-10-CM | POA: Insufficient documentation

## 2021-02-12 DIAGNOSIS — G4730 Sleep apnea, unspecified: Secondary | ICD-10-CM | POA: Diagnosis not present

## 2021-02-12 DIAGNOSIS — F1120 Opioid dependence, uncomplicated: Secondary | ICD-10-CM | POA: Diagnosis not present

## 2021-02-12 DIAGNOSIS — Z87891 Personal history of nicotine dependence: Secondary | ICD-10-CM | POA: Diagnosis not present

## 2021-02-12 DIAGNOSIS — R569 Unspecified convulsions: Secondary | ICD-10-CM | POA: Diagnosis not present

## 2021-02-12 DIAGNOSIS — M5117 Intervertebral disc disorders with radiculopathy, lumbosacral region: Secondary | ICD-10-CM | POA: Insufficient documentation

## 2021-02-12 DIAGNOSIS — Z79899 Other long term (current) drug therapy: Secondary | ICD-10-CM | POA: Insufficient documentation

## 2021-02-12 DIAGNOSIS — F419 Anxiety disorder, unspecified: Secondary | ICD-10-CM | POA: Diagnosis not present

## 2021-02-12 DIAGNOSIS — Z419 Encounter for procedure for purposes other than remedying health state, unspecified: Secondary | ICD-10-CM

## 2021-02-12 DIAGNOSIS — M1990 Unspecified osteoarthritis, unspecified site: Secondary | ICD-10-CM | POA: Diagnosis not present

## 2021-02-12 DIAGNOSIS — M5127 Other intervertebral disc displacement, lumbosacral region: Secondary | ICD-10-CM | POA: Diagnosis not present

## 2021-02-12 DIAGNOSIS — Z9889 Other specified postprocedural states: Secondary | ICD-10-CM | POA: Diagnosis not present

## 2021-02-12 HISTORY — PX: LUMBAR LAMINECTOMY/DECOMPRESSION MICRODISCECTOMY: SHX5026

## 2021-02-12 SURGERY — LUMBAR LAMINECTOMY/DECOMPRESSION MICRODISCECTOMY 1 LEVEL
Anesthesia: General | Site: Back | Laterality: Left

## 2021-02-12 MED ORDER — ACETAMINOPHEN 325 MG PO TABS: 650.0000 mg | ORAL_TABLET | ORAL | Status: DC | PRN

## 2021-02-12 MED ORDER — SENNA 8.6 MG PO TABS: 1.0000 | ORAL_TABLET | Freq: Two times a day (BID) | ORAL | Status: DC

## 2021-02-12 MED ORDER — PROPOFOL 10 MG/ML IV BOLUS
INTRAVENOUS | Status: AC
  Filled 2021-02-12: qty 20

## 2021-02-12 MED ORDER — ACETAMINOPHEN 500 MG PO TABS: 1000.0000 mg | ORAL_TABLET | ORAL | Status: AC

## 2021-02-12 MED ORDER — ONDANSETRON HCL 4 MG/2ML IJ SOLN: 4.0000 mg | Freq: Four times a day (QID) | INTRAMUSCULAR | Status: DC | PRN

## 2021-02-12 MED ORDER — MORPHINE SULFATE (PF) 2 MG/ML IV SOLN: 2.0000 mg | INTRAVENOUS | Status: DC | PRN

## 2021-02-12 MED ORDER — ONDANSETRON HCL 4 MG PO TABS: 4.0000 mg | ORAL_TABLET | Freq: Four times a day (QID) | ORAL | Status: DC | PRN

## 2021-02-12 MED ORDER — PROMETHAZINE HCL 25 MG/ML IJ SOLN
6.2500 mg | INTRAMUSCULAR | Status: DC | PRN
Start: 2021-02-12 — End: 2021-02-12

## 2021-02-12 MED ORDER — SODIUM CHLORIDE 0.9 % IV SOLN: 250.0000 mL | INTRAVENOUS | Status: DC

## 2021-02-12 MED ORDER — CHLORHEXIDINE GLUCONATE 0.12 % MT SOLN: 15.0000 mL | Freq: Once | OROMUCOSAL | Status: AC

## 2021-02-12 MED ORDER — OXYCODONE HCL 5 MG/5ML PO SOLN: 5.0000 mg | Freq: Once | ORAL | Status: DC | PRN

## 2021-02-12 MED ORDER — KETOROLAC TROMETHAMINE 30 MG/ML IJ SOLN
30.0000 mg | Freq: Once | INTRAMUSCULAR | Status: AC | PRN
  Administered 2021-02-12: 30 mg via INTRAVENOUS

## 2021-02-12 MED ORDER — CEFAZOLIN SODIUM-DEXTROSE 2-4 GM/100ML-% IV SOLN
INTRAVENOUS | Status: AC
  Filled 2021-02-12: qty 100

## 2021-02-12 MED ORDER — GABAPENTIN 300 MG PO CAPS: 300.0000 mg | ORAL_CAPSULE | ORAL | Status: AC

## 2021-02-12 MED ORDER — DULOXETINE HCL 30 MG PO CPEP: 30.0000 mg | ORAL_CAPSULE | Freq: Every day | ORAL | Status: DC

## 2021-02-12 MED ORDER — ACETAMINOPHEN 500 MG PO TABS
ORAL_TABLET | ORAL | Status: AC
  Administered 2021-02-12: 1000 mg via ORAL
  Filled 2021-02-12: qty 2

## 2021-02-12 MED ORDER — THROMBIN 5000 UNITS EX SOLR
OROMUCOSAL | Status: DC | PRN
  Administered 2021-02-12: 5 mL via TOPICAL

## 2021-02-12 MED ORDER — LACTATED RINGERS IV SOLN: INTRAVENOUS | Status: DC

## 2021-02-12 MED ORDER — ESTRADIOL 1 MG PO TABS: 0.5000 mg | ORAL_TABLET | Freq: Every day | ORAL | Status: DC

## 2021-02-12 MED ORDER — ONDANSETRON HCL 4 MG/2ML IJ SOLN
INTRAMUSCULAR | Status: DC | PRN
  Administered 2021-02-12: 4 mg via INTRAVENOUS

## 2021-02-12 MED ORDER — ACETAMINOPHEN 650 MG RE SUPP: 650.0000 mg | RECTAL | Status: DC | PRN

## 2021-02-12 MED ORDER — THROMBIN 5000 UNITS EX SOLR
CUTANEOUS | Status: AC
  Filled 2021-02-12: qty 5000

## 2021-02-12 MED ORDER — DEXAMETHASONE SODIUM PHOSPHATE 4 MG/ML IJ SOLN: 4.0000 mg | Freq: Four times a day (QID) | INTRAMUSCULAR | Status: DC

## 2021-02-12 MED ORDER — KETOROLAC TROMETHAMINE 30 MG/ML IJ SOLN
INTRAMUSCULAR | Status: DC | PRN
  Administered 2021-02-12: 30 mg via INTRAVENOUS

## 2021-02-12 MED ORDER — MIDAZOLAM HCL 2 MG/2ML IJ SOLN
INTRAMUSCULAR | Status: AC
  Filled 2021-02-12: qty 2

## 2021-02-12 MED ORDER — ORAL CARE MOUTH RINSE: 15.0000 mL | Freq: Once | OROMUCOSAL | Status: AC

## 2021-02-12 MED ORDER — EZETIMIBE 10 MG PO TABS: 10.0000 mg | ORAL_TABLET | Freq: Every day | ORAL | Status: DC

## 2021-02-12 MED ORDER — GABAPENTIN 300 MG PO CAPS
ORAL_CAPSULE | ORAL | Status: AC
  Administered 2021-02-12: 300 mg via ORAL
  Filled 2021-02-12: qty 1

## 2021-02-12 MED ORDER — CHLORHEXIDINE GLUCONATE 0.12 % MT SOLN
OROMUCOSAL | Status: AC
  Administered 2021-02-12: 15 mL via OROMUCOSAL
  Filled 2021-02-12: qty 15

## 2021-02-12 MED ORDER — TIZANIDINE HCL 4 MG PO TABS: 4.0000 mg | ORAL_TABLET | Freq: Three times a day (TID) | ORAL | Status: DC | PRN

## 2021-02-12 MED ORDER — OXYCODONE-ACETAMINOPHEN 5-325 MG PO TABS
1.0000 | ORAL_TABLET | ORAL | 0 refills | Status: AC | PRN
Start: 2021-02-12 — End: ?

## 2021-02-12 MED ORDER — DEXAMETHASONE SODIUM PHOSPHATE 4 MG/ML IJ SOLN
INTRAMUSCULAR | Status: DC | PRN
  Administered 2021-02-12: 4 mg via INTRAVENOUS

## 2021-02-12 MED ORDER — MENTHOL 3 MG MT LOZG: 1.0000 | LOZENGE | OROMUCOSAL | Status: DC | PRN

## 2021-02-12 MED ORDER — ALBUTEROL SULFATE HFA 108 (90 BASE) MCG/ACT IN AERS
INHALATION_SPRAY | RESPIRATORY_TRACT | Status: DC | PRN
  Administered 2021-02-12 (×2): 2 via RESPIRATORY_TRACT

## 2021-02-12 MED ORDER — PHENOL 1.4 % MT LIQD: 1.0000 | OROMUCOSAL | Status: DC | PRN

## 2021-02-12 MED ORDER — VIBEGRON 75 MG PO TABS: 75.0000 mg | ORAL_TABLET | Freq: Every day | ORAL | Status: DC

## 2021-02-12 MED ORDER — CHLORHEXIDINE GLUCONATE CLOTH 2 % EX PADS
6.0000 | MEDICATED_PAD | Freq: Once | CUTANEOUS | Status: AC
  Administered 2021-02-12: 6 via TOPICAL

## 2021-02-12 MED ORDER — ROCURONIUM BROMIDE 100 MG/10ML IV SOLN
INTRAVENOUS | Status: DC | PRN
  Administered 2021-02-12: 20 mg via INTRAVENOUS
  Administered 2021-02-12: 40 mg via INTRAVENOUS

## 2021-02-12 MED ORDER — LIDOCAINE HCL (CARDIAC) PF 100 MG/5ML IV SOSY
PREFILLED_SYRINGE | INTRAVENOUS | Status: DC | PRN
  Administered 2021-02-12: 60 mg via INTRAVENOUS

## 2021-02-12 MED ORDER — POTASSIUM CHLORIDE IN NACL 20-0.9 MEQ/L-% IV SOLN: INTRAVENOUS | Status: DC

## 2021-02-12 MED ORDER — BUPIVACAINE HCL (PF) 0.25 % IJ SOLN
INTRAMUSCULAR | Status: AC
  Filled 2021-02-12: qty 30

## 2021-02-12 MED ORDER — PHENYLEPHRINE HCL (PRESSORS) 10 MG/ML IV SOLN
INTRAVENOUS | Status: DC | PRN
  Administered 2021-02-12: 80 ug via INTRAVENOUS
  Administered 2021-02-12: 120 ug via INTRAVENOUS

## 2021-02-12 MED ORDER — CEFAZOLIN SODIUM-DEXTROSE 2-4 GM/100ML-% IV SOLN
2.0000 g | INTRAVENOUS | Status: AC
  Administered 2021-02-12: 2 g via INTRAVENOUS

## 2021-02-12 MED ORDER — SUGAMMADEX SODIUM 200 MG/2ML IV SOLN
INTRAVENOUS | Status: DC | PRN
  Administered 2021-02-12: 200 mg via INTRAVENOUS

## 2021-02-12 MED ORDER — OXYCODONE HCL 5 MG PO TABS: 5.0000 mg | ORAL_TABLET | Freq: Once | ORAL | Status: DC | PRN

## 2021-02-12 MED ORDER — LINACLOTIDE 145 MCG PO CAPS: 145.0000 ug | ORAL_CAPSULE | Freq: Every day | ORAL | Status: DC

## 2021-02-12 MED ORDER — CEFAZOLIN SODIUM-DEXTROSE 2-4 GM/100ML-% IV SOLN: 2.0000 g | Freq: Three times a day (TID) | INTRAVENOUS | Status: DC

## 2021-02-12 MED ORDER — DEXAMETHASONE 4 MG PO TABS: 4.0000 mg | ORAL_TABLET | Freq: Four times a day (QID) | ORAL | Status: DC

## 2021-02-12 MED ORDER — KETOROLAC TROMETHAMINE 30 MG/ML IJ SOLN
INTRAMUSCULAR | Status: AC
  Filled 2021-02-12: qty 1

## 2021-02-12 MED ORDER — 0.9 % SODIUM CHLORIDE (POUR BTL) OPTIME
TOPICAL | Status: DC | PRN
  Administered 2021-02-12: 1000 mL

## 2021-02-12 MED ORDER — BUPIVACAINE HCL (PF) 0.25 % IJ SOLN
INTRAMUSCULAR | Status: DC | PRN
  Administered 2021-02-12: 4 mL

## 2021-02-12 MED ORDER — MIDAZOLAM HCL 5 MG/5ML IJ SOLN
INTRAMUSCULAR | Status: DC | PRN
  Administered 2021-02-12: 2 mg via INTRAVENOUS

## 2021-02-12 MED ORDER — AMISULPRIDE (ANTIEMETIC) 5 MG/2ML IV SOLN: 10.0000 mg | Freq: Once | INTRAVENOUS | Status: DC | PRN

## 2021-02-12 MED ORDER — PANTOPRAZOLE SODIUM 40 MG PO TBEC: 40.0000 mg | DELAYED_RELEASE_TABLET | Freq: Two times a day (BID) | ORAL | Status: DC

## 2021-02-12 MED ORDER — FENTANYL CITRATE (PF) 250 MCG/5ML IJ SOLN
INTRAMUSCULAR | Status: AC
  Filled 2021-02-12: qty 5

## 2021-02-12 MED ORDER — OXYCODONE-ACETAMINOPHEN 5-325 MG PO TABS: 1.0000 | ORAL_TABLET | ORAL | Status: DC | PRN

## 2021-02-12 MED ORDER — SODIUM CHLORIDE 0.9% FLUSH: 3.0000 mL | Freq: Two times a day (BID) | INTRAVENOUS | Status: DC

## 2021-02-12 MED ORDER — PROPOFOL 10 MG/ML IV BOLUS
INTRAVENOUS | Status: DC | PRN
  Administered 2021-02-12: 200 mg via INTRAVENOUS

## 2021-02-12 MED ORDER — METOPROLOL SUCCINATE ER 25 MG PO TB24: 25.0000 mg | ORAL_TABLET | Freq: Every day | ORAL | Status: DC

## 2021-02-12 MED ORDER — PHENYTOIN SODIUM EXTENDED 100 MG PO CAPS
200.0000 mg | ORAL_CAPSULE | Freq: Every day | ORAL | Status: DC
Start: 2021-02-12 — End: 2021-02-12

## 2021-02-12 MED ORDER — SODIUM CHLORIDE 0.9% FLUSH: 3.0000 mL | INTRAVENOUS | Status: DC | PRN

## 2021-02-12 MED ORDER — FENTANYL CITRATE (PF) 100 MCG/2ML IJ SOLN
INTRAMUSCULAR | Status: DC | PRN
  Administered 2021-02-12: 150 ug via INTRAVENOUS
  Administered 2021-02-12: 50 ug via INTRAVENOUS

## 2021-02-12 MED ORDER — IRBESARTAN 300 MG PO TABS: 300.0000 mg | ORAL_TABLET | Freq: Every day | ORAL | Status: DC

## 2021-02-12 MED ORDER — AMLODIPINE BESYLATE 10 MG PO TABS: 10.0000 mg | ORAL_TABLET | Freq: Every day | ORAL | Status: DC

## 2021-02-12 MED ORDER — FENTANYL CITRATE (PF) 100 MCG/2ML IJ SOLN
INTRAMUSCULAR | Status: AC
  Filled 2021-02-12: qty 2

## 2021-02-12 MED ORDER — FENTANYL CITRATE (PF) 100 MCG/2ML IJ SOLN
25.0000 ug | INTRAMUSCULAR | Status: DC | PRN
  Administered 2021-02-12: 25 ug via INTRAVENOUS

## 2021-02-12 SURGICAL SUPPLY — 38 items
BAG COUNTER SPONGE SURGICOUNT (BAG) ×4 IMPLANT
BAND RUBBER #18 3X1/16 STRL (MISCELLANEOUS) ×4 IMPLANT
BENZOIN TINCTURE PRP APPL 2/3 (GAUZE/BANDAGES/DRESSINGS) ×2 IMPLANT
BUR CARBIDE MATCH 3.0 (BURR) ×2 IMPLANT
CANISTER SUCT 3000ML PPV (MISCELLANEOUS) ×2 IMPLANT
DRAPE LAPAROTOMY 100X72X124 (DRAPES) ×2 IMPLANT
DRAPE MICROSCOPE LEICA (MISCELLANEOUS) ×2 IMPLANT
DRAPE SURG 17X23 STRL (DRAPES) ×2 IMPLANT
DRSG OPSITE POSTOP 3X4 (GAUZE/BANDAGES/DRESSINGS) ×2 IMPLANT
DURAPREP 26ML APPLICATOR (WOUND CARE) ×2 IMPLANT
ELECT REM PT RETURN 9FT ADLT (ELECTROSURGICAL) ×2
ELECTRODE REM PT RTRN 9FT ADLT (ELECTROSURGICAL) ×1 IMPLANT
GAUZE 4X4 16PLY ~~LOC~~+RFID DBL (SPONGE) ×2 IMPLANT
GLOVE SURG ENC MOIS LTX SZ7 (GLOVE) IMPLANT
GLOVE SURG ENC MOIS LTX SZ8 (GLOVE) ×2 IMPLANT
GLOVE SURG UNDER POLY LF SZ7 (GLOVE) IMPLANT
GOWN STRL REUS W/ TWL LRG LVL3 (GOWN DISPOSABLE) IMPLANT
GOWN STRL REUS W/ TWL XL LVL3 (GOWN DISPOSABLE) ×1 IMPLANT
GOWN STRL REUS W/TWL 2XL LVL3 (GOWN DISPOSABLE) IMPLANT
GOWN STRL REUS W/TWL LRG LVL3 (GOWN DISPOSABLE)
GOWN STRL REUS W/TWL XL LVL3 (GOWN DISPOSABLE) ×2
HEMOSTAT POWDER KIT SURGIFOAM (HEMOSTASIS) ×2 IMPLANT
KIT BASIN OR (CUSTOM PROCEDURE TRAY) ×2 IMPLANT
KIT TURNOVER KIT B (KITS) ×2 IMPLANT
NEEDLE HYPO 25X1 1.5 SAFETY (NEEDLE) ×2 IMPLANT
NEEDLE SPNL 20GX3.5 QUINCKE YW (NEEDLE) ×2 IMPLANT
NS IRRIG 1000ML POUR BTL (IV SOLUTION) ×2 IMPLANT
PACK LAMINECTOMY NEURO (CUSTOM PROCEDURE TRAY) ×2 IMPLANT
PAD ARMBOARD 7.5X6 YLW CONV (MISCELLANEOUS) ×6 IMPLANT
SPONGE T-LAP 4X18 ~~LOC~~+RFID (SPONGE) ×2 IMPLANT
STRIP CLOSURE SKIN 1/2X4 (GAUZE/BANDAGES/DRESSINGS) ×2 IMPLANT
SUT VIC AB 0 CT1 18XCR BRD8 (SUTURE) ×1 IMPLANT
SUT VIC AB 0 CT1 8-18 (SUTURE) ×2
SUT VIC AB 2-0 CP2 18 (SUTURE) ×2 IMPLANT
SUT VIC AB 3-0 SH 8-18 (SUTURE) ×2 IMPLANT
TOWEL GREEN STERILE (TOWEL DISPOSABLE) ×2 IMPLANT
TOWEL GREEN STERILE FF (TOWEL DISPOSABLE) ×2 IMPLANT
WATER STERILE IRR 1000ML POUR (IV SOLUTION) ×2 IMPLANT

## 2021-02-12 NOTE — H&P (Signed)
Subjective: Patient is a 50 y.o. female admitted for L leg pain. Onset of symptoms was several months ago, gradually worsening since that time.  The pain is rated severe, and is located at the across the lower back and radiates to LLE. The pain is described as aching and occurs all day. The symptoms have been progressive. Symptoms are exacerbated by coughing, standing, and walking for more than a few minutes. MRI or CT showed stenosis L5-S1   Past Medical History:  Diagnosis Date   Anxiety    Arthritis    Balance problems    Chronic abdominal pain    Chronic knee pain    Constipation    Difficulty walking    GERD (gastroesophageal reflux disease)    Headache    HTN (hypertension)    Low back pain    Panic attacks    Polyuria    Seizures ([place])    unknown etiology; on dilantin; last seizures was a few years ago.   Sleep apnea     Past Surgical History:  Procedure Laterality Date   BACK SURGERY     complicated by ureteral injury   CHONDROPLASTY Right 08/31/2016   Procedure: CHONDROPLASTY RIGHT FEMUR;  Surgeon: Carole Civil, MD;  Location: [facility];  Service: Orthopedics;  Laterality: Right;   COLONOSCOPY WITH PROPOFOL N/A 12/19/2019   Procedure: COLONOSCOPY WITH PROPOFOL;  Surgeon: Daneil Dolin, Nonbleeding external and internal grade 3 hemorrhoids, three 2-28m polyps, otherwise normal.  Suspected benign anorectal bleeding.  Pathology revealed hyperplastic polyps.  Repeat colonoscopy in 10 years.   ESOPHAGOGASTRODUODENOSCOPY N/A 09/11/2015   Procedure: ESOPHAGOGASTRODUODENOSCOPY (EGD);  Surgeon: RDaneil Dolin MD;  Normal esophagus s/p dilation, few gastric polyps resected and retrieved, normal duodenum. Gastric polyps benign.    ESOPHAGOGASTRODUODENOSCOPY (EGD) WITH ESOPHAGEAL DILATION N/A 03/20/2013   Dr. Rourk:Normal EGD-status post passage of a Maloney dilator/Status post esophageal biopsy., benign path   ESOPHAGOGASTRODUODENOSCOPY (EGD) WITH PROPOFOL N/A 12/19/2019    Procedure: ESOPHAGOGASTRODUODENOSCOPY (EGD) WITH PROPOFOL;  Surgeon: RDaneil Dolin MD;  Normal esophagus s/p dilation, benign-appearing fundic gland polyps, normal examined duodenum.   KNEE ARTHROSCOPY WITH MEDIAL MENISECTOMY Right 08/31/2016   Procedure: KNEE ARTHROSCOPY WITH MEDIAL MENISECTOMY;  Surgeon: HCarole Civil MD;  Location: [facility];  Service: Orthopedics;  Laterality: Right;   KNEE ARTHROSCOPY WITH MEDIAL MENISECTOMY Right 09/13/2018   Procedure: KNEE ARTHROSCOPY WITH MEDIAL MENISCECTOMY AND MICROFRACTURE;  Surgeon: HCarole Civil MD;  Location: [facility];  Service: Orthopedics;  Laterality: Right;   MALONEY DILATION N/A 09/11/2015   Procedure: MVenia MinksDILATION;  Surgeon: RDaneil Dolin MD;  Location: [facility];  Service: Endoscopy;  Laterality: N/A;   MALONEY DILATION N/A 12/19/2019   Procedure: MVenia MinksDILATION;  Surgeon: RDaneil Dolin MD;  Location: [facility];  Service: Endoscopy;  Laterality: N/A;   POLYPECTOMY  12/19/2019   Procedure: POLYPECTOMY;  Surgeon: RDaneil Dolin MD;  Location: [facility];  Service: Endoscopy;;    Prior to Admission medications   Medication Sig Start Date End Date Taking? Authorizing Provider  ALPRAZolam (Duanne Moron 0.5 MG tablet Take 0.5 mg by mouth 2 (two) times daily as needed for anxiety.  04/30/14  Yes [provider]  amLODipine (NORVASC) 10 MG tablet Take 10 mg by mouth daily at 2 PM.    Yes [provider]  atorvastatin (LIPITOR) 40 MG tablet Take 40 mg by mouth daily.   Yes [provider]  diclofenac (VOLTAREN) 75 MG EC tablet Take  75 mg by mouth 2 (two) times daily. 10/28/20  Yes [provider]  DULoxetine (CYMBALTA) 30 MG capsule Take 30 mg by mouth daily. 08/21/19  Yes [provider]  estradiol (ESTRACE) 0.5 MG tablet TAKE 1 TABLET BY MOUTH DAILY 02/20/20  Yes Florian Buff, MD  ezetimibe (ZETIA) 10 MG tablet Take 10 mg by mouth daily. 02/20/20  Yes [provider]   irbesartan (AVAPRO) 300 MG tablet Take 300 mg by mouth daily at 2 PM.  07/03/14  Yes [provider]  LINZESS 145 MCG CAPS capsule TAKE 1 CAPSULE BY MOUTH EVERY DAY 12/17/20  Yes Mahala Menghini, PA-C  metoprolol succinate (TOPROL-XL) 25 MG 24 hr tablet Take 25 mg by mouth daily. 09/08/20  Yes [provider]  ondansetron (ZOFRAN) 4 MG tablet Take 1 tablet (4 mg total) by mouth every 6 (six) hours. Patient taking differently: Take 4 mg by mouth every 8 (eight) hours as needed for nausea or vomiting. 11/10/19  Yes Wurst, [place], PA-C  oxyCODONE-acetaminophen (PERCOCET/ROXICET) 5-325 MG tablet Take 1 tablet by mouth every 4 (four) hours as needed for severe pain (pain.).    Yes [provider]  pantoprazole (PROTONIX) 40 MG tablet Take 1 tablet (40 mg total) by mouth 2 (two) times daily. 03/19/20  Yes Erenest Rasher, PA-C  phenytoin (DILANTIN) 100 MG ER capsule Take 200 mg by mouth in the morning and at bedtime.    Yes [provider]  progesterone (PROMETRIUM) 100 MG capsule Take 1 capsule (100 mg total) by mouth See admin instructions. Take 1 capsule (100 mg) by mouth daily x 14 days every 3 months Patient taking differently: Take 100 mg by mouth See admin instructions. Take 1 capsule (100 mg) by mouth daily x 14 days every 3 months cycle 09/03/20  Yes Eure, Mertie Clause, MD  tiZANidine (ZANAFLEX) 4 MG tablet Take 1 tablet (4 mg total) by mouth every 8 (eight) hours as needed for muscle spasms. 12/20/20  Yes Scot Jun, FNP  Vibegron (GEMTESA) 75 MG TABS Take 75 mg by mouth daily.   Yes [provider]  fluticasone (FLONASE) 50 MCG/ACT nasal spray Place 2 sprays into both nostrils daily as needed for allergies. 11/11/19   [provider]  predniSONE (DELTASONE) 20 MG tablet Take 2 tablets (40 mg total) by mouth daily with breakfast. Patient not taking: No sig reported 12/20/20   Scot Jun, FNP  Probiotic Product (PROBIOTIC-10 PO) Take 1  capsule by mouth daily.    [provider]   Allergies  Allergen Reactions   Benadryl [Diphenhydramine Hcl] Rash    Social History   Tobacco Use   Smoking status: Former    Packs/day: 2.00    Years: 9.00    Pack years: 18.00    Types: Cigarettes    Quit date: 04/04/1994    Years since quitting: 26.8   Smokeless tobacco: Never   Tobacco comments:    smoked 8 years, quit 18 years ago in the 1996  Substance Use Topics   Alcohol use: No    Alcohol/week: 0.0 standard drinks    Family History  Problem Relation Age of Onset   Throat cancer Father    Heart disease Father    Cancer Father    Hypertension Father    Diabetes Father    Other Mother        esophageal stricture and gallbladder disease   Thyroid disease Mother    Other Sister  esophageal stricture and gallbladder problems.    Diabetes Sister    Hypertension Sister    Thyroid disease Sister    Heart disease Maternal Grandfather    Diabetes Maternal Grandfather    Arthritis Maternal Grandfather    Colon cancer Neg Hx    Liver disease Neg Hx    Pancreatic cancer Neg Hx      Review of Systems  Positive ROS: neg  All other systems have been reviewed and were otherwise negative with the exception of those mentioned in the HPI and as above.  Objective: Vital signs in last 24 hours: Temp:  [98.2 F (36.8 C)] 98.2 F (36.8 C) (11/11 1140) Pulse Rate:  [100] 100 (11/11 1140) Resp:  [18] 18 (11/11 1140) BP: (161)/(97) 161/97 (11/11 1140) SpO2:  [96 %] 96 % (11/11 1140) Weight:  [80.7 kg] 80.7 kg (11/11 1140)  General Appearance: Alert, cooperative, no distress, appears stated age Head: Normocephalic, without obvious abnormality, atraumatic Eyes: PERRL, conjunctiva/corneas clear, EOM's intact    Neck: Supple, symmetrical, trachea midline Back: Symmetric, no curvature, ROM normal, no CVA tenderness Lungs:  respirations unlabored Heart: Regular rate and rhythm Abdomen: Soft,  non-tender Extremities: Extremities normal, atraumatic, no cyanosis or edema Pulses: 2+ and symmetric all extremities Skin: Skin color, texture, turgor normal, no rashes or lesions  NEUROLOGIC:   Mental status: Alert and oriented x4,  no aphasia, good attention span, fund of knowledge, and memory Motor Exam - grossly normal Sensory Exam - grossly normal Reflexes: 1+ Coordination - grossly normal Gait - grossly normal Balance - grossly normal Cranial Nerves: I: smell Not tested  II: visual acuity  OS: nl    OD: nl  II: visual fields Full to confrontation  II: pupils Equal, round, reactive to light  III,VII: ptosis None  III,IV,VI: extraocular muscles  Full ROM  V: mastication Normal  V: facial light touch sensation  Normal  V,VII: corneal reflex  Present  VII: facial muscle function - upper  Normal  VII: facial muscle function - lower Normal  VIII: hearing Not tested  IX: soft palate elevation  Normal  IX,X: gag reflex Present  XI: trapezius strength  5/5  XI: sternocleidomastoid strength 5/5  XI: neck flexion strength  5/5  XII: tongue strength  Normal    Data Review Lab Results  Component Value Date   WBC 6.5 02/09/2021   HGB 14.6 02/09/2021   HCT 42.3 02/09/2021   MCV 92.8 02/09/2021   PLT 353 02/09/2021   Lab Results  Component Value Date   NA 137 02/09/2021   K 4.8 02/09/2021   CL 98 02/09/2021   CO2 28 02/09/2021   BUN 10 02/09/2021   CREATININE 0.76 02/09/2021   GLUCOSE 89 02/09/2021   Lab Results  Component Value Date   INR 1.0 02/09/2021    Assessment/Plan:  Estimated body mass index is 28.73 kg/m as calculated from the following:   Height as of this encounter: 5' 6"  (1.676 m).   Weight as of this encounter: 80.7 kg. Patient admitted for L L5-S1 decompression. Patient has failed a reasonable attempt at conservative therapy.  I explained the condition and procedure to the patient and answered any questions.  Patient wishes to proceed with  procedure as planned. Understands risks/ benefits and typical outcomes of procedure.   Eustace Moore 02/12/2021 1:34 PM

## 2021-02-12 NOTE — Transfer of Care (Signed)
Immediate Anesthesia Transfer of Care Note  Patient: [name]  Procedure(s) Performed: Left Lumbar Five- Sacral One Microdiscectomy (Left: Back)  Patient Location: PACU  Anesthesia Type:General  Level of Consciousness: drowsy, patient cooperative and responds to stimulation  Airway & Oxygen Therapy: Patient Spontanous Breathing  Post-op Assessment: Report given to RN and Post -op Vital signs reviewed and stable  Post vital signs: Reviewed and stable  Last Vitals:  Vitals Value Taken Time  BP 136/88 02/12/21 1523  Temp 36.6 C 02/12/21 1523  Pulse 100 02/12/21 1526  Resp 13 02/12/21 1526  SpO2 94 % 02/12/21 1526  Vitals shown include unvalidated device data.  Last Pain:  Vitals:   02/12/21 1523  TempSrc:   PainSc: Asleep      Patients Stated Pain Goal: 4 (66/81/59 4707)  Complications: No notable events documented.

## 2021-02-12 NOTE — Anesthesia Procedure Notes (Signed)
Procedure Name: Intubation Date/Time: 02/12/2021 1:57 PM Performed by: Oletta Lamas, CRNA Pre-anesthesia Checklist: Patient identified, Emergency Drugs available, Suction available and Patient being monitored Patient Re-evaluated:Patient Re-evaluated prior to induction Oxygen Delivery Method: Circle System Utilized Preoxygenation: Pre-oxygenation with 100% oxygen Induction Type: IV induction Ventilation: Mask ventilation without difficulty and Oral airway inserted - appropriate to patient size Laryngoscope Size: Mac and 3 Grade View: Grade I Tube type: Oral Tube size: 7.0 mm Number of attempts: 1 Airway Equipment and Method: Stylet and Oral airway Placement Confirmation: ETT inserted through vocal cords under direct vision, positive ETCO2 and breath sounds checked- equal and bilateral Secured at: 21 cm Tube secured with: Tape Dental Injury: Teeth and Oropharynx as per pre-operative assessment  Comments: Intubation by paramedic student 1 attempt

## 2021-02-12 NOTE — Op Note (Signed)
02/12/2021  2:57 PM  PATIENT:  [name]  50 y.o. 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  BLOOD ADMINISTERED: none  DRAINS: None  SPECIMEN:  none  INDICATION FOR PROCEDURE: This patient presented with severe left leg pain in an S1 distribution. Imaging showed a recess stenosis L5-S1 on the left. The patient tried conservative measures without relief. Pain was debilitating. Recommended compressive hemilaminectomy L5-S1 on the left with possible discectomy. Patient understood the risks, benefits, and alternatives and potential outcomes and wished to proceed.  PROCEDURE DETAILS: The patient was taken to the operating room and after induction of adequate generalized endotracheal anesthesia, the patient was rolled into the prone position on the Wilson frame and all pressure points were padded. The lumbar region was cleaned and then prepped with DuraPrep and draped in the usual sterile fashion. 5 cc of local anesthesia was injected and then a dorsal midline incision was made and carried down to the lumbo sacral fascia. The fascia was opened and the paraspinous musculature was taken down in a subperiosteal fashion to expose L5 S1 on the left. Intraoperative x-ray confirmed my level, and then I used a combination of the high-speed drill and the Kerrison punches to perform a hemilaminectomy, medial facetectomy, and foraminotomy at L5-S1 on the left. The underlying yellow ligament was opened and removed in a piecemeal fashion to expose the underlying dura and exiting nerve root. I undercut the lateral recess and dissected  down until I was medial to and distal to the pedicle. The nerve root was well decompressed. We then gently retracted the nerve root medially with a retractor, coagulated the epidural venous vasculature, and found a fairly sizable subannular herniation with a small annular rent with a piece of disc sticking out of that.  Incised the disc space.  We performed a thorough intradiscal discectomy with pituitary rongeurs and curettes, until I had a nice decompression of the nerve root and the midline. I then palpated with a coronary dilator along the nerve root and into the foramen to assure adequate decompression. I felt no more compression of the nerve root. I irrigated with saline solution containing bacitracin. Achieved hemostasis with bipolar cautery, lined the dura with Gelfoam, and then closed the fascia with 0 Vicryl. I closed the subcutaneous tissues with 2-0 Vicryl and the subcuticular tissues with 3-0 Vicryl. The skin was then closed with benzoin and Steri-Strips. The drapes were removed, a sterile dressing was applied.  My nurse practitioner and Dr. Christella Noa were involved in the exposure, safe retraction of the neural elements, the disc work and the closure. the patient was awakened from general anesthesia and transferred to the recovery room in stable condition. At the end of the procedure all sponge, needle and instrument counts were correct.    PLAN OF CARE: Discharge to home after PACU  PATIENT DISPOSITION:  PACU - hemodynamically stable.   Delay start of Pharmacological VTE agent (>24hrs) due to surgical blood loss or risk of bleeding:  yes

## 2021-02-12 NOTE — Anesthesia Preprocedure Evaluation (Signed)
Anesthesia Evaluation  Patient identified by MRN, date of birth, ID band Patient awake    Reviewed: Allergy & Precautions, NPO status , Patient's Chart, lab work & pertinent test results  Airway Mallampati: III  TM Distance: >3 FB Neck ROM: Full    Dental no notable dental hx.    Pulmonary sleep apnea , former smoker,    Pulmonary exam normal breath sounds clear to auscultation       Cardiovascular hypertension, Pt. on medications and Pt. on home beta blockers Normal cardiovascular exam Rhythm:Regular Rate:Normal  ECG: NSR, rate 94   Neuro/Psych  Headaches, Seizures -, Well Controlled,  PSYCHIATRIC DISORDERS Anxiety    GI/Hepatic GERD  Medicated and Controlled,(+)     substance abuse  ,   Endo/Other  negative endocrine ROS  Renal/GU negative Renal ROS     Musculoskeletal  (+) Arthritis , narcotic dependent  Abdominal   Peds  Hematology HLD   Anesthesia Other Findings Radiculopathy, Lumbosacral region  Reproductive/Obstetrics                             Anesthesia Physical Anesthesia Plan  ASA: 3  Anesthesia Plan: General   Post-op Pain Management:    Induction: Intravenous  PONV Risk Score and Plan: 3 and Ondansetron, Dexamethasone, Midazolam and Treatment may vary due to age or medical condition  Airway Management Planned: Oral ETT  Additional Equipment:   Intra-op Plan:   Post-operative Plan: Extubation in OR  Informed Consent: I have reviewed the patients History and Physical, chart, labs and discussed the procedure including the risks, benefits and alternatives for the proposed anesthesia with the patient or authorized representative who has indicated his/her understanding and acceptance.     Dental advisory given  Plan Discussed with: CRNA  Anesthesia Plan Comments:         Anesthesia Quick Evaluation

## 2021-02-13 ENCOUNTER — Encounter (HOSPITAL_COMMUNITY): Payer: Self-pay | Admitting: Neurological Surgery

## 2021-02-13 NOTE — Anesthesia Postprocedure Evaluation (Signed)
Anesthesia Post Note  Patient: [name]  Procedure(s) Performed: Left Lumbar Five- Sacral One Microdiscectomy (Left: Back)     Patient location during evaluation: PACU Anesthesia Type: General Level of consciousness: awake Pain management: pain level controlled Vital Signs Assessment: post-procedure vital signs reviewed and stable Respiratory status: spontaneous breathing, nonlabored ventilation, respiratory function stable and patient connected to nasal cannula oxygen Cardiovascular status: blood pressure returned to baseline and stable Postop Assessment: no apparent nausea or vomiting Anesthetic complications: no   No notable events documented.  Last Vitals:  Vitals:   02/12/21 1623 02/12/21 1638  BP: 131/86 109/88  Pulse: 95 94  Resp: 12 15  Temp:  36.6 C  SpO2: 90% 93%    Last Pain:  Vitals:   02/12/21 1638  TempSrc:   PainSc: 0-No pain                 Floyd Wade P Armanda Forand

## 2021-03-12 DIAGNOSIS — E782 Mixed hyperlipidemia: Secondary | ICD-10-CM | POA: Diagnosis not present

## 2021-03-12 DIAGNOSIS — R7303 Prediabetes: Secondary | ICD-10-CM | POA: Diagnosis not present

## 2021-03-15 ENCOUNTER — Other Ambulatory Visit: Payer: Self-pay | Admitting: Neurological Surgery

## 2021-03-15 DIAGNOSIS — M5417 Radiculopathy, lumbosacral region: Secondary | ICD-10-CM

## 2021-03-17 DIAGNOSIS — I10 Essential (primary) hypertension: Secondary | ICD-10-CM | POA: Diagnosis not present

## 2021-03-17 DIAGNOSIS — K5900 Constipation, unspecified: Secondary | ICD-10-CM | POA: Diagnosis not present

## 2021-03-17 DIAGNOSIS — R7303 Prediabetes: Secondary | ICD-10-CM | POA: Diagnosis not present

## 2021-03-17 DIAGNOSIS — Z0001 Encounter for general adult medical examination with abnormal findings: Secondary | ICD-10-CM | POA: Diagnosis not present

## 2021-03-17 DIAGNOSIS — K219 Gastro-esophageal reflux disease without esophagitis: Secondary | ICD-10-CM | POA: Diagnosis not present

## 2021-03-17 DIAGNOSIS — Z86010 Personal history of colonic polyps: Secondary | ICD-10-CM | POA: Diagnosis not present

## 2021-03-17 DIAGNOSIS — E782 Mixed hyperlipidemia: Secondary | ICD-10-CM | POA: Diagnosis not present

## 2021-03-17 DIAGNOSIS — G8929 Other chronic pain: Secondary | ICD-10-CM | POA: Insufficient documentation

## 2021-03-17 DIAGNOSIS — N3281 Overactive bladder: Secondary | ICD-10-CM | POA: Diagnosis not present

## 2021-03-17 DIAGNOSIS — Z23 Encounter for immunization: Secondary | ICD-10-CM | POA: Diagnosis not present

## 2021-03-23 NOTE — Progress Notes (Signed)
Referring Provider: Celene Squibb, MD Primary Care Physician:  Celene Squibb, MD Primary GI Physician: Dr. Gala Romney  Chief Complaint  Patient presents with   Rectal Bleeding    Bright red    HPI:   [name] is a 50 y.o. female with history of GERD, dysphagia, hemorrhoids, intermittent rectal bleeding, and constipation.  Colonoscopy up-to-date in September 2021, due for repeat in 2031.  EGD also in September 2021 with normal esophagus s/p empiric dilation, benign-appearing fundic gland polyps, normal examined duodenum.  She did not have much improvement in her abdominal pain after dilation.  BPE January 2022 with no esophageal abnormalities, mild age-related dysmotility with incomplete clearance of barium by primary peristaltic waves, barium tablet easily passed into the stomach without obstruction.  She is presenting today with chief complaint of rectal bleeding.   Last seen in our office 09/17/2020.  GERD was well controlled on Protonix 40 mg twice daily.  Dysphagia was improved with lifestyle/dietary adjustments.  Constipation was well controlled on Linzess 145 mcg daily.  Denied BRBPR or melena.  Advised that patient could try reducing Protonix to once daily and monitor for breakthrough symptoms, increase back to twice daily if breakthrough 2-3 times per week.  She was to continue Linzess.  Plan to follow-up in 1 year or sooner if needed.  Today:  Rectal bleeding started last month.  Bright red.  Was just intermittent, now every couple of day. In toilet water and toilet tissue. Will pass gas and pass blood only without having a bowel movement at times. No constipation or straining. Bowels moving daily with Lizness 145 mcg daily. No abdominal pain. No unintentional weight loss, nausea, vomiting, lightheadedness, weakness, shortness of breath, or chest pain.   Taking Advil about once a week, 2 pills per day. No other NSAIDs. Hasn't taken Voltaren in a couple of months. Was taking daily.  Has been on prednisone due to her back, but this has been discontinued.  She had back surgery on 11/11    Reviewed most recent labs completed 12/9 under continuity of care document with primary care.  Hemoglobin within normal limits at 13.8, no other significant abnormalities on CBC or CMP.   Past Medical History:  Diagnosis Date   Anxiety    Arthritis    Balance problems    Chronic abdominal pain    Chronic knee pain    Constipation    Difficulty walking    GERD (gastroesophageal reflux disease)    Headache    HTN (hypertension)    Low back pain    Panic attacks    Polyuria    Seizures ([place])    unknown etiology; on dilantin; last seizures was a few years ago.   Sleep apnea     Past Surgical History:  Procedure Laterality Date   BACK SURGERY     complicated by ureteral injury   CHONDROPLASTY Right 08/31/2016   Procedure: CHONDROPLASTY RIGHT FEMUR;  Surgeon: Carole Civil, MD;  Location: [facility];  Service: Orthopedics;  Laterality: Right;   COLONOSCOPY WITH PROPOFOL N/A 12/19/2019   Procedure: COLONOSCOPY WITH PROPOFOL;  Surgeon: Daneil Dolin, Nonbleeding external and internal grade 3 hemorrhoids, three 2-12m polyps, otherwise normal.  Suspected benign anorectal bleeding.  Pathology revealed hyperplastic polyps.  Repeat colonoscopy in 10 years.   ESOPHAGOGASTRODUODENOSCOPY N/A 09/11/2015   Procedure: ESOPHAGOGASTRODUODENOSCOPY (EGD);  Surgeon: RDaneil Dolin MD;  Normal esophagus s/p dilation, few gastric polyps resected and retrieved, normal duodenum. Gastric polyps  benign.    ESOPHAGOGASTRODUODENOSCOPY (EGD) WITH ESOPHAGEAL DILATION N/A 03/20/2013   Dr. Rourk:Normal EGD-status post passage of a Maloney dilator/Status post esophageal biopsy., benign path   ESOPHAGOGASTRODUODENOSCOPY (EGD) WITH PROPOFOL N/A 12/19/2019   Procedure: ESOPHAGOGASTRODUODENOSCOPY (EGD) WITH PROPOFOL;  Surgeon: Daneil Dolin, MD;  Normal esophagus s/p dilation, benign-appearing fundic gland  polyps, normal examined duodenum.   KNEE ARTHROSCOPY WITH MEDIAL MENISECTOMY Right 08/31/2016   Procedure: KNEE ARTHROSCOPY WITH MEDIAL MENISECTOMY;  Surgeon: Carole Civil, MD;  Location: [facility];  Service: Orthopedics;  Laterality: Right;   KNEE ARTHROSCOPY WITH MEDIAL MENISECTOMY Right 09/13/2018   Procedure: KNEE ARTHROSCOPY WITH MEDIAL MENISCECTOMY AND MICROFRACTURE;  Surgeon: Carole Civil, MD;  Location: [facility];  Service: Orthopedics;  Laterality: Right;   LUMBAR LAMINECTOMY/DECOMPRESSION MICRODISCECTOMY Left 02/12/2021   Procedure: Left Lumbar Five- Sacral One Microdiscectomy;  Surgeon: Eustace Moore, MD;  Location: [facility];  Service: Neurosurgery;  Laterality: Left;  Left Lumbar Five- Sacral One Microdiscectomy   MALONEY DILATION N/A 09/11/2015   Procedure: Venia Minks DILATION;  Surgeon: Daneil Dolin, MD;  [facility];  Service: Endoscopy;  Laterality: N/A;   MALONEY DILATION N/A 12/19/2019   Procedure: Venia Minks DILATION;  Surgeon: Daneil Dolin, MD;  [facility]: [facility];  Service: Endoscopy;  Laterality: N/A;   POLYPECTOMY  12/19/2019   Procedure: POLYPECTOMY;  Surgeon: Daneil Dolin, MD;  [facility]: [facility];  Service: Endoscopy;;    Current Outpatient Medications  Medication Sig Dispense Refill   ALPRAZolam (XANAX) 0.5 MG tablet Take 0.5 mg by mouth 2 (two) times daily as needed for anxiety.   5   amLODipine (NORVASC) 10 MG tablet Take 10 mg by mouth daily at 2 PM.      atorvastatin (LIPITOR) 40 MG tablet Take 40 mg by mouth daily.     DULoxetine (CYMBALTA) 30 MG capsule Take 30 mg by mouth daily.     estradiol (ESTRACE) 0.5 MG tablet TAKE 1 TABLET BY MOUTH DAILY 30 tablet 11   ezetimibe (ZETIA) 10 MG tablet Take 10 mg by mouth daily.     fluticasone (FLONASE) 50 MCG/ACT nasal spray Place 2 sprays into both nostrils daily as needed for allergies.     hydrocortisone (ANUSOL-HC) 2.5 % rectal cream Place 1 application rectally 2 (two) times daily. 30 g 1    irbesartan (AVAPRO) 300 MG tablet Take 300 mg by mouth daily at 2 PM.   12   LINZESS 145 MCG CAPS capsule TAKE 1 CAPSULE BY MOUTH EVERY DAY 30 capsule 5   metoprolol succinate (TOPROL-XL) 25 MG 24 hr tablet Take 25 mg by mouth daily.     ondansetron (ZOFRAN) 4 MG tablet Take 1 tablet (4 mg total) by mouth every 6 (six) hours. (Patient taking differently: Take 4 mg by mouth every 8 (eight) hours as needed for nausea or vomiting.) 12 tablet 0   oxyCODONE-acetaminophen (PERCOCET/ROXICET) 5-325 MG tablet Take 1 tablet by mouth every 4 (four) hours as needed for severe pain (pain.). 30 tablet 0   pantoprazole (PROTONIX) 40 MG tablet Take 1 tablet (40 mg total) by mouth 2 (two) times daily. 180 tablet 3   phenytoin (DILANTIN) 100 MG ER capsule Take 200 mg by mouth in the morning and at bedtime.      progesterone (PROMETRIUM) 100 MG capsule Take 1 capsule (100 mg total) by mouth See admin instructions. Take 1 capsule (100 mg) by mouth daily x 14 days every 3 months (Patient taking differently:  Take 100 mg by mouth See admin instructions. Take 1 capsule (100 mg) by mouth daily x 14 days every 3 months cycle) 14 capsule 6   tiZANidine (ZANAFLEX) 4 MG tablet Take 1 tablet (4 mg total) by mouth every 8 (eight) hours as needed for muscle spasms. 60 tablet 0   Vibegron (GEMTESA) 75 MG TABS Take 75 mg by mouth daily.     No current facility-administered medications for this visit.    Allergies as of 03/24/2021 - Review Complete 03/24/2021  Allergen Reaction Noted   Benadryl [diphenhydramine hcl] Rash 01/17/2012    Family History  Problem Relation Age of Onset   Throat cancer Father    Heart disease Father    Cancer Father    Hypertension Father    Diabetes Father    Other Mother        esophageal stricture and gallbladder disease   Thyroid disease Mother    Other Sister        esophageal stricture and gallbladder problems.    Diabetes Sister    Hypertension Sister    Thyroid disease Sister     Heart disease Maternal Grandfather    Diabetes Maternal Grandfather    Arthritis Maternal Grandfather    Colon cancer Neg Hx    Liver disease Neg Hx    Pancreatic cancer Neg Hx     Social History   Socioeconomic History   Marital status: Single    Spouse name: Not on file   Number of children: 0   Years of education: 12   Highest education level: Not on file  Occupational History   Occupation: disability    Employer: DISABLED  Tobacco Use   Smoking status: Former    Packs/day: 2.00    Years: 9.00    Pack years: 18.00    Types: Cigarettes    Quit date: 04/04/1994    Years since quitting: 26.9   Smokeless tobacco: Never   Tobacco comments:    smoked 8 years, quit 18 years ago in the 1996  Vaping Use   Vaping Use: Never used  Substance and Sexual Activity   Alcohol use: No    Alcohol/week: 0.0 standard drinks   Drug use: No   Sexual activity: Not Currently    Birth control/protection: None  Other Topics Concern   Not on file  Social History Narrative   Not on file   Social Determinants of Health   Financial Resource Strain: Not on file  Food Insecurity: Not on file  Transportation Needs: Not on file  Physical Activity: Not on file  Stress: Not on file  Social Connections: Not on file    Review of Systems: Gen: Denies fever, chills, cold or flulike symptoms, presyncope, syncope. CV: Denies chest pain, palpitations. Resp: Denies dyspnea or cough. GI: See HPI Heme: See HPI  Physical Exam: BP (!) 134/91    Pulse (!) 109    Temp (!) 96.2 F (35.7 C) (Temporal)    Ht 5' 6"  (1.676 m)    Wt 179 lb 12.8 oz (81.6 kg)    LMP 07/06/2018    BMI 29.02 kg/m  General:   Alert and oriented. No distress noted. Pleasant and cooperative.  Head:  Normocephalic and atraumatic. Eyes:  Conjuctiva clear without scleral icterus. Heart:  S1, S2 present without murmurs appreciated. Lungs:  Clear to auscultation bilaterally. No wheezes, rales, or rhonchi. No distress.  Abdomen:   +BS, soft, non-tender and non-distended. No rebound or guarding. No HSM  or masses noted. Rectal: External and internal hemorrhoids, no appreciable fissure or internal masses. No stool or blood on gloved exam finger.  Msk:  Symmetrical without gross deformities. Normal posture. Extremities:  Without edema. Neurologic:  Alert and  oriented x4 Psych: Normal mood and affect.    Assessment:  50 y.o. female with history of GERD, dysphagia without much improvement following empiric dilation in September 2021 and possibly secondary to esophageal dysmotility, hemorrhoids, intermittent rectal bleeding, and constipation presenting today with chief complaint of hematochezia which started about 1 month ago, passing bright red blood into the toilet and on toilet tissue. Increasing in frequency, now every couple of days. She does pass blood at times when only passing gas. Denies rectal pain, abdominal pain, unintentional weight loss, lightheadedness, SOB, weakness. Constipation remains well controlled on Linzess 145 mcg daily without straining or hard stools.  Colonoscopy up-to-date in September 2021, also with hematochezia at that time, which revealed nonbleeding external and internal grade 3 hemorrhoids, 3 hyperplastic polyps removed, no mention of diverticula, recommended repeat in 10 years.  Reviewed recent labs completed with PCP on 12/9, hemoglobin remained within normal limits at 13.8.  No other significant abnormalities on CBC or CMP.  Abdominal exam today was benign.  Rectal exam with external and internal hemorrhoids.  No gross blood, melena, or stool on gloved exam finger.  Suspect intermittent rectal bleeding likely secondary to known hemorrhoids. We will treat her with Anusol cream for now and see how she responds. If ongoing rectal bleeding, consider hemorrhoid banding. I will also go ahead an update a CBC.   Plan:  CBC Anusol rectal cream BID x 7-10 days. Continue Linzess 145 mcg daily.  Limit  toilet time to 2-3 minutes.  Avoid straining.  Discussed hemorrhoid banding. Patient is open to this if needed, but prefer to try the rectal cream first.  Requested progress report in 10 days. Further recommendations at that time regarding follow-up.    Aliene Altes, PA-C [facility] 03/24/2021

## 2021-03-24 ENCOUNTER — Ambulatory Visit (INDEPENDENT_AMBULATORY_CARE_PROVIDER_SITE_OTHER): Payer: Medicare Other | Admitting: Gastroenterology

## 2021-03-24 ENCOUNTER — Other Ambulatory Visit: Payer: Self-pay

## 2021-03-24 ENCOUNTER — Encounter: Payer: Self-pay | Admitting: Gastroenterology

## 2021-03-24 ENCOUNTER — Encounter (HOSPITAL_COMMUNITY): Payer: Self-pay | Admitting: Physical Therapy

## 2021-03-24 VITALS — BP 134/91 | HR 109 | Temp 96.2°F | Ht 66.0 in | Wt 179.8 lb

## 2021-03-24 DIAGNOSIS — K625 Hemorrhage of anus and rectum: Secondary | ICD-10-CM

## 2021-03-24 DIAGNOSIS — K5900 Constipation, unspecified: Secondary | ICD-10-CM

## 2021-03-24 DIAGNOSIS — K649 Unspecified hemorrhoids: Secondary | ICD-10-CM | POA: Insufficient documentation

## 2021-03-24 MED ORDER — HYDROCORTISONE (PERIANAL) 2.5 % EX CREA: 1.0000 "application " | TOPICAL_CREAM | Freq: Two times a day (BID) | CUTANEOUS | 1 refills | Status: DC

## 2021-03-24 NOTE — Therapy (Signed)
[place] °[facility] °[address] °[place], [place], [address] °Phone: [phone]   Fax:  [phone] ° °Patient Details  °Name: [name] °MRN: [id number] °Date of Birth: 09/09/1970 °Referring Provider:  No ref. provider found ° °Encounter Date: 03/24/2021 ° °PHYSICAL THERAPY DISCHARGE SUMMARY ° °Visits from Start of Care: 1 ° °Current functional level related to goals / functional outcomes: °Unknown pt did not return after evaluation  °  °Remaining deficits: °Unknown pt did not return after evaluation  °  °Education / Equipment: °HEP  ° °Patient agrees to discharge. Patient goals were not met. Patient is being discharged due to not returning since the last visit.  ° °Cynthia Russell, PT CLT °[phone]  °03/24/2021, 9:29 AM ° °[place] °[facility] °[address] °, [place], [address] °Phone: [phone]   Fax:  [phone] °

## 2021-03-24 NOTE — Patient Instructions (Signed)
Please have blood work completed at [facility]  Start Anusol rectal cream twice daily per rectum for the next 7-10 days.  Limit toilet time to 2-3 minutes.  Avoid straining.  Continue Linzess 145 mcg daily for constipation.  Please call with a progress report in 10 days.  Further recommendations at that time.  It was good to see you today!  I hope you have a very Merry Christmas and happy new year!  Aliene Altes, PA-C [facility]

## 2021-03-26 ENCOUNTER — Other Ambulatory Visit: Payer: Self-pay

## 2021-03-26 ENCOUNTER — Ambulatory Visit
Admission: RE | Admit: 2021-03-26 | Discharge: 2021-03-26 | Disposition: A | Discharge status: Home / Self Care | Payer: Medicare Other | Source: Ambulatory Visit | Attending: Neurological Surgery | Admitting: Neurological Surgery

## 2021-03-26 DIAGNOSIS — M5417 Radiculopathy, lumbosacral region: Secondary | ICD-10-CM

## 2021-03-26 DIAGNOSIS — M545 Low back pain, unspecified: Secondary | ICD-10-CM | POA: Diagnosis not present

## 2021-03-26 MED ORDER — GADOBENATE DIMEGLUMINE 529 MG/ML IV SOLN
15.0000 mL | Freq: Once | INTRAVENOUS | Status: AC | PRN
  Administered 2021-03-26: 18:00:00 15 mL via INTRAVENOUS

## 2021-04-06 ENCOUNTER — Other Ambulatory Visit: Payer: Medicare Other

## 2021-05-02 DIAGNOSIS — E785 Hyperlipidemia, unspecified: Secondary | ICD-10-CM | POA: Diagnosis not present

## 2021-05-02 DIAGNOSIS — I10 Essential (primary) hypertension: Secondary | ICD-10-CM | POA: Diagnosis not present

## 2021-05-04 DIAGNOSIS — E785 Hyperlipidemia, unspecified: Secondary | ICD-10-CM | POA: Diagnosis not present

## 2021-05-04 DIAGNOSIS — E782 Mixed hyperlipidemia: Secondary | ICD-10-CM | POA: Diagnosis not present

## 2021-05-04 DIAGNOSIS — I10 Essential (primary) hypertension: Secondary | ICD-10-CM | POA: Diagnosis not present

## 2021-05-10 ENCOUNTER — Other Ambulatory Visit: Payer: Self-pay

## 2021-05-10 ENCOUNTER — Ambulatory Visit
Admission: EM | Admit: 2021-05-10 | Discharge: 2021-05-10 | Disposition: A | Discharge status: Home / Self Care | Payer: Medicare Other | Attending: Family Medicine | Admitting: Family Medicine

## 2021-05-10 DIAGNOSIS — S29019A Strain of muscle and tendon of unspecified wall of thorax, initial encounter: Secondary | ICD-10-CM

## 2021-05-10 MED ORDER — CYCLOBENZAPRINE HCL 5 MG PO TABS: 5.0000 mg | ORAL_TABLET | Freq: Three times a day (TID) | ORAL | 0 refills | Status: DC | PRN

## 2021-05-10 MED ORDER — NAPROXEN 500 MG PO TABS: 500.0000 mg | ORAL_TABLET | Freq: Two times a day (BID) | ORAL | 0 refills | Status: DC | PRN

## 2021-05-10 NOTE — ED Triage Notes (Signed)
Pt reports right sided lower back pian x 4 days. Tylenol gives no relief.

## 2021-05-10 NOTE — ED Provider Notes (Signed)
[facility]    CSN: [id number] Arrival date & time: 05/10/21  1433      History   Chief Complaint Chief Complaint  Patient presents with   Back Pain   HPI [name] is a 51 y.o. female.   Presenting today with 4-day history of right lateral mid back pain.  She states the pain is dull, achy and worse with movement.  She denies any known injury to the area, redness, rash, shortness of breath, chest pain, abdominal pain, nausea vomiting or diarrhea.  Has been trying Tylenol with only mild short-term relief.  Past Medical History:  Diagnosis Date   Anxiety    Arthritis    Balance problems    Chronic abdominal pain    Chronic knee pain    Constipation    Difficulty walking    GERD (gastroesophageal reflux disease)    Headache    HTN (hypertension)    Low back pain    Panic attacks    Polyuria    Seizures ([place])    unknown etiology; on dilantin; last seizures was a few years ago.   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    Derangement of posterior horn of medial meniscus of right knee    Primary osteoarthritis of right knee    Gastric polyp    Gastroesophageal reflux disease 08/24/2015   Hypoactive sexual desire disorder 05/14/2014   Abdominal pain, chronic, epigastric 12/08/2013   Constipation 12/08/2013   Esophageal dysphagia 02/22/2013   Abdominal pain, epigastric 02/22/2013   LUQ pain 02/22/2013   Difficulty in walking(719.7) 01/25/2012   Stiffness of joint, not elsewhere classified, ankle and foot 01/25/2012   Balance problems 01/25/2012   Ankle sprain 01/17/2012   Ankle pain, right 01/17/2012    Past Surgical History:  Procedure Laterality Date   BACK SURGERY     complicated by ureteral injury    CHONDROPLASTY Right 08/31/2016   Procedure: CHONDROPLASTY RIGHT FEMUR;  Surgeon: Carole Civil, MD;  Location: [facility];  Service: Orthopedics;  Laterality: Right;   COLONOSCOPY WITH PROPOFOL N/A 12/19/2019   Procedure: COLONOSCOPY WITH PROPOFOL;  Surgeon: Daneil Dolin, Nonbleeding external and internal grade 3 hemorrhoids, three 2-8m polyps, otherwise normal.  Suspected benign anorectal bleeding.  Pathology revealed hyperplastic polyps.  Repeat colonoscopy in 10 years.   ESOPHAGOGASTRODUODENOSCOPY N/A 09/11/2015   Procedure: ESOPHAGOGASTRODUODENOSCOPY (EGD);  Surgeon: RDaneil Dolin MD;  Normal esophagus s/p dilation, few gastric polyps resected and retrieved, normal duodenum. Gastric polyps benign.    ESOPHAGOGASTRODUODENOSCOPY (EGD) WITH ESOPHAGEAL DILATION N/A 03/20/2013   Dr. Rourk:Normal EGD-status post passage of a Maloney dilator/Status post esophageal biopsy., benign path   ESOPHAGOGASTRODUODENOSCOPY (EGD) WITH PROPOFOL N/A 12/19/2019   Procedure: ESOPHAGOGASTRODUODENOSCOPY (EGD) WITH PROPOFOL;  Surgeon: RDaneil Dolin MD;  Normal esophagus s/p dilation, benign-appearing fundic gland polyps, normal examined duodenum.   KNEE ARTHROSCOPY WITH MEDIAL MENISECTOMY Right 08/31/2016   Procedure: KNEE ARTHROSCOPY WITH MEDIAL MENISECTOMY;  Surgeon: HCarole Civil MD;  Location: [facility];  Service: Orthopedics;  Laterality: Right;   KNEE ARTHROSCOPY WITH MEDIAL MENISECTOMY Right 09/13/2018   Procedure: KNEE ARTHROSCOPY WITH MEDIAL MENISCECTOMY AND MICROFRACTURE;  Surgeon: HCarole Civil MD;  Location: [facility];  Service:  Orthopedics;  Laterality: Right;   LUMBAR LAMINECTOMY/DECOMPRESSION MICRODISCECTOMY Left 02/12/2021   Procedure: Left Lumbar Five- Sacral One Microdiscectomy;  Surgeon: Eustace Moore, MD;  Location: [facility];  Service: Neurosurgery;  Laterality: Left;  Left Lumbar Five- Sacral One Microdiscectomy   MALONEY DILATION N/A 09/11/2015   Procedure: Venia Minks DILATION;  Surgeon:  Daneil Dolin, MD;  [facility];  Service: Endoscopy;  Laterality: N/A;   MALONEY DILATION N/A 12/19/2019   Procedure: Venia Minks DILATION;  Surgeon: Daneil Dolin, MD;  [facility];  Service: Endoscopy;  Laterality: N/A;   POLYPECTOMY  12/19/2019   Procedure: POLYPECTOMY;  Surgeon: Daneil Dolin, MD;  [facility];  Service: Endoscopy;;    OB History     Gravida  1   Para      Term      Preterm      AB  1   Living         SAB  1   IAB      Ectopic      Multiple      Live Births               Home Medications    Prior to Admission medications   Medication Sig Start Date End Date Taking? Authorizing Provider  cyclobenzaprine (FLEXERIL) 5 MG tablet Take 1 tablet (5 mg total) by mouth 3 (three) times daily as needed for muscle spasms. Do not drink alcohol or drive while taking this medication.  May cause drowsiness. 05/10/21  Yes Volney American, PA-C  naproxen (NAPROSYN) 500 MG tablet Take 1 tablet (500 mg total) by mouth 2 (two) times daily as needed. 05/10/21  Yes Volney American, PA-C  ALPRAZolam Duanne Moron) 0.5 MG tablet Take 0.5 mg by mouth 2 (two) times daily as needed for anxiety.  04/30/14   [provider]  amLODipine (NORVASC) 10 MG tablet Take 10 mg by mouth daily at 2 PM.     [provider]  atorvastatin (LIPITOR) 40 MG tablet Take 40 mg by mouth daily.    [provider]  DULoxetine (CYMBALTA) 30 MG capsule Take 30 mg by mouth daily. 08/21/19   [provider]  estradiol (ESTRACE) 0.5 MG tablet TAKE 1 TABLET BY MOUTH DAILY 02/23/21   Florian Buff, MD  ezetimibe (ZETIA) 10 MG tablet Take 10 mg by mouth daily. 02/20/20   [provider]  fluticasone (FLONASE) 50 MCG/ACT nasal spray Place 2 sprays into both nostrils daily as needed for allergies. 11/11/19   [provider]  hydrocortisone (ANUSOL-HC) 2.5 % rectal cream Place 1 application rectally 2 (two) times  daily. 03/24/21   Erenest Rasher, PA-C  irbesartan (AVAPRO) 300 MG tablet Take 300 mg by mouth daily at 2 PM.  07/03/14   [provider]  LINZESS 145 MCG CAPS capsule TAKE 1 CAPSULE BY MOUTH EVERY DAY 12/17/20   Mahala Menghini, PA-C  metoprolol succinate (TOPROL-XL) 25 MG 24 hr tablet Take 25 mg by mouth daily. 09/08/20   [provider]  ondansetron (ZOFRAN) 4 MG tablet Take 1 tablet (4 mg total) by mouth every 6 (six) hours. Patient taking differently: Take 4 mg by mouth every 8 (eight) hours as needed for nausea or vomiting. 11/10/19   Wurst, [place], PA-C  oxyCODONE-acetaminophen (PERCOCET/ROXICET) 5-325 MG tablet Take 1 tablet by mouth every 4 (four) hours as needed for severe pain (pain.). 02/12/21   Eustace Moore, MD  pantoprazole (PROTONIX) 40 MG tablet Take 1 tablet (40 mg total) by mouth 2 (two) times daily. 03/19/20   Erenest Rasher, PA-C  phenytoin (DILANTIN) 100 MG ER capsule Take 200 mg by mouth in the morning and at bedtime.     [provider]  progesterone (PROMETRIUM) 100 MG capsule Take 1 capsule (100 mg total) by mouth See admin instructions. Take 1 capsule (100 mg) by mouth daily x 14 days every 3 months Patient taking differently: Take 100 mg by mouth See admin instructions. Take 1 capsule (100 mg) by mouth daily x 14 days every 3 months cycle 09/03/20   Florian Buff, MD  tiZANidine (ZANAFLEX) 4 MG tablet Take 1 tablet (4 mg total) by mouth every 8 (eight) hours as needed for muscle spasms. 12/20/20   Scot Jun, FNP  Vibegron (GEMTESA) 75 MG TABS Take 75 mg by mouth daily.    [provider]    Family History Family History  Problem Relation Age of Onset   Throat cancer Father    Heart disease Father    Cancer Father    Hypertension Father    Diabetes Father    Other Mother        esophageal stricture and gallbladder disease   Thyroid disease Mother    Other Sister        esophageal stricture and gallbladder problems.     Diabetes Sister    Hypertension Sister    Thyroid disease Sister    Heart disease Maternal Grandfather    Diabetes Maternal Grandfather    Arthritis Maternal Grandfather    Colon cancer Neg Hx    Liver disease Neg Hx    Pancreatic cancer Neg Hx     Social History Social History   Tobacco Use   Smoking status: Former    Packs/day: 2.00    Years: 9.00    Pack years: 18.00    Types: Cigarettes    Quit date: 04/04/1994    Years since quitting: 27.1   Smokeless tobacco: Never   Tobacco comments:    smoked 8 years, quit 18 years ago in the 1996  Vaping Use   Vaping Use: Never used  Substance Use Topics   Alcohol use: No    Alcohol/week: 0.0 standard drinks   Drug use: No     Allergies   Benadryl [diphenhydramine hcl]   Review of Systems Review of Systems Per HPI  Physical Exam Triage Vital Signs ED Triage Vitals  Enc Vitals Group     BP 05/10/21 1641 112/75     Pulse Rate 05/10/21 1641 91     Resp 05/10/21 1641 16     Temp 05/10/21 1641 98.2 F (36.8 C)     Temp Source 05/10/21 1641 Oral     SpO2 05/10/21 1641 96 %     Weight --      Height --      Head Circumference --      Peak Flow --      Pain Score 05/10/21 1638 8     Pain Loc --      Pain Edu? --      Excl. in [place]? --    No data found.  Updated Vital Signs BP 112/75 (BP Location: Right Arm)    Pulse 91    Temp 98.2 F (36.8 C) (Oral)    Resp 16    LMP 07/06/2018    SpO2 96%   Visual Acuity Right Eye  Distance:   Left Eye Distance:   Bilateral Distance:    Right Eye Near:   Left Eye Near:    Bilateral Near:     Physical Exam Vitals and nursing note reviewed.  Constitutional:      Appearance: Normal appearance. She is not ill-appearing.  HENT:     Head: Atraumatic.     Mouth/Throat:     Mouth: Mucous membranes are moist.  Eyes:     Extraocular Movements: Extraocular movements intact.     Conjunctiva/sclera: Conjunctivae normal.  Cardiovascular:     Rate and Rhythm: Normal rate and  regular rhythm.     Heart sounds: Normal heart sounds.  Pulmonary:     Effort: Pulmonary effort is normal. No respiratory distress.     Breath sounds: Normal breath sounds. No wheezing or rales.  Musculoskeletal:        General: Tenderness present. No swelling, deformity or signs of injury. Normal range of motion.     Cervical back: Normal range of motion and neck supple.     Comments: Mild localized tenderness to palpation right lateral thoracic musculature No midline spinal tenderness to palpation diffusely.  Negative straight leg raise bilaterally  Skin:    General: Skin is warm and dry.     Findings: No bruising or erythema.  Neurological:     Mental Status: She is alert and oriented to person, place, and time.     Motor: No weakness.     Gait: Gait normal.  Psychiatric:        Mood and Affect: Mood normal.        Thought Content: Thought content normal.        Judgment: Judgment normal.     UC Treatments / Results  Labs (all labs ordered are listed, but only abnormal results are displayed) Labs Reviewed - No data to display  EKG   Radiology No results found.  Procedures Procedures (including critical care time)  Medications Ordered in UC Medications - No data to display  Initial Impression / Assessment and Plan / UC Course  I have reviewed the triage vital signs and the nursing notes.  Pertinent labs & imaging results that were available during my care of the patient were reviewed by me and considered in my medical decision making (see chart for details).     Suspect thoracic strain, will treat with Flexeril, naproxen, heat, stretches, rest.  Return for acutely worsening symptoms.  Final Clinical Impressions(s) / UC Diagnoses   Final diagnoses:  Thoracic myofascial strain, initial encounter   Discharge Instructions   None    ED Prescriptions     Medication Sig Dispense Auth. Provider   cyclobenzaprine (FLEXERIL) 5 MG tablet Take 1 tablet (5 mg total)  by mouth 3 (three) times daily as needed for muscle spasms. Do not drink alcohol or drive while taking this medication.  May cause drowsiness. 15 tablet Volney American, [place]   naproxen (NAPROSYN) 500 MG tablet Take 1 tablet (500 mg total) by mouth 2 (two) times daily as needed. 30 tablet Volney American, [place]      PDMP not reviewed this encounter.   Volney American, [place] 05/10/21 1724

## 2021-05-12 ENCOUNTER — Other Ambulatory Visit: Payer: Self-pay

## 2021-05-16 MED ORDER — PROGESTERONE MICRONIZED 100 MG PO CAPS: 100.0000 mg | ORAL_CAPSULE | ORAL | 6 refills | Status: DC

## 2021-05-29 DIAGNOSIS — I10 Essential (primary) hypertension: Secondary | ICD-10-CM | POA: Diagnosis not present

## 2021-05-29 DIAGNOSIS — E782 Mixed hyperlipidemia: Secondary | ICD-10-CM | POA: Diagnosis not present

## 2021-06-01 DIAGNOSIS — I10 Essential (primary) hypertension: Secondary | ICD-10-CM | POA: Diagnosis not present

## 2021-06-01 DIAGNOSIS — E119 Type 2 diabetes mellitus without complications: Secondary | ICD-10-CM | POA: Diagnosis not present

## 2021-06-01 DIAGNOSIS — E782 Mixed hyperlipidemia: Secondary | ICD-10-CM | POA: Diagnosis not present

## 2021-06-01 DIAGNOSIS — K219 Gastro-esophageal reflux disease without esophagitis: Secondary | ICD-10-CM | POA: Diagnosis not present

## 2021-06-03 DIAGNOSIS — I10 Essential (primary) hypertension: Secondary | ICD-10-CM | POA: Diagnosis not present

## 2021-06-03 DIAGNOSIS — M5417 Radiculopathy, lumbosacral region: Secondary | ICD-10-CM | POA: Diagnosis not present

## 2021-06-16 ENCOUNTER — Other Ambulatory Visit: Payer: Self-pay | Admitting: Gastroenterology

## 2021-06-16 ENCOUNTER — Telehealth: Payer: Self-pay | Admitting: *Deleted

## 2021-06-16 DIAGNOSIS — K5900 Constipation, unspecified: Secondary | ICD-10-CM

## 2021-06-16 MED ORDER — LINACLOTIDE 145 MCG PO CAPS: 145.0000 ug | ORAL_CAPSULE | Freq: Every day | ORAL | 5 refills | Status: DC

## 2021-06-16 NOTE — Telephone Encounter (Signed)
[facility] called stating that pt is transferring pharmacy's and needs a new prescriptions for Linzess 129mg sent to them.  ?

## 2021-06-16 NOTE — Telephone Encounter (Signed)
Noted  

## 2021-06-16 NOTE — Telephone Encounter (Signed)
Prescription sent

## 2021-06-23 ENCOUNTER — Other Ambulatory Visit: Payer: Self-pay | Admitting: Gastroenterology

## 2021-06-23 DIAGNOSIS — K5900 Constipation, unspecified: Secondary | ICD-10-CM

## 2021-06-23 NOTE — Telephone Encounter (Signed)
Rx was sent to [facility] 1 week ago.  ?

## 2021-06-23 NOTE — Telephone Encounter (Signed)
Last office visit 03/24/21 ?

## 2021-06-24 ENCOUNTER — Ambulatory Visit: Payer: Medicare Other | Admitting: Gastroenterology

## 2021-06-28 DIAGNOSIS — E782 Mixed hyperlipidemia: Secondary | ICD-10-CM | POA: Diagnosis not present

## 2021-06-28 DIAGNOSIS — R7303 Prediabetes: Secondary | ICD-10-CM | POA: Diagnosis not present

## 2021-07-01 DIAGNOSIS — E782 Mixed hyperlipidemia: Secondary | ICD-10-CM | POA: Diagnosis not present

## 2021-07-01 DIAGNOSIS — N3281 Overactive bladder: Secondary | ICD-10-CM | POA: Diagnosis not present

## 2021-07-01 DIAGNOSIS — I10 Essential (primary) hypertension: Secondary | ICD-10-CM | POA: Diagnosis not present

## 2021-07-01 DIAGNOSIS — R7303 Prediabetes: Secondary | ICD-10-CM | POA: Diagnosis not present

## 2021-07-01 DIAGNOSIS — G8929 Other chronic pain: Secondary | ICD-10-CM | POA: Diagnosis not present

## 2021-07-02 DIAGNOSIS — I10 Essential (primary) hypertension: Secondary | ICD-10-CM | POA: Diagnosis not present

## 2021-07-02 DIAGNOSIS — E119 Type 2 diabetes mellitus without complications: Secondary | ICD-10-CM | POA: Diagnosis not present

## 2021-07-02 DIAGNOSIS — E785 Hyperlipidemia, unspecified: Secondary | ICD-10-CM | POA: Diagnosis not present

## 2021-08-01 DIAGNOSIS — K219 Gastro-esophageal reflux disease without esophagitis: Secondary | ICD-10-CM | POA: Diagnosis not present

## 2021-08-01 DIAGNOSIS — E782 Mixed hyperlipidemia: Secondary | ICD-10-CM | POA: Diagnosis not present

## 2021-08-01 DIAGNOSIS — I10 Essential (primary) hypertension: Secondary | ICD-10-CM | POA: Diagnosis not present

## 2021-08-02 DIAGNOSIS — L500 Allergic urticaria: Secondary | ICD-10-CM | POA: Diagnosis not present

## 2021-08-20 ENCOUNTER — Ambulatory Visit (INDEPENDENT_AMBULATORY_CARE_PROVIDER_SITE_OTHER): Payer: Medicare Other | Admitting: Adult Health

## 2021-08-20 ENCOUNTER — Other Ambulatory Visit (HOSPITAL_COMMUNITY)
Admission: RE | Admit: 2021-08-20 | Discharge: 2021-08-20 | Disposition: A | Discharge status: Home / Self Care | Payer: Medicare Other | Source: Ambulatory Visit | Attending: Adult Health | Admitting: Adult Health

## 2021-08-20 ENCOUNTER — Encounter: Payer: Self-pay | Admitting: Adult Health

## 2021-08-20 VITALS — BP 91/63 | HR 98 | Ht 66.0 in | Wt 180.0 lb

## 2021-08-20 DIAGNOSIS — Z1151 Encounter for screening for human papillomavirus (HPV): Secondary | ICD-10-CM | POA: Diagnosis not present

## 2021-08-20 DIAGNOSIS — Z01419 Encounter for gynecological examination (general) (routine) without abnormal findings: Secondary | ICD-10-CM | POA: Diagnosis not present

## 2021-08-20 DIAGNOSIS — Z1231 Encounter for screening mammogram for malignant neoplasm of breast: Secondary | ICD-10-CM | POA: Diagnosis not present

## 2021-08-20 DIAGNOSIS — R195 Other fecal abnormalities: Secondary | ICD-10-CM | POA: Insufficient documentation

## 2021-08-20 DIAGNOSIS — K625 Hemorrhage of anus and rectum: Secondary | ICD-10-CM | POA: Diagnosis not present

## 2021-08-20 DIAGNOSIS — Z1211 Encounter for screening for malignant neoplasm of colon: Secondary | ICD-10-CM | POA: Diagnosis not present

## 2021-08-20 DIAGNOSIS — Z79890 Hormone replacement therapy (postmenopausal): Secondary | ICD-10-CM | POA: Insufficient documentation

## 2021-08-20 DIAGNOSIS — K649 Unspecified hemorrhoids: Secondary | ICD-10-CM | POA: Diagnosis not present

## 2021-08-20 LAB — HEMOCCULT GUIAC POC 1CARD (OFFICE): Fecal Occult Blood, POC: POSITIVE — AB

## 2021-08-20 MED ORDER — PROGESTERONE MICRONIZED 100 MG PO CAPS: 100.0000 mg | ORAL_CAPSULE | ORAL | 6 refills | Status: DC

## 2021-08-20 MED ORDER — ESTRADIOL 0.5 MG PO TABS: 0.5000 mg | ORAL_TABLET | Freq: Every day | ORAL | 11 refills | Status: DC

## 2021-08-20 NOTE — Progress Notes (Signed)
Patient ID: [name], female   DOB: 1970-11-29, 51 y.o.   MRN: [id number] History of Present Illness: [name] is a 51 year old white female,single, PM in for well woman gyn exam and pap. She is having bright red rectal bleeding, for about 2 weeks, has some pain in rectum last night. Has hemorrhoids.  PCP is Dr Nevada Crane.   Current Medications, Allergies, Past Medical History, Past Surgical History, Family History and Social History were reviewed in [employer] record.     Review of Systems: Patient denies any headaches, hearing loss, fatigue, blurred vision, shortness of breath, chest pain, abdominal pain, problems with bowel movements, urination, or intercourse.(Not active). No joint pain or mood swings.  See HPI for positives    Physical Exam:BP 91/63 (BP Location: Left Arm, Patient Position: Sitting, Cuff Size: Normal)   Pulse 98   Ht 5' 6"  (1.676 m)   Wt 180 lb (81.6 kg)   LMP 07/06/2018   BMI 29.05 kg/m   General:  Well developed, well nourished, no acute distress Skin:  Warm and dry Neck:  Midline trachea, normal thyroid, good ROM, no lymphadenopathy Lungs; Clear to auscultation bilaterally Breast:  No dominant palpable mass, retraction, or nipple discharge Cardiovascular: Regular rate and rhythm Abdomen:  Soft, non tender, no hepatosplenomegaly Pelvic:  External genitalia is normal in appearance, has skin tag right inner thigh.  The vagina is pale.Urethra has no lesions or masses. The cervix is smooth, pap with HR HPV genotyping performed.  Uterus is felt to be normal size, shape, and contour.  No adnexal masses or tenderness noted.Bladder is non tender, no masses felt. Rectal: Good sphincter tone, no polyps, ++ hemorrhoids felt.  Hemoccult positive. Extremities/musculoskeletal:  No swelling or varicosities noted, no clubbing or cyanosis Psych:  No mood changes, alert and cooperative,seems happy AA is 0 Fall risk is low    08/20/2021   12:22 PM   Depression screen PHQ 2/9  Decreased Interest 0  Down, Depressed, Hopeless 1  PHQ - 2 Score 1  Altered sleeping 0  Tired, decreased energy 1  Change in appetite 1  Feeling bad or failure about yourself  0  Trouble concentrating 0  Moving slowly or fidgety/restless 0  Suicidal thoughts 0  PHQ-9 Score 3       08/20/2021   12:22 PM  GAD 7 : Generalized Anxiety Score  Nervous, Anxious, on Edge 0  Control/stop worrying 1  Worry too much - different things 1  Trouble relaxing 1  Restless 0  Easily annoyed or irritable 0  Afraid - awful might happen 0  Total GAD 7 Score 3      [facility] - 08/20/21 1221       Pregnancy Intention Screening   Does the patient want to become pregnant in the next year? N/A    Does the patient's partner want to become pregnant in the next year? N/A    Would the patient like to discuss contraceptive options today? N/A      Contraception Wrap Up   Current Method No Method - Other Reason   postmenopausal   End Method No Method - Other Reason    Contraception Counseling Provided No            Examination chaperoned by Marcelino Scot RN  Impression and Plan: 1. Encounter for gynecological examination with Papanicolaou smear of cervix Pap sent Physical in 1 year Pap in 3 if normal  2. Encounter for screening fecal occult blood  testing Hemoccult +  See GI 08/26/21.  3. Hemorrhoids, unspecified hemorrhoid type Use anusol HC cream bid daily See GI 08/26/21,they had mentioned banding   4. Rectal bleeding  5. Fecal occult blood test positive See GI 08/26/21  6. Hormone replacement therapy (HRT) Will continue HRT Meds ordered this encounter  Medications   progesterone (PROMETRIUM) 100 MG capsule    Sig: Take 1 capsule (100 mg total) by mouth See admin instructions. Take 1 capsule (100 mg) by mouth daily x 14 days every 3 months    Dispense:  14 capsule    Refill:  6    Order Specific Question:   Supervising Provider    Answer:   Tania Ade H [[id number]]   estradiol (ESTRACE) 0.5 MG tablet    Sig: Take 1 tablet (0.5 mg total) by mouth daily.    Dispense:  30 tablet    Refill:  11    ZERO refills remain on this prescription. Your patient is requesting advance approval of refills for this medication to [facility]    Order Specific Question:   Supervising Provider    Answer:   Elonda Husky, LUTHER H [[id number]]     7. Screening mammogram for breast cancer Order in pt to call and schedule

## 2021-08-23 ENCOUNTER — Ambulatory Visit (HOSPITAL_COMMUNITY)
Admission: RE | Admit: 2021-08-23 | Discharge: 2021-08-23 | Disposition: A | Discharge status: Home / Self Care | Payer: Medicare Other | Source: Ambulatory Visit | Attending: Adult Health | Admitting: Adult Health

## 2021-08-23 DIAGNOSIS — Z1231 Encounter for screening mammogram for malignant neoplasm of breast: Secondary | ICD-10-CM | POA: Insufficient documentation

## 2021-08-24 LAB — CYTOLOGY - PAP
Adequacy: ABSENT
Comment: NEGATIVE
Diagnosis: NEGATIVE
High risk HPV: NEGATIVE

## 2021-08-25 NOTE — Progress Notes (Unsigned)
Referring Provider: Celene Squibb, MD Primary Care Physician:  Celene Squibb, MD Primary GI Physician: Dr. Gala Romney  No chief complaint on file.   HPI:   [name] is a 51 y.o. female  with history of GERD, dysphagia, hemorrhoids, intermittent rectal bleeding, and constipation.  Colonoscopy up-to-date in September 2021, due for repeat in 2031.  EGD also in September 2021 with normal esophagus s/p empiric dilation, benign-appearing fundic gland polyps, normal examined duodenum.  No significant improvement after dilation.  BPE January 2022 with no esophageal abnormalities, mild age-related dysmotility with incomplete clearance of barium by primary peristaltic waves, barium tablet easily passed into the stomach without obstruction.  She is presenting today ***  Last seen in our office in December 2022 with chief complaint of rectal bleeding. Rectal bleeding started the month prior and was intermittent, but was now occurring every couple of days with blood in the toilet water and toilet tissue. Will pass gas and pass blood only without having a bowel movement at times. No constipation or straining. Bowels moving daily with Lizness 145 mcg daily. No other significant symptoms. Recent Hgb was wnl.   Rectal exam with external and internal hemorrhoids.  No gross blood or melena.  Overall, suspected hemorrhoid etiology of rectal bleeding.  Planned to treat with Anusol rectal cream and monitor response.  If ongoing rectal bleeding, consider banding.  Also plan to update CBC.  Labs were not completed.    Past Medical History:  Diagnosis Date   Anxiety    Arthritis    Balance problems    Chronic abdominal pain    Chronic knee pain    Constipation    Difficulty walking    GERD (gastroesophageal reflux disease)    Headache    HTN (hypertension)    Low back pain    Panic attacks    Polyuria    Seizures ([place])    unknown etiology; on dilantin; last seizures was a few years ago.   Sleep  apnea     Past Surgical History:  Procedure Laterality Date   BACK SURGERY     complicated by ureteral injury   CHONDROPLASTY Right 08/31/2016   Procedure: CHONDROPLASTY RIGHT FEMUR;  Surgeon: Carole Civil, MD;  Location: [facility];  Service: Orthopedics;  Laterality: Right;   COLONOSCOPY WITH PROPOFOL N/A 12/19/2019   Procedure: COLONOSCOPY WITH PROPOFOL;  Surgeon: Daneil Dolin, Nonbleeding external and internal grade 3 hemorrhoids, three 2-74m polyps, otherwise normal.  Suspected benign anorectal bleeding.  Pathology revealed hyperplastic polyps.  Repeat colonoscopy in 10 years.   ESOPHAGOGASTRODUODENOSCOPY N/A 09/11/2015   Procedure: ESOPHAGOGASTRODUODENOSCOPY (EGD);  Surgeon: RDaneil Dolin MD;  Normal esophagus s/p dilation, few gastric polyps resected and retrieved, normal duodenum. Gastric polyps benign.    ESOPHAGOGASTRODUODENOSCOPY (EGD) WITH ESOPHAGEAL DILATION N/A 03/20/2013   Dr. Rourk:Normal EGD-status post passage of a Maloney dilator/Status post esophageal biopsy., benign path   ESOPHAGOGASTRODUODENOSCOPY (EGD) WITH PROPOFOL N/A 12/19/2019   Procedure: ESOPHAGOGASTRODUODENOSCOPY (EGD) WITH PROPOFOL;  Surgeon: RDaneil Dolin MD;  Normal esophagus s/p dilation, benign-appearing fundic gland polyps, normal examined duodenum.   KNEE ARTHROSCOPY WITH MEDIAL MENISECTOMY Right 08/31/2016   Procedure: KNEE ARTHROSCOPY WITH MEDIAL MENISECTOMY;  Surgeon: HCarole Civil MD;  Location: [facility];  Service: Orthopedics;  Laterality: Right;   KNEE ARTHROSCOPY WITH MEDIAL MENISECTOMY Right 09/13/2018   Procedure: KNEE ARTHROSCOPY WITH MEDIAL MENISCECTOMY AND MICROFRACTURE;  Surgeon: HCarole Civil MD;  Location: [facility];  Service: Orthopedics;  Laterality: Right;   LUMBAR LAMINECTOMY/DECOMPRESSION MICRODISCECTOMY Left 02/12/2021   Procedure: Left Lumbar Five- Sacral One Microdiscectomy;  Surgeon: Eustace Moore, MD;  Location: [facility];  Service: Neurosurgery;  Laterality: Left;  Left  Lumbar Five- Sacral One Microdiscectomy   MALONEY DILATION N/A 09/11/2015   Procedure: Venia Minks DILATION;  Surgeon: Daneil Dolin, MD;  [facility];  Service: Endoscopy;  Laterality: N/A;   MALONEY DILATION N/A 12/19/2019   Procedure: Venia Minks DILATION;  Surgeon: Daneil Dolin, MD;  [facility];  Service: Endoscopy;  Laterality: N/A;   POLYPECTOMY  12/19/2019   Procedure: POLYPECTOMY;  Surgeon: Daneil Dolin, MD;  [facility];  Service: Endoscopy;;    Current Outpatient Medications  Medication Sig Dispense Refill   ALPRAZolam (XANAX) 0.5 MG tablet Take 0.5 mg by mouth 2 (two) times daily as needed for anxiety.   5   amLODipine (NORVASC) 5 MG tablet SMARTSIG:1 Tablet(s) By Mouth Every Evening     DULoxetine (CYMBALTA) 30 MG capsule Take 30 mg by mouth daily.     estradiol (ESTRACE) 0.5 MG tablet Take 1 tablet (0.5 mg total) by mouth daily. 30 tablet 11   ezetimibe (ZETIA) 10 MG tablet Take 10 mg by mouth daily.     fluticasone (FLONASE) 50 MCG/ACT nasal spray Place 2 sprays into both nostrils daily as needed for allergies.     hydrocortisone (ANUSOL-HC) 2.5 % rectal cream Place 1 application rectally 2 (two) times daily. 30 g 1   irbesartan (AVAPRO) 300 MG tablet Take 300 mg by mouth daily at 2 PM.   12   linaclotide (LINZESS) 145 MCG CAPS capsule Take 1 capsule (145 mcg total) by mouth daily before breakfast. 30 capsule 5   metoprolol succinate (TOPROL-XL) 25 MG 24 hr tablet Take 25 mg by mouth daily.     ondansetron (ZOFRAN) 4 MG tablet Take 1 tablet (4 mg total) by mouth every 6 (six) hours. (Patient taking differently: Take 4 mg by mouth every 8 (eight) hours as needed for nausea or vomiting.) 12 tablet 0   oxyCODONE-acetaminophen (PERCOCET/ROXICET) 5-325 MG tablet Take 1 tablet by mouth every 4 (four) hours as needed for severe pain (pain.). 30 tablet 0   pantoprazole (PROTONIX) 40 MG tablet Take 1 tablet (40 mg total) by mouth 2 (two) times daily. 180  tablet 3   phenytoin (DILANTIN) 100 MG ER capsule Take 200 mg by mouth in the morning and at bedtime.      pregabalin (LYRICA) 50 MG capsule pregabalin 50 mg capsule     progesterone (PROMETRIUM) 100 MG capsule Take 1 capsule (100 mg total) by mouth See admin instructions. Take 1 capsule (100 mg) by mouth daily x 14 days every 3 months 14 capsule 6   Vibegron (GEMTESA) 75 MG TABS Gemtesa 75 mg tablet  Take 1 tablet every day by oral route.     No current facility-administered medications for this visit.    Allergies as of 08/26/2021 - Review Complete 08/20/2021  Allergen Reaction Noted   Benadryl [diphenhydramine hcl] Rash 01/17/2012    Family History  Problem Relation Age of Onset   Throat cancer Father    Heart disease Father    Cancer Father    Hypertension Father    Diabetes Father    Other Mother        esophageal stricture and gallbladder disease   Thyroid disease Mother    Other Sister  esophageal stricture and gallbladder problems.    Diabetes Sister    Hypertension Sister    Thyroid disease Sister    Heart disease Maternal Grandfather    Diabetes Maternal Grandfather    Arthritis Maternal Grandfather    Colon cancer Neg Hx    Liver disease Neg Hx    Pancreatic cancer Neg Hx     Social History   Socioeconomic History   Marital status: Single    Spouse name: Not on file   Number of children: 0   Years of education: 12   Highest education level: Not on file  Occupational History   Occupation: disability    Employer: DISABLED  Tobacco Use   Smoking status: Former    Packs/day: 2.00    Years: 9.00    Pack years: 18.00    Types: Cigarettes    Quit date: 04/04/1994    Years since quitting: 27.4   Smokeless tobacco: Never   Tobacco comments:    smoked 8 years, quit 18 years ago in the 1996  Vaping Use   Vaping Use: Never used  Substance and Sexual Activity   Alcohol use: No    Alcohol/week: 0.0 standard drinks   Drug use: No   Sexual activity:  Not Currently    Birth control/protection: Post-menopausal  Other Topics Concern   Not on file  Social History Narrative   Not on file   Social Determinants of Health   Financial Resource Strain: Low Risk    Difficulty of Paying Living Expenses: Not hard at all  Food Insecurity: No Food Insecurity   Worried About Charity fundraiser in the Last Year: Never true   [place] in the Last Year: Never true  Transportation Needs: No Transportation Needs   Lack of Transportation (Medical): No   Lack of Transportation (Non-Medical): No  Physical Activity: Insufficiently Active   Days of Exercise per Week: 3 days   Minutes of Exercise per Session: 30 min  Stress: No Stress Concern Present   Feeling of Stress : Only a little  Social Connections: Unknown   Frequency of Communication with Friends and Family: More than three times a week   Frequency of Social Gatherings with Friends and Family: More than three times a week   Attends Religious Services: 1 to 4 times per year   Active Member of [employer] or Organizations: No   Attends Archivist Meetings: Never   Marital Status: Patient refused    Review of Systems: Gen: Denies fever, chills, cold or flu like symptoms, pre-syncope, or syncope.   CV: Denies chest pain, palpitations. Resp: Denies dyspnea, cough.  GI: See HPI Heme: See HPI  Physical Exam: LMP 07/06/2018  General:   Alert and oriented. No distress noted. Pleasant and cooperative.  Head:  Normocephalic and atraumatic. Eyes:  Conjuctiva clear without scleral icterus. Heart:  S1, S2 present without murmurs appreciated. Lungs:  Clear to auscultation bilaterally. No wheezes, rales, or rhonchi. No distress.  Abdomen:  +BS, soft, non-tender and non-distended. No rebound or guarding. No HSM or masses noted. Msk:  Symmetrical without gross deformities. Normal posture. Extremities:  Without edema. Neurologic:  Alert and  oriented x4 Psych:  Normal mood and  affect.    Assessment:     Plan:  ***   Aliene Altes, PA-C [facility] 08/26/2021

## 2021-08-26 ENCOUNTER — Ambulatory Visit (INDEPENDENT_AMBULATORY_CARE_PROVIDER_SITE_OTHER): Payer: Medicare Other | Admitting: Gastroenterology

## 2021-08-26 ENCOUNTER — Encounter: Payer: Self-pay | Admitting: Gastroenterology

## 2021-08-26 VITALS — BP 105/64 | HR 106 | Temp 97.5°F | Ht 66.0 in | Wt 181.4 lb

## 2021-08-26 DIAGNOSIS — K219 Gastro-esophageal reflux disease without esophagitis: Secondary | ICD-10-CM

## 2021-08-26 DIAGNOSIS — K625 Hemorrhage of anus and rectum: Secondary | ICD-10-CM | POA: Diagnosis not present

## 2021-08-26 DIAGNOSIS — K649 Unspecified hemorrhoids: Secondary | ICD-10-CM | POA: Diagnosis not present

## 2021-08-26 DIAGNOSIS — K5900 Constipation, unspecified: Secondary | ICD-10-CM

## 2021-08-26 NOTE — Patient Instructions (Addendum)
Have blood work completed at [facility] rectal cream twice daily as needed for rectal bleeding related to hemorrhoids.  We will schedule you for an appointment with Roseanne Kaufman, NP for consideration of hemorrhoid banding.  Continue Linzess 145 mcg daily for constipation.  Limit toilet time to 2-3 minutes.   Avoid straining.   Continue pantoprazole 40 mg twice daily 30 minutes before breakfast and dinner for acid reflux.  Aliene Altes, PA-C [facility]

## 2021-08-27 ENCOUNTER — Other Ambulatory Visit: Payer: Self-pay | Admitting: Gastroenterology

## 2021-08-27 DIAGNOSIS — K625 Hemorrhage of anus and rectum: Secondary | ICD-10-CM | POA: Diagnosis not present

## 2021-08-28 LAB — CBC WITH DIFFERENTIAL/PLATELET
Basophils Absolute: 0 10*3/uL (ref 0.0–0.2)
Basos: 1 %
EOS (ABSOLUTE): 0.1 10*3/uL (ref 0.0–0.4)
Eos: 2 %
Hematocrit: 37.8 % (ref 34.0–46.6)
Hemoglobin: 13.4 g/dL (ref 11.1–15.9)
Immature Grans (Abs): 0 10*3/uL (ref 0.0–0.1)
Immature Granulocytes: 0 %
Lymphocytes Absolute: 2.1 10*3/uL (ref 0.7–3.1)
Lymphs: 32 %
MCH: 31.5 pg (ref 26.6–33.0)
MCHC: 35.4 g/dL (ref 31.5–35.7)
MCV: 89 fL (ref 79–97)
Monocytes Absolute: 0.6 10*3/uL (ref 0.1–0.9)
Monocytes: 9 %
Neutrophils Absolute: 3.7 10*3/uL (ref 1.4–7.0)
Neutrophils: 56 %
Platelets: 313 10*3/uL (ref 150–450)
RBC: 4.26 x10E6/uL (ref 3.77–5.28)
RDW: 13.2 % (ref 11.7–15.4)
WBC: 6.6 10*3/uL (ref 3.4–10.8)

## 2021-08-31 ENCOUNTER — Encounter: Payer: Medicare Other | Admitting: Gastroenterology

## 2021-09-01 DIAGNOSIS — E782 Mixed hyperlipidemia: Secondary | ICD-10-CM | POA: Diagnosis not present

## 2021-09-01 DIAGNOSIS — K219 Gastro-esophageal reflux disease without esophagitis: Secondary | ICD-10-CM | POA: Diagnosis not present

## 2021-09-01 DIAGNOSIS — I10 Essential (primary) hypertension: Secondary | ICD-10-CM | POA: Diagnosis not present

## 2021-09-02 ENCOUNTER — Ambulatory Visit (INDEPENDENT_AMBULATORY_CARE_PROVIDER_SITE_OTHER): Payer: Medicare Other | Admitting: Gastroenterology

## 2021-09-02 ENCOUNTER — Encounter: Payer: Self-pay | Admitting: Gastroenterology

## 2021-09-02 VITALS — BP 118/78 | HR 99 | Temp 97.3°F | Ht 66.0 in | Wt 180.2 lb

## 2021-09-02 DIAGNOSIS — K641 Second degree hemorrhoids: Secondary | ICD-10-CM | POA: Diagnosis not present

## 2021-09-02 NOTE — Patient Instructions (Signed)
Please continue to avoid straining.  You should limit your toilet time to 2-3 minutes at the most.   Continue to avoid constipation.  Please call me with any concerns or issues!  I will see you in follow-up for additional banding in several weeks!  I enjoyed seeing you again today! As you know, I value our relationship and want to provide genuine, compassionate, and quality care. I welcome your feedback. If you receive a survey regarding your visit,  I greatly appreciate you taking time to fill this out. See you next time!  Annitta Needs, PhD, ANP-BC [facility]

## 2021-09-02 NOTE — Progress Notes (Signed)
       [place] BANDING PROCEDURE NOTE  [name] is a 51 y.o. female presenting today for consideration of hemorrhoid banding. Last colonoscopy 2021 with three 2-8 mm polyps (hyperplastic), hemorrhoids. Symptoms including bleeding, pressure, burning, prolapse.    The patient presents with symptomatic grade 2 hemorrhoids, unresponsive to maximal medical therapy, requesting rubber band ligation of her hemorrhoidal disease. All risks, benefits, and alternative forms of therapy were described and informed consent was obtained.  In the left lateral decubitus position, anoscopic examination revealed grade 2 hemorrhoids in the left lateral position predominantly.   The decision was made to band the left lateral internal hemorrhoid, and the [place] was used to perform band ligation without complication. Digital anorectal examination was then performed to assure proper positioning of the band, and to adjust the banded tissue as required. The patient was discharged home without pain or other issues. Dietary and behavioral recommendations were given, along with follow-up instructions. The patient will return in several weeks for followup and possible additional banding as required.  No complications were encountered and the patient tolerated the procedure well.   Annitta Needs, PhD, ANP-BC [facility]

## 2021-09-23 ENCOUNTER — Encounter: Payer: Self-pay | Admitting: Gastroenterology

## 2021-09-23 ENCOUNTER — Ambulatory Visit (INDEPENDENT_AMBULATORY_CARE_PROVIDER_SITE_OTHER): Payer: Medicare Other | Admitting: Gastroenterology

## 2021-09-23 VITALS — BP 112/77 | HR 80 | Temp 97.3°F | Ht 66.0 in | Wt 178.8 lb

## 2021-09-23 DIAGNOSIS — K641 Second degree hemorrhoids: Secondary | ICD-10-CM

## 2021-09-23 NOTE — Progress Notes (Signed)
    [place] BANDING PROCEDURE NOTE  [name] is a 51 y.o. female presenting today for consideration of hemorrhoid banding. Last colonoscopy 2021 with three 2-8 mm polyps (hyperplastic), hemorrhoids. Symptoms including bleeding, pressure, burning, prolapse. She has had banding of the left lateral thus far. Improvement in symptoms thus far.    The patient presents with symptomatic grade 2 hemorrhoids, unresponsive to maximal medical therapy, requesting rubber band ligation of her hemorrhoidal disease. All risks, benefits, and alternative forms of therapy were described and informed consent was obtained.  The decision was made to band the right posterior internal hemorrhoid, and the [place] was used to perform band ligation without complication. Digital anorectal examination was then performed to assure proper positioning of the band, and to adjust the banded tissue as required. The patient was discharged home without pain or other issues. Dietary and behavioral recommendations were given, along with follow-up instructions. The patient will return in several weeks for followup and possible additional banding as required.  No complications were encountered and the patient tolerated the procedure well.   Annitta Needs, PhD, ANP-BC [facility]

## 2021-09-23 NOTE — Patient Instructions (Signed)
Please continue to avoid straining.  You should limit your toilet time to 2-3 minutes at the most.   Continue to avoid constipation.  Please call me with any concerns or issues!  I will see you in follow-up for additional banding in several weeks.    I enjoyed seeing you again today! As you know, I value our relationship and want to provide genuine, compassionate, and quality care. I welcome your feedback. If you receive a survey regarding your visit,  I greatly appreciate you taking time to fill this out. See you next time!  Annitta Needs, PhD, ANP-BC [facility]

## 2021-10-01 DIAGNOSIS — I10 Essential (primary) hypertension: Secondary | ICD-10-CM | POA: Diagnosis not present

## 2021-10-01 DIAGNOSIS — K219 Gastro-esophageal reflux disease without esophagitis: Secondary | ICD-10-CM | POA: Diagnosis not present

## 2021-10-01 DIAGNOSIS — E782 Mixed hyperlipidemia: Secondary | ICD-10-CM | POA: Diagnosis not present

## 2021-10-04 DIAGNOSIS — R569 Unspecified convulsions: Secondary | ICD-10-CM | POA: Diagnosis not present

## 2021-10-07 ENCOUNTER — Encounter: Payer: Medicare Other | Admitting: Gastroenterology

## 2021-10-07 ENCOUNTER — Other Ambulatory Visit (HOSPITAL_COMMUNITY): Payer: Self-pay | Admitting: Family Medicine

## 2021-10-07 ENCOUNTER — Ambulatory Visit (HOSPITAL_COMMUNITY)
Admission: RE | Admit: 2021-10-07 | Discharge: 2021-10-07 | Disposition: A | Discharge status: Home / Self Care | Payer: Medicare Other | Source: Ambulatory Visit | Attending: Family Medicine | Admitting: Family Medicine

## 2021-10-07 ENCOUNTER — Other Ambulatory Visit: Payer: Self-pay | Admitting: Family Medicine

## 2021-10-07 DIAGNOSIS — R000 Tachycardia, unspecified: Secondary | ICD-10-CM | POA: Diagnosis not present

## 2021-10-07 DIAGNOSIS — R079 Chest pain, unspecified: Secondary | ICD-10-CM | POA: Diagnosis not present

## 2021-10-07 DIAGNOSIS — R519 Headache, unspecified: Secondary | ICD-10-CM | POA: Insufficient documentation

## 2021-10-07 DIAGNOSIS — E782 Mixed hyperlipidemia: Secondary | ICD-10-CM | POA: Diagnosis not present

## 2021-10-07 DIAGNOSIS — R7303 Prediabetes: Secondary | ICD-10-CM | POA: Diagnosis not present

## 2021-10-14 ENCOUNTER — Ambulatory Visit (INDEPENDENT_AMBULATORY_CARE_PROVIDER_SITE_OTHER): Payer: Medicare Other | Admitting: Gastroenterology

## 2021-10-14 ENCOUNTER — Encounter: Payer: Self-pay | Admitting: Gastroenterology

## 2021-10-14 VITALS — BP 115/79 | HR 90 | Temp 97.4°F | Ht 66.0 in | Wt 176.6 lb

## 2021-10-14 DIAGNOSIS — K641 Second degree hemorrhoids: Secondary | ICD-10-CM

## 2021-10-14 NOTE — Patient Instructions (Signed)
Please continue to avoid straining.  You should limit your toilet time to 2-3 minutes at the most.   Continue to avoid constipation.  Please call me with any concerns or issues!  We will see you in 1 year for routine follow-up!  I enjoyed seeing you again today! As you know, I value our relationship and want to provide genuine, compassionate, and quality care. I welcome your feedback. If you receive a survey regarding your visit,  I greatly appreciate you taking time to fill this out. See you next time!  Gelene Mink, PhD, ANP-BC [facility]

## 2021-10-14 NOTE — Progress Notes (Signed)
    [facility] BANDING PROCEDURE NOTE  [name] is a 51 y.o. female presenting today for consideration of hemorrhoid banding. Last colonoscopy 2021 with three 2-8 mm polyps (hyperplastic), hemorrhoids. Symptoms included bleeding, pressure, burning, prolapse. She has had banding of the left lateral and right posterior. Noted bleeding this morning.   The patient presents with symptomatic grade 2 hemorrhoids, unresponsive to maximal medical therapy, requesting rubber band ligation of her hemorrhoidal disease. All risks, benefits, and alternative forms of therapy were described and informed consent was obtained.   The decision was made to band the right anterior internal hemorrhoid, and the [facility] was used to perform band ligation without complication. Digital anorectal examination was then performed to assure proper positioning of the band, and to adjust the banded tissue as required. The patient was discharged home without pain or other issues. Dietary and behavioral recommendations were given, along with follow-up instructions. The patient will return as needed. We will see her in 1 year for GERD follow-up.   No complications were encountered and the patient tolerated the procedure well.   Gelene Mink, PhD, ANP-BC [facility]

## 2021-11-01 DIAGNOSIS — I10 Essential (primary) hypertension: Secondary | ICD-10-CM | POA: Diagnosis not present

## 2021-11-01 DIAGNOSIS — E782 Mixed hyperlipidemia: Secondary | ICD-10-CM | POA: Diagnosis not present

## 2021-11-01 DIAGNOSIS — K219 Gastro-esophageal reflux disease without esophagitis: Secondary | ICD-10-CM | POA: Diagnosis not present

## 2021-11-03 ENCOUNTER — Other Ambulatory Visit (HOSPITAL_COMMUNITY): Payer: Self-pay | Admitting: Internal Medicine

## 2021-11-03 ENCOUNTER — Other Ambulatory Visit: Payer: Self-pay | Admitting: Internal Medicine

## 2021-11-03 DIAGNOSIS — N3281 Overactive bladder: Secondary | ICD-10-CM | POA: Diagnosis not present

## 2021-11-03 DIAGNOSIS — E782 Mixed hyperlipidemia: Secondary | ICD-10-CM

## 2021-11-03 DIAGNOSIS — G8929 Other chronic pain: Secondary | ICD-10-CM | POA: Diagnosis not present

## 2021-11-03 DIAGNOSIS — R000 Tachycardia, unspecified: Secondary | ICD-10-CM | POA: Diagnosis not present

## 2021-11-03 DIAGNOSIS — M79644 Pain in right finger(s): Secondary | ICD-10-CM | POA: Diagnosis not present

## 2021-11-03 DIAGNOSIS — R7303 Prediabetes: Secondary | ICD-10-CM | POA: Diagnosis not present

## 2021-11-03 DIAGNOSIS — I10 Essential (primary) hypertension: Secondary | ICD-10-CM | POA: Diagnosis not present

## 2021-11-17 ENCOUNTER — Inpatient Hospital Stay (HOSPITAL_COMMUNITY): Admission: RE | Admit: 2021-11-17 | Payer: Medicare Other | Source: Ambulatory Visit

## 2021-11-17 ENCOUNTER — Encounter (HOSPITAL_COMMUNITY): Payer: Self-pay

## 2021-12-02 DIAGNOSIS — K219 Gastro-esophageal reflux disease without esophagitis: Secondary | ICD-10-CM | POA: Diagnosis not present

## 2021-12-02 DIAGNOSIS — E782 Mixed hyperlipidemia: Secondary | ICD-10-CM | POA: Diagnosis not present

## 2021-12-02 DIAGNOSIS — I10 Essential (primary) hypertension: Secondary | ICD-10-CM | POA: Diagnosis not present

## 2021-12-21 ENCOUNTER — Ambulatory Visit (HOSPITAL_COMMUNITY)
Admission: RE | Admit: 2021-12-21 | Discharge: 2021-12-21 | Disposition: A | Discharge status: Home / Self Care | Payer: Medicare Other | Source: Ambulatory Visit | Attending: Internal Medicine | Admitting: Internal Medicine

## 2021-12-21 DIAGNOSIS — E782 Mixed hyperlipidemia: Secondary | ICD-10-CM | POA: Insufficient documentation

## 2021-12-31 ENCOUNTER — Other Ambulatory Visit: Payer: Self-pay | Admitting: Gastroenterology

## 2021-12-31 DIAGNOSIS — K5900 Constipation, unspecified: Secondary | ICD-10-CM

## 2022-01-12 DIAGNOSIS — H1032 Unspecified acute conjunctivitis, left eye: Secondary | ICD-10-CM | POA: Diagnosis not present

## 2022-01-12 DIAGNOSIS — I10 Essential (primary) hypertension: Secondary | ICD-10-CM | POA: Diagnosis not present

## 2022-02-16 ENCOUNTER — Other Ambulatory Visit (HOSPITAL_COMMUNITY): Payer: Self-pay | Admitting: Family Medicine

## 2022-02-16 DIAGNOSIS — N6320 Unspecified lump in the left breast, unspecified quadrant: Secondary | ICD-10-CM | POA: Insufficient documentation

## 2022-02-16 DIAGNOSIS — Z733 Stress, not elsewhere classified: Secondary | ICD-10-CM | POA: Diagnosis not present

## 2022-02-17 ENCOUNTER — Ambulatory Visit (INDEPENDENT_AMBULATORY_CARE_PROVIDER_SITE_OTHER): Payer: Medicare Other | Admitting: Neurology

## 2022-02-17 ENCOUNTER — Encounter: Payer: Self-pay | Admitting: Neurology

## 2022-02-17 VITALS — BP 136/89 | HR 86 | Ht 66.0 in | Wt 176.0 lb

## 2022-02-17 DIAGNOSIS — G40909 Epilepsy, unspecified, not intractable, without status epilepticus: Secondary | ICD-10-CM | POA: Diagnosis not present

## 2022-02-17 MED ORDER — LAMOTRIGINE 25 MG PO TABS: ORAL_TABLET | ORAL | 0 refills | Status: DC

## 2022-02-17 NOTE — Patient Instructions (Addendum)
Routine EEG, I will contact you to go over the results Continue with Dilantin 200 mg twice daily Start lamotrigine 25 mg daily for 1 week, then increase to 25 mg twice daily for 1 week, then increase to 50 mg twice daily for 1 week, then increase to 75 mg twice daily for 1 week, then increase to 100 mg p.o. Follow-up in 8 weeks, at that time we will obtain a lamotrigine level and start weaning of Dilantin

## 2022-02-17 NOTE — Progress Notes (Signed)
[facility]  PATIENT: [name] DOBJenene Slicker: 06/24/1970  REQUESTING CLINICIAN: Leone PayorFarmer, Eboni, FNP HISTORY FROM: Patient and girlfriend  REASON FOR VISIT: Establish care for Epilepsy    HISTORICAL  CHIEF COMPLAINT:  Chief Complaint  Patient presents with   New Patient (Initial Visit)    Rm 13. Accompanied by girlfriend. NP/Paper/[facility]ye[facility] office/Eboni Farmer NP/seizures.    HISTORY OF PRESENT ILLNESS:  This is a 51 year old woman with past medical history hypertension, anxiety, seizure disorder who is presenting to establish care.  Patient reports history of seizures since childhood.  Seizure has been on and off per patient and she is currently on Dilantin 200 mg twice daily, reports being on Dilantin for many years.  She stated that she was on 1 medication in the past but does not remember the name.  She reports sometimes when she is in pain she will have seizures.  Her last seizure was associated with her twisting her left ankle, this was a month ago.  Prior to that her seizure was a month before and not triggered by pain.  She is compliant with the medication.  She denies any seizure risk factors and denies any major side effect with the Dilantin.  Her seizures are described as generalized convulsion followed by postictal confusion and tiredness.  Girlfriend reports that she sleeps the rest of the day after having a seizure.  No major injury associated with the seizures but she bit her tongue in the past and had urinary incontinence.   Handedness: Right handed   Onset: Since childhood   Seizure Type: Generalized convulsion   Current frequency: 1 a month but can go months without seizures   Any injuries from seizures: Tongue biting  Seizure risk factors: Denies   Previous ASMs: Dilantin and another one that she does not remember the name   Currenty ASMs: Dilantin   ASMs side effects: None   Brain Images: Normal head CT   Previous EEGs: None available  for review    OTHER MEDICAL CONDITIONS: Hypertension, Anxiety, Seizure  REVIEW OF SYSTEMS: Full 14 system review of systems performed and negative with exception of: As noted in the HPI   ALLERGIES: Allergies  Allergen Reactions   Benadryl [Diphenhydramine Hcl] Rash    HOME MEDICATIONS: Outpatient Medications Prior to Visit  Medication Sig Dispense Refill   ALPRAZolam (XANAX) 0.5 MG tablet Take 0.5 mg by mouth 2 (two) times daily as needed for anxiety.   5   amLODipine (NORVASC) 5 MG tablet SMARTSIG:1 Tablet(s) By Mouth Every Evening     DULoxetine (CYMBALTA) 30 MG capsule Take 30 mg by mouth daily.     estradiol (ESTRACE) 0.5 MG tablet Take 1 tablet (0.5 mg total) by mouth daily. 30 tablet 11   ezetimibe (ZETIA) 10 MG tablet Take 10 mg by mouth daily.     irbesartan (AVAPRO) 75 MG tablet Take 300 mg by mouth daily at 2 PM.  12   LINZESS 145 MCG CAPS capsule TAKE ONE CAPSULE BY MOUTH BEFORE BREAKFAST 30 capsule 11   metoprolol succinate (TOPROL-XL) 25 MG 24 hr tablet Take 25 mg by mouth daily.     ondansetron (ZOFRAN) 4 MG tablet Take 1 tablet (4 mg total) by mouth every 6 (six) hours. (Patient taking differently: Take 4 mg by mouth every 8 (eight) hours as needed for nausea or vomiting.) 12 tablet 0   oxyCODONE-acetaminophen (PERCOCET/ROXICET) 5-325 MG tablet Take 1 tablet by mouth every 4 (four) hours as needed for  severe pain (pain.). 30 tablet 0   pantoprazole (PROTONIX) 40 MG tablet Take 1 tablet (40 mg total) by mouth 2 (two) times daily. 180 tablet 3   phenytoin (DILANTIN) 100 MG ER capsule Take 200 mg by mouth in the morning and at bedtime.      pregabalin (LYRICA) 50 MG capsule pregabalin 50 mg capsule     progesterone (PROMETRIUM) 100 MG capsule Take 1 capsule (100 mg total) by mouth See admin instructions. Take 1 capsule (100 mg) by mouth daily x 14 days every 3 months 14 capsule 6   Vibegron (GEMTESA) 75 MG TABS Gemtesa 75 mg tablet  Take 1 tablet every day by oral route.      fluticasone (FLONASE) 50 MCG/ACT nasal spray Place 2 sprays into both nostrils daily as needed for allergies.     hydrocortisone (ANUSOL-HC) 2.5 % rectal cream Place 1 application rectally 2 (two) times daily. 30 g 1   No facility-administered medications prior to visit.    PAST MEDICAL HISTORY: Past Medical History:  Diagnosis Date   Anxiety    Arthritis    Balance problems    Chronic abdominal pain    Chronic knee pain    Constipation    Difficulty walking    GERD (gastroesophageal reflux disease)    Headache    HTN (hypertension)    Low back pain    Panic attacks    Polyuria    Seizures (HCC)    unknown etiology; on dilantin; last seizures was a few years ago.   Sleep apnea     PAST SURGICAL HISTORY: Past Surgical History:  Procedure Laterality Date   BACK SURGERY     complicated by ureteral injury   CHONDROPLASTY Right 08/31/2016   Procedure: CHONDROPLASTY RIGHT FEMUR;  Surgeon: Vickki Hearing, MD;  Location: [facility];  Service: Orthopedics;  Laterality: Right;   COLONOSCOPY WITH PROPOFOL N/A 12/19/2019   Procedure: COLONOSCOPY WITH PROPOFOL;  Surgeon: Corbin Ade, Nonbleeding external and internal grade 3 hemorrhoids, three 2-17mm polyps, otherwise normal.  Suspected benign anorectal bleeding.  Pathology revealed hyperplastic polyps.  Repeat colonoscopy in 10 years.   ESOPHAGOGASTRODUODENOSCOPY N/A 09/11/2015   Procedure: ESOPHAGOGASTRODUODENOSCOPY (EGD);  Surgeon: Corbin Ade, MD;  Normal esophagus s/p dilation, few gastric polyps resected and retrieved, normal duodenum. Gastric polyps benign.    ESOPHAGOGASTRODUODENOSCOPY (EGD) WITH ESOPHAGEAL DILATION N/A 03/20/2013   Dr. Rourk:Normal EGD-status post passage of a Maloney dilator/Status post esophageal biopsy., benign path   ESOPHAGOGASTRODUODENOSCOPY (EGD) WITH PROPOFOL N/A 12/19/2019   Procedure: ESOPHAGOGASTRODUODENOSCOPY (EGD) WITH PROPOFOL;  Surgeon: Corbin Ade, MD;  Normal esophagus s/p dilation,  benign-appearing fundic gland polyps, normal examined duodenum.   KNEE ARTHROSCOPY WITH MEDIAL MENISECTOMY Right 08/31/2016   Procedure: KNEE ARTHROSCOPY WITH MEDIAL MENISECTOMY;  Surgeon: Vickki Hearing, MD;  Location: [facility];  Service: Orthopedics;  Laterality: Right;   KNEE ARTHROSCOPY WITH MEDIAL MENISECTOMY Right 09/13/2018   Procedure: KNEE ARTHROSCOPY WITH MEDIAL MENISCECTOMY AND MICROFRACTURE;  Surgeon: Vickki Hearing, MD;  Location: [facility];  Service: Orthopedics;  Laterality: Right;   LUMBAR LAMINECTOMY/DECOMPRESSION MICRODISCECTOMY Left 02/12/2021   Procedure: Left Lumbar Five- Sacral One Microdiscectomy;  Surgeon: Tia Alert, MD;  Location: [facility];  Service: Neurosurgery;  Laterality: Left;  Left Lumbar Five- Sacral One Microdiscectomy   MALONEY DILATION N/A 09/11/2015   Procedure: Elease Hashimoto DILATION;  Surgeon: Corbin Ade, MD;  Location: [facility];  Service: Endoscopy;  Laterality: N/A;   MALONEY DILATION N/A 12/19/2019  Procedure: MALONEY DILATION;  Surgeon: Corbin Ade, MD;  Location: [facility];  Service: Endoscopy;  Laterality: N/A;   POLYPECTOMY  12/19/2019   Procedure: POLYPECTOMY;  Surgeon: Corbin Ade, MD;  Location: [facility];  Service: Endoscopy;;    FAMILY HISTORY: Family History  Problem Relation Age of Onset   Throat cancer Father    Heart disease Father    Cancer Father    Hypertension Father    Diabetes Father    Other Mother        esophageal stricture and gallbladder disease   Thyroid disease Mother    Other Sister        esophageal stricture and gallbladder problems.    Diabetes Sister    Hypertension Sister    Thyroid disease Sister    Heart disease Maternal Grandfather    Diabetes Maternal Grandfather    Arthritis Maternal Grandfather    Colon cancer Neg Hx    Liver disease Neg Hx    Pancreatic cancer Neg Hx     SOCIAL HISTORY: Social History   Socioeconomic History   Marital status: Single    Spouse name: Not on  file   Number of children: 0   Years of education: 12   Highest education level: Not on file  Occupational History   Occupation: disability    Employer: DISABLED  Tobacco Use   Smoking status: Former    Packs/day: 2.00    Years: 9.00    Total pack years: 18.00    Types: Cigarettes    Quit date: 04/04/1994    Years since quitting: 27.8   Smokeless tobacco: Never   Tobacco comments:    smoked 8 years, quit 18 years ago in the 1996  Vaping Use   Vaping Use: Never used  Substance and Sexual Activity   Alcohol use: No    Alcohol/week: 0.0 standard drinks of alcohol   Drug use: No   Sexual activity: Not Currently    Birth control/protection: Post-menopausal  Other Topics Concern   Not on file  Social History Narrative   Not on file   Social Determinants of Health   Financial Resource Strain: Low Risk  (08/20/2021)   Overall Financial Resource Strain (CARDIA)    Difficulty of Paying Living Expenses: Not hard at all  Food Insecurity: No Food Insecurity (08/20/2021)   Hunger Vital Sign    Worried About Running Out of Food in the Last Year: Never true    Ran Out of Food in the Last Year: Never true  Transportation Needs: No Transportation Needs (08/20/2021)   PRAPARE - Administrator, Civil Service (Medical): No    Lack of Transportation (Non-Medical): No  Physical Activity: Insufficiently Active (08/20/2021)   Exercise Vital Sign    Days of Exercise per Week: 3 days    Minutes of Exercise per Session: 30 min  Stress: No Stress Concern Present (08/20/2021)   [employer] of Occupational Health - Occupational Stress Questionnaire    Feeling of Stress : Only a little  Social Connections: Unknown (08/20/2021)   Social Connection and Isolation Panel [NHANES]    Frequency of Communication with Friends and Family: More than three times a week    Frequency of Social Gatherings with Friends and Family: More than three times a week    Attends Religious Services: 1 to 4  times per year    Active Member of [employer] or Organizations: No    Attends Banker  Meetings: Never    Marital Status: Patient refused  Intimate Partner Violence: Not At Risk (08/20/2021)   Humiliation, Afraid, Rape, and Kick questionnaire    Fear of Current or Ex-Partner: No    Emotionally Abused: No    Physically Abused: No    Sexually Abused: No    PHYSICAL EXAM  GENERAL EXAM/CONSTITUTIONAL: Vitals:  Vitals:   02/17/22 0833  BP: 136/89  Pulse: 86  Weight: 176 lb (79.8 kg)  Height:  (1.676 m)   Body mass index is 28.41 kg/m. Wt Readings from Last 3 Encounters:  02/17/22 176 lb (79.8 kg)  10/14/21 176 lb 9.6 oz (80.1 kg)  09/23/21 178 lb 12.8 oz (81.1 kg)   Patient is in no distress; well developed, nourished and groomed; neck is supple  EYES: Visual fields full to confrontation, Extraocular movements intacts,  No results found.  MUSCULOSKELETAL: Gait, strength, tone, movements noted in Neurologic exam below  NEUROLOGIC: MENTAL STATUS:      No data to display         awake, alert, oriented to person, place and time recent and remote memory intact normal attention and concentration language fluent, comprehension intact, naming intact fund of knowledge appropriate  CRANIAL NERVE:  2nd, 3rd, 4th, 6th - Visual fields full to confrontation, extraocular muscles intact, no nystagmus 5th - facial sensation symmetric 7th - facial strength symmetric 8th - hearing intact 9th - palate elevates symmetrically, uvula midline 11th - shoulder shrug symmetric 12th - tongue protrusion midline  MOTOR:  normal bulk and tone, full strength in the BUE, BLE  SENSORY:  normal and symmetric to light touch  COORDINATION:  finger-nose-finger, fine finger movements normal  REFLEXES:  deep tendon reflexes present and symmetric  GAIT/STATION:  normal     DIAGNOSTIC DATA (LABS, IMAGING, TESTING) - I reviewed patient records, labs, notes, testing and  imaging myself where available.  Lab Results  Component Value Date   WBC 6.6 08/27/2021   HGB 13.4 08/27/2021   HCT 37.8 08/27/2021   MCV 89 08/27/2021   PLT 313 08/27/2021      Component Value Date/Time   NA 137 02/09/2021 1150   K 4.8 02/09/2021 1150   CL 98 02/09/2021 1150   CO2 28 02/09/2021 1150   GLUCOSE 89 02/09/2021 1150   BUN 10 02/09/2021 1150   CREATININE 0.76 02/09/2021 1150   CALCIUM 9.9 02/09/2021 1150   PROT 9.0 (H) 12/26/2017 1753   ALBUMIN 4.7 12/26/2017 1753   AST 20 12/26/2017 1753   AST 15 10/19/2013 0000   ALT 18 12/26/2017 1753   ALKPHOS 86 12/26/2017 1753   ALKPHOS 73 10/19/2013 0000   BILITOT 0.6 12/26/2017 1753   BILITOT 0.3 10/19/2013 0000   GFRNONAA >60 02/09/2021 1150   GFRAA >60 07/29/2019 0830   No results found for: "CHOL", "HDL", "LDLCALC", "LDLDIRECT", "TRIG" No results found for: "HGBA1C" No results found for: "VITAMINB12" Lab Results  Component Value Date   TSH 1.340 09/12/2018    CT Head 10/07/21 Normal head CT for age.  No explanation for patient's symptoms.      ASSESSMENT AND PLAN  51 y.o. year old female  with anxiety, hypertension and seizure disorder who is presenting to establish care.  Currently she is on Dilantin 200 mg twice daily but continued to have breakthrough seizure.  First I will obtain a routine EEG but I will switch her from Dilantin to lamotrigine.  The titration was explained to the patient.  I will  see her in 8 weeks to obtain level, at that time we can start decreasing the Dilantin.  She voices understanding.  Advised her to contact me if she has a breakthrough seizure or any other concerns.   1. Nonintractable epilepsy without status epilepticus, unspecified epilepsy type [facility])     Patient Instructions  Routine EEG, I will contact you to go over the results Continue with Dilantin 200 mg twice daily Start lamotrigine 25 mg daily for 1 week, then increase to 25 mg twice daily for 1 week, then increase to  50 mg twice daily for 1 week, then increase to 75 mg twice daily for 1 week, then increase to 100 mg p.o. Follow-up in 8 weeks, at that time we will obtain a lamotrigine level and start weaning of Dilantin   Per [facility] statutes, patients with seizures are not allowed to drive until they have been seizure-free for six months.  Other recommendations include using caution when using heavy equipment or power tools. Avoid working on ladders or at heights. Take showers instead of baths.  Do not swim alone.  Ensure the water temperature is not too high on the home water heater. Do not go swimming alone. Do not lock yourself in a room alone (i.e. bathroom). When caring for infants or small children, sit down when holding, feeding, or changing them to minimize risk of injury to the child in the event you have a seizure. Maintain good sleep hygiene. Avoid alcohol.  Also recommend adequate sleep, hydration, good diet and minimize stress.   During the Seizure  - First, ensure adequate ventilation and place patients on the floor on their left side  Loosen clothing around the neck and ensure the airway is patent. If the patient is clenching the teeth, do not force the mouth open with any object as this can cause severe damage - Remove all items from the surrounding that can be hazardous. The patient may be oblivious to what's happening and may not even know what he or she is doing. If the patient is confused and wandering, either gently guide him/her away and block access to outside areas - Reassure the individual and be comforting - Call 911. In most cases, the seizure ends before EMS arrives. However, there are cases when seizures may last over 3 to 5 minutes. Or the individual may have developed breathing difficulties or severe injuries. If a pregnant patient or a person with diabetes develops a seizure, it is prudent to call an ambulance. - Finally, if the patient does not regain full consciousness,  then call EMS. Most patients will remain confused for about 45 to 90 minutes after a seizure, so you must use judgment in calling for help. - Avoid restraints but make sure the patient is in a bed with padded side rails - Place the individual in a lateral position with the neck slightly flexed; this will help the saliva drain from the mouth and prevent the tongue from falling backward - Remove all nearby furniture and other hazards from the area - Provide verbal assurance as the individual is regaining consciousness - Provide the patient with privacy if possible - Call for help and start treatment as ordered by the caregiver   After the Seizure (Postictal Stage)  After a seizure, most patients experience confusion, fatigue, muscle pain and/or a headache. Thus, one should permit the individual to sleep. For the next few days, reassurance is essential. Being calm and helping reorient the person is also  of importance.  Most seizures are painless and end spontaneously. Seizures are not harmful to others but can lead to complications such as stress on the lungs, brain and the heart. Individuals with prior lung problems may develop labored breathing and respiratory distress.     Orders Placed This Encounter  Procedures   EEG adult    Meds ordered this encounter  Medications   lamoTRIgine (LAMICTAL) 25 MG tablet    Sig: Take 1 tablet (25 mg total) by mouth at bedtime for 7 days, THEN 1 tablet (25 mg total) 2 (two) times daily for 7 days, THEN 2 tablets (50 mg total) 2 (two) times daily for 7 days, THEN 3 tablets (75 mg total) 2 (two) times daily for 7 days, THEN 4 tablets (100 mg total) 2 (two) times daily for 7 days.    Dispense:  240 tablet    Refill:  0    Return in about 8 weeks (around 04/14/2022).    Windell Norfolk, MD 02/17/2022, 9:28 AM  [facility] [address] [place], [place] [address] [phone]

## 2022-02-22 ENCOUNTER — Ambulatory Visit (HOSPITAL_COMMUNITY): Payer: Medicare Other

## 2022-02-22 ENCOUNTER — Inpatient Hospital Stay (HOSPITAL_COMMUNITY): Admission: RE | Admit: 2022-02-22 | Payer: Medicare Other | Source: Ambulatory Visit

## 2022-02-22 DIAGNOSIS — E782 Mixed hyperlipidemia: Secondary | ICD-10-CM | POA: Diagnosis not present

## 2022-02-22 DIAGNOSIS — R7303 Prediabetes: Secondary | ICD-10-CM | POA: Diagnosis not present

## 2022-02-26 DIAGNOSIS — R059 Cough, unspecified: Secondary | ICD-10-CM | POA: Diagnosis not present

## 2022-03-02 DIAGNOSIS — M79644 Pain in right finger(s): Secondary | ICD-10-CM | POA: Diagnosis not present

## 2022-03-02 DIAGNOSIS — K219 Gastro-esophageal reflux disease without esophagitis: Secondary | ICD-10-CM | POA: Diagnosis not present

## 2022-03-02 DIAGNOSIS — G8929 Other chronic pain: Secondary | ICD-10-CM | POA: Diagnosis not present

## 2022-03-02 DIAGNOSIS — N3281 Overactive bladder: Secondary | ICD-10-CM | POA: Diagnosis not present

## 2022-03-02 DIAGNOSIS — E875 Hyperkalemia: Secondary | ICD-10-CM | POA: Insufficient documentation

## 2022-03-02 DIAGNOSIS — F329 Major depressive disorder, single episode, unspecified: Secondary | ICD-10-CM | POA: Insufficient documentation

## 2022-03-02 DIAGNOSIS — R000 Tachycardia, unspecified: Secondary | ICD-10-CM | POA: Diagnosis not present

## 2022-03-02 DIAGNOSIS — I10 Essential (primary) hypertension: Secondary | ICD-10-CM | POA: Diagnosis not present

## 2022-03-02 DIAGNOSIS — R7303 Prediabetes: Secondary | ICD-10-CM | POA: Diagnosis not present

## 2022-03-02 DIAGNOSIS — E782 Mixed hyperlipidemia: Secondary | ICD-10-CM | POA: Diagnosis not present

## 2022-03-15 ENCOUNTER — Ambulatory Visit (HOSPITAL_COMMUNITY)
Admission: RE | Admit: 2022-03-15 | Discharge: 2022-03-15 | Disposition: A | Discharge status: Home / Self Care | Payer: Medicare Other | Source: Ambulatory Visit | Attending: Family Medicine | Admitting: Family Medicine

## 2022-03-15 DIAGNOSIS — N6325 Unspecified lump in the left breast, overlapping quadrants: Secondary | ICD-10-CM | POA: Insufficient documentation

## 2022-03-15 DIAGNOSIS — N6320 Unspecified lump in the left breast, unspecified quadrant: Secondary | ICD-10-CM | POA: Diagnosis present

## 2022-03-15 DIAGNOSIS — R928 Other abnormal and inconclusive findings on diagnostic imaging of breast: Secondary | ICD-10-CM | POA: Diagnosis not present

## 2022-03-15 DIAGNOSIS — N6321 Unspecified lump in the left breast, upper outer quadrant: Secondary | ICD-10-CM | POA: Diagnosis not present

## 2022-03-15 DIAGNOSIS — N6322 Unspecified lump in the left breast, upper inner quadrant: Secondary | ICD-10-CM | POA: Diagnosis not present

## 2022-03-24 ENCOUNTER — Encounter: Payer: Self-pay | Admitting: Neurology

## 2022-03-24 ENCOUNTER — Ambulatory Visit (INDEPENDENT_AMBULATORY_CARE_PROVIDER_SITE_OTHER): Payer: Medicare Other | Admitting: Neurology

## 2022-03-24 DIAGNOSIS — G40909 Epilepsy, unspecified, not intractable, without status epilepticus: Secondary | ICD-10-CM

## 2022-03-24 NOTE — Procedures (Signed)
    History:  51 year old woman with epilepsy   EEG classification: Awake and drowsy  Description of the recording: The background rhythms of this recording consists of a fairly well modulated medium amplitude alpha rhythm of 10-11 Hz that is reactive to eye opening and closure. Present in the anterior head region is a 15-20 Hz beta activity. Photic stimulation was performed, did not show any abnormalities. Hyperventilation was also performed, did not show any abnormalities. Drowsiness was manifested by background fragmentation. No abnormal epileptiform discharges seen during this recording. There was no focal slowing. There were no electrographic seizure identified.   Abnormality: None   Impression: This is a normal EEG recorded while drowsy and awake. No evidence of interictal epileptiform discharges. Normal EEGs, however, do not rule out epilepsy.    Windell Norfolk, MD [facility]

## 2022-04-05 ENCOUNTER — Telehealth: Payer: Self-pay | Admitting: Neurology

## 2022-04-05 ENCOUNTER — Other Ambulatory Visit: Payer: Self-pay | Admitting: Neurology

## 2022-04-05 MED ORDER — LAMOTRIGINE 100 MG PO TABS: 100.0000 mg | ORAL_TABLET | Freq: Two times a day (BID) | ORAL | 3 refills | Status: DC

## 2022-04-05 NOTE — Telephone Encounter (Signed)
[facility]) calling to verify dosage for amoTRIgine (LAMICTAL) 25 MG tablet (Expired)   Contact info: [phone]

## 2022-04-05 NOTE — Telephone Encounter (Signed)
I called and provided clarification for 100 mg bid. Ariel is who I spoke with and she verbalized understanding/appreciation for the call.

## 2022-04-05 NOTE — Telephone Encounter (Signed)
Pt has successfully titrated up to 100 mg bid. I have sent refill as requested to [facility] and notified pt via my chart.

## 2022-04-05 NOTE — Telephone Encounter (Signed)
Pharmacy called stating that they are needing a refill request sent to them for the pt's lamoTRIgine (LAMICTAL) 25 MG tablet (Expired)  Pt informed them that it was changed the way she is to take it from now on.

## 2022-04-14 ENCOUNTER — Ambulatory Visit (INDEPENDENT_AMBULATORY_CARE_PROVIDER_SITE_OTHER): Payer: 59 | Admitting: Neurology

## 2022-04-14 ENCOUNTER — Encounter: Payer: Self-pay | Admitting: Neurology

## 2022-04-14 VITALS — BP 124/81 | HR 84 | Ht 66.0 in | Wt 176.0 lb

## 2022-04-14 DIAGNOSIS — Z5181 Encounter for therapeutic drug level monitoring: Secondary | ICD-10-CM

## 2022-04-14 DIAGNOSIS — G40909 Epilepsy, unspecified, not intractable, without status epilepticus: Secondary | ICD-10-CM

## 2022-04-14 MED ORDER — LAMOTRIGINE 100 MG PO TABS: 100.0000 mg | ORAL_TABLET | Freq: Two times a day (BID) | ORAL | 3 refills | Status: DC

## 2022-04-14 NOTE — Patient Instructions (Signed)
Increase lamotrigine to 100 mg twice daily Continue with phenytoin 200 mg twice daily for a few days, then decrease phenytoin to 200 mg nightly for 1 week then further decrease to 100 mg nightly for 1 week then stop Will check lamotrigine level today with BMP Follow-up in 3 months or sooner if worse.

## 2022-04-14 NOTE — Progress Notes (Signed)
[facility]  PATIENT: [name] DOB: 08-18-1970  REQUESTING CLINICIAN: Benita Stabile, MD HISTORY FROM: Patient and girlfriend  REASON FOR VISIT: Establish care for Epilepsy    HISTORICAL  CHIEF COMPLAINT:  Chief Complaint  Patient presents with   Follow-up    Rm 12,  States she is doing well on Lamictal 100 mg, reports no seizures     INTERVAL HISTORY 04/14/2022:  Patient presents today for follow-up she is accompanied by her girlfriend.  At last visit plan was to start lamotrigine.  Currently she is on lamotrigine 100 mg daily instead of 100 mg twice daily.  She is still on the phenytoin 200 mg twice daily.  She tolerates the medication very well.  Denies any side effect from the medication.  Overall doing well, no complaints, no concerns.   HISTORY OF PRESENT ILLNESS:  This is a 52 year old woman with past medical history hypertension, anxiety, seizure disorder who is presenting to establish care.  Patient reports history of seizures since childhood.  Seizure has been on and off per patient and she is currently on Dilantin 200 mg twice daily, reports being on Dilantin for many years.  She stated that she was on 1 medication in the past but does not remember the name.  She reports sometimes when she is in pain she will have seizures.  Her last seizure was associated with her twisting her left ankle, this was a month ago.  Prior to that her seizure was a month before and not triggered by pain.  She is compliant with the medication.  She denies any seizure risk factors and denies any major side effect with the Dilantin.  Her seizures are described as generalized convulsion followed by postictal confusion and tiredness.  Girlfriend reports that she sleeps the rest of the day after having a seizure.  No major injury associated with the seizures but she bit her tongue in the past and had urinary incontinence.   Handedness: Right handed   Onset: Since childhood    Seizure Type: Generalized convulsion   Current frequency: 1 a month but can go months without seizures   Any injuries from seizures: Tongue biting  Seizure risk factors: Denies   Previous ASMs: Dilantin and another one that she does not remember the name   Currenty ASMs: Dilantin and Lamotrigine   ASMs side effects: None   Brain Images: Normal head CT   Previous EEGs: None available for review    OTHER MEDICAL CONDITIONS: Hypertension, Anxiety, Seizure  REVIEW OF SYSTEMS: Full 14 system review of systems performed and negative with exception of: As noted in the HPI   ALLERGIES: Allergies  Allergen Reactions   Benadryl [Diphenhydramine Hcl] Rash    HOME MEDICATIONS: Outpatient Medications Prior to Visit  Medication Sig Dispense Refill   ALPRAZolam (XANAX) 0.5 MG tablet Take 0.5 mg by mouth 2 (two) times daily as needed for anxiety.   5   amLODipine (NORVASC) 5 MG tablet SMARTSIG:1 Tablet(s) By Mouth Every Evening     DULoxetine (CYMBALTA) 30 MG capsule Take 30 mg by mouth daily.     estradiol (ESTRACE) 0.5 MG tablet Take 1 tablet (0.5 mg total) by mouth daily. 30 tablet 11   ezetimibe (ZETIA) 10 MG tablet Take 10 mg by mouth daily.     irbesartan (AVAPRO) 75 MG tablet Take 300 mg by mouth daily at 2 PM.  12   LINZESS 145 MCG CAPS capsule TAKE ONE CAPSULE BY MOUTH BEFORE  BREAKFAST 30 capsule 11   metoprolol succinate (TOPROL-XL) 25 MG 24 hr tablet Take 25 mg by mouth daily.     ondansetron (ZOFRAN) 4 MG tablet Take 1 tablet (4 mg total) by mouth every 6 (six) hours. (Patient taking differently: Take 4 mg by mouth every 8 (eight) hours as needed for nausea or vomiting.) 12 tablet 0   oxyCODONE-acetaminophen (PERCOCET/ROXICET) 5-325 MG tablet Take 1 tablet by mouth every 4 (four) hours as needed for severe pain (pain.). 30 tablet 0   pantoprazole (PROTONIX) 40 MG tablet Take 1 tablet (40 mg total) by mouth 2 (two) times daily. 180 tablet 3   pregabalin (LYRICA) 50 MG  capsule pregabalin 50 mg capsule     progesterone (PROMETRIUM) 100 MG capsule Take 1 capsule (100 mg total) by mouth See admin instructions. Take 1 capsule (100 mg) by mouth daily x 14 days every 3 months 14 capsule 6   Vibegron (GEMTESA) 75 MG TABS Gemtesa 75 mg tablet  Take 1 tablet every day by oral route.     lamoTRIgine (LAMICTAL) 100 MG tablet Take 1 tablet (100 mg total) by mouth 2 (two) times daily. 60 tablet 3   lamoTRIgine (LAMICTAL) 100 MG tablet Take 100 mg by mouth daily.     phenytoin (DILANTIN) 100 MG ER capsule Take 200 mg by mouth in the morning and at bedtime.      lamoTRIgine (LAMICTAL) 25 MG tablet Take 1 tablet (25 mg total) by mouth at bedtime for 7 days, THEN 1 tablet (25 mg total) 2 (two) times daily for 7 days, THEN 2 tablets (50 mg total) 2 (two) times daily for 7 days, THEN 3 tablets (75 mg total) 2 (two) times daily for 7 days, THEN 4 tablets (100 mg total) 2 (two) times daily for 7 days. 240 tablet 0   No facility-administered medications prior to visit.    PAST MEDICAL HISTORY: Past Medical History:  Diagnosis Date   Anxiety    Arthritis    Balance problems    Chronic abdominal pain    Chronic knee pain    Constipation    Difficulty walking    GERD (gastroesophageal reflux disease)    Headache    HTN (hypertension)    Low back pain    Panic attacks    Polyuria    Seizures ([place])    unknown etiology; on dilantin; last seizures was a few years ago.   Sleep apnea     PAST SURGICAL HISTORY: Past Surgical History:  Procedure Laterality Date   BACK SURGERY     complicated by ureteral injury   CHONDROPLASTY Right 08/31/2016   Procedure: CHONDROPLASTY RIGHT FEMUR;  Surgeon: Carole Civil, MD;  Location: [facility];  Service: Orthopedics;  Laterality: Right;   COLONOSCOPY WITH PROPOFOL N/A 12/19/2019   Procedure: COLONOSCOPY WITH PROPOFOL;  Surgeon: Daneil Dolin, Nonbleeding external and internal grade 3 hemorrhoids, three 2-68mm polyps, otherwise  normal.  Suspected benign anorectal bleeding.  Pathology revealed hyperplastic polyps.  Repeat colonoscopy in 10 years.   ESOPHAGOGASTRODUODENOSCOPY N/A 09/11/2015   Procedure: ESOPHAGOGASTRODUODENOSCOPY (EGD);  Surgeon: Daneil Dolin, MD;  Normal esophagus s/p dilation, few gastric polyps resected and retrieved, normal duodenum. Gastric polyps benign.    ESOPHAGOGASTRODUODENOSCOPY (EGD) WITH ESOPHAGEAL DILATION N/A 03/20/2013   Dr. Rourk:Normal EGD-status post passage of a Maloney dilator/Status post esophageal biopsy., benign path   ESOPHAGOGASTRODUODENOSCOPY (EGD) WITH PROPOFOL N/A 12/19/2019   Procedure: ESOPHAGOGASTRODUODENOSCOPY (EGD) WITH PROPOFOL;  Surgeon: Daneil Dolin, MD;  Normal esophagus s/p dilation, benign-appearing fundic gland polyps, normal examined duodenum.   KNEE ARTHROSCOPY WITH MEDIAL MENISECTOMY Right 08/31/2016   Procedure: KNEE ARTHROSCOPY WITH MEDIAL MENISECTOMY;  Surgeon: Vickki Hearing, MD;  Location: [facility];  Service: Orthopedics;  Laterality: Right;   KNEE ARTHROSCOPY WITH MEDIAL MENISECTOMY Right 09/13/2018   Procedure: KNEE ARTHROSCOPY WITH MEDIAL MENISCECTOMY AND MICROFRACTURE;  Surgeon: Vickki Hearing, MD;  Location: [facility];  Service: Orthopedics;  Laterality: Right;   LUMBAR LAMINECTOMY/DECOMPRESSION MICRODISCECTOMY Left 02/12/2021   Procedure: Left Lumbar Five- Sacral One Microdiscectomy;  Surgeon: Tia Alert, MD;  Location: [facility] OR;  Service: Neurosurgery;  Laterality: Left;  Left Lumbar Five- Sacral One Microdiscectomy   MALONEY DILATION N/A 09/11/2015   Procedure: Elease Hashimoto DILATION;  Surgeon: Corbin Ade, MD;  Location: [facility];  Service: Endoscopy;  Laterality: N/A;   MALONEY DILATION N/A 12/19/2019   Procedure: Elease Hashimoto DILATION;  Surgeon: Corbin Ade, MD;  Location: [facility];  Service: Endoscopy;  Laterality: N/A;   POLYPECTOMY  12/19/2019   Procedure: POLYPECTOMY;  Surgeon: Corbin Ade, MD;  Location: [facility];  Service:  Endoscopy;;    FAMILY HISTORY: Family History  Problem Relation Age of Onset   Throat cancer Father    Heart disease Father    Cancer Father    Hypertension Father    Diabetes Father    Other Mother        esophageal stricture and gallbladder disease   Thyroid disease Mother    Other Sister        esophageal stricture and gallbladder problems.    Diabetes Sister    Hypertension Sister    Thyroid disease Sister    Heart disease Maternal Grandfather    Diabetes Maternal Grandfather    Arthritis Maternal Grandfather    Colon cancer Neg Hx    Liver disease Neg Hx    Pancreatic cancer Neg Hx     SOCIAL HISTORY: Social History   Socioeconomic History   Marital status: Significant Other    Spouse name: Not on file   Number of children: 0   Years of education: 12   Highest education level: Not on file  Occupational History   Occupation: disability    Employer: DISABLED  Tobacco Use   Smoking status: Former    Packs/day: 2.00    Years: 9.00    Total pack years: 18.00    Types: Cigarettes    Quit date: 04/04/1994    Years since quitting: 28.0   Smokeless tobacco: Never   Tobacco comments:    smoked 8 years, quit 18 years ago in the 1996  Vaping Use   Vaping Use: Never used  Substance and Sexual Activity   Alcohol use: No    Alcohol/week: 0.0 standard drinks of alcohol   Drug use: No   Sexual activity: Not Currently    Birth control/protection: Post-menopausal  Other Topics Concern   Not on file  Social History Narrative   Not on file   Social Determinants of Health   Financial Resource Strain: Low Risk  (08/20/2021)   Overall Financial Resource Strain (CARDIA)    Difficulty of Paying Living Expenses: Not hard at all  Food Insecurity: No Food Insecurity (08/20/2021)   Hunger Vital Sign    Worried About Running Out of Food in the Last Year: Never true    Ran Out of Food in the Last Year: Never true  Transportation Needs: No Transportation Needs (08/20/2021)  PRAPARE - Administrator, Civil Service (Medical): No    Lack of Transportation (Non-Medical): No  Physical Activity: Insufficiently Active (08/20/2021)   Exercise Vital Sign    Days of Exercise per Week: 3 days    Minutes of Exercise per Session: 30 min  Stress: No Stress Concern Present (08/20/2021)   [employer] of Occupational Health - Occupational Stress Questionnaire    Feeling of Stress : Only a little  Social Connections: Unknown (08/20/2021)   Social Connection and Isolation Panel [NHANES]    Frequency of Communication with Friends and Family: More than three times a week    Frequency of Social Gatherings with Friends and Family: More than three times a week    Attends Religious Services: 1 to 4 times per year    Active Member of [employer] or Organizations: No    Attends Banker Meetings: Never    Marital Status: Patient refused  Intimate Partner Violence: Not At Risk (08/20/2021)   Humiliation, Afraid, Rape, and Kick questionnaire    Fear of Current or Ex-Partner: No    Emotionally Abused: No    Physically Abused: No    Sexually Abused: No    PHYSICAL EXAM  GENERAL EXAM/CONSTITUTIONAL: Vitals:  Vitals:   04/14/22 0756  BP: 124/81  Pulse: 84  Weight: 176 lb (79.8 kg)  Height: 5\' 6"  (1.676 m)   Body mass index is 28.41 kg/m. Wt Readings from Last 3 Encounters:  04/14/22 176 lb (79.8 kg)  02/17/22 176 lb (79.8 kg)  10/14/21 176 lb 9.6 oz (80.1 kg)   Patient is in no distress; well developed, nourished and groomed; neck is supple  EYES: Visual fields full to confrontation, Extraocular movements intacts,  No results found.  MUSCULOSKELETAL: Gait, strength, tone, movements noted in Neurologic exam below  NEUROLOGIC: MENTAL STATUS:      No data to display         awake, alert, oriented to person, place and time recent and remote memory intact normal attention and concentration language fluent, comprehension intact, naming  intact fund of knowledge appropriate  CRANIAL NERVE:  2nd, 3rd, 4th, 6th - Visual fields full to confrontation, extraocular muscles intact, no nystagmus 5th - facial sensation symmetric 7th - facial strength symmetric 8th - hearing intact 9th - palate elevates symmetrically, uvula midline 11th - shoulder shrug symmetric 12th - tongue protrusion midline  MOTOR:  normal bulk and tone, full strength in the BUE, BLE  SENSORY:  normal and symmetric to light touch  COORDINATION:  finger-nose-finger, fine finger movements normal  GAIT/STATION:  normal     DIAGNOSTIC DATA (LABS, IMAGING, TESTING) - I reviewed patient records, labs, notes, testing and imaging myself where available.  Lab Results  Component Value Date   WBC 6.6 08/27/2021   HGB 13.4 08/27/2021   HCT 37.8 08/27/2021   MCV 89 08/27/2021   PLT 313 08/27/2021      Component Value Date/Time   NA 137 02/09/2021 1150   K 4.8 02/09/2021 1150   CL 98 02/09/2021 1150   CO2 28 02/09/2021 1150   GLUCOSE 89 02/09/2021 1150   BUN 10 02/09/2021 1150   CREATININE 0.76 02/09/2021 1150   CALCIUM 9.9 02/09/2021 1150   PROT 9.0 (H) 12/26/2017 1753   ALBUMIN 4.7 12/26/2017 1753   AST 20 12/26/2017 1753   AST 15 10/19/2013 0000   ALT 18 12/26/2017 1753   ALKPHOS 86 12/26/2017 1753   ALKPHOS 73 10/19/2013 0000  BILITOT 0.6 12/26/2017 1753   BILITOT 0.3 10/19/2013 0000   GFRNONAA >60 02/09/2021 1150   GFRAA >60 07/29/2019 0830   No results found for: "CHOL", "HDL", "LDLCALC", "LDLDIRECT", "TRIG" No results found for: "HGBA1C" No results found for: "VITAMINB12" Lab Results  Component Value Date   TSH 1.340 09/12/2018    CT Head 10/07/21 Normal head CT for age.  No explanation for patient's symptoms.   Routine EEG 03/24/22 Normal   ASSESSMENT AND PLAN  52 y.o. year old female  with anxiety, hypertension and seizure disorder who is presenting for follow-up.  Her routine EEG was normal.  At last visit we had  planned to start lamotrigine with the goal to keep her on lamotrigine monotherapy.  Currently she is on lamotrigine 100 mg daily instead of twice daily.  I have advised advised patient to increase the lamotrigine to 100 mg twice daily, to continue phenytoin 200 mg twice daily for few days and decrease it to 200 mg nightly for 1 week then further decrease to 100 mg nightly for for 1 week then to stop the medication.  I will check a lamotrigine level today with BMP. I did advise her to contact me if she has any concerns or any breakthrough seizure.  I will see her in 3 months for follow-up or sooner if worse.   1. Nonintractable epilepsy without status epilepticus, unspecified epilepsy type (HCC)   2. Therapeutic drug monitoring      Patient Instructions  Increase lamotrigine to 100 mg twice daily Continue with phenytoin 200 mg twice daily for a few days, then decrease phenytoin to 200 mg nightly for 1 week then further decrease to 100 mg nightly for 1 week then stop Will check lamotrigine level today with BMP Follow-up in 3 months or sooner if worse.    Per [facility] statutes, patients with seizures are not allowed to drive until they have been seizure-free for six months.  Other recommendations include using caution when using heavy equipment or power tools. Avoid working on ladders or at heights. Take showers instead of baths.  Do not swim alone.  Ensure the water temperature is not too high on the home water heater. Do not go swimming alone. Do not lock yourself in a room alone (i.e. bathroom). When caring for infants or small children, sit down when holding, feeding, or changing them to minimize risk of injury to the child in the event you have a seizure. Maintain good sleep hygiene. Avoid alcohol.  Also recommend adequate sleep, hydration, good diet and minimize stress.   During the Seizure  - First, ensure adequate ventilation and place patients on the floor on their left side   Loosen clothing around the neck and ensure the airway is patent. If the patient is clenching the teeth, do not force the mouth open with any object as this can cause severe damage - Remove all items from the surrounding that can be hazardous. The patient may be oblivious to what's happening and may not even know what he or she is doing. If the patient is confused and wandering, either gently guide him/her away and block access to outside areas - Reassure the individual and be comforting - Call 911. In most cases, the seizure ends before EMS arrives. However, there are cases when seizures may last over 3 to 5 minutes. Or the individual may have developed breathing difficulties or severe injuries. If a pregnant patient or a person with diabetes develops a seizure,  it is prudent to call an ambulance. - Finally, if the patient does not regain full consciousness, then call EMS. Most patients will remain confused for about 45 to 90 minutes after a seizure, so you must use judgment in calling for help. - Avoid restraints but make sure the patient is in a bed with padded side rails - Place the individual in a lateral position with the neck slightly flexed; this will help the saliva drain from the mouth and prevent the tongue from falling backward - Remove all nearby furniture and other hazards from the area - Provide verbal assurance as the individual is regaining consciousness - Provide the patient with privacy if possible - Call for help and start treatment as ordered by the caregiver   After the Seizure (Postictal Stage)  After a seizure, most patients experience confusion, fatigue, muscle pain and/or a headache. Thus, one should permit the individual to sleep. For the next few days, reassurance is essential. Being calm and helping reorient the person is also of importance.  Most seizures are painless and end spontaneously. Seizures are not harmful to others but can lead to complications such as stress  on the lungs, brain and the heart. Individuals with prior lung problems may develop labored breathing and respiratory distress.     Orders Placed This Encounter  Procedures   Lamotrigine level   Basic Metabolic Panel    Meds ordered this encounter  Medications   lamoTRIgine (LAMICTAL) 100 MG tablet    Sig: Take 1 tablet (100 mg total) by mouth 2 (two) times daily.    Dispense:  180 tablet    Refill:  3    Return in about 3 months (around 07/14/2022).    Alric Ran, MD 04/14/2022, 8:20 AM  [facility] [address], [place], [place] [address] [phone]

## 2022-04-16 LAB — LAMOTRIGINE LEVEL: Lamotrigine Lvl: <1 ug/mL — ABNORMAL LOW (ref 2.0–20.0)

## 2022-04-16 LAB — BASIC METABOLIC PANEL
BUN/Creatinine Ratio: 12 (ref 9–23)
BUN: 9 mg/dL (ref 6–24)
CO2: 23 mmol/L (ref 20–29)
Calcium: 9.5 mg/dL (ref 8.7–10.2)
Chloride: 102 mmol/L (ref 96–106)
Creatinine, Ser: 0.74 mg/dL (ref 0.57–1.00)
Glucose: 101 mg/dL — ABNORMAL HIGH (ref 70–99)
Potassium: 4.6 mmol/L (ref 3.5–5.2)
Sodium: 143 mmol/L (ref 134–144)
eGFR: 98 mL/min/{1.73_m2} (ref >59)

## 2022-04-21 DIAGNOSIS — R030 Elevated blood-pressure reading, without diagnosis of hypertension: Secondary | ICD-10-CM | POA: Diagnosis not present

## 2022-04-21 DIAGNOSIS — J019 Acute sinusitis, unspecified: Secondary | ICD-10-CM | POA: Diagnosis not present

## 2022-05-09 ENCOUNTER — Encounter: Payer: Self-pay | Admitting: Neurology

## 2022-05-10 NOTE — Telephone Encounter (Signed)
Pt called. Stated she took the [name] as instructed by Dr. April Manson. Stated her heart is racing and her chest is hurt. Stated blood pressure was 144/97 and heart race 129-139. Pt is requesting a call back.

## 2022-06-02 ENCOUNTER — Encounter: Payer: Self-pay | Admitting: Radiology

## 2022-06-18 DIAGNOSIS — R030 Elevated blood-pressure reading, without diagnosis of hypertension: Secondary | ICD-10-CM | POA: Diagnosis not present

## 2022-06-18 DIAGNOSIS — T148XXA Other injury of unspecified body region, initial encounter: Secondary | ICD-10-CM | POA: Diagnosis not present

## 2022-07-26 ENCOUNTER — Encounter: Payer: Self-pay | Admitting: Neurology

## 2022-07-26 ENCOUNTER — Ambulatory Visit (INDEPENDENT_AMBULATORY_CARE_PROVIDER_SITE_OTHER): Payer: 59 | Admitting: Neurology

## 2022-07-26 VITALS — BP 124/81 | HR 77 | Ht 66.0 in | Wt 179.5 lb

## 2022-07-26 DIAGNOSIS — Z5181 Encounter for therapeutic drug level monitoring: Secondary | ICD-10-CM | POA: Diagnosis not present

## 2022-07-26 DIAGNOSIS — G40909 Epilepsy, unspecified, not intractable, without status epilepticus: Secondary | ICD-10-CM | POA: Diagnosis not present

## 2022-07-26 MED ORDER — PREGABALIN 150 MG PO CAPS: 150.0000 mg | ORAL_CAPSULE | Freq: Every evening | ORAL | 5 refills | Status: DC

## 2022-07-26 NOTE — Patient Instructions (Signed)
Increase lamotrigine to 200 mg in the morning Restart pregabalin 150 mg nightly Will check lamotrigine level with BMP Continue your other medications Follow-up in 3 months or sooner if worse

## 2022-07-26 NOTE — Progress Notes (Signed)
[facility]  PATIENT: [name] DOB: 11-27-1970  REQUESTING CLINICIAN: Benita Stabile, MD HISTORY FROM: Patient and girlfriend  REASON FOR VISIT: Establish care for Epilepsy    HISTORICAL  CHIEF COMPLAINT:  Chief Complaint  Patient presents with   Follow-up    Rm 12, chastity present friend Seizures: last seizure last week pt stated that seizures unpredictable not really any noticeable changes   INTERVAL HISTORY 07/26/2022:  Patient presents today for follow-up, at last visit in January we have discontinued the phenytoin and increase the lamotrigine to 100 mg twice daily.  She could not tolerate the increase because she did have insomnia at night.  Her lamotrigine was decreased to 150 mg daily.  She reports that she was doing well until last week when she had a seizure at the doctor's office.  She was getting blood drawn, phlebotomist asked her to hold the cotton and applied pressure and when she looked at the blood she passed out.  EMS was not called.  She was observed at the doctor's office for about 30 minutes before going home.  She states she was tired for the next 3 days.  Other than that event she has been doing well.  She reports compliance with the medicine. She stated at night she still have difficulty sleeping on occasion due to restless leg and neuropathy. She was previously put on pregabalin 50 mg nightly but she is not sure of the indication.   INTERVAL HISTORY 04/14/2022:  Patient presents today for follow-up she is accompanied by her girlfriend.  At last visit plan was to start lamotrigine.  Currently she is on lamotrigine 100 mg daily instead of 100 mg twice daily.  She is still on the phenytoin 200 mg twice daily.  She tolerates the medication very well.  Denies any side effect from the medication.  Overall doing well, no complaints, no concerns.   HISTORY OF PRESENT ILLNESS:  This is a 52 year old woman with past medical history hypertension,  anxiety, seizure disorder who is presenting to establish care.  Patient reports history of seizures since childhood.  Seizure has been on and off per patient and she is currently on Dilantin 200 mg twice daily, reports being on Dilantin for many years.  She stated that she was on 1 medication in the past but does not remember the name.  She reports sometimes when she is in pain she will have seizures.  Her last seizure was associated with her twisting her left ankle, this was a month ago.  Prior to that her seizure was a month before and not triggered by pain.  She is compliant with the medication.  She denies any seizure risk factors and denies any major side effect with the Dilantin.  Her seizures are described as generalized convulsion followed by postictal confusion and tiredness.  Girlfriend reports that she sleeps the rest of the day after having a seizure.  No major injury associated with the seizures but she bit her tongue in the past and had urinary incontinence.   Handedness: Right handed   Onset: Since childhood   Seizure Type: Generalized convulsion   Current frequency: 1 a month but can go months without seizures   Any injuries from seizures: Tongue biting  Seizure risk factors: Denies   Previous ASMs: Dilantin and another one that she does not remember the name   Currenty ASMs: Lamotrigine   ASMs side effects: None   Brain Images: Normal head CT  Previous EEGs: None available for review    OTHER MEDICAL CONDITIONS: Hypertension, Anxiety, Seizure  REVIEW OF SYSTEMS: Full 14 system review of systems performed and negative with exception of: As noted in the HPI   ALLERGIES: Allergies  Allergen Reactions   Benadryl [Diphenhydramine Hcl] Rash    HOME MEDICATIONS: Outpatient Medications Prior to Visit  Medication Sig Dispense Refill   ALPRAZolam (XANAX) 0.5 MG tablet Take 0.5 mg by mouth 2 (two) times daily as needed for anxiety.   5   amLODipine (NORVASC) 5 MG tablet  SMARTSIG:1 Tablet(s) By Mouth Every Evening     DULoxetine (CYMBALTA) 30 MG capsule Take 30 mg by mouth daily.     estradiol (ESTRACE) 0.5 MG tablet Take 1 tablet (0.5 mg total) by mouth daily. 30 tablet 11   ezetimibe (ZETIA) 10 MG tablet Take 10 mg by mouth daily.     irbesartan (AVAPRO) 75 MG tablet Take 300 mg by mouth daily at 2 PM.  12   lamoTRIgine (LAMICTAL) 100 MG tablet Take 1 tablet (100 mg total) by mouth 2 (two) times daily. 180 tablet 3   LINZESS 145 MCG CAPS capsule TAKE ONE CAPSULE BY MOUTH BEFORE BREAKFAST 30 capsule 11   metoprolol succinate (TOPROL-XL) 25 MG 24 hr tablet Take 25 mg by mouth daily.     ondansetron (ZOFRAN) 4 MG tablet Take 1 tablet (4 mg total) by mouth every 6 (six) hours. (Patient taking differently: Take 4 mg by mouth every 8 (eight) hours as needed for nausea or vomiting.) 12 tablet 0   oxyCODONE-acetaminophen (PERCOCET/ROXICET) 5-325 MG tablet Take 1 tablet by mouth every 4 (four) hours as needed for severe pain (pain.). 30 tablet 0   pantoprazole (PROTONIX) 40 MG tablet Take 1 tablet (40 mg total) by mouth 2 (two) times daily. 180 tablet 3   progesterone (PROMETRIUM) 100 MG capsule Take 1 capsule (100 mg total) by mouth See admin instructions. Take 1 capsule (100 mg) by mouth daily x 14 days every 3 months 14 capsule 6   Vibegron (GEMTESA) 75 MG TABS Gemtesa 75 mg tablet  Take 1 tablet every day by oral route.     pregabalin (LYRICA) 50 MG capsule pregabalin 50 mg capsule     No facility-administered medications prior to visit.    PAST MEDICAL HISTORY: Past Medical History:  Diagnosis Date   Anxiety    Arthritis    Balance problems    Chronic abdominal pain    Chronic knee pain    Constipation    Difficulty walking    GERD (gastroesophageal reflux disease)    Headache    HTN (hypertension)    Low back pain    Panic attacks    Polyuria    Seizures    unknown etiology; on dilantin; last seizures was a few years ago.   Sleep apnea     PAST  SURGICAL HISTORY: Past Surgical History:  Procedure Laterality Date   BACK SURGERY     complicated by ureteral injury   CHONDROPLASTY Right 08/31/2016   Procedure: CHONDROPLASTY RIGHT FEMUR;  Surgeon: Vickki Hearing, MD;  Location: [facility];  Service: Orthopedics;  Laterality: Right;   COLONOSCOPY WITH PROPOFOL N/A 12/19/2019   Procedure: COLONOSCOPY WITH PROPOFOL;  Surgeon: Corbin Ade, Nonbleeding external and internal grade 3 hemorrhoids, three 2-14mm polyps, otherwise normal.  Suspected benign anorectal bleeding.  Pathology revealed hyperplastic polyps.  Repeat colonoscopy in 10 years.   ESOPHAGOGASTRODUODENOSCOPY N/A 09/11/2015   Procedure: ESOPHAGOGASTRODUODENOSCOPY (EGD);  Surgeon: Corbin Ade, MD;  Normal esophagus s/p dilation, few gastric polyps resected and retrieved, normal duodenum. Gastric polyps benign.    ESOPHAGOGASTRODUODENOSCOPY (EGD) WITH ESOPHAGEAL DILATION N/A 03/20/2013   Dr. Rourk:Normal EGD-status post passage of a Maloney dilator/Status post esophageal biopsy., benign path   ESOPHAGOGASTRODUODENOSCOPY (EGD) WITH PROPOFOL N/A 12/19/2019   Procedure: ESOPHAGOGASTRODUODENOSCOPY (EGD) WITH PROPOFOL;  Surgeon: Corbin Ade, MD;  Normal esophagus s/p dilation, benign-appearing fundic gland polyps, normal examined duodenum.   KNEE ARTHROSCOPY WITH MEDIAL MENISECTOMY Right 08/31/2016   Procedure: KNEE ARTHROSCOPY WITH MEDIAL MENISECTOMY;  Surgeon: Vickki Hearing, MD;  Location: [facility];  Service: Orthopedics;  Laterality: Right;   KNEE ARTHROSCOPY WITH MEDIAL MENISECTOMY Right 09/13/2018   Procedure: KNEE ARTHROSCOPY WITH MEDIAL MENISCECTOMY AND MICROFRACTURE;  Surgeon: Vickki Hearing, MD;  Location: [facility];  Service: Orthopedics;  Laterality: Right;   LUMBAR LAMINECTOMY/DECOMPRESSION MICRODISCECTOMY Left 02/12/2021   Procedure: Left Lumbar Five- Sacral One Microdiscectomy;  Surgeon: Tia Alert, MD;  Location[facility];  Service: Neurosurgery;  Laterality: Left;   Left Lumbar Five- Sacral One Microdiscectomy   MALONEY DILATION N/A 09/11/2015   Procedure: Elease Hashimoto DILATION;  Surgeon: Corbin Ade, MD;  Location: [facility];  Service: Endoscopy;  Laterality: N/A;   MALONEY DILATION N/A 12/19/2019   Procedure: Elease Hashimoto DILATION;  Surgeon: Corbin Ade, MD;  Location: [facility];  Service: Endoscopy;  Laterality: N/A;   POLYPECTOMY  12/19/2019   Procedure: POLYPECTOMY;  Surgeon: Corbin Ade, MD;  Location: [facility];  Service: Endoscopy;;    FAMILY HISTORY: Family History  Problem Relation Age of Onset   Throat cancer Father    Heart disease Father    Cancer Father    Hypertension Father    Diabetes Father    Other Mother        esophageal stricture and gallbladder disease   Thyroid disease Mother    Other Sister        esophageal stricture and gallbladder problems.    Diabetes Sister    Hypertension Sister    Thyroid disease Sister    Heart disease Maternal Grandfather    Diabetes Maternal Grandfather    Arthritis Maternal Grandfather    Colon cancer Neg Hx    Liver disease Neg Hx    Pancreatic cancer Neg Hx     SOCIAL HISTORY: Social History   Socioeconomic History   Marital status: Significant Other    Spouse name: Not on file   Number of children: 0   Years of education: 12   Highest education level: Not on file  Occupational History   Occupation: disability    Employer: DISABLED  Tobacco Use   Smoking status: Former    Packs/day: 2.00    Years: 9.00    Additional pack years: 0.00    Total pack years: 18.00    Types: Cigarettes    Quit date: 04/04/1994    Years since quitting: 28.3   Smokeless tobacco: Never   Tobacco comments:    smoked 8 years, quit 18 years ago in the 1996  Vaping Use   Vaping Use: Never used  Substance and Sexual Activity   Alcohol use: No    Alcohol/week: 0.0 standard drinks of alcohol   Drug use: No   Sexual activity: Not Currently    Birth control/protection: Post-menopausal   Other Topics Concern   Not on file  Social History Narrative   Not on file   Social Determinants of  Health   Financial Resource Strain: Low Risk  (08/20/2021)   Overall Financial Resource Strain (CARDIA)    Difficulty of Paying Living Expenses: Not hard at all  Food Insecurity: No Food Insecurity (08/20/2021)   Hunger Vital Sign    Worried About Running Out of Food in the Last Year: Never true    Ran Out of Food in the Last Year: Never true  Transportation Needs: No Transportation Needs (08/20/2021)   PRAPARE - Administrator, Civil Service (Medical): No    Lack of Transportation (Non-Medical): No  Physical Activity: Insufficiently Active (08/20/2021)   Exercise Vital Sign    Days of Exercise per Week: 3 days    Minutes of Exercise per Session: 30 min  Stress: No Stress Concern Present (08/20/2021)   [employer] of Occupational Health - Occupational Stress Questionnaire    Feeling of Stress : Only a little  Social Connections: Unknown (08/20/2021)   Social Connection and Isolation Panel [NHANES]    Frequency of Communication with Friends and Family: More than three times a week    Frequency of Social Gatherings with Friends and Family: More than three times a week    Attends Religious Services: 1 to 4 times per year    Active Member of [employer] or Organizations: No    Attends Banker Meetings: Never    Marital Status: Patient declined  Catering manager Violence: Not At Risk (08/20/2021)   Humiliation, Afraid, Rape, and Kick questionnaire    Fear of Current or Ex-Partner: No    Emotionally Abused: No    Physically Abused: No    Sexually Abused: No    PHYSICAL EXAM  GENERAL EXAM/CONSTITUTIONAL: Vitals:  Vitals:   07/26/22 1006  BP: 124/81  Pulse: 77  Weight: 179 lb 8 oz (81.4 kg)  Height: 5\' 6"  (1.676 m)   Body mass index is 28.97 kg/m. Wt Readings from Last 3 Encounters:  07/26/22 179 lb 8 oz (81.4 kg)  04/14/22 176 lb (79.8 kg)  02/17/22  176 lb (79.8 kg)   Patient is in no distress; well developed, nourished and groomed; neck is supple  EYES: Visual fields full to confrontation, Extraocular movements intacts,  No results found.  MUSCULOSKELETAL: Gait, strength, tone, movements noted in Neurologic exam below  NEUROLOGIC: MENTAL STATUS:      No data to display         awake, alert, oriented to person, place and time recent and remote memory intact normal attention and concentration language fluent, comprehension intact, naming intact fund of knowledge appropriate  CRANIAL NERVE:  2nd, 3rd, 4th, 6th - Visual fields full to confrontation, extraocular muscles intact, no nystagmus 5th - facial sensation symmetric 7th - facial strength symmetric 8th - hearing intact 9th - palate elevates symmetrically, uvula midline 11th - shoulder shrug symmetric 12th - tongue protrusion midline  MOTOR:  normal bulk and tone, full strength in the BUE, BLE  SENSORY:  normal and symmetric to light touch  COORDINATION:  finger-nose-finger, fine finger movements normal  GAIT/STATION:  normal     DIAGNOSTIC DATA (LABS, IMAGING, TESTING) - I reviewed patient records, labs, notes, testing and imaging myself where available.  Lab Results  Component Value Date   WBC 6.6 08/27/2021   HGB 13.4 08/27/2021   HCT 37.8 08/27/2021   MCV 89 08/27/2021   PLT 313 08/27/2021      Component Value Date/Time   NA 143 04/14/2022 0814   K 4.6 04/14/2022 1610  CL 102 04/14/2022 0814   CO2 23 04/14/2022 0814   GLUCOSE 101 (H) 04/14/2022 0814   GLUCOSE 89 02/09/2021 1150   BUN 9 04/14/2022 0814   CREATININE 0.74 04/14/2022 0814   CALCIUM 9.5 04/14/2022 0814   PROT 9.0 (H) 12/26/2017 1753   ALBUMIN 4.7 12/26/2017 1753   AST 20 12/26/2017 1753   AST 15 10/19/2013 0000   ALT 18 12/26/2017 1753   ALKPHOS 86 12/26/2017 1753   ALKPHOS 73 10/19/2013 0000   BILITOT 0.6 12/26/2017 1753   BILITOT 0.3 10/19/2013 0000   GFRNONAA  >60 02/09/2021 1150   GFRAA >60 07/29/2019 0830   No results found for: "CHOL", "HDL", "LDLCALC", "LDLDIRECT", "TRIG" No results found for: "HGBA1C" No results found for: "VITAMINB12" Lab Results  Component Value Date   TSH 1.340 09/12/2018    CT Head 10/07/21 Normal head CT for age.  No explanation for patient's symptoms.   Routine EEG 03/24/22 Normal   ASSESSMENT AND PLAN  52 y.o. year old female  with anxiety, hypertension and seizure disorder who is presenting for follow-up.  She is currently on lamotrigine 150 mg daily, she could not tolerate BID dosing due to insomnia. Overall, she has been doing well until last week when she had a seizure versus syncope at the doctor's following blood drawn.  Plan for patient is to increase the lamotrigine to 200 mg in the morning and to restart pregabalin 150 mg at night due to her restless leg syndrome and neuropathic pain.  I will also check her Lamictal and BMP today. She voiced understanding.  I will see her in 3 months for follow-up.   1. Nonintractable epilepsy without status epilepticus, unspecified epilepsy type   2. Therapeutic drug monitoring     Patient Instructions  Increase lamotrigine to 200 mg in the morning Restart pregabalin 150 mg nightly Will check lamotrigine level with BMP Continue your other medications Follow-up in 3 months or sooner if worse   Per [facility] statutes, patients with seizures are not allowed to drive until they have been seizure-free for six months.  Other recommendations include using caution when using heavy equipment or power tools. Avoid working on ladders or at heights. Take showers instead of baths.  Do not swim alone.  Ensure the water temperature is not too high on the home water heater. Do not go swimming alone. Do not lock yourself in a room alone (i.e. bathroom). When caring for infants or small children, sit down when holding, feeding, or changing them to minimize risk of injury to the  child in the event you have a seizure. Maintain good sleep hygiene. Avoid alcohol.  Also recommend adequate sleep, hydration, good diet and minimize stress.   During the Seizure  - First, ensure adequate ventilation and place patients on the floor on their left side  Loosen clothing around the neck and ensure the airway is patent. If the patient is clenching the teeth, do not force the mouth open with any object as this can cause severe damage - Remove all items from the surrounding that can be hazardous. The patient may be oblivious to what's happening and may not even know what he or she is doing. If the patient is confused and wandering, either gently guide him/her away and block access to outside areas - Reassure the individual and be comforting - Call 911. In most cases, the seizure ends before EMS arrives. However, there are cases when seizures may last over 3 to  5 minutes. Or the individual may have developed breathing difficulties or severe injuries. If a pregnant patient or a person with diabetes develops a seizure, it is prudent to call an ambulance. - Finally, if the patient does not regain full consciousness, then call EMS. Most patients will remain confused for about 45 to 90 minutes after a seizure, so you must use judgment in calling for help. - Avoid restraints but make sure the patient is in a bed with padded side rails - Place the individual in a lateral position with the neck slightly flexed; this will help the saliva drain from the mouth and prevent the tongue from falling backward - Remove all nearby furniture and other hazards from the area - Provide verbal assurance as the individual is regaining consciousness - Provide the patient with privacy if possible - Call for help and start treatment as ordered by the caregiver   After the Seizure (Postictal Stage)  After a seizure, most patients experience confusion, fatigue, muscle pain and/or a headache. Thus, one should permit  the individual to sleep. For the next few days, reassurance is essential. Being calm and helping reorient the person is also of importance.  Most seizures are painless and end spontaneously. Seizures are not harmful to others but can lead to complications such as stress on the lungs, brain and the heart. Individuals with prior lung problems may develop labored breathing and respiratory distress.     Orders Placed This Encounter  Procedures   Lamotrigine level   Basic Metabolic Panel    Meds ordered this encounter  Medications   pregabalin (LYRICA) 150 MG capsule    Sig: Take 1 capsule (150 mg total) by mouth at bedtime.    Dispense:  30 capsule    Refill:  5    Return in about 6 months (around 01/25/2023).    Windell Norfolk, MD 07/26/2022, 11:45 AM  [facility] [address], [address] [place], [place] [address] [phone]

## 2022-07-28 DIAGNOSIS — R569 Unspecified convulsions: Secondary | ICD-10-CM | POA: Diagnosis not present

## 2022-07-28 DIAGNOSIS — N3281 Overactive bladder: Secondary | ICD-10-CM | POA: Diagnosis not present

## 2022-07-28 DIAGNOSIS — R000 Tachycardia, unspecified: Secondary | ICD-10-CM | POA: Diagnosis not present

## 2022-07-28 DIAGNOSIS — M79644 Pain in right finger(s): Secondary | ICD-10-CM | POA: Diagnosis not present

## 2022-07-28 DIAGNOSIS — G8929 Other chronic pain: Secondary | ICD-10-CM | POA: Diagnosis not present

## 2022-07-28 DIAGNOSIS — I10 Essential (primary) hypertension: Secondary | ICD-10-CM | POA: Diagnosis not present

## 2022-07-28 DIAGNOSIS — W57XXXD Bitten or stung by nonvenomous insect and other nonvenomous arthropods, subsequent encounter: Secondary | ICD-10-CM | POA: Diagnosis not present

## 2022-07-28 DIAGNOSIS — K219 Gastro-esophageal reflux disease without esophagitis: Secondary | ICD-10-CM | POA: Diagnosis not present

## 2022-07-28 LAB — BASIC METABOLIC PANEL
BUN/Creatinine Ratio: 11 (ref 9–23)
BUN: 9 mg/dL (ref 6–24)
CO2: 23 mmol/L (ref 20–29)
Calcium: 9.7 mg/dL (ref 8.7–10.2)
Chloride: 100 mmol/L (ref 96–106)
Creatinine, Ser: 0.83 mg/dL (ref 0.57–1.00)
Glucose: 83 mg/dL (ref 70–99)
Potassium: 4.1 mmol/L (ref 3.5–5.2)
Sodium: 139 mmol/L (ref 134–144)
eGFR: 85 mL/min/{1.73_m2} (ref >59)

## 2022-07-28 LAB — LAMOTRIGINE LEVEL: Lamotrigine Lvl: 3.5 ug/mL (ref 2.0–20.0)

## 2022-08-26 DIAGNOSIS — R7303 Prediabetes: Secondary | ICD-10-CM | POA: Diagnosis not present

## 2022-08-26 DIAGNOSIS — E782 Mixed hyperlipidemia: Secondary | ICD-10-CM | POA: Diagnosis not present

## 2022-08-30 DIAGNOSIS — Z0000 Encounter for general adult medical examination without abnormal findings: Secondary | ICD-10-CM | POA: Diagnosis not present

## 2022-08-31 ENCOUNTER — Other Ambulatory Visit: Payer: Self-pay | Admitting: Adult Health

## 2022-08-31 DIAGNOSIS — K219 Gastro-esophageal reflux disease without esophagitis: Secondary | ICD-10-CM | POA: Diagnosis not present

## 2022-08-31 DIAGNOSIS — I10 Essential (primary) hypertension: Secondary | ICD-10-CM | POA: Diagnosis not present

## 2022-08-31 DIAGNOSIS — L570 Actinic keratosis: Secondary | ICD-10-CM | POA: Insufficient documentation

## 2022-08-31 DIAGNOSIS — G8929 Other chronic pain: Secondary | ICD-10-CM | POA: Diagnosis not present

## 2022-08-31 DIAGNOSIS — R000 Tachycardia, unspecified: Secondary | ICD-10-CM | POA: Diagnosis not present

## 2022-08-31 DIAGNOSIS — R7303 Prediabetes: Secondary | ICD-10-CM | POA: Diagnosis not present

## 2022-08-31 DIAGNOSIS — E782 Mixed hyperlipidemia: Secondary | ICD-10-CM | POA: Diagnosis not present

## 2022-08-31 DIAGNOSIS — N3281 Overactive bladder: Secondary | ICD-10-CM | POA: Diagnosis not present

## 2022-08-31 DIAGNOSIS — M79644 Pain in right finger(s): Secondary | ICD-10-CM | POA: Diagnosis not present

## 2022-09-15 ENCOUNTER — Encounter: Payer: Self-pay | Admitting: Internal Medicine

## 2022-10-20 DIAGNOSIS — X32XXXA Exposure to sunlight, initial encounter: Secondary | ICD-10-CM | POA: Diagnosis not present

## 2022-10-20 DIAGNOSIS — L570 Actinic keratosis: Secondary | ICD-10-CM | POA: Diagnosis not present

## 2022-11-15 ENCOUNTER — Ambulatory Visit
Admission: EM | Admit: 2022-11-15 | Discharge: 2022-11-15 | Disposition: A | Discharge status: Home / Self Care | Payer: 59 | Attending: Nurse Practitioner | Admitting: Nurse Practitioner

## 2022-11-15 ENCOUNTER — Encounter: Payer: Self-pay | Admitting: Emergency Medicine

## 2022-11-15 DIAGNOSIS — Z1152 Encounter for screening for COVID-19: Secondary | ICD-10-CM | POA: Diagnosis not present

## 2022-11-15 DIAGNOSIS — J029 Acute pharyngitis, unspecified: Secondary | ICD-10-CM | POA: Insufficient documentation

## 2022-11-15 DIAGNOSIS — H1033 Unspecified acute conjunctivitis, bilateral: Secondary | ICD-10-CM | POA: Diagnosis not present

## 2022-11-15 LAB — POCT RAPID STREP A (OFFICE): Rapid Strep A Screen: NEGATIVE

## 2022-11-15 MED ORDER — POLYMYXIN B-TRIMETHOPRIM 10000-0.1 UNIT/ML-% OP SOLN: 1.0000 [drp] | Freq: Four times a day (QID) | OPHTHALMIC | 0 refills | Status: AC

## 2022-11-15 MED ORDER — FLUTICASONE PROPIONATE 50 MCG/ACT NA SUSP: 2.0000 | Freq: Every day | NASAL | 0 refills | Status: DC

## 2022-11-15 NOTE — ED Triage Notes (Signed)
Sore throat x 3 days.  States eyes were matted shut with green drainage x 2 days.

## 2022-11-15 NOTE — ED Provider Notes (Signed)
[facility]    CSN: [id number] Arrival date & time: 11/15/22  0915      History   Chief Complaint No chief complaint on file.   HPI [name] is a 52 y.o. female.   The history is provided by the patient.   The patient presents with a 3-day history of sore throat and eye redness and drainage.  Patient states that her throat pain is worse at night.  She also states that the eye drainage was green, and she woke up with her eyes matted shut this morning.  Patient states symptoms started in both eyes at the same time.  She denies fever, chills, headache, ear pain, ear drainage, nasal congestion, runny nose, cough, abdominal pain, decreased vision, eye swelling, or light sensitivity.  Patient does not wear contacts or eyeglasses.  Past Medical History:  Diagnosis Date   Anxiety    Arthritis    Balance problems    Chronic abdominal pain    Chronic knee pain    Constipation    Difficulty walking    GERD (gastroesophageal reflux disease)    Headache    HTN (hypertension)    Low back pain    Panic attacks    Polyuria    Seizures (HCC)    unknown etiology; on dilantin; last seizures was a few years ago.   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    Derangement of posterior horn of medial meniscus of right knee    Primary osteoarthritis of right knee    Gastric polyp    Gastroesophageal reflux disease 08/24/2015   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   Abdominal pain, chronic, epigastric 12/08/2013   Constipation 12/08/2013   Esophageal dysphagia 02/22/2013   Abdominal pain, epigastric 02/22/2013   LUQ pain 02/22/2013   Difficulty in walking(719.7) 01/25/2012   Stiffness of joint, not elsewhere classified, ankle and foot 01/25/2012   Balance problems 01/25/2012   Ankle sprain 01/17/2012   Ankle pain, right 01/17/2012    Past Surgical History:  Procedure Laterality Date   BACK SURGERY     complicated by ureteral injury   CHONDROPLASTY Right 08/31/2016   Procedure: CHONDROPLASTY RIGHT FEMUR;  Surgeon: Vickki Hearing, MD;  Location: [facility];  Service: Orthopedics;  Laterality: Right;   COLONOSCOPY WITH PROPOFOL N/A 12/19/2019   Procedure: COLONOSCOPY WITH PROPOFOL;  Surgeon: Corbin Ade, Nonbleeding external and internal grade 3 hemorrhoids, three 2-37mm polyps, otherwise normal.  Suspected benign anorectal bleeding.  Pathology revealed hyperplastic polyps.  Repeat colonoscopy in 10 years.   ESOPHAGOGASTRODUODENOSCOPY N/A 09/11/2015   Procedure: ESOPHAGOGASTRODUODENOSCOPY (EGD);  Surgeon: Corbin Ade, MD;  Normal esophagus s/p dilation, few gastric polyps resected and retrieved, normal duodenum. Gastric polyps benign.    ESOPHAGOGASTRODUODENOSCOPY (EGD) WITH ESOPHAGEAL DILATION N/A 03/20/2013   Dr. Rourk:Normal EGD-status post passage of a Maloney dilator/Status post esophageal biopsy., benign path   ESOPHAGOGASTRODUODENOSCOPY (EGD) WITH PROPOFOL N/A 12/19/2019   Procedure: ESOPHAGOGASTRODUODENOSCOPY (  EGD) WITH PROPOFOL;  Surgeon: Corbin Ade, MD;  Normal esophagus s/p dilation, benign-appearing fundic gland polyps, normal examined duodenum.   KNEE ARTHROSCOPY WITH MEDIAL MENISECTOMY Right 08/31/2016   Procedure: KNEE ARTHROSCOPY WITH MEDIAL MENISECTOMY;  Surgeon: Vickki Hearing, MD;  Location: [facility];  Service: Orthopedics;  Laterality: Right;   KNEE ARTHROSCOPY WITH MEDIAL MENISECTOMY  Right 09/13/2018   Procedure: KNEE ARTHROSCOPY WITH MEDIAL MENISCECTOMY AND MICROFRACTURE;  Surgeon: Vickki Hearing, MD;  Location: [facility];  Service: Orthopedics;  Laterality: Right;   LUMBAR LAMINECTOMY/DECOMPRESSION MICRODISCECTOMY Left 02/12/2021   Procedure: Left Lumbar Five- Sacral One Microdiscectomy;  Surgeon: Tia Alert, MD;  Location: [facility];  Service: Neurosurgery;  Laterality: Left;  Left Lumbar Five- Sacral One Microdiscectomy   MALONEY DILATION N/A 09/11/2015   Procedure: Elease Hashimoto DILATION;  Surgeon: Corbin Ade, MD;  Location: [facility];  Service: Endoscopy;  Laterality: N/A;   MALONEY DILATION N/A 12/19/2019   Procedure: Elease Hashimoto DILATION;  Surgeon: Corbin Ade, MD;  Location: [facility];  Service: Endoscopy;  Laterality: N/A;   POLYPECTOMY  12/19/2019   Procedure: POLYPECTOMY;  Surgeon: Corbin Ade, MD;  Location: [facility];  Service: Endoscopy;;    OB History     Gravida  1   Para      Term      Preterm      AB  1   Living         SAB  1   IAB      Ectopic      Multiple      Live Births               Home Medications    Prior to Admission medications   Medication Sig Start Date End Date Taking? Authorizing Provider  fluticasone (FLONASE) 50 MCG/ACT nasal spray Place 2 sprays into both nostrils daily. 11/15/22  Yes -Warren, Sadie Haber, NP  trimethoprim-polymyxin b (POLYTRIM) ophthalmic solution Place 1 drop into both eyes every 6 (six) hours for 7 days. 11/15/22 11/22/22 Yes -Warren, Sadie Haber, NP  ALPRAZolam Prudy Feeler) 0.5 MG tablet Take 0.5 mg by mouth 2 (two) times daily as needed for anxiety.  04/30/14   [provider]  amLODipine (NORVASC) 5 MG tablet SMARTSIG:1 Tablet(s) By Mouth Every Evening 07/15/21   [provider]  DULoxetine (CYMBALTA) 30 MG capsule Take 30 mg by mouth daily. 08/21/19   [provider]  estradiol (ESTRACE) 0.5 MG tablet TAKE ONE TABLET BY MOUTH BEFORE BREAKFAST  08/31/22   Cyril Mourning A, NP  ezetimibe (ZETIA) 10 MG tablet Take 10 mg by mouth daily. 02/20/20   [provider]  irbesartan (AVAPRO) 75 MG tablet Take 300 mg by mouth daily at 2 PM. 07/03/14   [provider]  lamoTRIgine (LAMICTAL) 100 MG tablet Take 1 tablet (100 mg total) by mouth 2 (two) times daily. 04/14/22 04/09/23  Windell Norfolk, MD  LINZESS 145 MCG CAPS capsule TAKE ONE CAPSULE BY MOUTH BEFORE BREAKFAST 12/31/21   Letta Median, PA-C  metoprolol succinate (TOPROL-XL) 25 MG 24 hr tablet Take 25 mg by mouth daily. 09/08/20   [provider]  ondansetron (ZOFRAN) 4 MG tablet Take 1 tablet (4 mg total) by mouth every 6 (six) hours. Patient taking differently: Take 4 mg by mouth every 8 (eight) hours as needed for nausea or vomiting. 11/10/19   Wurst, [place], PA-C  oxyCODONE-acetaminophen (PERCOCET/ROXICET) 5-325 MG tablet Take 1 tablet by mouth  every 4 (four) hours as needed for severe pain (pain.). 02/12/21   Arman Bogus, MD  pantoprazole (PROTONIX) 40 MG tablet Take 1 tablet (40 mg total) by mouth 2 (two) times daily. 03/19/20   Letta Median, PA-C  pregabalin (LYRICA) 150 MG capsule Take 1 capsule (150 mg total) by mouth at bedtime. 07/26/22 02/21/23  Windell Norfolk, MD  progesterone (PROMETRIUM) 100 MG capsule TAKE 1 CAPSULE BY MOUTH EVERY DAY FOR 14 DAYS EVERY 3 MONTHS 08/31/22   Adline Potter, NP  Vibegron (GEMTESA) 75 MG TABS Gemtesa 75 mg tablet  Take 1 tablet every day by oral route.    [provider]    Family History Family History  Problem Relation Age of Onset   Throat cancer Father    Heart disease Father    Cancer Father    Hypertension Father    Diabetes Father    Other Mother        esophageal stricture and gallbladder disease   Thyroid disease Mother    Other Sister        esophageal stricture and gallbladder problems.    Diabetes Sister    Hypertension Sister    Thyroid disease Sister    Heart disease  Maternal Grandfather    Diabetes Maternal Grandfather    Arthritis Maternal Grandfather    Colon cancer Neg Hx    Liver disease Neg Hx    Pancreatic cancer Neg Hx     Social History Social History   Tobacco Use   Smoking status: Former    Current packs/day: 0.00    Average packs/day: 2.0 packs/day for 9.0 years (18.0 ttl pk-yrs)    Types: Cigarettes    Start date: 04/04/1985    Quit date: 04/04/1994    Years since quitting: 28.6   Smokeless tobacco: Never   Tobacco comments:    smoked 8 years, quit 18 years ago in the 1996  Vaping Use   Vaping status: Never Used  Substance Use Topics   Alcohol use: No    Alcohol/week: 0.0 standard drinks of alcohol   Drug use: No     Allergies   Benadryl [diphenhydramine hcl]   Review of Systems Review of Systems Per HPI  Physical Exam Triage Vital Signs ED Triage Vitals  Encounter Vitals Group     BP 11/15/22 0921 135/88     Systolic BP Percentile --      Diastolic BP Percentile --      Pulse Rate 11/15/22 0921 85     Resp 11/15/22 0921 16     Temp 11/15/22 0921 97.9 F (36.6 C)     Temp Source 11/15/22 0921 Oral     SpO2 11/15/22 0921 94 %     Weight --      Height --      Head Circumference --      Peak Flow --      Pain Score 11/15/22 0923 7     Pain Loc --      Pain Education --      Exclude from Growth Chart --    No data found.  Updated Vital Signs BP 135/88 (BP Location: Right Arm)   Pulse 85   Temp 97.9 F (36.6 C) (Oral)   Resp 16   LMP 07/06/2018   SpO2 94%   Visual Acuity Right Eye Distance:   Left Eye Distance:   Bilateral Distance:    Right Eye Near:   Left Eye Near:  Bilateral Near:     Physical Exam Vitals and nursing note reviewed.  Constitutional:      General: She is not in acute distress.    Appearance: Normal appearance.  HENT:     Head: Normocephalic.     Right Ear: Tympanic membrane, ear canal and external ear normal.     Left Ear: Tympanic membrane, ear canal and external  ear normal.     Nose: Nose normal.     Mouth/Throat:     Mouth: Mucous membranes are moist.     Pharynx: Uvula midline. Posterior oropharyngeal erythema and postnasal drip present. No pharyngeal swelling, oropharyngeal exudate or uvula swelling.  Eyes:     General: Lids are normal. Vision grossly intact. No visual field deficit.       Right eye: No foreign body, discharge or hordeolum.        Left eye: No foreign body, discharge or hordeolum.     Extraocular Movements: Extraocular movements intact.     Right eye: Normal extraocular motion and no nystagmus.     Left eye: Normal extraocular motion and no nystagmus.     Conjunctiva/sclera:     Right eye: Right conjunctiva is injected. No chemosis, exudate or hemorrhage.    Left eye: Left conjunctiva is injected. No chemosis, exudate or hemorrhage.    Pupils: Pupils are equal, round, and reactive to light.  Cardiovascular:     Rate and Rhythm: Normal rate and regular rhythm.     Pulses: Normal pulses.     Heart sounds: Normal heart sounds.  Pulmonary:     Effort: Pulmonary effort is normal. No respiratory distress.     Breath sounds: Normal breath sounds. No stridor. No wheezing, rhonchi or rales.  Abdominal:     General: Bowel sounds are normal.     Palpations: Abdomen is soft.     Tenderness: There is no abdominal tenderness.  Musculoskeletal:     Cervical back: Normal range of motion.  Lymphadenopathy:     Cervical: No cervical adenopathy.  Skin:    General: Skin is warm and dry.  Neurological:     General: No focal deficit present.     Mental Status: She is alert and oriented to person, place, and time.  Psychiatric:        Mood and Affect: Mood normal.        Behavior: Behavior normal.      UC Treatments / Results  Labs (all labs ordered are listed, but only abnormal results are displayed) Labs Reviewed  CULTURE, GROUP A STREP ([facility])  SARS CORONAVIRUS 2 (TAT 6-24 HRS)  POCT RAPID STREP A (OFFICE)     EKG   Radiology No results found.  Procedures Procedures (including critical care time)  Medications Ordered in UC Medications - No data to display  Initial Impression / Assessment and Plan / UC Course  I have reviewed the triage vital signs and the nursing notes.  Pertinent labs & imaging results that were available during my care of the patient were reviewed by me and considered in my medical decision making (see chart for details).  The patient is well-appearing, she is in no acute distress, vital signs are stable.  Rapid strep test was negative, throat culture and COVID test are pending.  Patient is able to receive Paxlovid if her COVID test is positive.  Suspect throat pain is over the result of postnasal drip seen on exam.  With regard to her eyes, symptoms started approximately 2  days ago, patient woke up with green discharge from her eyes and matting over the past 2 days.  Symptoms most likely are of viral etiology; however, given the increased contagion, will treat empirically with polymyxin eyedrops.  Supportive care recommendations were provided and discussed with the patient to include over-the-counter analgesics for pain or discomfort, warm salt water gargles, cool compresses to the eyes to help with swelling, and strict hand hygiene.  Patient was advised she will be contacted if the pending test results are abnormal.  Patient is in agreement with this plan of care and verbalizes understanding.  All questions were answered.  Patient stable for discharge.  Final Clinical Impressions(s) / UC Diagnoses   Final diagnoses:  Acute pharyngitis, unspecified etiology  Acute conjunctivitis of both eyes, unspecified acute conjunctivitis type  Encounter for screening for COVID-19     Discharge Instructions      For your eyes: Use eyedrops as prescribed.   Cool compresses to the eyes to help with pain or swelling.  May use warm compresses for pain or discomfort. eyedrops you  are using to help keep the eyes moist and decreased redness. Strict handwashing when applying medication.  Avoid rubbing or manipulating the eyes while symptoms persist.  For your sore throat: Rapid strep test is negative, COVID test and throat culture are pending.  You will be contacted if the pending test results are abnormal to provide treatment. Take medication as prescribed. Increase fluids and allow for plenty of rest. Recommend Tylenol or ibuprofen as needed for pain, fever, or general discomfort. Recommend throat lozenges, Chloraseptic or honey to help with throat pain. Warm salt water gargles 3-4 times daily to help with throat pain or discomfort. Recommend a diet with soft foods to include soups, broths, puddings, yogurt, Jell-O's, or popsicles until symptoms improve.  If symptoms are not improving over the next 7 to 10 days, or suddenly worsen, please follow-up in this clinic or with your primary care physician for further evaluation. Follow-up as needed.     ED Prescriptions     Medication Sig Dispense Auth. Provider   fluticasone (FLONASE) 50 MCG/ACT nasal spray Place 2 sprays into both nostrils daily. 16 g -Warren, Sadie Haber, NP   trimethoprim-polymyxin b (POLYTRIM) ophthalmic solution Place 1 drop into both eyes every 6 (six) hours for 7 days. 10 mL -Warren, Sadie Haber, NP      PDMP not reviewed this encounter.   Abran Cantor, NP 11/15/22 1010

## 2022-11-15 NOTE — Discharge Instructions (Addendum)
For your eyes: Use eyedrops as prescribed.   Cool compresses to the eyes to help with pain or swelling.  May use warm compresses for pain or discomfort. eyedrops you are using to help keep the eyes moist and decreased redness. Strict handwashing when applying medication.  Avoid rubbing or manipulating the eyes while symptoms persist.  For your sore throat: Rapid strep test is negative, COVID test and throat culture are pending.  You will be contacted if the pending test results are abnormal to provide treatment. Take medication as prescribed. Increase fluids and allow for plenty of rest. Recommend Tylenol or ibuprofen as needed for pain, fever, or general discomfort. Recommend throat lozenges, Chloraseptic or honey to help with throat pain. Warm salt water gargles 3-4 times daily to help with throat pain or discomfort. Recommend a diet with soft foods to include soups, broths, puddings, yogurt, Jell-O's, or popsicles until symptoms improve.  If symptoms are not improving over the next 7 to 10 days, or suddenly worsen, please follow-up in this clinic or with your primary care physician for further evaluation. Follow-up as needed.

## 2022-11-20 ENCOUNTER — Ambulatory Visit
Admission: EM | Admit: 2022-11-20 | Discharge: 2022-11-20 | Disposition: A | Discharge status: Home / Self Care | Payer: 59 | Attending: Nurse Practitioner | Admitting: Nurse Practitioner

## 2022-11-20 DIAGNOSIS — J029 Acute pharyngitis, unspecified: Secondary | ICD-10-CM | POA: Diagnosis not present

## 2022-11-20 MED ORDER — LIDOCAINE VISCOUS HCL 2 % MT SOLN: OROMUCOSAL | 0 refills | Status: DC

## 2022-11-20 MED ORDER — AMOXICILLIN-POT CLAVULANATE 875-125 MG PO TABS: 1.0000 | ORAL_TABLET | Freq: Two times a day (BID) | ORAL | 0 refills | Status: DC

## 2022-11-20 MED ORDER — PREDNISONE 20 MG PO TABS: 20.0000 mg | ORAL_TABLET | Freq: Every day | ORAL | 0 refills | Status: AC

## 2022-11-20 NOTE — ED Triage Notes (Signed)
Pt reports she has a sore throat x 8 days.

## 2022-11-20 NOTE — ED Provider Notes (Signed)
[facility]    CSN: [id number] Arrival date & time: 11/20/22  1431      History   Chief Complaint No chief complaint on file.   HPI [name] is a 52 y.o. female.   The history is provided by the patient.   The patient presents for follow-up for continued throat pain.  Patient was seen in this clinic on 11/15/2022.  COVID test and throat culture were performed, both were negative.  She states that she now has developed a cough.  She continues to deny fever, chills, headache, ear pain, nasal congestion, runny nose, abdominal pain, nausea, vomiting, or diarrhea.  Patient states that it is difficult for her to lay flat at night and sleep, she also reports that she has worsening pain with swallowing and eating.  She reports that none of the medications prescribed have helped, she also reports that she has not found any relief with Tylenol or ibuprofen.  Past Medical History:  Diagnosis Date   Anxiety    Arthritis    Balance problems    Chronic abdominal pain    Chronic knee pain    Constipation    Difficulty walking    GERD (gastroesophageal reflux disease)    Headache    HTN (hypertension)    Low back pain    Panic attacks    Polyuria    Seizures (HCC)    unknown etiology; on dilantin; last seizures was a few years ago.   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    Derangement of posterior horn of medial meniscus of right  knee    Primary osteoarthritis of right knee    Gastric polyp    Gastroesophageal reflux disease 08/24/2015   Hypoactive sexual desire disorder 05/14/2014   Abdominal pain, chronic, epigastric 12/08/2013   Constipation 12/08/2013   Esophageal dysphagia 02/22/2013   Abdominal pain, epigastric 02/22/2013   LUQ pain 02/22/2013   Difficulty in walking(719.7) 01/25/2012   Stiffness of joint, not elsewhere classified, ankle and foot 01/25/2012   Balance problems 01/25/2012   Ankle sprain 01/17/2012   Ankle pain, right 01/17/2012    Past Surgical History:  Procedure Laterality Date   BACK SURGERY     complicated by ureteral injury   CHONDROPLASTY Right 08/31/2016   Procedure: CHONDROPLASTY RIGHT FEMUR;  Surgeon: Vickki Hearing, MD;  Location: [facility];  Service: Orthopedics;  Laterality: Right;   COLONOSCOPY WITH PROPOFOL N/A 12/19/2019   Procedure: COLONOSCOPY WITH PROPOFOL;  Surgeon: Corbin Ade, Nonbleeding external and internal grade 3 hemorrhoids, three 2-35mm polyps, otherwise normal.  Suspected benign anorectal bleeding.  Pathology revealed hyperplastic polyps.  Repeat colonoscopy in 10 years.   ESOPHAGOGASTRODUODENOSCOPY N/A 09/11/2015   Procedure: ESOPHAGOGASTRODUODENOSCOPY (EGD);  Surgeon: Corbin Ade, MD;  Normal esophagus s/p dilation, few gastric polyps resected and retrieved, normal duodenum. Gastric polyps benign.    ESOPHAGOGASTRODUODENOSCOPY (EGD) WITH ESOPHAGEAL DILATION N/A 03/20/2013   Dr. Rourk:Normal EGD-status post passage  of a Maloney dilator/Status post esophageal biopsy., benign path   ESOPHAGOGASTRODUODENOSCOPY (EGD) WITH PROPOFOL N/A 12/19/2019   Procedure: ESOPHAGOGASTRODUODENOSCOPY (EGD) WITH PROPOFOL;  Surgeon: Corbin Ade, MD;  Normal esophagus s/p dilation, benign-appearing fundic gland polyps, normal examined duodenum.   KNEE ARTHROSCOPY WITH MEDIAL MENISECTOMY Right 08/31/2016   Procedure: KNEE ARTHROSCOPY WITH MEDIAL MENISECTOMY;  Surgeon: Vickki Hearing, MD;  Location: [facility];  Service: Orthopedics;  Laterality: Right;   KNEE ARTHROSCOPY WITH MEDIAL MENISECTOMY Right 09/13/2018   Procedure: KNEE ARTHROSCOPY WITH MEDIAL MENISCECTOMY AND MICROFRACTURE;  Surgeon: Vickki Hearing, MD;  Location: [facility];  Service: Orthopedics;  Laterality: Right;   LUMBAR LAMINECTOMY/DECOMPRESSION MICRODISCECTOMY Left 02/12/2021   Procedure: Left Lumbar Five- Sacral One Microdiscectomy;  Surgeon: Tia Alert, MD;  Location: [facility];  Service: Neurosurgery;  Laterality: Left;  Left Lumbar Five- Sacral One Microdiscectomy   MALONEY DILATION N/A 09/11/2015   Procedure: Elease Hashimoto DILATION;  Surgeon: Corbin Ade, MD;  Location: [facility];  Service: Endoscopy;  Laterality: N/A;   MALONEY DILATION N/A 12/19/2019   Procedure: Elease Hashimoto DILATION;  Surgeon: Corbin Ade, MD;  Location: [facility];  Service: Endoscopy;  Laterality: N/A;   POLYPECTOMY  12/19/2019   Procedure: POLYPECTOMY;  Surgeon: Corbin Ade, MD;  Location: [facility];  Service: Endoscopy;;    OB History     Gravida  1   Para      Term      Preterm      AB  1   Living         SAB  1   IAB      Ectopic      Multiple      Live Births               Home Medications    Prior to Admission medications   Medication Sig Start Date End Date Taking? Authorizing Provider  amoxicillin-clavulanate (AUGMENTIN) 875-125 MG tablet Take 1 tablet by mouth every 12 (twelve) hours. 11/20/22  Yes Marcille Barman-Warren, Sadie Haber, NP  lidocaine (XYLOCAINE) 2 % solution Gargle and spit 5 mL every 6 hours as needed for throat pain or discomfort. 11/20/22  Yes Zacchary Pompei-Warren, Sadie Haber, NP  predniSONE (DELTASONE) 20 MG tablet Take 1 tablet (20 mg total) by mouth daily with breakfast for 5 days. 11/20/22 11/25/22 Yes Khalon Cansler-Warren, Sadie Haber, NP  ALPRAZolam Prudy Feeler) 0.5 MG tablet Take 0.5 mg by mouth 2 (two) times daily as needed for anxiety.  04/30/14   [provider]  amLODipine  (NORVASC) 5 MG tablet SMARTSIG:1 Tablet(s) By Mouth Every Evening 07/15/21   [provider]  DULoxetine (CYMBALTA) 30 MG capsule Take 30 mg by mouth daily. 08/21/19   [provider]  estradiol (ESTRACE) 0.5 MG tablet TAKE ONE TABLET BY MOUTH BEFORE BREAKFAST 08/31/22   Cyril Mourning A, NP  ezetimibe (ZETIA) 10 MG tablet Take 10 mg by mouth daily. 02/20/20   [provider]  fluticasone (FLONASE) 50 MCG/ACT nasal spray Place 2 sprays into both nostrils daily. 11/15/22   Kambry Takacs-Warren, Sadie Haber, NP  irbesartan (AVAPRO) 75 MG tablet Take 300 mg by mouth daily at 2 PM. 07/03/14   [provider]  lamoTRIgine (LAMICTAL) 100 MG tablet Take 1 tablet (100 mg total) by mouth 2 (two) times daily. 04/14/22 04/09/23  Windell Norfolk, MD  LINZESS 145 MCG CAPS capsule TAKE ONE CAPSULE BY MOUTH BEFORE BREAKFAST 12/31/21   Letta Median, PA-C  metoprolol  succinate (TOPROL-XL) 25 MG 24 hr tablet Take 25 mg by mouth daily. 09/08/20   [provider]  ondansetron (ZOFRAN) 4 MG tablet Take 1 tablet (4 mg total) by mouth every 6 (six) hours. Patient taking differently: Take 4 mg by mouth every 8 (eight) hours as needed for nausea or vomiting. 11/10/19   Wurst, [place], PA-C  oxyCODONE-acetaminophen (PERCOCET/ROXICET) 5-325 MG tablet Take 1 tablet by mouth every 4 (four) hours as needed for severe pain (pain.). 02/12/21   Arman Bogus, MD  pantoprazole (PROTONIX) 40 MG tablet Take 1 tablet (40 mg total) by mouth 2 (two) times daily. 03/19/20   Letta Median, PA-C  pregabalin (LYRICA) 150 MG capsule Take 1 capsule (150 mg total) by mouth at bedtime. 07/26/22 02/21/23  Windell Norfolk, MD  progesterone (PROMETRIUM) 100 MG capsule TAKE 1 CAPSULE BY MOUTH EVERY DAY FOR 14 DAYS EVERY 3 MONTHS 08/31/22   Adline Potter, NP  trimethoprim-polymyxin b (POLYTRIM) ophthalmic solution Place 1 drop into both eyes every 6 (six) hours for 7 days. 11/15/22 11/22/22  Garison Genova-Warren,  Sadie Haber, NP  Vibegron (GEMTESA) 75 MG TABS Gemtesa 75 mg tablet  Take 1 tablet every day by oral route.    [provider]    Family History Family History  Problem Relation Age of Onset   Throat cancer Father    Heart disease Father    Cancer Father    Hypertension Father    Diabetes Father    Other Mother        esophageal stricture and gallbladder disease   Thyroid disease Mother    Other Sister        esophageal stricture and gallbladder problems.    Diabetes Sister    Hypertension Sister    Thyroid disease Sister    Heart disease Maternal Grandfather    Diabetes Maternal Grandfather    Arthritis Maternal Grandfather    Colon cancer Neg Hx    Liver disease Neg Hx    Pancreatic cancer Neg Hx     Social History Social History   Tobacco Use   Smoking status: Former    Current packs/day: 0.00    Average packs/day: 2.0 packs/day for 9.0 years (18.0 ttl pk-yrs)    Types: Cigarettes    Start date: 04/04/1985    Quit date: 04/04/1994    Years since quitting: 28.6   Smokeless tobacco: Never   Tobacco comments:    smoked 8 years, quit 18 years ago in the 1996  Vaping Use   Vaping status: Never Used  Substance Use Topics   Alcohol use: No    Alcohol/week: 0.0 standard drinks of alcohol   Drug use: No     Allergies   Benadryl [diphenhydramine hcl]   Review of Systems Review of Systems Per HPI  Physical Exam Triage Vital Signs ED Triage Vitals  Encounter Vitals Group     BP 11/20/22 1504 121/82     Systolic BP Percentile --      Diastolic BP Percentile --      Pulse Rate 11/20/22 1504 92     Resp 11/20/22 1504 18     Temp 11/20/22 1504 98.2 F (36.8 C)     Temp Source 11/20/22 1504 Oral     SpO2 11/20/22 1504 94 %     Weight --      Height --      Head Circumference --      Peak Flow --  Pain Score 11/20/22 1505 9     Pain Loc --      Pain Education --      Exclude from Growth Chart --    No data found.  Updated Vital Signs BP  121/82 (BP Location: Right Arm)   Pulse 92   Temp 98.2 F (36.8 C) (Oral)   Resp 18   LMP 07/06/2018   SpO2 94%   Visual Acuity Right Eye Distance:   Left Eye Distance:   Bilateral Distance:    Right Eye Near:   Left Eye Near:    Bilateral Near:     Physical Exam Vitals and nursing note reviewed.  Constitutional:      General: She is not in acute distress.    Appearance: Normal appearance.  HENT:     Head: Normocephalic.     Right Ear: Tympanic membrane, ear canal and external ear normal.     Left Ear: Tympanic membrane, ear canal and external ear normal.     Nose: Nose normal.     Mouth/Throat:     Mouth: Mucous membranes are moist.     Pharynx: Posterior oropharyngeal erythema present.  Eyes:     Extraocular Movements: Extraocular movements intact.     Conjunctiva/sclera: Conjunctivae normal.     Pupils: Pupils are equal, round, and reactive to light.  Cardiovascular:     Rate and Rhythm: Normal rate and regular rhythm.     Pulses: Normal pulses.     Heart sounds: Normal heart sounds.  Pulmonary:     Effort: Pulmonary effort is normal. No respiratory distress.     Breath sounds: Normal breath sounds. No stridor. No wheezing, rhonchi or rales.  Abdominal:     General: Bowel sounds are normal.     Palpations: Abdomen is soft.     Tenderness: There is no abdominal tenderness.  Musculoskeletal:     Cervical back: Normal range of motion.  Lymphadenopathy:     Cervical: No cervical adenopathy.  Skin:    General: Skin is warm and dry.  Neurological:     General: No focal deficit present.     Mental Status: She is alert and oriented to person, place, and time.  Psychiatric:        Mood and Affect: Mood normal.        Behavior: Behavior normal.      UC Treatments / Results  Labs (all labs ordered are listed, but only abnormal results are displayed) Labs Reviewed - No data to display  EKG   Radiology No results found.  Procedures Procedures (including  critical care time)  Medications Ordered in UC Medications - No data to display  Initial Impression / Assessment and Plan / UC Course  I have reviewed the triage vital signs and the nursing notes.  Pertinent labs & imaging results that were available during my care of the patient were reviewed by me and considered in my medical decision making (see chart for details).  The patient is well-appearing, she is in no acute distress, vital signs are stable.  Patient with throat pain has been present for the past 8 days with apparent worsening.  Will treat empirically with Augmentin 875/125 mg tablets for possible infection although throat culture was negative, prednisone 20 mg to help with throat inflammation and swelling, and viscous lidocaine 2% for patient to gargle and spit for throat pain.  Supportive care recommendations were provided and discussed with the patient to include continuing over-the-counter analgesics, a  soft diet, and allowing for plenty of fluids and rest.  Patient advised to follow-up with her primary care physician if symptoms fail to improve with this treatment.  Patient is in agreement with this plan of care and verbalizes understanding.  All questions were answered.  Patient stable for discharge.  Final Clinical Impressions(s) / UC Diagnoses   Final diagnoses:  Acute pharyngitis, unspecified etiology     Discharge Instructions      Take medication as prescribed. Increase fluids and allow for plenty of rest. Continue over-the-counter ibuprofen or Tylenol as needed for pain, fever, or general discomfort. Warm salt water gargles 3-4 times daily as needed for throat pain or discomfort. Recommend a soft diet to include soups, broth, yogurt, pudding, or Jell-O. As discussed, if symptoms do not improve with this treatment, please follow-up with your primary care physician for further evaluation. Follow-up as needed.     ED Prescriptions     Medication Sig Dispense  Auth. Provider   amoxicillin-clavulanate (AUGMENTIN) 875-125 MG tablet Take 1 tablet by mouth every 12 (twelve) hours. 14 tablet Kyrin Gratz-Warren, Sadie Haber, NP   predniSONE (DELTASONE) 20 MG tablet Take 1 tablet (20 mg total) by mouth daily with breakfast for 5 days. 5 tablet Jadden Yim-Warren, Sadie Haber, NP   lidocaine (XYLOCAINE) 2 % solution Gargle and spit 5 mL every 6 hours as needed for throat pain or discomfort. 100 mL Abigial Newville-Warren, Sadie Haber, NP      PDMP not reviewed this encounter.   Abran Cantor, NP 11/20/22 1528

## 2022-11-20 NOTE — Discharge Instructions (Signed)
Take medication as prescribed. Increase fluids and allow for plenty of rest. Continue over-the-counter ibuprofen or Tylenol as needed for pain, fever, or general discomfort. Warm salt water gargles 3-4 times daily as needed for throat pain or discomfort. Recommend a soft diet to include soups, broth, yogurt, pudding, or Jell-O. As discussed, if symptoms do not improve with this treatment, please follow-up with your primary care physician for further evaluation. Follow-up as needed.

## 2022-11-21 ENCOUNTER — Telehealth: Payer: Self-pay | Admitting: Neurology

## 2022-11-21 ENCOUNTER — Ambulatory Visit: Payer: 59 | Admitting: Neurology

## 2022-11-21 NOTE — Telephone Encounter (Signed)
LVM and sent mychart msg informing pt of need to reschedule 11/21/22 appt - MD out

## 2022-12-01 DIAGNOSIS — R569 Unspecified convulsions: Secondary | ICD-10-CM | POA: Diagnosis not present

## 2022-12-01 DIAGNOSIS — R232 Flushing: Secondary | ICD-10-CM | POA: Insufficient documentation

## 2022-12-01 DIAGNOSIS — J029 Acute pharyngitis, unspecified: Secondary | ICD-10-CM | POA: Diagnosis not present

## 2022-12-01 DIAGNOSIS — Z713 Dietary counseling and surveillance: Secondary | ICD-10-CM | POA: Diagnosis not present

## 2022-12-01 DIAGNOSIS — Z79899 Other long term (current) drug therapy: Secondary | ICD-10-CM | POA: Diagnosis not present

## 2022-12-01 DIAGNOSIS — G8929 Other chronic pain: Secondary | ICD-10-CM | POA: Diagnosis not present

## 2022-12-01 DIAGNOSIS — I10 Essential (primary) hypertension: Secondary | ICD-10-CM | POA: Diagnosis not present

## 2022-12-03 ENCOUNTER — Other Ambulatory Visit: Payer: Self-pay | Admitting: Adult Health

## 2022-12-22 ENCOUNTER — Ambulatory Visit: Payer: 59 | Admitting: Neurology

## 2022-12-24 ENCOUNTER — Other Ambulatory Visit: Payer: Self-pay | Admitting: Neurology

## 2022-12-26 NOTE — Telephone Encounter (Signed)
Requested Prescriptions   Pending Prescriptions Disp Refills   pregabalin (LYRICA) 150 MG capsule [Pharmacy Med Name: PREGABALIN 150 MG CAPS 150 Capsule] 30 capsule 1    Sig: TAKE 1 CAPSULE BY MOUTH EVERY DAY AT BEDTIME   Last seen 07/26/22 Next appt 01/02/23  Dispenses  Refusing as showing dispensed on 12/24/22 Dispensed Days Supply Quantity Provider Pharmacy  PREGABALIN  150 MG CAPS 12/24/2022 30 30 capsule Windell Norfolk, MD [facility] [place] [facility] [address]  PREGABALIN 150 MG CAPS 12/01/2022 30 30 each Windell Norfolk, MD [facility] [place] [facility] [address]  pregabalin 150 mg capsule 11/08/2022 30 30 each Windell Norfolk, MD [facility]  pregabalin 150 mg capsule 10/07/2022 30 30 each Windell Norfolk, MD [facility]  pregabalin 150 mg capsule 09/09/2022 30 30 each Windell Norfolk, MD [facility]  pregabalin 150 mg capsule 08/10/2022 30 30 each Windell Norfolk, MD [facility]  pregabalin 50 mg capsule 07/13/2022 30 30 each Benita Stabile, MD [facility]  pregabalin 50 mg capsule 06/13/2022 30 30 each FAROTIMI,TOMILOLA [facility]  pregabalin 50 mg capsule 05/12/2022 30 30 each Benita Stabile, MD [facility]  PREGABALIN  50 MG CAPS 04/12/2022 30 30 capsule Benita Stabile, MD [facility]Marland KitchenMarland Kitchen

## 2023-01-02 ENCOUNTER — Encounter: Payer: Self-pay | Admitting: Neurology

## 2023-01-02 ENCOUNTER — Ambulatory Visit (INDEPENDENT_AMBULATORY_CARE_PROVIDER_SITE_OTHER): Payer: 59 | Admitting: Neurology

## 2023-01-02 VITALS — BP 124/88 | Resp 16 | Ht 66.0 in | Wt 185.0 lb

## 2023-01-02 DIAGNOSIS — Z5181 Encounter for therapeutic drug level monitoring: Secondary | ICD-10-CM

## 2023-01-02 DIAGNOSIS — G40909 Epilepsy, unspecified, not intractable, without status epilepticus: Secondary | ICD-10-CM

## 2023-01-02 DIAGNOSIS — G2581 Restless legs syndrome: Secondary | ICD-10-CM

## 2023-01-02 MED ORDER — LAMOTRIGINE 100 MG PO TABS: 100.0000 mg | ORAL_TABLET | Freq: Two times a day (BID) | ORAL | 3 refills | Status: DC

## 2023-01-02 NOTE — Progress Notes (Signed)
[facility]  PATIENT: [name] DOB: Mar 31, 1971  REQUESTING CLINICIAN: Benita Stabile, MD HISTORY FROM: Patient and girlfriend  REASON FOR VISIT: Establish care for Epilepsy    HISTORICAL  CHIEF COMPLAINT:  Chief Complaint  Patient presents with   Seizures    Rm13, [name] friend present, [id number]:XWRU since last visit, pt reports issues getting well rested and not sleeping well.    INTERVAL HISTORY 01/02/2023:  Patient presents today for follow-up, she is accompanied by [name], last visit was in April, since then she has not had any seizure or seizure-like activity.  She is compliant with the lamotrigine 200 mg in the morning.  Reports that pregabalin was discontinued by PCP.  Currently she is not on any medication for her restless leg syndrome.  She said that the abnormal sensation and the leg movement is disrupting her sleep.   INTERVAL HISTORY 07/26/2022:  Patient presents today for follow-up, at last visit in January we have discontinued the phenytoin and increase the lamotrigine to 100 mg twice daily.  She could not tolerate the increase because she did have insomnia at night.  Her lamotrigine was decreased to 150 mg daily.  She reports that she was doing well until last week when she had a seizure at the doctor's office.  She was getting blood drawn, phlebotomist asked her to hold the cotton and applied pressure and when she looked at the blood she passed out.  EMS was not called.  She was observed at the doctor's office for about 30 minutes before going home.  She states she was tired for the next 3 days.  Other than that event she has been doing well.  She reports compliance with the medicine. She stated at night she still have difficulty sleeping on occasion due to restless leg and neuropathy. She was previously put on pregabalin 50 mg nightly but she is not sure of the indication.   INTERVAL HISTORY 04/14/2022:  Patient presents today for follow-up she is  accompanied by her girlfriend.  At last visit plan was to start lamotrigine.  Currently she is on lamotrigine 100 mg daily instead of 100 mg twice daily.  She is still on the phenytoin 200 mg twice daily.  She tolerates the medication very well.  Denies any side effect from the medication.  Overall doing well, no complaints, no concerns.   HISTORY OF PRESENT ILLNESS:  This is a 52 year old woman with past medical history hypertension, anxiety, seizure disorder who is presenting to establish care.  Patient reports history of seizures since childhood.  Seizure has been on and off per patient and she is currently on Dilantin 200 mg twice daily, reports being on Dilantin for many years.  She stated that she was on 1 medication in the past but does not remember the name.  She reports sometimes when she is in pain she will have seizures.  Her last seizure was associated with her twisting her left ankle, this was a month ago.  Prior to that her seizure was a month before and not triggered by pain.  She is compliant with the medication.  She denies any seizure risk factors and denies any major side effect with the Dilantin.  Her seizures are described as generalized convulsion followed by postictal confusion and tiredness.  Girlfriend reports that she sleeps the rest of the day after having a seizure.  No major injury associated with the seizures but she bit her tongue in the past and had  urinary incontinence.   Handedness: Right handed   Onset: Since childhood   Seizure Type: Generalized convulsion   Current frequency: 1 a month but can go months without seizures   Any injuries from seizures: Tongue biting  Seizure risk factors: Denies   Previous ASMs: Dilantin and another one that she does not remember the name   Currenty ASMs: Lamotrigine   ASMs side effects: None   Brain Images: Normal head CT   Previous EEGs: None available for review    OTHER MEDICAL CONDITIONS: Hypertension, Anxiety,  Seizure  REVIEW OF SYSTEMS: Full 14 system review of systems performed and negative with exception of: As noted in the HPI   ALLERGIES: Allergies  Allergen Reactions   Benadryl [Diphenhydramine Hcl] Rash    HOME MEDICATIONS: Outpatient Medications Prior to Visit  Medication Sig Dispense Refill   ALPRAZolam (XANAX) 0.5 MG tablet Take 0.5 mg by mouth 2 (two) times daily as needed for anxiety.   5   amLODipine (NORVASC) 5 MG tablet SMARTSIG:1 Tablet(s) By Mouth Every Evening     DULoxetine (CYMBALTA) 30 MG capsule Take 30 mg by mouth daily.     estradiol (ESTRACE) 0.5 MG tablet TAKE 1 TABLET BY MOUTH DAILY BEFORE BREAKFAST *REFILL REQUEST* 30 tablet 10   ezetimibe (ZETIA) 10 MG tablet Take 10 mg by mouth daily.     fluticasone (FLONASE) 50 MCG/ACT nasal spray Place 2 sprays into both nostrils daily. 16 g 0   irbesartan (AVAPRO) 75 MG tablet Take 300 mg by mouth daily at 2 PM.  12   LINZESS 145 MCG CAPS capsule TAKE ONE CAPSULE BY MOUTH BEFORE BREAKFAST 30 capsule 11   metoprolol succinate (TOPROL-XL) 25 MG 24 hr tablet Take 25 mg by mouth daily.     ondansetron (ZOFRAN) 4 MG tablet Take 1 tablet (4 mg total) by mouth every 6 (six) hours. (Patient taking differently: Take 4 mg by mouth every 8 (eight) hours as needed for nausea or vomiting.) 12 tablet 0   oxyCODONE-acetaminophen (PERCOCET/ROXICET) 5-325 MG tablet Take 1 tablet by mouth every 4 (four) hours as needed for severe pain (pain.). 30 tablet 0   pantoprazole (PROTONIX) 40 MG tablet Take 1 tablet (40 mg total) by mouth 2 (two) times daily. 180 tablet 3   progesterone (PROMETRIUM) 100 MG capsule TAKE 1 CAPSULE BY MOUTH EVERY DAY FOR 14 DAYS EVERY 3 MONTHS 14 capsule 6   Vibegron (GEMTESA) 75 MG TABS Gemtesa 75 mg tablet  Take 1 tablet every day by oral route.     lamoTRIgine (LAMICTAL) 100 MG tablet Take 1 tablet (100 mg total) by mouth 2 (two) times daily. 180 tablet 3   amoxicillin-clavulanate (AUGMENTIN) 875-125 MG tablet Take 1  tablet by mouth every 12 (twelve) hours. 14 tablet 0   lidocaine (XYLOCAINE) 2 % solution Gargle and spit 5 mL every 6 hours as needed for throat pain or discomfort. 100 mL 0   pregabalin (LYRICA) 150 MG capsule Take 1 capsule (150 mg total) by mouth at bedtime. 30 capsule 5   No facility-administered medications prior to visit.    PAST MEDICAL HISTORY: Past Medical History:  Diagnosis Date   Anxiety    Arthritis    Balance problems    Chronic abdominal pain    Chronic knee pain    Constipation    Difficulty walking    GERD (gastroesophageal reflux disease)    Headache    HTN (hypertension)    Low back pain  Panic attacks    Polyuria    Seizures (HCC)    unknown etiology; on dilantin; last seizures was a few years ago.   Sleep apnea     PAST SURGICAL HISTORY: Past Surgical History:  Procedure Laterality Date   BACK SURGERY     complicated by ureteral injury   CHONDROPLASTY Right 08/31/2016   Procedure: CHONDROPLASTY RIGHT FEMUR;  Surgeon: Vickki Hearing, MD;  Location: [facility];  Service: Orthopedics;  Laterality: Right;   COLONOSCOPY WITH PROPOFOL N/A 12/19/2019   Procedure: COLONOSCOPY WITH PROPOFOL;  Surgeon: Corbin Ade, Nonbleeding external and internal grade 3 hemorrhoids, three 2-71mm polyps, otherwise normal.  Suspected benign anorectal bleeding.  Pathology revealed hyperplastic polyps.  Repeat colonoscopy in 10 years.   ESOPHAGOGASTRODUODENOSCOPY N/A 09/11/2015   Procedure: ESOPHAGOGASTRODUODENOSCOPY (EGD);  Surgeon: Corbin Ade, MD;  Normal esophagus s/p dilation, few gastric polyps resected and retrieved, normal duodenum. Gastric polyps benign.    ESOPHAGOGASTRODUODENOSCOPY (EGD) WITH ESOPHAGEAL DILATION N/A 03/20/2013   Dr. Rourk:Normal EGD-status post passage of a Maloney dilator/Status post esophageal biopsy., benign path   ESOPHAGOGASTRODUODENOSCOPY (EGD) WITH PROPOFOL N/A 12/19/2019   Procedure: ESOPHAGOGASTRODUODENOSCOPY (EGD) WITH PROPOFOL;  Surgeon:  Corbin Ade, MD;  Normal esophagus s/p dilation, benign-appearing fundic gland polyps, normal examined duodenum.   KNEE ARTHROSCOPY WITH MEDIAL MENISECTOMY Right 08/31/2016   Procedure: KNEE ARTHROSCOPY WITH MEDIAL MENISECTOMY;  Surgeon: Vickki Hearing, MD;  Location: [facility];  Service: Orthopedics;  Laterality: Right;   KNEE ARTHROSCOPY WITH MEDIAL MENISECTOMY Right 09/13/2018   Procedure: KNEE ARTHROSCOPY WITH MEDIAL MENISCECTOMY AND MICROFRACTURE;  Surgeon: Vickki Hearing, MD;  Location: [facility];  Service: Orthopedics;  Laterality: Right;   LUMBAR LAMINECTOMY/DECOMPRESSION MICRODISCECTOMY Left 02/12/2021   Procedure: Left Lumbar Five- Sacral One Microdiscectomy;  Surgeon: Tia Alert, MD;  Location: [facility];  Service: Neurosurgery;  Laterality: Left;  Left Lumbar Five- Sacral One Microdiscectomy   MALONEY DILATION N/A 09/11/2015   Procedure: Elease Hashimoto DILATION;  Surgeon: Corbin Ade, MD;  Location: [facility];  Service: Endoscopy;  Laterality: N/A;   MALONEY DILATION N/A 12/19/2019   Procedure: Elease Hashimoto DILATION;  Surgeon: Corbin Ade, MD;  Location: [facility];  Service: Endoscopy;  Laterality: N/A;   POLYPECTOMY  12/19/2019   Procedure: POLYPECTOMY;  Surgeon: Corbin Ade, MD;  Location: [facility];  Service: Endoscopy;;    FAMILY HISTORY: Family History  Problem Relation Age of Onset   Throat cancer Father    Heart disease Father    Cancer Father    Hypertension Father    Diabetes Father    Other Mother        esophageal stricture and gallbladder disease   Thyroid disease Mother    Other Sister        esophageal stricture and gallbladder problems.    Diabetes Sister    Hypertension Sister    Thyroid disease Sister    Heart disease Maternal Grandfather    Diabetes Maternal Grandfather    Arthritis Maternal Grandfather    Colon cancer Neg Hx    Liver disease Neg Hx    Pancreatic cancer Neg Hx     SOCIAL HISTORY: Social History   Socioeconomic  History   Marital status: Significant Other    Spouse name: Not on file   Number of children: 0   Years of education: 12   Highest education level: Not on file  Occupational History   Occupation: disability    Employer: DISABLED  Tobacco Use   Smoking status: Former    Current packs/day: 0.00    Average packs/day: 2.0 packs/day for 9.0 years (18.0 ttl pk-yrs)    Types: Cigarettes    Start date: 04/04/1985    Quit date: 04/04/1994    Years since quitting: 28.7   Smokeless tobacco: Never   Tobacco comments:    smoked 8 years, quit 18 years ago in the 1996  Vaping Use   Vaping status: Never Used  Substance and Sexual Activity   Alcohol use: No    Alcohol/week: 0.0 standard drinks of alcohol   Drug use: No   Sexual activity: Not Currently    Birth control/protection: Post-menopausal  Other Topics Concern   Not on file  Social History Narrative   Not on file   Social Determinants of Health   Financial Resource Strain: Low Risk  (08/20/2021)   Overall Financial Resource Strain (CARDIA)    Difficulty of Paying Living Expenses: Not hard at all  Food Insecurity: No Food Insecurity (08/20/2021)   Hunger Vital Sign    Worried About Running Out of Food in the Last Year: Never true    Ran Out of Food in the Last Year: Never true  Transportation Needs: No Transportation Needs (08/20/2021)   PRAPARE - Administrator, Civil Service (Medical): No    Lack of Transportation (Non-Medical): No  Physical Activity: Insufficiently Active (08/20/2021)   Exercise Vital Sign    Days of Exercise per Week: 3 days    Minutes of Exercise per Session: 30 min  Stress: No Stress Concern Present (08/20/2021)   [employer] of Occupational Health - Occupational Stress Questionnaire    Feeling of Stress : Only a little  Social Connections: Unknown (08/20/2021)   Social Connection and Isolation Panel [NHANES]    Frequency of Communication with Friends and Family: More than three times a week     Frequency of Social Gatherings with Friends and Family: More than three times a week    Attends Religious Services: 1 to 4 times per year    Active Member of [employer] or Organizations: No    Attends Banker Meetings: Never    Marital Status: Patient declined  Catering manager Violence: Not At Risk (08/20/2021)   Humiliation, Afraid, Rape, and Kick questionnaire    Fear of Current or Ex-Partner: No    Emotionally Abused: No    Physically Abused: No    Sexually Abused: No    PHYSICAL EXAM  GENERAL EXAM/CONSTITUTIONAL: Vitals:  Vitals:   01/02/23 1323  BP: 124/88  Resp: 16  Weight: 185 lb (83.9 kg)  Height: 5\' 6"  (1.676 m)   Body mass index is 29.86 kg/m. Wt Readings from Last 3 Encounters:  01/02/23 185 lb (83.9 kg)  07/26/22 179 lb 8 oz (81.4 kg)  04/14/22 176 lb (79.8 kg)   Patient is in no distress; well developed, nourished and groomed; neck is supple  MUSCULOSKELETAL: Gait, strength, tone, movements noted in Neurologic exam below  NEUROLOGIC: MENTAL STATUS:      No data to display         awake, alert, oriented to person, place and time recent and remote memory intact normal attention and concentration language fluent, comprehension intact, naming intact fund of knowledge appropriate  CRANIAL NERVE:  2nd, 3rd, 4th, 6th - Visual fields full to confrontation, extraocular muscles intact, no nystagmus 5th - facial sensation symmetric 7th - facial strength symmetric 8th - hearing intact 9th -  palate elevates symmetrically, uvula midline 11th - shoulder shrug symmetric 12th - tongue protrusion midline  MOTOR:  normal bulk and tone, full strength in the BUE, BLE  SENSORY:  normal and symmetric to light touch  COORDINATION:  finger-nose-finger, fine finger movements normal  GAIT/STATION:  normal   DIAGNOSTIC DATA (LABS, IMAGING, TESTING) - I reviewed patient records, labs, notes, testing and imaging myself where available.  Lab  Results  Component Value Date   WBC 6.6 08/27/2021   HGB 13.4 08/27/2021   HCT 37.8 08/27/2021   MCV 89 08/27/2021   PLT 313 08/27/2021      Component Value Date/Time   NA 139 07/26/2022 1041   K 4.1 07/26/2022 1041   CL 100 07/26/2022 1041   CO2 23 07/26/2022 1041   GLUCOSE 83 07/26/2022 1041   GLUCOSE 89 02/09/2021 1150   BUN 9 07/26/2022 1041   CREATININE 0.83 07/26/2022 1041   CALCIUM 9.7 07/26/2022 1041   PROT 9.0 (H) 12/26/2017 1753   ALBUMIN 4.7 12/26/2017 1753   AST 20 12/26/2017 1753   AST 15 10/19/2013 0000   ALT 18 12/26/2017 1753   ALKPHOS 86 12/26/2017 1753   ALKPHOS 73 10/19/2013 0000   BILITOT 0.6 12/26/2017 1753   BILITOT 0.3 10/19/2013 0000   GFRNONAA >60 02/09/2021 1150   GFRAA >60 07/29/2019 0830   No results found for: "CHOL", "HDL", "LDLCALC", "LDLDIRECT", "TRIG" No results found for: "HGBA1C" No results found for: "VITAMINB12" Lab Results  Component Value Date   TSH 1.340 09/12/2018    CT Head 10/07/21 Normal head CT for age.  No explanation for patient's symptoms.   Routine EEG 03/24/22 Normal   ASSESSMENT AND PLAN  52 y.o. year old female  with anxiety, hypertension and seizure disorder who is presenting for follow-up.  She is currently on lamotrigine 200 mg daily, denies any seizure or seizure like activity.  Will check a level and continue patient on the current dose. For her restless leg syndrome, pregabalin was discontinued by PCP, she reporting that the pain and the need to move her leg at night is not improving.  Plan is to obtain iron studies.  Based on level will decide if we will add iron supplement versus starting dopaminergic medication.  I will see her in 6 months for follow-up but advised patient to contact me for any other concerns or questions.   1. Nonintractable epilepsy without status epilepticus, unspecified epilepsy type (HCC)   2. Therapeutic drug monitoring      Patient Instructions  Continue with lamotrigine 200 mg  daily Will check lamotrigine level, BMP, vitamin D level and iron studies for the possible RLS. Continue to follow-up with your PCP Return in 6 months or sooner if worse.   Per [facility] statutes, patients with seizures are not allowed to drive until they have been seizure-free for six months.  Other recommendations include using caution when using heavy equipment or power tools. Avoid working on ladders or at heights. Take showers instead of baths.  Do not swim alone.  Ensure the water temperature is not too high on the home water heater. Do not go swimming alone. Do not lock yourself in a room alone (i.e. bathroom). When caring for infants or small children, sit down when holding, feeding, or changing them to minimize risk of injury to the child in the event you have a seizure. Maintain good sleep hygiene. Avoid alcohol.  Also recommend adequate sleep, hydration, good diet and minimize stress.  During the Seizure  - First, ensure adequate ventilation and place patients on the floor on their left side  Loosen clothing around the neck and ensure the airway is patent. If the patient is clenching the teeth, do not force the mouth open with any object as this can cause severe damage - Remove all items from the surrounding that can be hazardous. The patient may be oblivious to what's happening and may not even know what he or she is doing. If the patient is confused and wandering, either gently guide him/her away and block access to outside areas - Reassure the individual and be comforting - Call 911. In most cases, the seizure ends before EMS arrives. However, there are cases when seizures may last over 3 to 5 minutes. Or the individual may have developed breathing difficulties or severe injuries. If a pregnant patient or a person with diabetes develops a seizure, it is prudent to call an ambulance. - Finally, if the patient does not regain full consciousness, then call EMS. Most patients  will remain confused for about 45 to 90 minutes after a seizure, so you must use judgment in calling for help. - Avoid restraints but make sure the patient is in a bed with padded side rails - Place the individual in a lateral position with the neck slightly flexed; this will help the saliva drain from the mouth and prevent the tongue from falling backward - Remove all nearby furniture and other hazards from the area - Provide verbal assurance as the individual is regaining consciousness - Provide the patient with privacy if possible - Call for help and start treatment as ordered by the caregiver   After the Seizure (Postictal Stage)  After a seizure, most patients experience confusion, fatigue, muscle pain and/or a headache. Thus, one should permit the individual to sleep. For the next few days, reassurance is essential. Being calm and helping reorient the person is also of importance.  Most seizures are painless and end spontaneously. Seizures are not harmful to others but can lead to complications such as stress on the lungs, brain and the heart. Individuals with prior lung problems may develop labored breathing and respiratory distress.     Orders Placed This Encounter  Procedures   Lamotrigine level   Basic Metabolic Panel   Vitamin D, 25-hydroxy   Iron, TIBC and Ferritin Panel    Meds ordered this encounter  Medications   lamoTRIgine (LAMICTAL) 100 MG tablet    Sig: Take 1 tablet (100 mg total) by mouth 2 (two) times daily.    Dispense:  180 tablet    Refill:  3    Return in about 6 months (around 07/02/2023).    Windell Norfolk, MD 01/02/2023, 2:04 PM  [facility] [address] [place], [place] [address] [phone]

## 2023-01-02 NOTE — Patient Instructions (Signed)
Continue with lamotrigine 200 mg daily Will check lamotrigine level, BMP, vitamin D level and iron studies for the possible RLS. Continue to follow-up with your PCP Return in 6 months or sooner if worse.

## 2023-01-04 LAB — IRON,TIBC AND FERRITIN PANEL
Ferritin: 86 ng/mL (ref 15–150)
Iron Saturation: 26 % (ref 15–55)
Iron: 106 ug/dL (ref 27–159)
Total Iron Binding Capacity: 404 ug/dL (ref 250–450)
UIBC: 298 ug/dL (ref 131–425)

## 2023-01-04 LAB — BASIC METABOLIC PANEL
BUN/Creatinine Ratio: 10 (ref 9–23)
BUN: 9 mg/dL (ref 6–24)
CO2: 27 mmol/L (ref 20–29)
Calcium: 10 mg/dL (ref 8.7–10.2)
Chloride: 96 mmol/L (ref 96–106)
Creatinine, Ser: 0.92 mg/dL (ref 0.57–1.00)
Glucose: 87 mg/dL (ref 70–99)
Potassium: 4.4 mmol/L (ref 3.5–5.2)
Sodium: 137 mmol/L (ref 134–144)
eGFR: 75 mL/min/{1.73_m2} (ref >59)

## 2023-01-04 LAB — VITAMIN D 25 HYDROXY (VIT D DEFICIENCY, FRACTURES): Vit D, 25-Hydroxy: 46 ng/mL (ref 30.0–100.0)

## 2023-01-04 LAB — LAMOTRIGINE LEVEL: Lamotrigine Lvl: 6.1 ug/mL (ref 2.0–20.0)

## 2023-01-05 ENCOUNTER — Ambulatory Visit: Payer: 59 | Admitting: Gastroenterology

## 2023-01-06 MED ORDER — GABAPENTIN 100 MG PO CAPS: 100.0000 mg | ORAL_CAPSULE | Freq: Every day | ORAL | 0 refills | Status: DC

## 2023-01-06 NOTE — Addendum Note (Signed)
Addended byWindell Norfolk on: 01/06/2023 07:23 AM   Modules accepted: Orders

## 2023-01-09 ENCOUNTER — Encounter: Payer: Self-pay | Admitting: Neurology

## 2023-01-09 ENCOUNTER — Other Ambulatory Visit: Payer: Self-pay | Admitting: Neurology

## 2023-01-09 NOTE — Telephone Encounter (Signed)
Requested Prescriptions   Pending Prescriptions Disp Refills   pregabalin (LYRICA) 150 MG capsule [Pharmacy Med Name: PREGABALIN 150 MG CAPS 150 Capsule] 30 capsule 1    Sig: TAKE 1 CAPSULE BY MOUTH EVERY DAY AT BEDTIME   Last seen 01/02/23, next appt 07/18/23 Dispenses  WOULD PATIENT TAKE GABAPENTIN AND PREGABALIN DR. Lanette Hampshire ADVISE            Dispensed Days Supply Quantity Provider Pharmacy  PREGABALIN 150 MG CAPS 12/24/2022 30 30 each Windell Norfolk, MD [facility] [place] [facility] [place]  PREGABALIN 150 MG CAPS 12/01/2022 30 30 each Windell Norfolk, MD [facility] [place] [facility] [address]  pregabalin 150 mg capsule 11/08/2022 30 30 each Windell Norfolk, MD [facility]  pregabalin 150 mg capsule 10/07/2022 30 30 each Windell Norfolk, MD [facility]  pregabalin 150 mg capsule 09/09/2022 30 30 each Windell Norfolk, MD [facility]  pregabalin 150 mg capsule 08/10/2022 30 30 each Windell Norfolk, MD [facility]  pregabalin 50 mg capsule 07/13/2022 30 30 each Benita Stabile, MD [facility]  pregabalin 50 mg capsule 06/13/2022 30 30 each FAROTIMI,TOMILOLA [facility]  pregabalin 50 mg capsule 05/12/2022 30 30 each Benita Stabile, MD [facility]  PREGABALIN  50 MG CAPS 04/12/2022 30 30 capsule Benita Stabile, MD [facility]

## 2023-01-10 NOTE — Telephone Encounter (Signed)
I discontinued the Lyrica but agree to stop Gabapentin and monitor rash.

## 2023-01-10 NOTE — Telephone Encounter (Signed)
Requested Prescriptions   Pending Prescriptions Disp Refills   pregabalin (LYRICA) 150 MG capsule [Pharmacy Med Name: PREGABALIN 150 MG CAPS 150 Capsule] 30 capsule 1    Sig: TAKE 1 CAPSULE BY MOUTH EVERY DAY AT BEDTIME   Last seen 01/02/23 Next appt 07/18/23  Dispenses   Dispensed Days Supply Quantity Provider Pharmacy  PREGABALIN 150 MG CAPS 12/24/2022 30 30 each Windell Norfolk, MD [facility] [place] [facility] [place]  PREGABALIN 150 MG CAPS 12/01/2022 30 30 each Windell Norfolk, MD [facility] [place] [facility] [place]  pregabalin 150 mg capsule 11/08/2022 30 30 each Windell Norfolk, MD [facility]  pregabalin 150 mg capsule 10/07/2022 30 30 each Windell Norfolk, MD [facility]  pregabalin 150 mg capsule 09/09/2022 30 30 each Windell Norfolk, MD [facility]  pregabalin 150 mg capsule 08/10/2022 30 30 each Windell Norfolk, MD [facility]  pregabalin 50 mg capsule 07/13/2022 30 30 each Benita Stabile, MD [facility]  pregabalin 50 mg capsule 06/13/2022 30 30 each FAROTIMI,TOMILOLA [facility]  pregabalin 50 mg capsule 05/12/2022 30 30 each Benita Stabile, MD [facility]  PREGABALIN  50 MG CAPS 04/12/2022 30 30 capsule Benita Stabile, MD [facility]

## 2023-01-12 DIAGNOSIS — R21 Rash and other nonspecific skin eruption: Secondary | ICD-10-CM | POA: Diagnosis not present

## 2023-01-12 DIAGNOSIS — G2581 Restless legs syndrome: Secondary | ICD-10-CM | POA: Diagnosis not present

## 2023-01-12 DIAGNOSIS — K219 Gastro-esophageal reflux disease without esophagitis: Secondary | ICD-10-CM | POA: Diagnosis not present

## 2023-01-12 DIAGNOSIS — T426X5A Adverse effect of other antiepileptic and sedative-hypnotic drugs, initial encounter: Secondary | ICD-10-CM | POA: Diagnosis not present

## 2023-01-12 DIAGNOSIS — T50905A Adverse effect of unspecified drugs, medicaments and biological substances, initial encounter: Secondary | ICD-10-CM | POA: Diagnosis not present

## 2023-01-12 DIAGNOSIS — L2989 Other pruritus: Secondary | ICD-10-CM | POA: Diagnosis not present

## 2023-01-12 DIAGNOSIS — I10 Essential (primary) hypertension: Secondary | ICD-10-CM | POA: Diagnosis not present

## 2023-01-12 DIAGNOSIS — G8929 Other chronic pain: Secondary | ICD-10-CM | POA: Diagnosis not present

## 2023-01-12 DIAGNOSIS — R232 Flushing: Secondary | ICD-10-CM | POA: Diagnosis not present

## 2023-01-12 DIAGNOSIS — R569 Unspecified convulsions: Secondary | ICD-10-CM | POA: Diagnosis not present

## 2023-01-12 DIAGNOSIS — Z713 Dietary counseling and surveillance: Secondary | ICD-10-CM | POA: Diagnosis not present

## 2023-02-16 ENCOUNTER — Encounter: Payer: Self-pay | Admitting: Orthopedic Surgery

## 2023-02-16 ENCOUNTER — Ambulatory Visit: Payer: 59 | Admitting: Orthopedic Surgery

## 2023-02-16 ENCOUNTER — Other Ambulatory Visit (INDEPENDENT_AMBULATORY_CARE_PROVIDER_SITE_OTHER): Payer: 59

## 2023-02-16 VITALS — BP 135/86 | HR 99 | Wt 184.2 lb

## 2023-02-16 DIAGNOSIS — M25561 Pain in right knee: Secondary | ICD-10-CM

## 2023-02-16 DIAGNOSIS — M7051 Other bursitis of knee, right knee: Secondary | ICD-10-CM

## 2023-02-16 MED ORDER — METHYLPREDNISOLONE ACETATE 40 MG/ML IJ SUSP
40.0000 mg | Freq: Once | INTRAMUSCULAR | Status: AC
  Administered 2023-02-16: 40 mg via INTRA_ARTICULAR

## 2023-02-16 NOTE — Progress Notes (Signed)
Chief Complaint  Patient presents with   Knee Pain    R medial pain and swelling for 1 wk. Pt states she was walking a lot of stairs a wk ago and felt a lot of grinding/crackling in the knee at the time    52 year old female was moving with a friend of hers or relative developed right knee pain medial side tenderness swelling  Had an arthroscopy in 2020 did well had no problems until this last week  Examination reveals a slight limp she has swelling and tenderness over the medial side of the knee and proximal tibia in the area of the Pez anserine bursa  The knee is otherwise stable McMurray signs negative no joint line tenderness or effusion  X-ray was taken it was negative  See report  Recommend injection Ice Over-the-counter medicines Follow-up as needed  Procedure note right knee injection for bursitis   verbal consent was obtained to inject right knee PES BURSA  Timeout was completed to confirm the site of injection  The medications used were 40 mg of Depo-Medrol and 1% lidocaine 3 cc  Anesthesia was provided by ethyl chloride and the skin was prepped with alcohol.  After cleaning the skin with alcohol a 25-gauge needle was used to inject the right knee bursa.  There were no complications and a sterile bandage was applied

## 2023-02-16 NOTE — Patient Instructions (Signed)
Apply ice 3 x a day   Take over the counter aleve or advil   Should resolve in 4-6 weeks

## 2023-02-23 DIAGNOSIS — E782 Mixed hyperlipidemia: Secondary | ICD-10-CM | POA: Diagnosis not present

## 2023-02-23 DIAGNOSIS — R7303 Prediabetes: Secondary | ICD-10-CM | POA: Diagnosis not present

## 2023-03-01 DIAGNOSIS — G2581 Restless legs syndrome: Secondary | ICD-10-CM | POA: Diagnosis not present

## 2023-03-01 DIAGNOSIS — I10 Essential (primary) hypertension: Secondary | ICD-10-CM | POA: Diagnosis not present

## 2023-03-01 DIAGNOSIS — N3281 Overactive bladder: Secondary | ICD-10-CM | POA: Diagnosis not present

## 2023-03-01 DIAGNOSIS — R252 Cramp and spasm: Secondary | ICD-10-CM | POA: Diagnosis not present

## 2023-03-01 DIAGNOSIS — M545 Low back pain, unspecified: Secondary | ICD-10-CM | POA: Diagnosis not present

## 2023-03-01 DIAGNOSIS — K219 Gastro-esophageal reflux disease without esophagitis: Secondary | ICD-10-CM | POA: Diagnosis not present

## 2023-03-01 DIAGNOSIS — G8929 Other chronic pain: Secondary | ICD-10-CM | POA: Diagnosis not present

## 2023-03-13 DIAGNOSIS — F319 Bipolar disorder, unspecified: Secondary | ICD-10-CM | POA: Insufficient documentation

## 2023-03-14 DIAGNOSIS — M5417 Radiculopathy, lumbosacral region: Secondary | ICD-10-CM | POA: Diagnosis not present

## 2023-03-21 ENCOUNTER — Other Ambulatory Visit: Payer: Self-pay | Admitting: Student

## 2023-03-21 DIAGNOSIS — M5417 Radiculopathy, lumbosacral region: Secondary | ICD-10-CM

## 2023-03-25 ENCOUNTER — Ambulatory Visit
Admission: RE | Admit: 2023-03-25 | Discharge: 2023-03-25 | Disposition: A | Discharge status: Home / Self Care | Payer: 59 | Source: Ambulatory Visit | Attending: Student | Admitting: Student

## 2023-03-25 DIAGNOSIS — M5416 Radiculopathy, lumbar region: Secondary | ICD-10-CM | POA: Diagnosis not present

## 2023-03-25 DIAGNOSIS — M5417 Radiculopathy, lumbosacral region: Secondary | ICD-10-CM

## 2023-03-25 MED ORDER — GADOPICLENOL 0.5 MMOL/ML IV SOLN
8.0000 mL | Freq: Once | INTRAVENOUS | Status: AC | PRN
  Administered 2023-03-25: 8 mL via INTRAVENOUS

## 2023-04-01 ENCOUNTER — Other Ambulatory Visit: Payer: 59

## 2023-04-17 DIAGNOSIS — L570 Actinic keratosis: Secondary | ICD-10-CM | POA: Diagnosis not present

## 2023-04-17 DIAGNOSIS — X32XXXD Exposure to sunlight, subsequent encounter: Secondary | ICD-10-CM | POA: Diagnosis not present

## 2023-04-27 DIAGNOSIS — M5417 Radiculopathy, lumbosacral region: Secondary | ICD-10-CM | POA: Diagnosis not present

## 2023-04-30 ENCOUNTER — Other Ambulatory Visit: Payer: 59

## 2023-05-02 ENCOUNTER — Ambulatory Visit: Payer: 59 | Admitting: Neurology

## 2023-05-12 NOTE — Therapy (Signed)
OUTPATIENT PHYSICAL THERAPY THORACOLUMBAR EVALUATION   Patient Name: [name] MRN: [id number] DOB:1971-01-17, 53 y.o., female Today's Date: 05/16/2023  END OF SESSION:  PT End of Session - 05/16/23 0850     Visit Number 1    Number of Visits 8    Date for PT Re-Evaluation 06/13/23    Authorization Type [employer] Medicare    PT Start Time [phone]    PT Stop Time 0930    PT Time Calculation (min) 40 min    Activity Tolerance Patient tolerated treatment well    Behavior During Therapy WFL for tasks assessed/performed             Past Medical History:  Diagnosis Date   Anxiety    Arthritis    Balance problems    Chronic abdominal pain    Chronic knee pain    Constipation    Difficulty walking    GERD (gastroesophageal reflux disease)    Headache    HTN (hypertension)    Low back pain    Panic attacks    Polyuria    Seizures (HCC)    unknown etiology; on dilantin; last seizures was a few years ago.   Sleep apnea    Past Surgical History:  Procedure Laterality Date   BACK SURGERY     complicated by ureteral injury   CHONDROPLASTY Right 08/31/2016   Procedure: CHONDROPLASTY RIGHT FEMUR;  Surgeon: Vickki Hearing, MD;  Location: [facility];  Service: Orthopedics;  Laterality: Right;   COLONOSCOPY WITH PROPOFOL N/A 12/19/2019   Procedure: COLONOSCOPY WITH PROPOFOL;  Surgeon: Corbin Ade, Nonbleeding external and internal grade 3 hemorrhoids, three 2-30mm polyps, otherwise normal.  Suspected benign anorectal bleeding.  Pathology revealed hyperplastic polyps.  Repeat colonoscopy in 10 years.   ESOPHAGOGASTRODUODENOSCOPY N/A 09/11/2015   Procedure: ESOPHAGOGASTRODUODENOSCOPY (EGD);  Surgeon: Corbin Ade, MD;  Normal esophagus s/p dilation, few gastric polyps resected and retrieved, normal duodenum. Gastric polyps benign.    ESOPHAGOGASTRODUODENOSCOPY (EGD) WITH ESOPHAGEAL DILATION N/A 03/20/2013   Dr. Rourk:Normal EGD-status post passage of a Maloney dilator/Status post  esophageal biopsy., benign path   ESOPHAGOGASTRODUODENOSCOPY (EGD) WITH PROPOFOL N/A 12/19/2019   Procedure: ESOPHAGOGASTRODUODENOSCOPY (EGD) WITH PROPOFOL;  Surgeon: Corbin Ade, MD;  Normal esophagus s/p dilation, benign-appearing fundic gland polyps, normal examined duodenum.   KNEE ARTHROSCOPY WITH MEDIAL MENISECTOMY Right 08/31/2016   Procedure: KNEE ARTHROSCOPY WITH MEDIAL MENISECTOMY;  Surgeon: Vickki Hearing, MD;  Location: [facility];  Service: Orthopedics;  Laterality: Right;   KNEE ARTHROSCOPY WITH MEDIAL MENISECTOMY Right 09/13/2018   Procedure: KNEE ARTHROSCOPY WITH MEDIAL MENISCECTOMY AND MICROFRACTURE;  Surgeon: Vickki Hearing, MD;  Location: [facility];  Service: Orthopedics;  Laterality: Right;   LUMBAR LAMINECTOMY/DECOMPRESSION MICRODISCECTOMY Left 02/12/2021   Procedure: Left Lumbar Five- Sacral One Microdiscectomy;  Surgeon: Tia Alert, MD;  Location: [facility];  Service: Neurosurgery;  Laterality: Left;  Left Lumbar Five- Sacral One Microdiscectomy   MALONEY DILATION N/A 09/11/2015   Procedure: Elease Hashimoto DILATION;  Surgeon: Corbin Ade, MD;  Location: [facility];  Service: Endoscopy;  Laterality: N/A;   MALONEY DILATION N/A 12/19/2019   Procedure: Elease Hashimoto DILATION;  Surgeon: Corbin Ade, MD;  Location: [facility];  Service: Endoscopy;  Laterality: N/A;   POLYPECTOMY  12/19/2019   Procedure: POLYPECTOMY;  Surgeon: Corbin Ade, MD;  Location: [facility];  Service: Endoscopy;;   Patient Active Problem List   Diagnosis Date Noted   Restless legs 01/12/2023  Hot flash not due to menopause 12/01/2022   Actinic keratosis 08/31/2022   Hyperkalemia 03/02/2022   Major depressive disorder 03/02/2022   Mass of left breast 02/16/2022   Acute conjunctivitis of left eye 01/12/2022   Pain of right thumb 11/03/2021   Headache 10/07/2021   Chest pain 10/07/2021   Prolapsed internal hemorrhoids, grade 2 09/02/2021   Fecal occult blood test positive 08/20/2021    Encounter for screening fecal occult blood testing 08/20/2021   Encounter for gynecological examination with Papanicolaou smear of cervix 08/20/2021   Hormone replacement therapy (HRT) 08/20/2021   Hemorrhoids 03/24/2021   Chronic pain 03/17/2021   Lumbar radiculopathy 10/02/2020   Lumbar pain 09/29/2020   Essential hypertension 08/27/2020   Rectal bleeding 08/12/2019   S/P right knee arthroscopy *with microfracture 09/13/18 09/18/2018   Acute medial meniscus tear of right knee    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    Derangement of posterior horn of medial meniscus of right knee    Primary osteoarthritis of right knee    Gastric polyp    Gastroesophageal reflux disease 08/24/2015   Hypoactive sexual desire disorder 05/14/2014   Abdominal pain, chronic, epigastric 12/08/2013   Constipation 12/08/2013   Esophageal dysphagia 02/22/2013   Abdominal pain, epigastric 02/22/2013   LUQ pain 02/22/2013   Difficulty walking 01/25/2012   Stiffness of joint, not elsewhere classified, ankle and foot 01/25/2012   Balance problems 01/25/2012   Ankle sprain 01/17/2012   Ankle pain, right 01/17/2012    PCP: Nita Sells, MD  REFERRING PROVIDER: Sherryl Manges, NP (works with [employer] in [place])  REFERRING DIAG: RADICULOPATHY, LUMBOSAC4RAL REGION DRY NEEDLING  Rationale for Evaluation and Treatment: Rehabilitation  THERAPY DIAG:  Low back pain, unspecified back pain laterality, unspecified chronicity, unspecified whether sciatica present  ONSET DATE: 3 months ago  SUBJECTIVE:                                                                                                                                                                                           SUBJECTIVE STATEMENT: Started bothering her 3 months with helping someone move; doing a lot of lifting and twisting; went back to Dr. Yetta Barre office and MRI done showed a mild disc bulge; referred to outpatient therapy.     PERTINENT HISTORY:  Microdiscectomy 2022 2008 back surgery  PAIN:  Are you having pain? Yes: NPRS scale: 3-4/10  Pain location: low back and up right side thoracic spine Pain description: spasms sometimes, otherwise achey Aggravating factors: bending or picking up sometimes, prolonged standing or walking, walking incline Relieving factors: ice, heat, Tylenol, oxycodone  PRECAUTIONS:  None   WEIGHT BEARING RESTRICTIONS: No  FALLS:  Has patient fallen in last 6 months? No   OCCUPATION: not working, on disability  PLOF: Independent  PATIENT GOALS: to make it all better  NEXT MD VISIT: 05/30/23  OBJECTIVE:  Note: Objective measures were completed at Evaluation unless otherwise noted.  DIAGNOSTIC FINDINGS:  IMPRESSION: 1. Postsurgical changes from left laminectomy at L5-S1 with enhancing tissue in the left subarticular zone surrounding the traversing left S1 nerve root, most consistent with postsurgical fibrosis. 2. Small right central disc protrusion at L5-S1 abutting the traversing right S1 nerve root. 3. Mild bilateral neural foraminal narrowing at L5-S1.    PATIENT SURVEYS:  Modified Oswestry 22/50 44%   COGNITION: Overall cognitive status: Within functional limits for tasks assessed      POSTURE: rounded shoulders and forward head  PALPATION: Tender L3 to S1  LUMBAR ROM:   AROM eval  Flexion To 5"past knee joint  Extension 25% available  Right lateral flexion 3" above knee joint line  Left lateral flexion 5" above knee joint line  Right rotation   Left rotation    (Blank rows = not tested)  LOWER EXTREMITY ROM:     Active  Right eval Left eval  Hip flexion    Hip extension    Hip abduction    Hip adduction    Hip internal rotation    Hip external rotation    Knee flexion    Knee extension    Ankle dorsiflexion    Ankle plantarflexion    Ankle inversion    Ankle eversion     (Blank rows = not tested)  LOWER EXTREMITY MMT:    MMT  Right eval Left eval  Hip flexion 4 4  Hip extension 4- 4-  Hip abduction    Hip adduction    Hip internal rotation    Hip external rotation    Knee flexion 4 4+  Knee extension 5 4+  Ankle dorsiflexion 5 4+  Ankle plantarflexion    Ankle inversion    Ankle eversion     (Blank rows = not tested)   FUNCTIONAL TESTS:  5 times sit to stand: 17.36 sec hands on thighs  GAIT: Distance walked: 50 ft in clinic Assistive device utilized: None Level of assistance: Modified independence Comments: slight decreased gait speed  TREATMENT DATE: 05/16/2023 physical therapy evaluation and HEP instruction; issued patient instructions for dry needling.                                                                                                                               PATIENT EDUCATION:  Education details: Patient educated on exam findings, POC, scope of PT, HEP, and what to expect next visit. Person educated: Patient Education method: Explanation, Demonstration, and Handouts Education comprehension: verbalized understanding, returned demonstration, verbal cues required, and tactile cues required  HOME EXERCISE PROGRAM: Access Code: [email or web address] URL: [email or web address] Date: 05/16/2023 Prepared by: [facility]hab  Exercises - Supine Transversus Abdominis Bracing - Hands on Stomach  - 1 x daily - 7 x weekly - 1 sets - 10 reps - 5 sec hold - Hooklying Single Knee to Chest Stretch  - 1 x daily - 7 x weekly - 1 sets - 10 reps - 20 sec hold  ASSESSMENT:  CLINICAL IMPRESSION: Patient is a 53 y.o. female who was seen today for physical therapy evaluation and treatment for RADICULOPATHY, LUMBOSAC4RAL REGION . Patient demonstrates muscle weakness, reduced ROM, and fascial restrictions which are likely contributing to symptoms of pain and are negatively impacting patient ability to perform ADLs and functional mobility tasks. Patient will benefit from skilled physical therapy  services to address these deficits to reduce pain and improve level of function with ADLs and functional mobility tasks.  OBJECTIVE IMPAIRMENTS: Abnormal gait, decreased activity tolerance, decreased mobility, decreased ROM, decreased strength, increased fascial restrictions, impaired perceived functional ability, increased muscle spasms, impaired flexibility, postural dysfunction, and pain.   ACTIVITY LIMITATIONS: carrying, lifting, bending, sitting, standing, squatting, sleeping, stairs, transfers, bed mobility, and locomotion level  PARTICIPATION LIMITATIONS: meal prep, cleaning, laundry, driving, shopping, and community activity    REHAB POTENTIAL: Good  CLINICAL DECISION MAKING: Evolving/moderate complexity  EVALUATION COMPLEXITY: Moderate   GOALS: Goals reviewed with patient? No  SHORT TERM GOALS: Target date: 05/30/2023  patient will be independent with initial HEP  Baseline: Goal status: INITIAL  2.  Patient will report 50% improvement overall  Baseline:  Goal status: INITIAL   LONG TERM GOALS: Target date: 06/13/2023  Patient will be independent in self management strategies to improve quality of life and functional outcomes.  Baseline:  Goal status: INITIAL  2.  Patient will report 75% improvement overall  Baseline:  Goal status: INITIAL  3.  Patient will improve 5 times sit to stand score to 15 sec or less to demonstrate improved functional mobility and increased leg strength. Baseline: 17.36 sec Goal status: INITIAL  4.  Patient will improve Modified Oswestry score by 10 points (12/50 or less) to demonstrate improved perceived function  Baseline: 22/50 Goal status: INITIAL  5.   Patient will increase leg MMT's to 4+ to 5/5 to allow navigation of steps without gait deviation or loss of balance  Baseline:  Goal status: INITIAL  PLAN:  PT FREQUENCY: 2x/week  PT DURATION: 4 weeks  PLANNED INTERVENTIONS: 97164- PT Re-evaluation, 97110-Therapeutic  exercises, 97530- Therapeutic activity, 97112- Neuromuscular re-education, 97535- Self Care, 86578- Manual therapy, [phone]- Gait training, [phone]- Orthotic Fit/training, [phone]- Canalith repositioning, [id number]- Aquatic Therapy, [phone]- Splinting, Patient/Family education, Balance training, Stair training, Taping, Dry Needling, Joint mobilization, Joint manipulation, Spinal manipulation, Spinal mobilization, Scar mobilization, and DME instructions. Marland Kitchen  PLAN FOR NEXT SESSION: Review HEP and goals;   10:19 AM, 05/16/23 Aerica Rincon Small Laurana Magistro MPT [facility] [place] [phone] [phone]  [facility] Medicare Auth Request Information  Date of referral: 04/27/2023 Referring provider: Sherryl Manges, NP Referring diagnosis (ICD 10)? M54.50, R29.898 Treatment diagnosis (ICD 10)? (if different than referring diagnosis) M54.50, R29.898  Functional Tool Score: Modified Oswestry 22/50 44%  What was this (referring dx) caused by? Ongoing Issue  Ashby Dawes of Condition: Initial Onset (within last 3 months)   Laterality: Both  Current Functional Measure Score: Other Modified Oswestry 22/50 44%  Objective measurements identify impairments when they are compared to normal values, the uninvolved extremity, and prior level of function.  [x]  Yes  []  No  Objective assessment of functional ability: Moderate functional limitations  Briefly describe symptoms: low back pain  How did symptoms start: 3 months ago; helping someone move  Average pain intensity:  Last 24 hours: 3-7/10  Past week: 3-7/10  How often does the pt experience symptoms? Constantly  How much have the symptoms interfered with usual daily activities? Moderately  How has condition changed since care began at this facility? No change  In general, how is the patients overall health? Good   BACK PAIN (STarT Back Screening Tool) Has pain spread down the leg(s) at some time in the last 2 weeks? NO Has the pt only walked short  distances because of back pain? yes Has patient dressed more slowly because of back pain in the past 2 weeks? yes Does patient think it's not safe for a person with this condition to be physically active? no Does patient have worrying thoughts a lot of the time? no Does patient feel back pain is terrible and will never get any better? no Has patient stopped enjoying things they usually enjoy? no Overall, how bothersome has back pain been in the last 2 weeks?                    Very Much

## 2023-05-16 ENCOUNTER — Other Ambulatory Visit: Payer: Self-pay

## 2023-05-16 ENCOUNTER — Ambulatory Visit (HOSPITAL_COMMUNITY): Payer: 59 | Attending: Student

## 2023-05-16 DIAGNOSIS — M545 Low back pain, unspecified: Secondary | ICD-10-CM

## 2023-05-16 DIAGNOSIS — R29898 Other symptoms and signs involving the musculoskeletal system: Secondary | ICD-10-CM | POA: Diagnosis not present

## 2023-05-16 NOTE — Patient Instructions (Signed)

## 2023-05-24 ENCOUNTER — Telehealth (HOSPITAL_COMMUNITY): Payer: Self-pay

## 2023-05-24 ENCOUNTER — Encounter (HOSPITAL_COMMUNITY): Payer: 59

## 2023-05-24 NOTE — Telephone Encounter (Signed)
 Called and left message with patient regarding missed appt; reminder of upcoming appt on Monday.

## 2023-05-25 ENCOUNTER — Encounter (HOSPITAL_COMMUNITY): Payer: 59

## 2023-05-29 ENCOUNTER — Ambulatory Visit (HOSPITAL_COMMUNITY): Payer: 59

## 2023-05-29 DIAGNOSIS — R29898 Other symptoms and signs involving the musculoskeletal system: Secondary | ICD-10-CM | POA: Diagnosis not present

## 2023-05-29 DIAGNOSIS — M545 Low back pain, unspecified: Secondary | ICD-10-CM

## 2023-05-29 NOTE — Therapy (Signed)
 OUTPATIENT PHYSICAL THERAPY THORACOLUMBAR EVALUATION   Patient Name: [name] MRN: [id number] DOB:10-27-1970, 53 y.o., female Today's Date: 05/29/2023  END OF SESSION:  PT End of Session - 05/29/23 0807     Visit Number 2    Number of Visits 8    Date for PT Re-Evaluation 06/13/23    Authorization Type [employer] Medicare no auth required for [facility] dual complete    PT Start Time 0805    PT Stop Time 0845    PT Time Calculation (min) 40 min    Activity Tolerance Patient tolerated treatment well    Behavior During Therapy WFL for tasks assessed/performed             Past Medical History:  Diagnosis Date   Anxiety    Arthritis    Balance problems    Chronic abdominal pain    Chronic knee pain    Constipation    Difficulty walking    GERD (gastroesophageal reflux disease)    Headache    HTN (hypertension)    Low back pain    Panic attacks    Polyuria    Seizures (HCC)    unknown etiology; on dilantin; last seizures was a few years ago.   Sleep apnea    Past Surgical History:  Procedure Laterality Date   BACK SURGERY     complicated by ureteral injury   CHONDROPLASTY Right 08/31/2016   Procedure: CHONDROPLASTY RIGHT FEMUR;  Surgeon: Vickki Hearing, MD;  Location: [facility];  Service: Orthopedics;  Laterality: Right;   COLONOSCOPY WITH PROPOFOL N/A 12/19/2019   Procedure: COLONOSCOPY WITH PROPOFOL;  Surgeon: Corbin Ade, Nonbleeding external and internal grade 3 hemorrhoids, three 2-45mm polyps, otherwise normal.  Suspected benign anorectal bleeding.  Pathology revealed hyperplastic polyps.  Repeat colonoscopy in 10 years.   ESOPHAGOGASTRODUODENOSCOPY N/A 09/11/2015   Procedure: ESOPHAGOGASTRODUODENOSCOPY (EGD);  Surgeon: Corbin Ade, MD;  Normal esophagus s/p dilation, few gastric polyps resected and retrieved, normal duodenum. Gastric polyps benign.    ESOPHAGOGASTRODUODENOSCOPY (EGD) WITH ESOPHAGEAL DILATION N/A 03/20/2013   Dr. Rourk:Normal EGD-status post  passage of a Maloney dilator/Status post esophageal biopsy., benign path   ESOPHAGOGASTRODUODENOSCOPY (EGD) WITH PROPOFOL N/A 12/19/2019   Procedure: ESOPHAGOGASTRODUODENOSCOPY (EGD) WITH PROPOFOL;  Surgeon: Corbin Ade, MD;  Normal esophagus s/p dilation, benign-appearing fundic gland polyps, normal examined duodenum.   KNEE ARTHROSCOPY WITH MEDIAL MENISECTOMY Right 08/31/2016   Procedure: KNEE ARTHROSCOPY WITH MEDIAL MENISECTOMY;  Surgeon: Vickki Hearing, MD;  Location: [facility];  Service: Orthopedics;  Laterality: Right;   KNEE ARTHROSCOPY WITH MEDIAL MENISECTOMY Right 09/13/2018   Procedure: KNEE ARTHROSCOPY WITH MEDIAL MENISCECTOMY AND MICROFRACTURE;  Surgeon: Vickki Hearing, MD;  Location: [facility];  Service: Orthopedics;  Laterality: Right;   LUMBAR LAMINECTOMY/DECOMPRESSION MICRODISCECTOMY Left 02/12/2021   Procedure: Left Lumbar Five- Sacral One Microdiscectomy;  Surgeon: Tia Alert, MD;  Location: [facility] OR;  Service: Neurosurgery;  Laterality: Left;  Left Lumbar Five- Sacral One Microdiscectomy   MALONEY DILATION N/A 09/11/2015   Procedure: Elease Hashimoto DILATION;  Surgeon: Corbin Ade, MD;  Location: [facility];  Service: Endoscopy;  Laterality: N/A;   MALONEY DILATION N/A 12/19/2019   Procedure: Elease Hashimoto DILATION;  Surgeon: Corbin Ade, MD;  Location: [facility];  Service: Endoscopy;  Laterality: N/A;   POLYPECTOMY  12/19/2019   Procedure: POLYPECTOMY;  Surgeon: Corbin Ade, MD;  Location: [facility];  Service: Endoscopy;;   Patient Active Problem List   Diagnosis Date Noted  Restless legs 01/12/2023   Hot flash not due to menopause 12/01/2022   Actinic keratosis 08/31/2022   Hyperkalemia 03/02/2022   Major depressive disorder 03/02/2022   Mass of left breast 02/16/2022   Acute conjunctivitis of left eye 01/12/2022   Pain of right thumb 11/03/2021   Headache 10/07/2021   Chest pain 10/07/2021   Prolapsed internal hemorrhoids, grade 2 09/02/2021   Fecal  occult blood test positive 08/20/2021   Encounter for screening fecal occult blood testing 08/20/2021   Encounter for gynecological examination with Papanicolaou smear of cervix 08/20/2021   Hormone replacement therapy (HRT) 08/20/2021   Hemorrhoids 03/24/2021   Chronic pain 03/17/2021   Lumbar radiculopathy 10/02/2020   Lumbar pain 09/29/2020   Essential hypertension 08/27/2020   Rectal bleeding 08/12/2019   S/P right knee arthroscopy *with microfracture 09/13/18 09/18/2018   Acute medial meniscus tear of right knee    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    Derangement of posterior horn of medial meniscus of right knee    Primary osteoarthritis of right knee    Gastric polyp    Gastroesophageal reflux disease 08/24/2015   Hypoactive sexual desire disorder 05/14/2014   Abdominal pain, chronic, epigastric 12/08/2013   Constipation 12/08/2013   Esophageal dysphagia 02/22/2013   Abdominal pain, epigastric 02/22/2013   LUQ pain 02/22/2013   Difficulty walking 01/25/2012   Stiffness of joint, not elsewhere classified, ankle and foot 01/25/2012   Balance problems 01/25/2012   Ankle sprain 01/17/2012   Ankle pain, right 01/17/2012    PCP: Nita Sells, MD  REFERRING PROVIDER: Sherryl Manges, NP (works with [employer] [facility] in [place])  REFERRING DIAG: RADICULOPATHY, LUMBOSAC4RAL REGION DRY NEEDLING  Rationale for Evaluation and Treatment: Rehabilitation  THERAPY DIAG:  Low back pain, unspecified back pain laterality, unspecified chronicity, unspecified whether sciatica present  Other symptoms and signs involving the musculoskeletal system  ONSET DATE: 3 months ago  SUBJECTIVE:                                                                                                                                                                                           SUBJECTIVE STATEMENT: Patient reports 2-3/10 pain today in her low back; ready to try dry needling  Started  bothering her 3 months with helping someone move; doing a lot of lifting and twisting; went back to Dr. Yetta Barre office and MRI done showed a mild disc bulge; referred to outpatient therapy.    PERTINENT HISTORY:  Microdiscectomy 2022 2008 back surgery  PAIN:  Are you having pain? Yes: NPRS scale: 3-4/10  Pain location: low back and up right side  thoracic spine Pain description: spasms sometimes, otherwise achey Aggravating factors: bending or picking up sometimes, prolonged standing or walking, walking incline Relieving factors: ice, heat, Tylenol, oxycodone  PRECAUTIONS: None   WEIGHT BEARING RESTRICTIONS: No  FALLS:  Has patient fallen in last 6 months? No   OCCUPATION: not working, on disability  PLOF: Independent  PATIENT GOALS: to make it all better  NEXT MD VISIT: 05/30/23  OBJECTIVE:  Note: Objective measures were completed at Evaluation unless otherwise noted.  DIAGNOSTIC FINDINGS:  IMPRESSION: 1. Postsurgical changes from left laminectomy at L5-S1 with enhancing tissue in the left subarticular zone surrounding the traversing left S1 nerve root, most consistent with postsurgical fibrosis. 2. Small right central disc protrusion at L5-S1 abutting the traversing right S1 nerve root. 3. Mild bilateral neural foraminal narrowing at L5-S1.    PATIENT SURVEYS:  Modified Oswestry 22/50 44%   COGNITION: Overall cognitive status: Within functional limits for tasks assessed      POSTURE: rounded shoulders and forward head  PALPATION: Tender L3 to S1  LUMBAR ROM:   AROM eval  Flexion To 5"past knee joint  Extension 25% available  Right lateral flexion 3" above knee joint line  Left lateral flexion 5" above knee joint line  Right rotation   Left rotation    (Blank rows = not tested)  LOWER EXTREMITY ROM:     Active  Right eval Left eval  Hip flexion    Hip extension    Hip abduction    Hip adduction    Hip internal rotation    Hip external rotation     Knee flexion    Knee extension    Ankle dorsiflexion    Ankle plantarflexion    Ankle inversion    Ankle eversion     (Blank rows = not tested)  LOWER EXTREMITY MMT:    MMT Right eval Left eval  Hip flexion 4 4  Hip extension 4- 4-  Hip abduction    Hip adduction    Hip internal rotation    Hip external rotation    Knee flexion 4 4+  Knee extension 5 4+  Ankle dorsiflexion 5 4+  Ankle plantarflexion    Ankle inversion    Ankle eversion     (Blank rows = not tested)   FUNCTIONAL TESTS:  5 times sit to stand: 17.36 sec hands on thighs  GAIT: Distance walked: 50 ft in clinic Assistive device utilized: None Level of assistance: Modified independence Comments: slight decreased gait speed  TREATMENT DATE:  05/29/23 Review of HEP and goals Trigger Point Dry Needling  Initial Treatment: Pt instructed on Dry Needling rational, procedures, and possible side effects. Pt instructed to expect mild to moderate muscle soreness later in the day and/or into the next day.  Pt instructed in methods to reduce muscle soreness. Pt instructed to continue prescribed HEP. Patient was educated on signs and symptoms of infection and other risk factors and advised to seek medical attention should they occur.  Patient verbalized understanding of these instructions and education.   Patient Verbal Consent Given: Yes Education Handout Provided: Previously Provided Muscles Treated: lumbar multifidus Electrical Stimulation Performed: No Treatment Response/Outcome: twitch on right side; aching bilaterally  Supine: Single knee to chest 5 x 20" Abdominal bracing 5 " x 10 Bridge x 10     05/16/2023 physical therapy evaluation and HEP instruction; issued patient instructions for dry needling.  PATIENT EDUCATION:  Education details: Patient educated on exam  findings, POC, scope of PT, HEP, and what to expect next visit. Person educated: Patient Education method: Explanation, Demonstration, and Handouts Education comprehension: verbalized understanding, returned demonstration, verbal cues required, and tactile cues required  HOME EXERCISE PROGRAM: Access Code: [email or web address] URL: [email or web address] Date: 05/29/2023 Prepared by: [facility]   - Supine Hamstring Stretch  - 2 x daily - 7 x weekly - 1 sets - 5 reps - 20 sec hold - Supine Bridge  - 2 x daily - 7 x weekly - 1 sets - 10 reps - Seated Scapular Retraction  - 2 x daily - 7 x weekly - 2 sets - 10 reps   Access Code: [email or web address] URL: [email or web address] Date: 05/16/2023 Prepared by: [facility]  Exercises - Supine Transversus Abdominis Bracing - Hands on Stomach  - 1 x daily - 7 x weekly - 1 sets - 10 reps - 5 sec hold - Hooklying Single Knee to Chest Stretch  - 1 x daily - 7 x weekly - 1 sets - 10 reps - 20 sec hold  ASSESSMENT:  CLINICAL IMPRESSION: Todays session started with a review of HEP and goals.  Patient verbalizes agreement with set rehab goals.  Dry needling to lumbar multifidus today with twitch on right and aching bilaterally.  Continued with core strengthening today; patient needs cues for abdominal bracing before bridge. Reviewed with patient what to expect after dry needling and she verbalizes under standing. Noted tight hamstrings so added hamstring stretch.  Updated HEP.  Patient will benefit from continued skilled therapy services to address deficits and promote return to optimal function.      Eval:Patient is a 53 y.o. female who was seen today for physical therapy evaluation and treatment for RADICULOPATHY, LUMBOSAC4RAL REGION . Patient demonstrates muscle weakness, reduced ROM, and fascial restrictions which are likely contributing to symptoms of pain and are negatively impacting patient ability to perform ADLs and functional mobility  tasks. Patient will benefit from skilled physical therapy services to address these deficits to reduce pain and improve level of function with ADLs and functional mobility tasks.  OBJECTIVE IMPAIRMENTS: Abnormal gait, decreased activity tolerance, decreased mobility, decreased ROM, decreased strength, increased fascial restrictions, impaired perceived functional ability, increased muscle spasms, impaired flexibility, postural dysfunction, and pain.   ACTIVITY LIMITATIONS: carrying, lifting, bending, sitting, standing, squatting, sleeping, stairs, transfers, bed mobility, and locomotion level  PARTICIPATION LIMITATIONS: meal prep, cleaning, laundry, driving, shopping, and community activity    REHAB POTENTIAL: Good  CLINICAL DECISION MAKING: Evolving/moderate complexity  EVALUATION COMPLEXITY: Moderate   GOALS: Goals reviewed with patient? No  SHORT TERM GOALS: Target date: 05/30/2023  patient will be independent with initial HEP  Baseline: Goal status: in progress  2.  Patient will report 50% improvement overall  Baseline:  Goal status: in progress   LONG TERM GOALS: Target date: 06/13/2023  Patient will be independent in self management strategies to improve quality of life and functional outcomes.  Baseline:  Goal status: in progress  2.  Patient will report 75% improvement overall  Baseline:  Goal status: in progress  3.  Patient will improve 5 times sit to stand score to 15 sec or less to demonstrate improved functional mobility and increased leg strength. Baseline: 17.36 sec Goal status: in progress   4.  Patient will improve Modified Oswestry score by 10 points (12/50 or less) to demonstrate improved perceived function  Baseline: 22/50 Goal status: in progress  5.  Patient will increase leg MMT's to 4+ to 5/5 to allow navigation of steps without gait deviation or loss of balance  Baseline:  Goal status: in progress  PLAN:  PT FREQUENCY: 2x/week  PT  DURATION: 4 weeks  PLANNED INTERVENTIONS: 97164- PT Re-evaluation, 97110-Therapeutic exercises, 97530- Therapeutic activity, 97112- Neuromuscular re-education, 97535- Self Care, 16109- Manual therapy, [phone]- Gait training, [phone]- Orthotic Fit/training, [phone]- Canalith repositioning, [id number]- Aquatic Therapy, [phone]- Splinting, Patient/Family education, Balance training, Stair training, Taping, Dry Needling, Joint mobilization, Joint manipulation, Spinal manipulation, Spinal mobilization, Scar mobilization, and DME instructions. Marland Kitchen  PLAN FOR NEXT SESSION: assess reaction to dry needing, continue with core strengthening  8:45 AM, 05/29/23 Alzada Brazee Small Kadience Macchi MPT [facility] [place] [phone]

## 2023-06-01 DIAGNOSIS — G2581 Restless legs syndrome: Secondary | ICD-10-CM | POA: Diagnosis not present

## 2023-06-01 DIAGNOSIS — G8929 Other chronic pain: Secondary | ICD-10-CM | POA: Diagnosis not present

## 2023-06-01 DIAGNOSIS — Z79899 Other long term (current) drug therapy: Secondary | ICD-10-CM | POA: Diagnosis not present

## 2023-06-01 DIAGNOSIS — I10 Essential (primary) hypertension: Secondary | ICD-10-CM | POA: Diagnosis not present

## 2023-06-01 DIAGNOSIS — G4700 Insomnia, unspecified: Secondary | ICD-10-CM | POA: Diagnosis not present

## 2023-06-06 ENCOUNTER — Ambulatory Visit (HOSPITAL_COMMUNITY): Payer: 59 | Attending: Student

## 2023-06-06 DIAGNOSIS — R29898 Other symptoms and signs involving the musculoskeletal system: Secondary | ICD-10-CM | POA: Insufficient documentation

## 2023-06-06 DIAGNOSIS — M545 Low back pain, unspecified: Secondary | ICD-10-CM | POA: Diagnosis not present

## 2023-06-06 NOTE — Therapy (Signed)
 OUTPATIENT PHYSICAL THERAPY THORACOLUMBAR TREATMENT   Patient Name: [name] MRN: [id number] DOB:1970-06-24, 53 y.o., female Today's Date: 06/06/2023  END OF SESSION:  PT End of Session - 06/06/23 1143     Visit Number 3    Number of Visits 8    Date for PT Re-Evaluation 06/13/23    Authorization Type [employer] Medicare    PT Start Time 1143    PT Stop Time 1227    PT Time Calculation (min) 44 min    Activity Tolerance Patient tolerated treatment well    Behavior During Therapy WFL for tasks assessed/performed             Past Medical History:  Diagnosis Date   Anxiety    Arthritis    Balance problems    Chronic abdominal pain    Chronic knee pain    Constipation    Difficulty walking    GERD (gastroesophageal reflux disease)    Headache    HTN (hypertension)    Low back pain    Panic attacks    Polyuria    Seizures (HCC)    unknown etiology; on dilantin; last seizures was a few years ago.   Sleep apnea    Past Surgical History:  Procedure Laterality Date   BACK SURGERY     complicated by ureteral injury   CHONDROPLASTY Right 08/31/2016   Procedure: CHONDROPLASTY RIGHT FEMUR;  Surgeon: Vickki Hearing, MD;  Location: [facility];  Service: Orthopedics;  Laterality: Right;   COLONOSCOPY WITH PROPOFOL N/A 12/19/2019   Procedure: COLONOSCOPY WITH PROPOFOL;  Surgeon: Corbin Ade, Nonbleeding external and internal grade 3 hemorrhoids, three 2-73mm polyps, otherwise normal.  Suspected benign anorectal bleeding.  Pathology revealed hyperplastic polyps.  Repeat colonoscopy in 10 years.   ESOPHAGOGASTRODUODENOSCOPY N/A 09/11/2015   Procedure: ESOPHAGOGASTRODUODENOSCOPY (EGD);  Surgeon: Corbin Ade, MD;  Normal esophagus s/p dilation, few gastric polyps resected and retrieved, normal duodenum. Gastric polyps benign.    ESOPHAGOGASTRODUODENOSCOPY (EGD) WITH ESOPHAGEAL DILATION N/A 03/20/2013   Dr. Rourk:Normal EGD-status post passage of a Maloney dilator/Status post  esophageal biopsy., benign path   ESOPHAGOGASTRODUODENOSCOPY (EGD) WITH PROPOFOL N/A 12/19/2019   Procedure: ESOPHAGOGASTRODUODENOSCOPY (EGD) WITH PROPOFOL;  Surgeon: Corbin Ade, MD;  Normal esophagus s/p dilation, benign-appearing fundic gland polyps, normal examined duodenum.   KNEE ARTHROSCOPY WITH MEDIAL MENISECTOMY Right 08/31/2016   Procedure: KNEE ARTHROSCOPY WITH MEDIAL MENISECTOMY;  Surgeon: Vickki Hearing, MD;  Location: [facility];  Service: Orthopedics;  Laterality: Right;   KNEE ARTHROSCOPY WITH MEDIAL MENISECTOMY Right 09/13/2018   Procedure: KNEE ARTHROSCOPY WITH MEDIAL MENISCECTOMY AND MICROFRACTURE;  Surgeon: Vickki Hearing, MD;  Location: [facility];  Service: Orthopedics;  Laterality: Right;   LUMBAR LAMINECTOMY/DECOMPRESSION MICRODISCECTOMY Left 02/12/2021   Procedure: Left Lumbar Five- Sacral One Microdiscectomy;  Surgeon: Tia Alert, MD;  Location: [facility];  Service: Neurosurgery;  Laterality: Left;  Left Lumbar Five- Sacral One Microdiscectomy   MALONEY DILATION N/A 09/11/2015   Procedure: Elease Hashimoto DILATION;  Surgeon: Corbin Ade, MD;  Location: [facility];  Service: Endoscopy;  Laterality: N/A;   MALONEY DILATION N/A 12/19/2019   Procedure: Elease Hashimoto DILATION;  Surgeon: Corbin Ade, MD;  Location: [facility];  Service: Endoscopy;  Laterality: N/A;   POLYPECTOMY  12/19/2019   Procedure: POLYPECTOMY;  Surgeon: Corbin Ade, MD;  Location: [facility];  Service: Endoscopy;;   Patient Active Problem List   Diagnosis Date Noted   Restless legs 01/12/2023  Hot flash not due to menopause 12/01/2022   Actinic keratosis 08/31/2022   Hyperkalemia 03/02/2022   Major depressive disorder 03/02/2022   Mass of left breast 02/16/2022   Acute conjunctivitis of left eye 01/12/2022   Pain of right thumb 11/03/2021   Headache 10/07/2021   Chest pain 10/07/2021   Prolapsed internal hemorrhoids, grade 2 09/02/2021   Fecal occult blood test positive 08/20/2021    Encounter for screening fecal occult blood testing 08/20/2021   Encounter for gynecological examination with Papanicolaou smear of cervix 08/20/2021   Hormone replacement therapy (HRT) 08/20/2021   Hemorrhoids 03/24/2021   Chronic pain 03/17/2021   Lumbar radiculopathy 10/02/2020   Lumbar pain 09/29/2020   Essential hypertension 08/27/2020   Rectal bleeding 08/12/2019   S/P right knee arthroscopy *with microfracture 09/13/18 09/18/2018   Acute medial meniscus tear of right knee    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    Derangement of posterior horn of medial meniscus of right knee    Primary osteoarthritis of right knee    Gastric polyp    Gastroesophageal reflux disease 08/24/2015   Hypoactive sexual desire disorder 05/14/2014   Abdominal pain, chronic, epigastric 12/08/2013   Constipation 12/08/2013   Esophageal dysphagia 02/22/2013   Abdominal pain, epigastric 02/22/2013   LUQ pain 02/22/2013   Difficulty walking 01/25/2012   Stiffness of joint, not elsewhere classified, ankle and foot 01/25/2012   Balance problems 01/25/2012   Ankle sprain 01/17/2012   Ankle pain, right 01/17/2012    PCP: Nita Sells, MD  REFERRING PROVIDER: Sherryl Manges, NP (works with [employer] [facility] in [place])  REFERRING DIAG: RADICULOPATHY, LUMBOSAC4RAL REGION DRY NEEDLING  Rationale for Evaluation and Treatment: Rehabilitation  THERAPY DIAG:  Low back pain, unspecified back pain laterality, unspecified chronicity, unspecified whether sciatica present  Other symptoms and signs involving the musculoskeletal system  ONSET DATE: 3 months ago  SUBJECTIVE:                                                                                                                                                                                           SUBJECTIVE STATEMENT: Was sore after last treatment but felt like it loosened up after dry needling after a couple days.    Started bothering her 3  months with helping someone move; doing a lot of lifting and twisting; went back to Dr. Yetta Barre office and MRI done showed a mild disc bulge; referred to outpatient therapy.    PERTINENT HISTORY:  Microdiscectomy 2022 2008 back surgery  PAIN:  Are you having pain? Yes: NPRS scale: 3-4/10  Pain location: low back and up right  side thoracic spine Pain description: spasms sometimes, otherwise achey Aggravating factors: bending or picking up sometimes, prolonged standing or walking, walking incline Relieving factors: ice, heat, Tylenol, oxycodone  PRECAUTIONS: None   WEIGHT BEARING RESTRICTIONS: No  FALLS:  Has patient fallen in last 6 months? No   OCCUPATION: not working, on disability  PLOF: Independent  PATIENT GOALS: to make it all better  NEXT MD VISIT: 05/30/23  OBJECTIVE:  Note: Objective measures were completed at Evaluation unless otherwise noted.  DIAGNOSTIC FINDINGS:  IMPRESSION: 1. Postsurgical changes from left laminectomy at L5-S1 with enhancing tissue in the left subarticular zone surrounding the traversing left S1 nerve root, most consistent with postsurgical fibrosis. 2. Small right central disc protrusion at L5-S1 abutting the traversing right S1 nerve root. 3. Mild bilateral neural foraminal narrowing at L5-S1.    PATIENT SURVEYS:  Modified Oswestry 22/50 44%   COGNITION: Overall cognitive status: Within functional limits for tasks assessed      POSTURE: rounded shoulders and forward head  PALPATION: Tender L3 to S1  LUMBAR ROM:   AROM eval  Flexion To 5"past knee joint  Extension 25% available  Right lateral flexion 3" above knee joint line  Left lateral flexion 5" above knee joint line  Right rotation   Left rotation    (Blank rows = not tested)  LOWER EXTREMITY ROM:     Active  Right eval Left eval  Hip flexion    Hip extension    Hip abduction    Hip adduction    Hip internal rotation    Hip external rotation    Knee  flexion    Knee extension    Ankle dorsiflexion    Ankle plantarflexion    Ankle inversion    Ankle eversion     (Blank rows = not tested)  LOWER EXTREMITY MMT:    MMT Right eval Left eval  Hip flexion 4 4  Hip extension 4- 4-  Hip abduction    Hip adduction    Hip internal rotation    Hip external rotation    Knee flexion 4 4+  Knee extension 5 4+  Ankle dorsiflexion 5 4+  Ankle plantarflexion    Ankle inversion    Ankle eversion     (Blank rows = not tested)   FUNCTIONAL TESTS:  5 times sit to stand: 17.36 sec hands on thighs  GAIT: Distance walked: 50 ft in clinic Assistive device utilized: None Level of assistance: Modified independence Comments: slight decreased gait speed  TREATMENT DATE:  06/06/23 Supine: Moist heat to low back x 5' to decrease pain and soreness and improve soft tissue extensibility Single knee to chest 5 x 20" Abdominal bracing 5 " x 10 Bridge x 10 Dead bug x 5 Dead bug with physioball x 10 each Hamstring stretch 5 x 20" each Shoulder abduction with green theraband x 10 Manual lumbar mobilization grade 2  Trigger Point Dry Needling  Subsequent Treatment: Instructions provided previously at initial dry needling treatment.   Patient Verbal Consent Given: Yes Education Handout Provided: Previously Provided Muscles Treated: multifidus lumborum Electrical Stimulation Performed: No Treatment Response/Outcome: loosening of muscle bilaterally       05/29/23 Review of HEP and goals Trigger Point Dry Needling  Initial Treatment: Pt instructed on Dry Needling rational, procedures, and possible side effects. Pt instructed to expect mild to moderate muscle soreness later in the day and/or into the next day.  Pt instructed in methods to reduce muscle soreness. Pt instructed  to continue prescribed HEP. Patient was educated on signs and symptoms of infection and other risk factors and advised to seek medical attention should they occur.   Patient verbalized understanding of these instructions and education.   Patient Verbal Consent Given: Yes Education Handout Provided: Previously Provided Muscles Treated: lumbar multifidus Electrical Stimulation Performed: No Treatment Response/Outcome: twitch on right side; aching bilaterally  Supine: Single knee to chest 5 x 20" Abdominal bracing 5 " x 10 Bridge x 10     05/16/2023 physical therapy evaluation and HEP instruction; issued patient instructions for dry needling.                                                                                                                               PATIENT EDUCATION:  Education details: Patient educated on exam findings, POC, scope of PT, HEP, and what to expect next visit. Person educated: Patient Education method: Explanation, Demonstration, and Handouts Education comprehension: verbalized understanding, returned demonstration, verbal cues required, and tactile cues required  HOME EXERCISE PROGRAM: Access Code: [email or web address] URL: [email or web address] Date: 05/29/2023 Prepared by: [facility]   - Supine Hamstring Stretch  - 2 x daily - 7 x weekly - 1 sets - 5 reps - 20 sec hold - Supine Bridge  - 2 x daily - 7 x weekly - 1 sets - 10 reps - Seated Scapular Retraction  - 2 x daily - 7 x weekly - 2 sets - 10 reps   Access Code: [email or web address] URL: [email or web address] Date: 05/16/2023 Prepared by: [facility]  Exercises - Supine Transversus Abdominis Bracing - Hands on Stomach  - 1 x daily - 7 x weekly - 1 sets - 10 reps - 5 sec hold - Hooklying Single Knee to Chest Stretch  - 1 x daily - 7 x weekly - 1 sets - 10 reps - 20 sec hold  ASSESSMENT:  CLINICAL IMPRESSION: Todays session with focus on decreasing lumbar pain and increasing core strength.  Trial of dead bug but difficulty with coordinating movement so added physio ball for improved cues with coordination and demonstrated Improved  proficiency. Needs cues to activate abdominals with all core exercises.  Continued with dry needling to tight and painful bilateral lumbar multifidus.   Patient will benefit from continued skilled therapy services to address deficits and promote return to optimal function.      Eval:Patient is a 53 y.o. female who was seen today for physical therapy evaluation and treatment for RADICULOPATHY, LUMBOSAC4RAL REGION . Patient demonstrates muscle weakness, reduced ROM, and fascial restrictions which are likely contributing to symptoms of pain and are negatively impacting patient ability to perform ADLs and functional mobility tasks. Patient will benefit from skilled physical therapy services to address these deficits to reduce pain and improve level of function with ADLs and functional mobility tasks.  OBJECTIVE IMPAIRMENTS: Abnormal gait, decreased activity tolerance, decreased mobility, decreased ROM, decreased strength, increased fascial restrictions, impaired perceived functional ability,  increased muscle spasms, impaired flexibility, postural dysfunction, and pain.   ACTIVITY LIMITATIONS: carrying, lifting, bending, sitting, standing, squatting, sleeping, stairs, transfers, bed mobility, and locomotion level  PARTICIPATION LIMITATIONS: meal prep, cleaning, laundry, driving, shopping, and community activity    REHAB POTENTIAL: Good  CLINICAL DECISION MAKING: Evolving/moderate complexity  EVALUATION COMPLEXITY: Moderate   GOALS: Goals reviewed with patient? No  SHORT TERM GOALS: Target date: 05/30/2023  patient will be independent with initial HEP  Baseline: Goal status: in progress  2.  Patient will report 50% improvement overall  Baseline:  Goal status: in progress   LONG TERM GOALS: Target date: 06/13/2023  Patient will be independent in self management strategies to improve quality of life and functional outcomes.  Baseline:  Goal status: in progress  2.  Patient will  report 75% improvement overall  Baseline:  Goal status: in progress  3.  Patient will improve 5 times sit to stand score to 15 sec or less to demonstrate improved functional mobility and increased leg strength. Baseline: 17.36 sec Goal status: in progress   4.  Patient will improve Modified Oswestry score by 10 points (12/50 or less) to demonstrate improved perceived function  Baseline: 22/50 Goal status: in progress  5.   Patient will increase leg MMT's to 4+ to 5/5 to allow navigation of steps without gait deviation or loss of balance  Baseline:  Goal status: in progress  PLAN:  PT FREQUENCY: 2x/week  PT DURATION: 4 weeks  PLANNED INTERVENTIONS: 97164- PT Re-evaluation, 97110-Therapeutic exercises, 97530- Therapeutic activity, 97112- Neuromuscular re-education, 97535- Self Care, 18841- Manual therapy, [phone]- Gait training, [phone]- Orthotic Fit/training, [phone]- Canalith repositioning, [id number]- Aquatic Therapy, [phone]- Splinting, Patient/Family education, Balance training, Stair training, Taping, Dry Needling, Joint mobilization, Joint manipulation, Spinal manipulation, Spinal mobilization, Scar mobilization, and DME instructions. Marland Kitchen  PLAN FOR NEXT SESSION: assess reaction to dry needing, continue with core strengthening; try DN shelf technique lumbar spine; iliostalis lumborum  12:28 PM, 06/06/23 Maybree Riling Small Daphane Odekirk MPT [facility] [place] [phone] Ph:[phone]

## 2023-06-08 ENCOUNTER — Emergency Department (HOSPITAL_COMMUNITY)
Admission: EM | Admit: 2023-06-08 | Discharge: 2023-06-08 | Disposition: A | Discharge status: Home / Self Care | Attending: Emergency Medicine | Admitting: Emergency Medicine

## 2023-06-08 ENCOUNTER — Ambulatory Visit
Admission: EM | Admit: 2023-06-08 | Discharge: 2023-06-08 | Disposition: A | Discharge status: Other Acute Inpatient Hospital | Attending: Nurse Practitioner | Admitting: Nurse Practitioner

## 2023-06-08 ENCOUNTER — Encounter: Payer: Self-pay | Admitting: Emergency Medicine

## 2023-06-08 ENCOUNTER — Other Ambulatory Visit: Payer: Self-pay

## 2023-06-08 ENCOUNTER — Telehealth (HOSPITAL_COMMUNITY): Payer: Self-pay

## 2023-06-08 ENCOUNTER — Encounter (HOSPITAL_COMMUNITY): Payer: 59

## 2023-06-08 ENCOUNTER — Emergency Department (HOSPITAL_COMMUNITY)

## 2023-06-08 ENCOUNTER — Encounter (HOSPITAL_COMMUNITY): Payer: Self-pay

## 2023-06-08 DIAGNOSIS — J111 Influenza due to unidentified influenza virus with other respiratory manifestations: Secondary | ICD-10-CM | POA: Diagnosis not present

## 2023-06-08 DIAGNOSIS — Z8669 Personal history of other diseases of the nervous system and sense organs: Secondary | ICD-10-CM

## 2023-06-08 DIAGNOSIS — R059 Cough, unspecified: Secondary | ICD-10-CM | POA: Diagnosis not present

## 2023-06-08 DIAGNOSIS — R569 Unspecified convulsions: Secondary | ICD-10-CM

## 2023-06-08 DIAGNOSIS — R42 Dizziness and giddiness: Secondary | ICD-10-CM | POA: Diagnosis not present

## 2023-06-08 DIAGNOSIS — B349 Viral infection, unspecified: Secondary | ICD-10-CM | POA: Diagnosis not present

## 2023-06-08 DIAGNOSIS — I959 Hypotension, unspecified: Secondary | ICD-10-CM | POA: Diagnosis not present

## 2023-06-08 LAB — COMPREHENSIVE METABOLIC PANEL
ALT: 73 U/L — ABNORMAL HIGH (ref 0–44)
AST: 65 U/L — ABNORMAL HIGH (ref 15–41)
Albumin: 3.7 g/dL (ref 3.5–5.0)
Alkaline Phosphatase: 44 U/L (ref 38–126)
Anion gap: 12 (ref 5–15)
BUN: 11 mg/dL (ref 6–20)
CO2: 22 mmol/L (ref 22–32)
Calcium: 8.7 mg/dL — ABNORMAL LOW (ref 8.9–10.3)
Chloride: 100 mmol/L (ref 98–111)
Creatinine, Ser: 0.93 mg/dL (ref 0.44–1.00)
GFR, Estimated: >60 mL/min (ref >60)
Glucose, Bld: 126 mg/dL — ABNORMAL HIGH (ref 70–99)
Potassium: 3.4 mmol/L — ABNORMAL LOW (ref 3.5–5.1)
Sodium: 134 mmol/L — ABNORMAL LOW (ref 135–145)
Total Bilirubin: 0.5 mg/dL (ref 0.0–1.2)
Total Protein: 7.2 g/dL (ref 6.5–8.1)

## 2023-06-08 LAB — CBC WITH DIFFERENTIAL/PLATELET
Abs Immature Granulocytes: 0.01 10*3/uL (ref 0.00–0.07)
Basophils Absolute: 0 10*3/uL (ref 0.0–0.1)
Basophils Relative: 1 %
Eosinophils Absolute: 0 10*3/uL (ref 0.0–0.5)
Eosinophils Relative: 0 %
HCT: 39.2 % (ref 36.0–46.0)
Hemoglobin: 13.2 g/dL (ref 12.0–15.0)
Immature Granulocytes: 0 %
Lymphocytes Relative: 18 %
Lymphs Abs: 0.7 10*3/uL (ref 0.7–4.0)
MCH: 30.8 pg (ref 26.0–34.0)
MCHC: 33.7 g/dL (ref 30.0–36.0)
MCV: 91.4 fL (ref 80.0–100.0)
Monocytes Absolute: 0.7 10*3/uL (ref 0.1–1.0)
Monocytes Relative: 18 %
Neutro Abs: 2.5 10*3/uL (ref 1.7–7.7)
Neutrophils Relative %: 63 %
Platelets: 227 10*3/uL (ref 150–400)
RBC: 4.29 MIL/uL (ref 3.87–5.11)
RDW: 12.9 % (ref 11.5–15.5)
WBC: 3.9 10*3/uL — ABNORMAL LOW (ref 4.0–10.5)
nRBC: 0 % (ref 0.0–0.2)

## 2023-06-08 LAB — POC COVID19/FLU A&B COMBO
Covid Antigen, POC: NEGATIVE
Influenza A Antigen, POC: NEGATIVE
Influenza B Antigen, POC: NEGATIVE

## 2023-06-08 LAB — LIPASE, BLOOD: Lipase: 33 U/L (ref 11–51)

## 2023-06-08 MED ORDER — LAMOTRIGINE 25 MG PO TABS
100.0000 mg | ORAL_TABLET | Freq: Once | ORAL | Status: AC
  Administered 2023-06-08: 100 mg via ORAL
  Filled 2023-06-08: qty 4

## 2023-06-08 MED ORDER — SODIUM CHLORIDE 0.9 % IV BOLUS
1000.0000 mL | Freq: Once | INTRAVENOUS | Status: AC
  Administered 2023-06-08: 1000 mL via INTRAVENOUS

## 2023-06-08 MED ORDER — SODIUM CHLORIDE 0.9 % IV BOLUS: 1000.0000 mL | Freq: Once | INTRAVENOUS | Status: DC

## 2023-06-08 NOTE — ED Notes (Addendum)
 Pt log rolled onto sheet to assist with transfer from treatment room chair. Pt tolerated well.   Pt alert at time of transfer. Report called to Lequita Halt, RN at [facility] ED.

## 2023-06-08 NOTE — ED Provider Notes (Signed)
 [place] EMERGENCY DEPARTMENT AT [facility] Provider Note   CSN: [id number] Arrival date & time: 06/08/23  4098     History  Chief Complaint  Patient presents with   Seizures    [name] is a 53 y.o. female.   Seizures Patient presents to the ER after seizure.  Was in urgent care for feeling bad.  Had a cough nausea vomiting diarrhea.  Has been feeling bad for about a week.  Feeling a little lightheaded.  Did not take her medicines this morning.  While at urgent care had witnessed seizure.  Reportedly about 10 months of seizure activity followed by postictal period.  No reported injury.  States she did not have her Lamictal this morning.     Home Medications Prior to Admission medications   Medication Sig Start Date End Date Taking? Authorizing Provider  ALPRAZolam Prudy Feeler) 0.5 MG tablet Take 0.5 mg by mouth 2 (two) times daily as needed for anxiety.  04/30/14   [provider]  amLODipine (NORVASC) 5 MG tablet SMARTSIG:1 Tablet(s) By Mouth Every Evening 07/15/21   [provider]  DULoxetine (CYMBALTA) 30 MG capsule Take 30 mg by mouth daily. 08/21/19   [provider]  estradiol (ESTRACE) 0.5 MG tablet TAKE 1 TABLET BY MOUTH DAILY BEFORE BREAKFAST *REFILL REQUEST* 12/06/22   Adline Potter, NP  ezetimibe (ZETIA) 10 MG tablet Take 10 mg by mouth daily. 02/20/20   [provider]  fluticasone (FLONASE) 50 MCG/ACT nasal spray Place 2 sprays into both nostrils daily. 11/15/22   Leath-Warren, Sadie Haber, NP  gabapentin (NEURONTIN) 100 MG capsule Take 1 capsule (100 mg total) by mouth at bedtime. 01/06/23 02/05/23  Windell Norfolk, MD  irbesartan (AVAPRO) 75 MG tablet Take 300 mg by mouth daily at 2 PM. 07/03/14   [provider]  lamoTRIgine (LAMICTAL) 100 MG tablet Take 1 tablet (100 mg total) by mouth 2 (two) times daily. 01/02/23 12/28/23  Windell Norfolk, MD  LINZESS 145 MCG CAPS capsule TAKE ONE CAPSULE BY MOUTH BEFORE  BREAKFAST 12/31/21   Letta Median, PA-C  metoprolol succinate (TOPROL-XL) 25 MG 24 hr tablet Take 25 mg by mouth daily. 09/08/20   [provider]  NEXLIZET 180-10 MG TABS Take 1 tablet by mouth daily.    [provider]  ondansetron (ZOFRAN) 4 MG tablet Take 1 tablet (4 mg total) by mouth every 6 (six) hours. Patient taking differently: Take 4 mg by mouth every 8 (eight) hours as needed for nausea or vomiting. 11/10/19   Wurst, [place], PA-C  oxyCODONE-acetaminophen (PERCOCET/ROXICET) 5-325 MG tablet Take 1 tablet by mouth every 4 (four) hours as needed for severe pain (pain.). 02/12/21   Arman Bogus, MD  pantoprazole (PROTONIX) 40 MG tablet Take 1 tablet (40 mg total) by mouth 2 (two) times daily. 03/19/20   Letta Median, PA-C  pregabalin (LYRICA) 150 MG capsule Take 150 mg by mouth daily.    [provider]  progesterone (PROMETRIUM) 100 MG capsule TAKE 1 CAPSULE BY MOUTH EVERY DAY FOR 14 DAYS EVERY 3 MONTHS 08/31/22   Adline Potter, NP  Vibegron (GEMTESA) 75 MG TABS Gemtesa 75 mg tablet  Take 1 tablet every day by oral route.    [provider]      Allergies    Benadryl [diphenhydramine hcl]    Review of Systems   Review of Systems  Neurological:  Positive for seizures.    Physical Exam Updated Vital Signs  BP 117/82   Pulse 99   Temp (!) 101 F (38.3 C) (Oral) Comment: Dr. Rubin Payor made aware  Resp 16   Wt 83.5 kg   LMP 07/06/2018   SpO2 94%   BMI 29.70 kg/m  Physical Exam Vitals and nursing note reviewed.  Cardiovascular:     Rate and Rhythm: Regular rhythm.  Pulmonary:     Breath sounds: No wheezing or rhonchi.  Abdominal:     Tenderness: There is no abdominal tenderness.  Musculoskeletal:        General: No tenderness.     Cervical back: Neck supple.  Skin:    General: Skin is warm.  Neurological:     Mental Status: She is alert. Mental status is at baseline.     ED Results / Procedures / Treatments    Labs (all labs ordered are listed, but only abnormal results are displayed) Labs Reviewed  COMPREHENSIVE METABOLIC PANEL - Abnormal; Notable for the following components:      Result Value   Sodium 134 (*)    Potassium 3.4 (*)    Glucose, Bld 126 (*)    Calcium 8.7 (*)    AST 65 (*)    ALT 73 (*)    All other components within normal limits  CBC WITH DIFFERENTIAL/PLATELET - Abnormal; Notable for the following components:   WBC 3.9 (*)    All other components within normal limits  LIPASE, BLOOD  LAMOTRIGINE LEVEL    EKG None  Radiology DG Chest Portable 1 View Result Date: 06/08/2023 CLINICAL DATA:  Cough EXAM: PORTABLE CHEST 1 VIEW COMPARISON:  None Available. FINDINGS: Lungs volumes are small, but are symmetric and are clear. No pneumothorax or pleural effusion. Cardiac size within normal limits. Pulmonary vascularity is normal. Osseous structures are age-appropriate. No acute bone abnormality. IMPRESSION: 1. Pulmonary hypoinflation. Electronically Signed   By: Helyn Numbers M.D.   On: 06/08/2023 21:55    Procedures Procedures    Medications Ordered in ED Medications  sodium chloride 0.9 % bolus 1,000 mL (0 mLs Intravenous Hold 06/08/23 2024)  lamoTRIgine (LAMICTAL) tablet 100 mg (100 mg Oral Given 06/08/23 2217)    ED Course/ Medical Decision Making/ A&P                                 Medical Decision Making Amount and/or Complexity of Data Reviewed Labs: ordered. Radiology: ordered.  Risk Prescription drug management.   Patient reported URI symptoms nausea vomiting diarrhea and seizure activity.  History of seizure disorder.  Well-appearing at this time.  Do not take her Lamictal this morning.  Will give fluid bolus.  Will get basic blood work including a Lamictal level although this will not return while in the ER.  Blood work reassuring.  Chest x-ray does not show pneumonia.  Negative viral testing in urgent care.  No further seizures.  Likely viral infection.   Appears stable for discharge home.  Follow-up with neurology.  Had been given oral dose of her Lamictal that she missed this morning.        Final Clinical Impression(s) / ED Diagnoses Final diagnoses:  Viral syndrome  Seizure (HCC)    Rx / DC Orders ED Discharge Orders          Ordered    Ambulatory referral to Neurology       Comments: An appointment is requested in approximately: 2 weeks   06/08/23 2253  Benjiman Core, MD 06/08/23 [phone]

## 2023-06-08 NOTE — ED Triage Notes (Signed)
 Pt was seen at [facility] for flu like symptoms with a negative swab and had a seizure lasting a few seconds.  Pt has a hx of seizures.

## 2023-06-08 NOTE — Telephone Encounter (Signed)
 No show; Left message regarding missed appt this morning and reminder of upcoming appt.  9:03 AM, 06/08/23 [name] MPT [facility] [place] [phone] [phone]

## 2023-06-08 NOTE — ED Provider Notes (Addendum)
 [facility]    CSN: [id number] Arrival date & time: 06/08/23  1714      History   Chief Complaint Chief Complaint  Patient presents with   Generalized Body Aches    HPI [name] is a 53 y.o. female.   The history is provided by the patient.   Patient presents for a 1 day history of generalized bodyaches, nausea, cough, vomiting, and diarrhea.  Patient with episode of nausea and vomiting after she was triage.  Patient became pale, diaphoretic, and weak.  Patient with seizure activity lasting for approximately 10 seconds with abnormal posturing during the seizure.  Patient was postictal after seizure activity.  Patient remained alert and oriented x 3.  Patient with underlying history of seizure activity.  Patient's friend who accompanied her states patient developed seizures when she gets sick.  She states that the patient has been stable.  Patient is currently taking lamotrigine 100 mg for her seizure activity.  Past Medical History:  Diagnosis Date   Anxiety    Arthritis    Balance problems    Chronic abdominal pain    Chronic knee pain    Constipation    Difficulty walking    GERD (gastroesophageal reflux disease)    Headache    HTN (hypertension)    Low back pain    Panic attacks    Polyuria    Seizures (HCC)    unknown etiology; on dilantin; last seizures was a few years ago.   Sleep apnea     Patient Active Problem List   Diagnosis Date Noted   Restless legs 01/12/2023   Hot flash not due to menopause 12/01/2022   Actinic keratosis 08/31/2022   Hyperkalemia 03/02/2022   Major depressive disorder 03/02/2022   Mass of left breast 02/16/2022   Acute conjunctivitis of left eye 01/12/2022   Pain of right thumb 11/03/2021   Headache 10/07/2021   Chest pain 10/07/2021   Prolapsed internal hemorrhoids, grade 2 09/02/2021   Fecal occult blood test positive 08/20/2021   Encounter for screening fecal occult blood testing 08/20/2021   Encounter  for gynecological examination with Papanicolaou smear of cervix 08/20/2021   Hormone replacement therapy (HRT) 08/20/2021   Hemorrhoids 03/24/2021   Chronic pain 03/17/2021   Lumbar radiculopathy 10/02/2020   Lumbar pain 09/29/2020   Essential hypertension 08/27/2020   Rectal bleeding 08/12/2019   S/P right knee arthroscopy *with microfracture 09/13/18 09/18/2018   Acute medial meniscus tear of right knee    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    Derangement of posterior horn of medial meniscus of right knee    Primary osteoarthritis of right knee    Gastric polyp    Gastroesophageal reflux disease 08/24/2015   Hypoactive sexual desire disorder 05/14/2014   Abdominal pain, chronic, epigastric 12/08/2013   Constipation 12/08/2013   Esophageal dysphagia 02/22/2013   Abdominal pain, epigastric 02/22/2013   LUQ pain 02/22/2013   Difficulty walking 01/25/2012   Stiffness of joint, not elsewhere classified, ankle and foot 01/25/2012   Balance problems 01/25/2012   Ankle sprain 01/17/2012   Ankle pain, right 01/17/2012    Past Surgical History:  Procedure Laterality Date   BACK SURGERY     complicated by ureteral injury   CHONDROPLASTY Right 08/31/2016   Procedure: CHONDROPLASTY RIGHT FEMUR;  Surgeon: Vickki Hearing, MD;  Location: [facility];  Service: Orthopedics;  Laterality: Right;   COLONOSCOPY WITH PROPOFOL N/A 12/19/2019   Procedure: COLONOSCOPY WITH PROPOFOL;  Surgeon: Corbin Ade, Nonbleeding external and internal grade 3 hemorrhoids, three 2-32mm polyps, otherwise normal.  Suspected benign anorectal bleeding.  Pathology revealed hyperplastic polyps.  Repeat colonoscopy in 10 years.   ESOPHAGOGASTRODUODENOSCOPY N/A 09/11/2015   Procedure: ESOPHAGOGASTRODUODENOSCOPY (EGD);  Surgeon: Corbin Ade, MD;  Normal esophagus s/p dilation, few gastric polyps resected and retrieved, normal duodenum. Gastric polyps benign.    ESOPHAGOGASTRODUODENOSCOPY (EGD) WITH ESOPHAGEAL  DILATION N/A 03/20/2013   Dr. Rourk:Normal EGD-status post passage of a Maloney dilator/Status post esophageal biopsy., benign path   ESOPHAGOGASTRODUODENOSCOPY (EGD) WITH PROPOFOL N/A 12/19/2019   Procedure: ESOPHAGOGASTRODUODENOSCOPY (EGD) WITH PROPOFOL;  Surgeon: Corbin Ade, MD;  Normal esophagus s/p dilation, benign-appearing fundic gland polyps, normal examined duodenum.   KNEE ARTHROSCOPY WITH MEDIAL MENISECTOMY Right 08/31/2016   Procedure: KNEE ARTHROSCOPY WITH MEDIAL MENISECTOMY;  Surgeon: Vickki Hearing, MD;  Location: [facility];  Service: Orthopedics;  Laterality: Right;   KNEE ARTHROSCOPY WITH MEDIAL MENISECTOMY Right 09/13/2018   Procedure: KNEE ARTHROSCOPY WITH MEDIAL MENISCECTOMY AND MICROFRACTURE;  Surgeon: Vickki Hearing, MD;  Location: [facility];  Service: Orthopedics;  Laterality: Right;   LUMBAR LAMINECTOMY/DECOMPRESSION MICRODISCECTOMY Left 02/12/2021   Procedure: Left Lumbar Five- Sacral One Microdiscectomy;  Surgeon: Tia Alert, MD;  Location: [facility];  Service: Neurosurgery;  Laterality: Left;  Left Lumbar Five- Sacral One Microdiscectomy   MALONEY DILATION N/A 09/11/2015   Procedure: Elease Hashimoto DILATION;  Surgeon: Corbin Ade, MD;  Location: [facility];  Service: Endoscopy;  Laterality: N/A;   MALONEY DILATION N/A 12/19/2019   Procedure: Elease Hashimoto DILATION;  Surgeon: Corbin Ade, MD;  Location: [facility];  Service: Endoscopy;  Laterality: N/A;   POLYPECTOMY  12/19/2019   Procedure: POLYPECTOMY;  Surgeon: Corbin Ade, MD;  Location: [facility];  Service: Endoscopy;;    OB History     Gravida  1   Para      Term      Preterm      AB  1   Living         SAB  1   IAB      Ectopic      Multiple      Live Births               Home Medications    Prior to Admission medications   Medication Sig Start Date End Date Taking? Authorizing Provider  ALPRAZolam Prudy Feeler) 0.5 MG tablet Take 0.5 mg by mouth 2 (two) times daily as needed  for anxiety.  04/30/14   [provider]  amLODipine (NORVASC) 5 MG tablet SMARTSIG:1 Tablet(s) By Mouth Every Evening 07/15/21   [provider]  DULoxetine (CYMBALTA) 30 MG capsule Take 30 mg by mouth daily. 08/21/19   [provider]  estradiol (ESTRACE) 0.5 MG tablet TAKE 1 TABLET BY MOUTH DAILY BEFORE BREAKFAST *REFILL REQUEST* 12/06/22   Adline Potter, NP  ezetimibe (ZETIA) 10 MG tablet Take 10 mg by mouth daily. 02/20/20   [provider]  fluticasone (FLONASE) 50 MCG/ACT nasal spray Place 2 sprays into both nostrils daily. 11/15/22   Leath-Warren, Sadie Haber, NP  gabapentin (NEURONTIN) 100 MG capsule Take 1 capsule (100 mg total) by mouth at bedtime. 01/06/23 02/05/23  Windell Norfolk, MD  irbesartan (AVAPRO) 75 MG tablet Take 300 mg by mouth daily at 2 PM. 07/03/14   [provider]  lamoTRIgine (LAMICTAL) 100 MG tablet Take 1 tablet (100 mg total) by mouth 2 (two) times  daily. 01/02/23 12/28/23  Windell Norfolk, MD  LINZESS 145 MCG CAPS capsule TAKE ONE CAPSULE BY MOUTH BEFORE BREAKFAST 12/31/21   Letta Median, PA-C  metoprolol succinate (TOPROL-XL) 25 MG 24 hr tablet Take 25 mg by mouth daily. 09/08/20   [provider]  NEXLIZET 180-10 MG TABS Take 1 tablet by mouth daily.    [provider]  ondansetron (ZOFRAN) 4 MG tablet Take 1 tablet (4 mg total) by mouth every 6 (six) hours. Patient taking differently: Take 4 mg by mouth every 8 (eight) hours as needed for nausea or vomiting. 11/10/19   Wurst, [place], PA-C  oxyCODONE-acetaminophen (PERCOCET/ROXICET) 5-325 MG tablet Take 1 tablet by mouth every 4 (four) hours as needed for severe pain (pain.). 02/12/21   Arman Bogus, MD  pantoprazole (PROTONIX) 40 MG tablet Take 1 tablet (40 mg total) by mouth 2 (two) times daily. 03/19/20   Letta Median, PA-C  pregabalin (LYRICA) 150 MG capsule Take 150 mg by mouth daily.    [provider]  progesterone (PROMETRIUM) 100  MG capsule TAKE 1 CAPSULE BY MOUTH EVERY DAY FOR 14 DAYS EVERY 3 MONTHS 08/31/22   Adline Potter, NP  Vibegron (GEMTESA) 75 MG TABS Gemtesa 75 mg tablet  Take 1 tablet every day by oral route.    [provider]    Family History Family History  Problem Relation Age of Onset   Throat cancer Father    Heart disease Father    Cancer Father    Hypertension Father    Diabetes Father    Other Mother        esophageal stricture and gallbladder disease   Thyroid disease Mother    Other Sister        esophageal stricture and gallbladder problems.    Diabetes Sister    Hypertension Sister    Thyroid disease Sister    Heart disease Maternal Grandfather    Diabetes Maternal Grandfather    Arthritis Maternal Grandfather    Colon cancer Neg Hx    Liver disease Neg Hx    Pancreatic cancer Neg Hx     Social History Social History   Tobacco Use   Smoking status: Former    Current packs/day: 0.00    Average packs/day: 2.0 packs/day for 9.0 years (18.0 ttl pk-yrs)    Types: Cigarettes    Start date: 04/04/1985    Quit date: 04/04/1994    Years since quitting: 29.1   Smokeless tobacco: Never   Tobacco comments:    smoked 8 years, quit 18 years ago in the 1996  Vaping Use   Vaping status: Never Used  Substance Use Topics   Alcohol use: No    Alcohol/week: 0.0 standard drinks of alcohol   Drug use: No     Allergies   Benadryl [diphenhydramine hcl]   Review of Systems Review of Systems Per HPI  Physical Exam Triage Vital Signs ED Triage Vitals  Encounter Vitals Group     BP 06/08/23 1754 116/77     Systolic BP Percentile --      Diastolic BP Percentile --      Pulse Rate 06/08/23 1754 (!) 121     Resp 06/08/23 1754 20     Temp 06/08/23 1754 99.4 F (37.4 C)     Temp Source 06/08/23 1754 Oral     SpO2 06/08/23 1754 93 %     Weight --      Height --  Head Circumference --      Peak Flow --      Pain Score 06/08/23 1755 7     Pain Loc --      Pain  Education --      Exclude from Growth Chart --    No data found.  Updated Vital Signs BP 123/83 (BP Location: Right Arm)   Pulse 82   Temp 99.4 F (37.4 C) (Oral)   Resp 20   LMP 07/06/2018   SpO2 93%   Visual Acuity Right Eye Distance:   Left Eye Distance:   Bilateral Distance:    Right Eye Near:   Left Eye Near:    Bilateral Near:     Physical Exam Vitals and nursing note reviewed.  Constitutional:      Appearance: She is ill-appearing and diaphoretic.     Comments: pale  HENT:     Head: Normocephalic.  Cardiovascular:     Rate and Rhythm: Regular rhythm. Tachycardia present.     Pulses: Normal pulses.     Heart sounds: Normal heart sounds.  Pulmonary:     Effort: Pulmonary effort is normal.     Breath sounds: Normal breath sounds.  Abdominal:     General: Bowel sounds are normal.     Palpations: Abdomen is soft.  Musculoskeletal:     Cervical back: Normal range of motion.  Skin:    Coloration: Skin is pale.  Neurological:     Mental Status: She is oriented to person, place, and time. She is lethargic.     Motor: Weakness and seizure activity present.      UC Treatments / Results  Labs (all labs ordered are listed, but only abnormal results are displayed) Labs Reviewed  POC COVID19/FLU A&B COMBO - Normal    EKG   Radiology No results found.  Procedures Procedures (including critical care time)  Medications Ordered in UC Medications  sodium chloride 0.9 % bolus 1,000 mL (has no administration in time range)    Initial Impression / Assessment and Plan / UC Course  I have reviewed the triage vital signs and the nursing notes.  Pertinent labs & imaging results that were available during my care of the patient were reviewed by me and considered in my medical decision making (see chart for details).  Patient presents to this clinic for influenza-like illness that started over the past 24 hours.  While patient was in the clinic, she experienced  an episode of nausea and vomiting with subsequent seizure-like activity.  COVID/flu test was negative.  #20g PIV established, normal saline bolus was started.  Patient remained lethargic.  EMS was activated.  Patient's vital signs remained stable.  Patient also remained alert and oriented x 3.  EMS present, patient was transferred to stretcher.  Patient transported to [facility] via EMS. Final Clinical Impressions(s) / UC Diagnoses   Final diagnoses:  Seizure (HCC)  History of seizure disorder  Influenza-like illness   Discharge Instructions   None    ED Prescriptions   None    PDMP not reviewed this encounter.   Abran Cantor, NP 06/08/23 1850    Abran Cantor, NP 06/12/23 1149

## 2023-06-08 NOTE — ED Triage Notes (Signed)
 Pt reports generalized body aches, nausea, cough, emesis, diarrhea since yesterday.

## 2023-06-08 NOTE — ED Notes (Signed)
 Patient is being discharged from the Urgent Care and sent to the Emergency Department via EMS . Per NP, patient is in need of higher level of care due to seizure. Patient is aware and verbalizes understanding of plan of care.  Vitals:   06/08/23 1754  BP: 116/77  Pulse: (!) 121  Resp: 20  Temp: 99.4 F (37.4 C)  SpO2: 93%

## 2023-06-08 NOTE — ED Notes (Signed)
 Pt asked to go to restroom in UC. Pt emesis x1 and returned to room. Pt family opened treatment room door and reported "she's saying she is going to pass out." Pt given ice pack, pt noted to be pale. NP notified.  Pt lays back and noted to have eyes roll back, all extremities drawn and tight, generalized trembling noted. Pt noted to have garbled sounds during seizure. Pt assisted on side by RN and NP. EMS called by CMA. Family at bedside and report pt has chronic history of seizures. Is currently on seizure medication.

## 2023-06-11 ENCOUNTER — Ambulatory Visit
Admission: EM | Admit: 2023-06-11 | Discharge: 2023-06-11 | Disposition: A | Discharge status: Home / Self Care | Attending: Family Medicine | Admitting: Family Medicine

## 2023-06-11 DIAGNOSIS — J069 Acute upper respiratory infection, unspecified: Secondary | ICD-10-CM

## 2023-06-11 MED ORDER — BENZONATATE 200 MG PO CAPS: 200.0000 mg | ORAL_CAPSULE | Freq: Three times a day (TID) | ORAL | 0 refills | Status: DC | PRN

## 2023-06-11 MED ORDER — FLUTICASONE PROPIONATE 50 MCG/ACT NA SUSP: 1.0000 | Freq: Two times a day (BID) | NASAL | 2 refills | Status: AC

## 2023-06-11 MED ORDER — DEXAMETHASONE SODIUM PHOSPHATE 10 MG/ML IJ SOLN
10.0000 mg | Freq: Once | INTRAMUSCULAR | Status: AC
  Administered 2023-06-11: 10 mg via INTRAMUSCULAR

## 2023-06-11 NOTE — ED Triage Notes (Signed)
 Pt reports headache, cough, runny nose. States she was seen here on Friday for the Flu like sx's  while here she had a seizure , and was not treated. Would like to be re-evaluated for sx's

## 2023-06-11 NOTE — ED Provider Notes (Signed)
 [facility]    CSN: [id number] Arrival date & time: 06/11/23  1302      History   Chief Complaint No chief complaint on file.   HPI [name] is a 52 y.o. female.   Patient presenting today with 5-day history of headache, cough, runny nose, chest tightness at times.  Denies recent fever, chest pain, shortness of breath, abdominal pain, vomiting, diarrhea.  Came for evaluation of the symptoms several days ago but had a seizure while in clinic so was sent via EMS to the emergency department where she states she was negative for COVID and flu but ultimately was not treated for her viral symptoms, only worked up for her seizure symptoms.  Taking Mucinex and other cold and congestion medications with minimal relief.  No known history of chronic pulmonary disease.    Past Medical History:  Diagnosis Date   Anxiety    Arthritis    Balance problems    Chronic abdominal pain    Chronic knee pain    Constipation    Difficulty walking    GERD (gastroesophageal reflux disease)    Headache    HTN (hypertension)    Low back pain    Panic attacks    Polyuria    Seizures (HCC)    unknown etiology; on dilantin; last seizures was a few years ago.   Sleep apnea     Patient Active Problem List   Diagnosis Date Noted   Restless legs 01/12/2023   Hot flash not due to menopause 12/01/2022   Actinic keratosis 08/31/2022   Hyperkalemia 03/02/2022   Major depressive disorder 03/02/2022   Mass of left breast 02/16/2022   Acute conjunctivitis of left eye 01/12/2022   Pain of right thumb 11/03/2021   Headache 10/07/2021   Chest pain 10/07/2021   Prolapsed internal hemorrhoids, grade 2 09/02/2021   Fecal occult blood test positive 08/20/2021   Encounter for screening fecal occult blood testing 08/20/2021   Encounter for gynecological examination with Papanicolaou smear of cervix 08/20/2021   Hormone replacement therapy (HRT) 08/20/2021   Hemorrhoids 03/24/2021    Chronic pain 03/17/2021   Lumbar radiculopathy 10/02/2020   Lumbar pain 09/29/2020   Essential hypertension 08/27/2020   Rectal bleeding 08/12/2019   S/P right knee arthroscopy *with microfracture 09/13/18 09/18/2018   Acute medial meniscus tear of right knee    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    Derangement of posterior horn of medial meniscus of right knee    Primary osteoarthritis of right knee    Gastric polyp    Gastroesophageal reflux disease 08/24/2015   Hypoactive sexual desire disorder 05/14/2014   Abdominal pain, chronic, epigastric 12/08/2013   Constipation 12/08/2013   Esophageal dysphagia 02/22/2013   Abdominal pain, epigastric 02/22/2013   LUQ pain 02/22/2013   Difficulty walking 01/25/2012   Stiffness of joint, not elsewhere classified, ankle and foot 01/25/2012   Balance problems 01/25/2012   Ankle sprain 01/17/2012   Ankle pain, right 01/17/2012    Past Surgical History:  Procedure Laterality Date   BACK SURGERY     complicated by ureteral injury   CHONDROPLASTY Right 08/31/2016   Procedure: CHONDROPLASTY RIGHT FEMUR;  Surgeon: Vickki Hearing, MD;  Location: [facility];  Service: Orthopedics;  Laterality: Right;   COLONOSCOPY WITH PROPOFOL N/A 12/19/2019   Procedure: COLONOSCOPY WITH PROPOFOL;  Surgeon: Corbin Ade, Nonbleeding external and internal grade 3 hemorrhoids, three 2-35mm polyps, otherwise normal.  Suspected benign anorectal bleeding.  Pathology revealed hyperplastic polyps.  Repeat colonoscopy in 10 years.   ESOPHAGOGASTRODUODENOSCOPY N/A 09/11/2015   Procedure: ESOPHAGOGASTRODUODENOSCOPY (EGD);  Surgeon: Corbin Ade, MD;  Normal esophagus s/p dilation, few gastric polyps resected and retrieved, normal duodenum. Gastric polyps benign.    ESOPHAGOGASTRODUODENOSCOPY (EGD) WITH ESOPHAGEAL DILATION N/A 03/20/2013   Dr. Rourk:Normal EGD-status post passage of a Maloney dilator/Status post esophageal biopsy., benign path    ESOPHAGOGASTRODUODENOSCOPY (EGD) WITH PROPOFOL N/A 12/19/2019   Procedure: ESOPHAGOGASTRODUODENOSCOPY (EGD) WITH PROPOFOL;  Surgeon: Corbin Ade, MD;  Normal esophagus s/p dilation, benign-appearing fundic gland polyps, normal examined duodenum.   KNEE ARTHROSCOPY WITH MEDIAL MENISECTOMY Right 08/31/2016   Procedure: KNEE ARTHROSCOPY WITH MEDIAL MENISECTOMY;  Surgeon: Vickki Hearing, MD;  Location: [facility];  Service: Orthopedics;  Laterality: Right;   KNEE ARTHROSCOPY WITH MEDIAL MENISECTOMY Right 09/13/2018   Procedure: KNEE ARTHROSCOPY WITH MEDIAL MENISCECTOMY AND MICROFRACTURE;  Surgeon: Vickki Hearing, MD;  Location: [facility];  Service: Orthopedics;  Laterality: Right;   LUMBAR LAMINECTOMY/DECOMPRESSION MICRODISCECTOMY Left 02/12/2021   Procedure: Left Lumbar Five- Sacral One Microdiscectomy;  Surgeon: Tia Alert, MD;  Location: [facility];  Service: Neurosurgery;  Laterality: Left;  Left Lumbar Five- Sacral One Microdiscectomy   MALONEY DILATION N/A 09/11/2015   Procedure: Elease Hashimoto DILATION;  Surgeon: Corbin Ade, MD;  Location: [facility];  Service: Endoscopy;  Laterality: N/A;   MALONEY DILATION N/A 12/19/2019   Procedure: Elease Hashimoto DILATION;  Surgeon: Corbin Ade, MD;  Location: [facility];  Service: Endoscopy;  Laterality: N/A;   POLYPECTOMY  12/19/2019   Procedure: POLYPECTOMY;  Surgeon: Corbin Ade, MD;  Location: [facility];  Service: Endoscopy;;    OB History     Gravida  1   Para      Term      Preterm      AB  1   Living         SAB  1   IAB      Ectopic      Multiple      Live Births               Home Medications    Prior to Admission medications   Medication Sig Start Date End Date Taking? Authorizing Provider  benzonatate (TESSALON) 200 MG capsule Take 1 capsule (200 mg total) by mouth 3 (three) times daily as needed for cough. 06/11/23  Yes Particia Nearing, PA-C  fluticasone [facility]) 50 MCG/ACT nasal spray Place 1  spray into both nostrils 2 (two) times daily. 06/11/23  Yes Particia Nearing, PA-C  ALPRAZolam Prudy Feeler) 0.5 MG tablet Take 0.5 mg by mouth 2 (two) times daily as needed for anxiety.  04/30/14   [provider]  amLODipine (NORVASC) 5 MG tablet SMARTSIG:1 Tablet(s) By Mouth Every Evening 07/15/21   [provider]  DULoxetine (CYMBALTA) 30 MG capsule Take 30 mg by mouth daily. 08/21/19   [provider]  estradiol (ESTRACE) 0.5 MG tablet TAKE 1 TABLET BY MOUTH DAILY BEFORE BREAKFAST *REFILL REQUEST* 12/06/22   Adline Potter, NP  ezetimibe (ZETIA) 10 MG tablet Take 10 mg by mouth daily. 02/20/20   [provider]  fluticasone (FLONASE) 50 MCG/ACT nasal spray Place 2 sprays into both nostrils daily. 11/15/22   Leath-Warren, Sadie Haber, NP  gabapentin (NEURONTIN) 100 MG capsule Take 1 capsule (100 mg total) by mouth at bedtime. 01/06/23 02/05/23  Windell Norfolk, MD  irbesartan (AVAPRO) 75 MG tablet Take  300 mg by mouth daily at 2 PM. 07/03/14   [provider]  lamoTRIgine (LAMICTAL) 100 MG tablet Take 1 tablet (100 mg total) by mouth 2 (two) times daily. 01/02/23 12/28/23  Windell Norfolk, MD  LINZESS 145 MCG CAPS capsule TAKE ONE CAPSULE BY MOUTH BEFORE BREAKFAST 12/31/21   Letta Median, PA-C  metoprolol succinate (TOPROL-XL) 25 MG 24 hr tablet Take 25 mg by mouth daily. 09/08/20   [provider]  NEXLIZET 180-10 MG TABS Take 1 tablet by mouth daily.    [provider]  ondansetron (ZOFRAN) 4 MG tablet Take 1 tablet (4 mg total) by mouth every 6 (six) hours. Patient taking differently: Take 4 mg by mouth every 8 (eight) hours as needed for nausea or vomiting. 11/10/19   Wurst, [place], PA-C  oxyCODONE-acetaminophen (PERCOCET/ROXICET) 5-325 MG tablet Take 1 tablet by mouth every 4 (four) hours as needed for severe pain (pain.). 02/12/21   Arman Bogus, MD  pantoprazole (PROTONIX) 40 MG tablet Take 1 tablet (40 mg total) by mouth 2 (two)  times daily. 03/19/20   Letta Median, PA-C  pregabalin (LYRICA) 150 MG capsule Take 150 mg by mouth daily.    [provider]  progesterone (PROMETRIUM) 100 MG capsule TAKE 1 CAPSULE BY MOUTH EVERY DAY FOR 14 DAYS EVERY 3 MONTHS 08/31/22   Adline Potter, NP  Vibegron (GEMTESA) 75 MG TABS Gemtesa 75 mg tablet  Take 1 tablet every day by oral route.    [provider]    Family History Family History  Problem Relation Age of Onset   Throat cancer Father    Heart disease Father    Cancer Father    Hypertension Father    Diabetes Father    Other Mother        esophageal stricture and gallbladder disease   Thyroid disease Mother    Other Sister        esophageal stricture and gallbladder problems.    Diabetes Sister    Hypertension Sister    Thyroid disease Sister    Heart disease Maternal Grandfather    Diabetes Maternal Grandfather    Arthritis Maternal Grandfather    Colon cancer Neg Hx    Liver disease Neg Hx    Pancreatic cancer Neg Hx     Social History Social History   Tobacco Use   Smoking status: Former    Current packs/day: 0.00    Average packs/day: 2.0 packs/day for 9.0 years (18.0 ttl pk-yrs)    Types: Cigarettes    Start date: 04/04/1985    Quit date: 04/04/1994    Years since quitting: 29.2   Smokeless tobacco: Never   Tobacco comments:    smoked 8 years, quit 18 years ago in the 1996  Vaping Use   Vaping status: Never Used  Substance Use Topics   Alcohol use: No    Alcohol/week: 0.0 standard drinks of alcohol   Drug use: No     Allergies   Benadryl [diphenhydramine hcl]   Review of Systems Review of Systems Per HPI  Physical Exam Triage Vital Signs ED Triage Vitals  Encounter Vitals Group     BP 06/11/23 1314 (!) 140/89     Systolic BP Percentile --      Diastolic BP Percentile --      Pulse Rate 06/11/23 1314 (!) 118     Resp 06/11/23 1314 16     Temp 06/11/23 1314 97.9 F (36.6 C)  Temp Source 06/11/23  1314 Oral     SpO2 06/11/23 1314 94 %     Weight --      Height --      Head Circumference --      Peak Flow --      Pain Score 06/11/23 1317 0     Pain Loc --      Pain Education --      Exclude from Growth Chart --    No data found.  Updated Vital Signs BP (!) 140/89 (BP Location: Right Arm)   Pulse (!) 118   Temp 97.9 F (36.6 C) (Oral)   Resp 16   LMP 07/06/2018   SpO2 94%   Visual Acuity Right Eye Distance:   Left Eye Distance:   Bilateral Distance:    Right Eye Near:   Left Eye Near:    Bilateral Near:     Physical Exam Vitals and nursing note reviewed.  Constitutional:      Appearance: Normal appearance.  HENT:     Head: Atraumatic.     Right Ear: Tympanic membrane and external ear normal.     Left Ear: Tympanic membrane and external ear normal.     Nose: Rhinorrhea present.     Mouth/Throat:     Mouth: Mucous membranes are moist.     Pharynx: Posterior oropharyngeal erythema present.  Eyes:     Extraocular Movements: Extraocular movements intact.     Conjunctiva/sclera: Conjunctivae normal.  Cardiovascular:     Rate and Rhythm: Normal rate and regular rhythm.     Heart sounds: Normal heart sounds.  Pulmonary:     Effort: Pulmonary effort is normal.     Breath sounds: Normal breath sounds. No wheezing or rales.  Musculoskeletal:        General: Normal range of motion.     Cervical back: Normal range of motion and neck supple.  Skin:    General: Skin is warm and dry.  Neurological:     Mental Status: She is alert and oriented to person, place, and time.  Psychiatric:        Mood and Affect: Mood normal.        Thought Content: Thought content normal.      UC Treatments / Results  Labs (all labs ordered are listed, but only abnormal results are displayed) Labs Reviewed - No data to display  EKG   Radiology No results found.  Procedures Procedures (including critical care time)  Medications Ordered in UC Medications  dexamethasone  (DECADRON) injection 10 mg (10 mg Intramuscular Given 06/11/23 1417)    Initial Impression / Assessment and Plan / UC Course  I have reviewed the triage vital signs and the nursing notes.  Pertinent labs & imaging results that were available during my care of the patient were reviewed by me and considered in my medical decision making (see chart for details).     Suspect viral respiratory infection.  Mildly tachycardic in triage, otherwise vital signs reassuring.  Oxygen saturation 98% on room air at time of discharge.  Given persisting symptoms we will treat with IM Decadron, Tessalon, Flonase, supportive over-the-counter medications and home care.  Return for worsening symptoms.  Final Clinical Impressions(s) / UC Diagnoses   Final diagnoses:  Viral URI with cough   Discharge Instructions   None    ED Prescriptions     Medication Sig Dispense Auth. Provider   benzonatate (TESSALON) 200 MG capsule Take 1 capsule (200 mg  total) by mouth 3 (three) times daily as needed for cough. 20 capsule Particia Nearing, PA-C   fluticasone [facility]) 50 MCG/ACT nasal spray Place 1 spray into both nostrils 2 (two) times daily. 16 g Particia Nearing, [place]      PDMP not reviewed this encounter.   Particia Nearing, [place] 06/11/23 1441

## 2023-06-12 LAB — LAMOTRIGINE LEVEL: Lamotrigine Lvl: <1 ug/mL — ABNORMAL LOW (ref 2.0–20.0)

## 2023-06-12 NOTE — Discharge Instructions (Signed)
Patient transported to the ER via EMS.

## 2023-06-13 ENCOUNTER — Encounter (HOSPITAL_COMMUNITY): Payer: 59

## 2023-06-14 ENCOUNTER — Telehealth (HOSPITAL_COMMUNITY): Payer: Self-pay

## 2023-06-14 NOTE — Telephone Encounter (Signed)
 Spoke with patient; she did not attend yesterdays appt due to ED visit x 2 for seizure and viral infection.  Patient aware of next appointment 3/18.     1:29 PM, 06/14/23 Nikolus Marczak Small Rilley Poulter MPT [facility] [place] [phone]

## 2023-06-20 ENCOUNTER — Encounter (HOSPITAL_COMMUNITY): Payer: 59

## 2023-06-22 ENCOUNTER — Encounter (HOSPITAL_COMMUNITY): Payer: 59

## 2023-06-27 ENCOUNTER — Telehealth (HOSPITAL_COMMUNITY): Payer: Self-pay

## 2023-06-27 ENCOUNTER — Encounter (HOSPITAL_COMMUNITY): Payer: 59

## 2023-06-27 NOTE — Telephone Encounter (Signed)
 Left message with patient regarding no show of appointment today.  She has recently been sick; asked for a call back and will discharge if we don't hear back from her.    12:03 PM, 06/27/23 Laci Frenkel Small Chandlor Noecker MPT [facility] [place] [phone]

## 2023-07-18 ENCOUNTER — Encounter: Payer: Self-pay | Admitting: Neurology

## 2023-07-18 ENCOUNTER — Ambulatory Visit (INDEPENDENT_AMBULATORY_CARE_PROVIDER_SITE_OTHER): Payer: 59 | Admitting: Neurology

## 2023-07-18 VITALS — BP 121/79 | HR 74 | Ht 66.0 in | Wt 183.0 lb

## 2023-07-18 DIAGNOSIS — Z5181 Encounter for therapeutic drug level monitoring: Secondary | ICD-10-CM | POA: Diagnosis not present

## 2023-07-18 DIAGNOSIS — R4189 Other symptoms and signs involving cognitive functions and awareness: Secondary | ICD-10-CM | POA: Diagnosis not present

## 2023-07-18 DIAGNOSIS — G40909 Epilepsy, unspecified, not intractable, without status epilepticus: Secondary | ICD-10-CM

## 2023-07-18 NOTE — Patient Instructions (Addendum)
 Continue with Lamotrigine 200 mg daily  Will check a Lamotrigine level with BMP  Will also check TSH, B12 level for complaints of memory loss  3 day ambulatory EEG  Referral for formal neuropsychological evaluation for MCI vs. Dementia and to also obtain a baseline cognitive assessment.  Return in 6 months or sooner if worse    There are well-accepted and sensible ways to reduce risk for Alzheimers disease and other degenerative brain disorders .  Exercise Daily Walk A daily 20 minute walk should be part of your routine. Disease related apathy can be a significant roadblock to exercise and the only way to overcome this is to make it a daily routine and perhaps have a reward at the end (something your loved one loves to eat or drink perhaps) or a personal trainer coming to the home can also be very useful. Most importantly, the patient is much more likely to exercise if the caregiver / spouse does it with him/her. In general a structured, repetitive schedule is best.  General Health: Any diseases which effect your body will effect your brain such as a pneumonia, urinary infection, blood clot, heart attack or stroke. Keep contact with your primary care doctor for regular follow ups.  Sleep. A good nights sleep is healthy for the brain. Seven hours is recommended. If you have insomnia or poor sleep habits we can give you some instructions. If you have sleep apnea wear your mask.  Diet: Eating a heart healthy diet is also a good idea; fish and poultry instead of red meat, nuts (mostly non-peanuts), vegetables, fruits, olive oil or canola oil (instead of butter), minimal salt (use other spices to flavor foods), whole grain rice, bread, cereal and pasta and wine in moderation.Research is now showing that the MIND diet, which is a combination of The Mediterranean diet and the DASH diet, is beneficial for cognitive processing and longevity. Information about this diet can be found in The MIND Diet, a book by  Andria Keeler, MS, RDN, and online at [employer]  Finances, Power of [address] and Advance Directives: You should consider putting legal safeguards in place with regard to financial and medical decision making. While the spouse always has power of attorney for medical and financial issues in the absence of any form, you should consider what you want in case the spouse / caregiver is no longer around or capable of making decisions.

## 2023-07-18 NOTE — Progress Notes (Signed)
 [facility]  PATIENT: [name] DOB: 1970-05-23  REQUESTING CLINICIAN: Benjiman Core, MD HISTORY FROM: Patient and girlfriend  REASON FOR VISIT: Establish care for Epilepsy    HISTORICAL  CHIEF COMPLAINT:  Chief Complaint  Patient presents with   Follow-up    Pt in 12, here with Chastity  Pt is here for hospital follow up after seizure. Pt states has not had any more seizures since hospital visit.     INTERVAL HISTORY 07/18/2023:  Patient presents today for follow-up, she is accompanied by Chastity.  Last visit was in September, at that time we continued her on lamotrigine 200 mg daily.  She was doing fine until March 6 when she presented to the hospital with URI symptoms, complaining of fever and malaise.  While in the room she did collapse and have seizure-like activity lasting less than 10 seconds without any injuries.  Her lamotrigine was checked and it was less than 1.  She reports compliance with the medication.  There was only one reported seizure.  Since then, patient is back taking her lamotrigine 200 mg daily, reports compliance with the medication, now her main complaint is forgetfulness, very forgetful, has to be reminded multiple times in order to get things done. Reports that her restless leg symptoms have improved. Her Ferritin was normal.    INTERVAL HISTORY 01/02/2023:  Patient presents today for follow-up, she is accompanied by Chasity, last visit was in April, since then she has not had any seizure or seizure-like activity.  She is compliant with the lamotrigine 200 mg in the morning.  Reports that pregabalin was discontinued by PCP.  Currently she is not on any medication for her restless leg syndrome.  She said that the abnormal sensation and the leg movement is disrupting her sleep.   INTERVAL HISTORY 07/26/2022:  Patient presents today for follow-up, at last visit in January we have discontinued the phenytoin and increase the  lamotrigine to 100 mg twice daily.  She could not tolerate the increase because she did have insomnia at night.  Her lamotrigine was decreased to 150 mg daily.  She reports that she was doing well until last week when she had a seizure at the doctor's office.  She was getting blood drawn, phlebotomist asked her to hold the cotton and applied pressure and when she looked at the blood she passed out.  EMS was not called.  She was observed at the doctor's office for about 30 minutes before going home.  She states she was tired for the next 3 days.  Other than that event she has been doing well.  She reports compliance with the medicine. She stated at night she still have difficulty sleeping on occasion due to restless leg and neuropathy. She was previously put on pregabalin 50 mg nightly but she is not sure of the indication.   INTERVAL HISTORY 04/14/2022:  Patient presents today for follow-up she is accompanied by her girlfriend.  At last visit plan was to start lamotrigine.  Currently she is on lamotrigine 100 mg daily instead of 100 mg twice daily.  She is still on the phenytoin 200 mg twice daily.  She tolerates the medication very well.  Denies any side effect from the medication.  Overall doing well, no complaints, no concerns.   HISTORY OF PRESENT ILLNESS:  This is a 53 year old woman with past medical history hypertension, anxiety, seizure disorder who is presenting to establish care.  Patient reports history of seizures since childhood.  Seizure has been on and off per patient and she is currently on Dilantin 200 mg twice daily, reports being on Dilantin for many years.  She stated that she was on 1 medication in the past but does not remember the name.  She reports sometimes when she is in pain she will have seizures.  Her last seizure was associated with her twisting her left ankle, this was a month ago.  Prior to that her seizure was a month before and not triggered by pain.  She is compliant with  the medication.  She denies any seizure risk factors and denies any major side effect with the Dilantin.  Her seizures are described as generalized convulsion followed by postictal confusion and tiredness.  Girlfriend reports that she sleeps the rest of the day after having a seizure.  No major injury associated with the seizures but she bit her tongue in the past and had urinary incontinence.   Handedness: Right handed   Onset: Since childhood   Seizure Type: Generalized convulsion   Current frequency: 1 a month but can go months without seizures   Any injuries from seizures: Tongue biting  Seizure risk factors: Denies   Previous ASMs: Dilantin and another one that she does not remember the name   Currenty ASMs: Lamotrigine 200 mg daily   ASMs side effects: None   Brain Images: Normal head CT   Previous EEGs: Normal     OTHER MEDICAL CONDITIONS: Hypertension, Anxiety, Seizure  REVIEW OF SYSTEMS: Full 14 system review of systems performed and negative with exception of: As noted in the HPI   ALLERGIES: Allergies  Allergen Reactions   Benadryl [Diphenhydramine Hcl] Rash    HOME MEDICATIONS: Outpatient Medications Prior to Visit  Medication Sig Dispense Refill   ALPRAZolam (XANAX) 0.5 MG tablet Take 0.5 mg by mouth 2 (two) times daily as needed for anxiety.   5   amLODipine (NORVASC) 5 MG tablet SMARTSIG:1 Tablet(s) By Mouth Every Evening     DULoxetine (CYMBALTA) 30 MG capsule Take 30 mg by mouth daily.     estradiol (ESTRACE) 0.5 MG tablet TAKE 1 TABLET BY MOUTH DAILY BEFORE BREAKFAST *REFILL REQUEST* 30 tablet 10   ezetimibe (ZETIA) 10 MG tablet Take 10 mg by mouth daily.     fluticasone (FLONASE) 50 MCG/ACT nasal spray Place 2 sprays into both nostrils daily. 16 g 0   fluticasone (FLONASE) 50 MCG/ACT nasal spray Place 1 spray into both nostrils 2 (two) times daily. 16 g 2   irbesartan (AVAPRO) 75 MG tablet Take 300 mg by mouth daily at 2 PM.  12   lamoTRIgine  (LAMICTAL) 100 MG tablet Take 1 tablet (100 mg total) by mouth 2 (two) times daily. 180 tablet 3   LINZESS 145 MCG CAPS capsule TAKE ONE CAPSULE BY MOUTH BEFORE BREAKFAST 30 capsule 11   metoprolol succinate (TOPROL-XL) 25 MG 24 hr tablet Take 25 mg by mouth daily.     NEXLIZET 180-10 MG TABS Take 1 tablet by mouth daily.     ondansetron (ZOFRAN) 4 MG tablet Take 1 tablet (4 mg total) by mouth every 6 (six) hours. (Patient taking differently: Take 4 mg by mouth every 8 (eight) hours as needed for nausea or vomiting.) 12 tablet 0   oxyCODONE-acetaminophen (PERCOCET/ROXICET) 5-325 MG tablet Take 1 tablet by mouth every 4 (four) hours as needed for severe pain (pain.). 30 tablet 0   pantoprazole (PROTONIX) 40 MG tablet Take 1 tablet (40 mg total) by mouth  2 (two) times daily. 180 tablet 3   progesterone (PROMETRIUM) 100 MG capsule TAKE 1 CAPSULE BY MOUTH EVERY DAY FOR 14 DAYS EVERY 3 MONTHS 14 capsule 6   Vibegron (GEMTESA) 75 MG TABS Gemtesa 75 mg tablet  Take 1 tablet every day by oral route.     benzonatate (TESSALON) 200 MG capsule Take 1 capsule (200 mg total) by mouth 3 (three) times daily as needed for cough. 20 capsule 0   pregabalin (LYRICA) 150 MG capsule Take 150 mg by mouth daily.     gabapentin (NEURONTIN) 100 MG capsule Take 1 capsule (100 mg total) by mouth at bedtime. 30 capsule 0   No facility-administered medications prior to visit.    PAST MEDICAL HISTORY: Past Medical History:  Diagnosis Date   Anxiety    Arthritis    Balance problems    Chronic abdominal pain    Chronic knee pain    Constipation    Difficulty walking    GERD (gastroesophageal reflux disease)    Headache    HTN (hypertension)    Low back pain    Panic attacks    Polyuria    Seizures (HCC)    unknown etiology; on dilantin; last seizures was a few years ago.   Sleep apnea     PAST SURGICAL HISTORY: Past Surgical History:  Procedure Laterality Date   BACK SURGERY     complicated by ureteral  injury   CHONDROPLASTY Right 08/31/2016   Procedure: CHONDROPLASTY RIGHT FEMUR;  Surgeon: Vickki Hearing, MD;  Location: [facility];  Service: Orthopedics;  Laterality: Right;   COLONOSCOPY WITH PROPOFOL N/A 12/19/2019   Procedure: COLONOSCOPY WITH PROPOFOL;  Surgeon: Corbin Ade, Nonbleeding external and internal grade 3 hemorrhoids, three 2-65mm polyps, otherwise normal.  Suspected benign anorectal bleeding.  Pathology revealed hyperplastic polyps.  Repeat colonoscopy in 10 years.   ESOPHAGOGASTRODUODENOSCOPY N/A 09/11/2015   Procedure: ESOPHAGOGASTRODUODENOSCOPY (EGD);  Surgeon: Corbin Ade, MD;  Normal esophagus s/p dilation, few gastric polyps resected and retrieved, normal duodenum. Gastric polyps benign.    ESOPHAGOGASTRODUODENOSCOPY (EGD) WITH ESOPHAGEAL DILATION N/A 03/20/2013   Dr. Rourk:Normal EGD-status post passage of a Maloney dilator/Status post esophageal biopsy., benign path   ESOPHAGOGASTRODUODENOSCOPY (EGD) WITH PROPOFOL N/A 12/19/2019   Procedure: ESOPHAGOGASTRODUODENOSCOPY (EGD) WITH PROPOFOL;  Surgeon: Corbin Ade, MD;  Normal esophagus s/p dilation, benign-appearing fundic gland polyps, normal examined duodenum.   KNEE ARTHROSCOPY WITH MEDIAL MENISECTOMY Right 08/31/2016   Procedure: KNEE ARTHROSCOPY WITH MEDIAL MENISECTOMY;  Surgeon: Vickki Hearing, MD;  Location: [facility];  Service: Orthopedics;  Laterality: Right;   KNEE ARTHROSCOPY WITH MEDIAL MENISECTOMY Right 09/13/2018   Procedure: KNEE ARTHROSCOPY WITH MEDIAL MENISCECTOMY AND MICROFRACTURE;  Surgeon: Vickki Hearing, MD;  Location: [facility];  Service: Orthopedics;  Laterality: Right;   LUMBAR LAMINECTOMY/DECOMPRESSION MICRODISCECTOMY Left 02/12/2021   Procedure: Left Lumbar Five- Sacral One Microdiscectomy;  Surgeon: Tia Alert, MD;  Location[facility];  Service: Neurosurgery;  Laterality: Left;  Left Lumbar Five- Sacral One Microdiscectomy   MALONEY DILATION N/A 09/11/2015   Procedure: Elease Hashimoto DILATION;   Surgeon: Corbin Ade, MD;  Location: [facility];  Service: Endoscopy;  Laterality: N/A;   MALONEY DILATION N/A 12/19/2019   Procedure: Elease Hashimoto DILATION;  Surgeon: Corbin Ade, MD;  Location: [facility];  Service: Endoscopy;  Laterality: N/A;   POLYPECTOMY  12/19/2019   Procedure: POLYPECTOMY;  Surgeon: Corbin Ade, MD;  Location: [facility];  Service: Endoscopy;;  FAMILY HISTORY: Family History  Problem Relation Age of Onset   Throat cancer Father    Heart disease Father    Cancer Father    Hypertension Father    Diabetes Father    Other Mother        esophageal stricture and gallbladder disease   Thyroid disease Mother    Other Sister        esophageal stricture and gallbladder problems.    Diabetes Sister    Hypertension Sister    Thyroid disease Sister    Heart disease Maternal Grandfather    Diabetes Maternal Grandfather    Arthritis Maternal Grandfather    Colon cancer Neg Hx    Liver disease Neg Hx    Pancreatic cancer Neg Hx     SOCIAL HISTORY: Social History   Socioeconomic History   Marital status: Significant Other    Spouse name: Not on file   Number of children: 0   Years of education: 12   Highest education level: Not on file  Occupational History   Occupation: disability    Employer: DISABLED  Tobacco Use   Smoking status: Former    Current packs/day: 0.00    Average packs/day: 2.0 packs/day for 9.0 years (18.0 ttl pk-yrs)    Types: Cigarettes    Start date: 04/04/1985    Quit date: 04/04/1994    Years since quitting: 29.3   Smokeless tobacco: Never   Tobacco comments:    smoked 8 years, quit 18 years ago in the 1996  Vaping Use   Vaping status: Never Used  Substance and Sexual Activity   Alcohol use: No    Alcohol/week: 0.0 standard drinks of alcohol   Drug use: No   Sexual activity: Not Currently    Birth control/protection: Post-menopausal  Other Topics Concern   Not on file  Social History Narrative   Not on file    Social Drivers of Health   Financial Resource Strain: Low Risk  (08/20/2021)   Overall Financial Resource Strain (CARDIA)    Difficulty of Paying Living Expenses: Not hard at all  Food Insecurity: No Food Insecurity (08/20/2021)   Hunger Vital Sign    Worried About Running Out of Food in the Last Year: Never true    Ran Out of Food in the Last Year: Never true  Transportation Needs: No Transportation Needs (08/20/2021)   PRAPARE - Administrator, Civil Service (Medical): No    Lack of Transportation (Non-Medical): No  Physical Activity: Insufficiently Active (08/20/2021)   Exercise Vital Sign    Days of Exercise per Week: 3 days    Minutes of Exercise per Session: 30 min  Stress: No Stress Concern Present (08/20/2021)   [employer] of Occupational Health - Occupational Stress Questionnaire    Feeling of Stress : Only a little  Social Connections: Unknown (08/20/2021)   Social Connection and Isolation Panel [NHANES]    Frequency of Communication with Friends and Family: More than three times a week    Frequency of Social Gatherings with Friends and Family: More than three times a week    Attends Religious Services: 1 to 4 times per year    Active Member of [employer] or Organizations: No    Attends Banker Meetings: Never    Marital Status: Patient declined  Intimate Partner Violence: Not At Risk (08/20/2021)   Humiliation, Afraid, Rape, and Kick questionnaire    Fear of Current or Ex-Partner: No    Emotionally  Abused: No    Physically Abused: No    Sexually Abused: No    PHYSICAL EXAM  GENERAL EXAM/CONSTITUTIONAL: Vitals:  Vitals:   07/18/23 1341  BP: 121/79  Pulse: 74  Weight: 183 lb (83 kg)  Height: 5\' 6"  (1.676 m)   Body mass index is 29.54 kg/m. Wt Readings from Last 3 Encounters:  07/18/23 183 lb (83 kg)  06/08/23 184 lb (83.5 kg)  02/16/23 184 lb 3.2 oz (83.6 kg)   Patient is in no distress; well developed, nourished and groomed; neck  is supple, tearful, appears anxious   MUSCULOSKELETAL: Gait, strength, tone, movements noted in Neurologic exam below  NEUROLOGIC: MENTAL STATUS:      No data to display         awake, alert, oriented to person, place and time recent and remote memory intact normal attention and concentration language fluent, comprehension intact, naming intact fund of knowledge appropriate  CRANIAL NERVE:  2nd, 3rd, 4th, 6th - Visual fields full to confrontation, extraocular muscles intact, no nystagmus 5th - facial sensation symmetric 7th - facial strength symmetric 8th - hearing intact 9th - palate elevates symmetrically, uvula midline 11th - shoulder shrug symmetric 12th - tongue protrusion midline  MOTOR:  normal bulk and tone, full strength in the BUE, BLE  SENSORY:  normal and symmetric to light touch  COORDINATION:  finger-nose-finger, fine finger movements normal  GAIT/STATION:  normal   DIAGNOSTIC DATA (LABS, IMAGING, TESTING) - I reviewed patient records, labs, notes, testing and imaging myself where available.  Lab Results  Component Value Date   WBC 3.9 (L) 06/08/2023   HGB 13.2 06/08/2023   HCT 39.2 06/08/2023   MCV 91.4 06/08/2023   PLT 227 06/08/2023      Component Value Date/Time   NA 134 (L) 06/08/2023 1919   NA 137 01/02/2023 1406   K 3.4 (L) 06/08/2023 1919   CL 100 06/08/2023 1919   CO2 22 06/08/2023 1919   GLUCOSE 126 (H) 06/08/2023 1919   BUN 11 06/08/2023 1919   BUN 9 01/02/2023 1406   CREATININE 0.93 06/08/2023 1919   CALCIUM 8.7 (L) 06/08/2023 1919   PROT 7.2 06/08/2023 1919   ALBUMIN 3.7 06/08/2023 1919   AST 65 (H) 06/08/2023 1919   AST 15 10/19/2013 0000   ALT 73 (H) 06/08/2023 1919   ALKPHOS 44 06/08/2023 1919   ALKPHOS 73 10/19/2013 0000   BILITOT 0.5 06/08/2023 1919   BILITOT 0.3 10/19/2013 0000   GFRNONAA >60 06/08/2023 1919   GFRAA >60 07/29/2019 0830   No results found for: "CHOL", "HDL", "LDLCALC", "LDLDIRECT", "TRIG" No  results found for: "HGBA1C" No results found for: "VITAMINB12" Lab Results  Component Value Date   TSH 1.340 09/12/2018    CT Head 10/07/21 Normal head CT for age.  No explanation for patient's symptoms.   Routine EEG 03/24/22 Normal   ASSESSMENT AND PLAN  53 y.o. year old female  with anxiety, hypertension and seizure disorder who is presenting for follow-up.  She is currently on lamotrigine 200 mg daily, did have a breakthrough seizure on March 6 in the setting of URI and fever.  At that time her lamotrigine level was also undetected.  Since then, patient has been compliant with her lamotrigine 200 mg daily, has not had any additional events.  Her main complaint today is memory loss, being forgetful and has to be reminded multiple times to get things done.  This is bothersome to patient and she is worried  about developing some type of memory loss.  Plan will to obtain an ambulatory EEG to rule out subclinical seizures, we will also send the patient for formal neuropsychological testing.  I will see her in 6 months for follow-up or sooner if worse   1. Nonintractable epilepsy without status epilepticus, unspecified epilepsy type (HCC)   2. Therapeutic drug monitoring   3. Cognitive impairment      Patient Instructions  Continue with Lamotrigine 200 mg daily  Will check a Lamotrigine level with BMP  Will also check TSH, B12 level for complaints of memory loss  3 day ambulatory EEG  Referral for formal neuropsychological evaluation for MCI vs. Dementia and to also obtain a baseline cognitive assessment.  Return in 6 months or sooner if worse      Per   [place] statutes, patients with seizures are not allowed to drive until they have been seizure-free for six months.  Other recommendations include using caution when using heavy equipment or power tools. Avoid working on ladders or at heights. Take showers instead of baths.  Do not swim alone.  Ensure the water temperature is  not too high on the home water heater. Do not go swimming alone. Do not lock yourself in a room alone (i.e. bathroom). When caring for infants or small children, sit down when holding, feeding, or changing them to minimize risk of injury to the child in the event you have a seizure. Maintain good sleep hygiene. Avoid alcohol.  Also recommend adequate sleep, hydration, good diet and minimize stress.   During the Seizure  - First, ensure adequate ventilation and place patients on the floor on their left side  Loosen clothing around the neck and ensure the airway is patent. If the patient is clenching the teeth, do not force the mouth open with any object as this can cause severe damage - Remove all items from the surrounding that can be hazardous. The patient may be oblivious to what's happening and may not even know what he or she is doing. If the patient is confused and wandering, either gently guide him/her away and block access to outside areas - Reassure the individual and be comforting - Call 911. In most cases, the seizure ends before EMS arrives. However, there are cases when seizures may last over 3 to 5 minutes. Or the individual may have developed breathing difficulties or severe injuries. If a pregnant patient or a person with diabetes develops a seizure, it is prudent to call an ambulance. - Finally, if the patient does not regain full consciousness, then call EMS. Most patients will remain confused for about 45 to 90 minutes after a seizure, so you must use judgment in calling for help. - Avoid restraints but make sure the patient is in a bed with padded side rails - Place the individual in a lateral position with the neck slightly flexed; this will help the saliva drain from the mouth and prevent the tongue from falling backward - Remove all nearby furniture and other hazards from the area - Provide verbal assurance as the individual is regaining consciousness - Provide the patient with  privacy if possible - Call for help and start treatment as ordered by the caregiver   After the Seizure (Postictal Stage)  After a seizure, most patients experience confusion, fatigue, muscle pain and/or a headache. Thus, one should permit the individual to sleep. For the next few days, reassurance is essential. Being calm and helping reorient the person is also  of importance.  Most seizures are painless and end spontaneously. Seizures are not harmful to others but can lead to complications such as stress on the lungs, brain and the heart. Individuals with prior lung problems may develop labored breathing and respiratory distress.     Orders Placed This Encounter  Procedures   TSH   Vitamin B12   Lamotrigine level   Basic Metabolic Panel   Ambulatory referral to Neuropsychology   AMBULATORY EEG    No orders of the defined types were placed in this encounter.   Return in about 6 months (around 01/17/2024).    Cassandra Cleveland, MD 07/18/2023, 2:46 PM  [facility] [address], [address] [place], [place] [address] [phone]

## 2023-07-19 ENCOUNTER — Other Ambulatory Visit: Payer: Self-pay | Admitting: Neurology

## 2023-07-19 ENCOUNTER — Telehealth: Payer: Self-pay | Admitting: Neurology

## 2023-07-19 ENCOUNTER — Telehealth: Payer: Self-pay

## 2023-07-19 ENCOUNTER — Encounter: Payer: Self-pay | Admitting: Neurology

## 2023-07-19 LAB — BASIC METABOLIC PANEL WITH GFR
BUN/Creatinine Ratio: 14 (ref 9–23)
BUN: 13 mg/dL (ref 6–24)
CO2: 24 mmol/L (ref 20–29)
Calcium: 10 mg/dL (ref 8.7–10.2)
Chloride: 98 mmol/L (ref 96–106)
Creatinine, Ser: 0.93 mg/dL (ref 0.57–1.00)
Glucose: 89 mg/dL (ref 70–99)
Potassium: 4.5 mmol/L (ref 3.5–5.2)
Sodium: 140 mmol/L (ref 134–144)
eGFR: 74 mL/min/{1.73_m2} (ref >59)

## 2023-07-19 LAB — TSH: TSH: 0.576 u[IU]/mL (ref 0.450–4.500)

## 2023-07-19 LAB — VITAMIN B12: Vitamin B-12: 368 pg/mL (ref 232–1245)

## 2023-07-19 LAB — LAMOTRIGINE LEVEL: Lamotrigine Lvl: 2.5 ug/mL (ref 2.0–20.0)

## 2023-07-19 MED ORDER — VITAMIN B-12 1000 MCG PO TABS: 1000.0000 ug | ORAL_TABLET | Freq: Every day | ORAL | 3 refills | Status: AC

## 2023-07-19 NOTE — Telephone Encounter (Signed)
 Referral for Neuropsychology fax to [facility]. Phone: [phone], Fax: [phone]

## 2023-07-19 NOTE — Telephone Encounter (Signed)
  Form emailed to [facility] at [email or web address]

## 2023-08-15 DIAGNOSIS — G40909 Epilepsy, unspecified, not intractable, without status epilepticus: Secondary | ICD-10-CM | POA: Diagnosis not present

## 2023-08-15 DIAGNOSIS — R4189 Other symptoms and signs involving cognitive functions and awareness: Secondary | ICD-10-CM | POA: Diagnosis not present

## 2023-08-18 DIAGNOSIS — G40909 Epilepsy, unspecified, not intractable, without status epilepticus: Secondary | ICD-10-CM | POA: Diagnosis not present

## 2023-08-23 DIAGNOSIS — H52223 Regular astigmatism, bilateral: Secondary | ICD-10-CM | POA: Diagnosis not present

## 2023-08-23 DIAGNOSIS — E782 Mixed hyperlipidemia: Secondary | ICD-10-CM | POA: Diagnosis not present

## 2023-08-23 DIAGNOSIS — R7303 Prediabetes: Secondary | ICD-10-CM | POA: Diagnosis not present

## 2023-08-24 ENCOUNTER — Encounter: Payer: Self-pay | Admitting: Neurology

## 2023-08-25 NOTE — Telephone Encounter (Signed)
 Hopefully later today.

## 2023-08-27 ENCOUNTER — Encounter (INDEPENDENT_AMBULATORY_CARE_PROVIDER_SITE_OTHER): Admitting: Neurology

## 2023-08-27 ENCOUNTER — Other Ambulatory Visit: Payer: Self-pay | Admitting: Neurology

## 2023-08-27 DIAGNOSIS — G40909 Epilepsy, unspecified, not intractable, without status epilepticus: Secondary | ICD-10-CM

## 2023-08-27 DIAGNOSIS — R4189 Other symptoms and signs involving cognitive functions and awareness: Secondary | ICD-10-CM

## 2023-08-27 NOTE — Procedures (Signed)
 Clinical History : This is a 53 y/o F who presents with a breakthrough seizure on June 08, 2023. Her main complaint is memory loss.  Additional Medications: Prometrium , Gemtsa, Tessalon , Lyrica , Neurontin   INTERMITTENT MONITORING with VIDEO TECHNICAL SUMMARY: This AVEEG was performed using equipment provided by [employer] utilizing Bluetooth ( Track[employer] ) amplifiers with continuous EEGT attended video collection using encrypted remote transmission via [employer] secured cellular tower network with data rates for each AVEEG performed. This is a Therapist, music AVEEG, obtained, according to the 10-20 international electrode placement system, reformatted digitally into referential and bipolar montages. Data was acquired with a minimum of 21 bipolar connections and sampled at a minimum rate of 250 cycles per second per channel, maximum rate of 450 cycles per second per channel and two channels for EKG. The entire VEEG study was recorded through cable and or radio telemetry for subsequent analysis. Specified epochs of the AVEEG data were identified at the direction of the subject by the depression of a push button by the patient. Each patients event file included data acquired two minutes prior to the push button activation and continuing until two minutes afterwards. AVEEG files were reviewed on [facility] server, Licensed Software provided by [employer] with a digital high frequency filter set at 70 Hz and a low frequency filter set at 1 Hz with a paper speed of 4mm/s resulting in 10 seconds per digital page. This entire AVEEG was reviewed by the EEG Technologist. Random time samples, random sleep samples, clips, patient initiated push button files with included patient daily diary logs, EEG Technologist pruned data was reviewed and verified for accuracy and validity by the governing reading neurologist in full details. This AEEGV was fully compliant with all requirements for CPT 97500  for setup, patient education, take down and administered by an EEG technologist. Long-Term EEG with Video was monitored intermittently by a qualified EEG technologist for the entirety of the recording; quality check-ins were performed at a minimum of every two hours, checking and documenting real-time data and video to assure the integrity and quality of the recording (e.g., camera position, electrode integrity and impedance), and identify the need for maintenance. For intermittent monitoring, an EEG Technologist monitored no more than 12 patients concurrently. Diagnostic video was captured at least 80% of the time during the recording.  PATIENT EVENTS: There were no patient events noted or captured during this recording.    TECHNOLOGIST EVENTS: No clear epileptiform activity was detected by the reviewing neurodiagnostic technologist for further review.  TIME SAMPLES: 10-minutes of every 2 hours recorded are reviewed as random time samples.  SLEEP SAMPLES: 5-minutes of every 24 hours recorded are reviewed as random sleep samples.  AWAKE: At maximal level of alertness, the posterior dominant background activity was continuous, reactive, low voltage rhythm of 11 Hz. This was symmetric, well-modulated, and attenuated with eye opening. Diffuse, symmetric, frontocentral beta range activity was present  SLEEP: N1 Sleep (Stage 1) was observed and characterized by the disappearance of alpha rhythm and the appearance of vertex activity.  N2 Sleep (Stage 2) was observed and characterized by vertex waves, K-complexes, and sleep spindles.  N3 (Stage 3) sleep was observed and characterized by high amplitude Delta activity of 20%.  REM sleep was observed.  EKG: There were no arrhythmias or abnormalities noted during this recording.  Impression: Normal EEG: awake and asleep  Clinical Correlation: This is a normal 3-day ambulatory EEG tracing. No focal abnormalities or epileptiform discharges were  seen. There  were no electrographic seizures noted. No events were captured during the recording. Please note a normal EEG does not exclude the diagnosis of epilepsy.    Anajah Sterbenz, MD [facility]

## 2023-08-27 NOTE — Procedures (Signed)
    CLINICAL HISTORY: This is a 53 y/o F who presents with a breakthrough seizure on June 08, 2023. Her main complaint is memory loss.  Additional Medications: Prometrium , Gemtsa, Tessalon , Lyrica , Neurontin   MEDICATIONS: Lamotrigine , Xanax, Norvasc , Duloxetine , Estrace , Zetia , Flonase , Avapro , Linzess , Torprol, Nexizet, Zofran   TECHNICAL SUMMARY: Electrodes are applied using the standard 10-20 international placement protocol. This ambulatory EEG recording was performed utilizing 23 electrode inputs including: (19) cephalic, (1) ground, (1) system reference, and (2) EKG. The EEG recording can be visualized in all standard types of montages for review. The recordings are done at a sampling rate of 200 samples per second, per channel, allowing for relatively high frequency responses of 70 Hz. EEG playbacks are done with digital high frequency filters noted at the top of the page. The EEG is notated with patient events at the direction of the patient by pressing a push button mounted on a waist worn EEG recording device. This EEG was also recorded using high definition digital video.  TECHNOLOGIST NOTE: No skull defect or scalp edemas were present.  BACKGROUND: The record was obtained in the awake and drowsy state. No sleep was able to be captured. The posterior awake dominant background activity was a continuous, reactive rhythm of 11 Hz. This was symmetric and well-modulated and attenuated with eye opening. Symmetric diffuse frontocentral beta range activity was present.  SIGNIFICANT FINDINGS: There were no apparent asymmetries, focal or generalized abnormalities noted. There were no apparent electrographic or clinical seizures noted by the reviewing neurodiagnostic technologist.  PATIENT EVENTS: There were no patient events noted or captured during this recording.  HYPERVENTILATION: Hyperventilation was not performed.  PHOTIC STIMULATION: IPS Was not performed.  EKG: There were no  arrhythmias or abnormalities noted during this recording.  Impression: This is a normal routine EEG study. No epileptiform activity is recorded in the tracing. Clinical Correlation is recommended.  Larya Charpentier, MD [facility]

## 2023-08-28 ENCOUNTER — Ambulatory Visit: Payer: Self-pay | Admitting: Neurology

## 2023-08-31 DIAGNOSIS — G2581 Restless legs syndrome: Secondary | ICD-10-CM | POA: Diagnosis not present

## 2023-08-31 DIAGNOSIS — G4700 Insomnia, unspecified: Secondary | ICD-10-CM | POA: Diagnosis not present

## 2023-08-31 DIAGNOSIS — I10 Essential (primary) hypertension: Secondary | ICD-10-CM | POA: Diagnosis not present

## 2023-08-31 DIAGNOSIS — G8929 Other chronic pain: Secondary | ICD-10-CM | POA: Diagnosis not present

## 2023-08-31 DIAGNOSIS — N289 Disorder of kidney and ureter, unspecified: Secondary | ICD-10-CM | POA: Insufficient documentation

## 2023-08-31 DIAGNOSIS — K219 Gastro-esophageal reflux disease without esophagitis: Secondary | ICD-10-CM | POA: Diagnosis not present

## 2023-08-31 DIAGNOSIS — N3281 Overactive bladder: Secondary | ICD-10-CM | POA: Diagnosis not present

## 2023-08-31 DIAGNOSIS — E782 Mixed hyperlipidemia: Secondary | ICD-10-CM | POA: Diagnosis not present

## 2023-09-18 ENCOUNTER — Other Ambulatory Visit (HOSPITAL_COMMUNITY): Payer: Self-pay | Admitting: Internal Medicine

## 2023-09-18 DIAGNOSIS — Z1231 Encounter for screening mammogram for malignant neoplasm of breast: Secondary | ICD-10-CM

## 2023-09-27 ENCOUNTER — Ambulatory Visit (HOSPITAL_COMMUNITY)

## 2023-10-13 ENCOUNTER — Other Ambulatory Visit (HOSPITAL_COMMUNITY): Payer: Self-pay | Admitting: Family Medicine

## 2023-10-13 DIAGNOSIS — M542 Cervicalgia: Secondary | ICD-10-CM

## 2023-10-13 DIAGNOSIS — K5900 Constipation, unspecified: Secondary | ICD-10-CM | POA: Diagnosis not present

## 2023-10-13 DIAGNOSIS — R202 Paresthesia of skin: Secondary | ICD-10-CM | POA: Diagnosis not present

## 2023-10-13 DIAGNOSIS — Z79899 Other long term (current) drug therapy: Secondary | ICD-10-CM | POA: Diagnosis not present

## 2023-10-13 DIAGNOSIS — R200 Anesthesia of skin: Secondary | ICD-10-CM | POA: Diagnosis not present

## 2023-10-16 ENCOUNTER — Ambulatory Visit (HOSPITAL_COMMUNITY)
Admission: RE | Admit: 2023-10-16 | Discharge: 2023-10-16 | Disposition: A | Discharge status: Home / Self Care | Source: Ambulatory Visit | Attending: Family Medicine | Admitting: Family Medicine

## 2023-10-16 ENCOUNTER — Ambulatory Visit: Admitting: Adult Health

## 2023-10-16 ENCOUNTER — Encounter: Payer: Self-pay | Admitting: Adult Health

## 2023-10-16 VITALS — BP 111/75 | HR 86 | Ht 66.0 in | Wt 181.5 lb

## 2023-10-16 DIAGNOSIS — Z79890 Hormone replacement therapy (postmenopausal): Secondary | ICD-10-CM

## 2023-10-16 DIAGNOSIS — R61 Generalized hyperhidrosis: Secondary | ICD-10-CM

## 2023-10-16 DIAGNOSIS — M542 Cervicalgia: Secondary | ICD-10-CM | POA: Insufficient documentation

## 2023-10-16 DIAGNOSIS — G479 Sleep disorder, unspecified: Secondary | ICD-10-CM | POA: Diagnosis not present

## 2023-10-16 DIAGNOSIS — M47812 Spondylosis without myelopathy or radiculopathy, cervical region: Secondary | ICD-10-CM | POA: Diagnosis not present

## 2023-10-16 DIAGNOSIS — R232 Flushing: Secondary | ICD-10-CM | POA: Diagnosis not present

## 2023-10-16 DIAGNOSIS — M5030 Other cervical disc degeneration, unspecified cervical region: Secondary | ICD-10-CM | POA: Diagnosis not present

## 2023-10-16 DIAGNOSIS — M4802 Spinal stenosis, cervical region: Secondary | ICD-10-CM | POA: Diagnosis not present

## 2023-10-16 MED ORDER — ESTRADIOL 1 MG PO TABS: 1.0000 mg | ORAL_TABLET | Freq: Every day | ORAL | 3 refills | Status: DC

## 2023-10-16 MED ORDER — PROGESTERONE 200 MG PO CAPS: 200.0000 mg | ORAL_CAPSULE | Freq: Every day | ORAL | 3 refills | Status: DC

## 2023-10-16 NOTE — Progress Notes (Signed)
  Subjective:     Patient ID: [name], female   DOB: 19-Apr-1970, 53 y.o.   MRN: [id number]  HPI [name] is a 53 year old white female, with SO, PM in complaining of hot flashes and night sweats and not sleeping well. She is on HRT.     Component Value Date/Time   DIAGPAP  08/20/2021 1304    - Negative for intraepithelial lesion or malignancy (NILM)   HPVHIGH Negative 08/20/2021 1304   ADEQPAP  08/20/2021 1304    Satisfactory for evaluation; transformation zone component ABSENT.   PCP is Dr Shona.   Review of Systems +hot flashes  + night sweat +not sleeping well Denies any vaginal bleeding  Reviewed past medical,surgical, social and family history. Reviewed medications and allergies.     Objective:   Physical Exam BP 111/75 (BP Location: Left Arm, Patient Position: Sitting, Cuff Size: Normal)   Pulse 86   Ht 5' 6 (1.676 m)   Wt 181 lb 8 oz (82.3 kg)   LMP 07/06/2018   BMI 29.29 kg/m     Skin warm and dry. Lungs: clear to ausculation bilaterally. Cardiovascular: regular rate and rhythm.  Fall risk is low  Upstream - 10/16/23 1401       Pregnancy Intention Screening   Does the patient want to become pregnant in the next year? N/A    Does the patient's partner want to become pregnant in the next year? N/A    Would the patient like to discuss contraceptive options today? N/A      Contraception Wrap Up   Current Method Abstinence   PM   End Method Abstinence   PM   Contraception Counseling Provided No          Assessment:     1. Hot flashes (Primary) +hot flashes   2. Night sweats +night sweats   3. Sleep disturbance Not sleeping well   4. Hormone replacement therapy (HRT) Will increase estrace  to 1 mg daily and Prometrium  200 mg 1 daily at bedtime Meds ordered this encounter  Medications   estradiol  (ESTRACE ) 1 MG tablet    Sig: Take 1 tablet (1 mg total) by mouth daily.    Dispense:  30 tablet    Refill:  3    Supervising Provider:   JAYNE MINDER H [[id number]]   progesterone  (PROMETRIUM ) 200 MG capsule    Sig: Take 1 capsule (200 mg total) by mouth daily. At bedtime    Dispense:  30 capsule    Refill:  3    Supervising Provider:   JAYNE MINDER DEL [[id number]]       Plan:     Follow up as scheduled 12/08/23 for pap and physical

## 2023-10-26 DIAGNOSIS — M6281 Muscle weakness (generalized): Secondary | ICD-10-CM | POA: Diagnosis not present

## 2023-10-26 DIAGNOSIS — R293 Abnormal posture: Secondary | ICD-10-CM | POA: Diagnosis not present

## 2023-10-26 DIAGNOSIS — M2560 Stiffness of unspecified joint, not elsewhere classified: Secondary | ICD-10-CM | POA: Diagnosis not present

## 2023-10-26 DIAGNOSIS — M542 Cervicalgia: Secondary | ICD-10-CM | POA: Diagnosis not present

## 2023-10-31 DIAGNOSIS — M6281 Muscle weakness (generalized): Secondary | ICD-10-CM | POA: Diagnosis not present

## 2023-10-31 DIAGNOSIS — M2560 Stiffness of unspecified joint, not elsewhere classified: Secondary | ICD-10-CM | POA: Diagnosis not present

## 2023-10-31 DIAGNOSIS — M542 Cervicalgia: Secondary | ICD-10-CM | POA: Diagnosis not present

## 2023-10-31 DIAGNOSIS — R293 Abnormal posture: Secondary | ICD-10-CM | POA: Diagnosis not present

## 2023-11-30 DIAGNOSIS — I10 Essential (primary) hypertension: Secondary | ICD-10-CM | POA: Diagnosis not present

## 2023-11-30 DIAGNOSIS — G2581 Restless legs syndrome: Secondary | ICD-10-CM | POA: Diagnosis not present

## 2023-11-30 DIAGNOSIS — G8929 Other chronic pain: Secondary | ICD-10-CM | POA: Diagnosis not present

## 2023-11-30 DIAGNOSIS — G4700 Insomnia, unspecified: Secondary | ICD-10-CM | POA: Diagnosis not present

## 2023-11-30 DIAGNOSIS — K219 Gastro-esophageal reflux disease without esophagitis: Secondary | ICD-10-CM | POA: Diagnosis not present

## 2023-11-30 DIAGNOSIS — N3281 Overactive bladder: Secondary | ICD-10-CM | POA: Diagnosis not present

## 2023-11-30 DIAGNOSIS — M79643 Pain in unspecified hand: Secondary | ICD-10-CM | POA: Diagnosis not present

## 2023-12-08 ENCOUNTER — Ambulatory Visit: Admitting: Adult Health

## 2023-12-08 ENCOUNTER — Encounter: Payer: Self-pay | Admitting: Adult Health

## 2023-12-08 ENCOUNTER — Other Ambulatory Visit (HOSPITAL_COMMUNITY): Payer: Self-pay | Admitting: Internal Medicine

## 2023-12-08 VITALS — BP 129/86 | HR 76 | Ht 66.0 in | Wt 182.5 lb

## 2023-12-08 DIAGNOSIS — Z79890 Hormone replacement therapy (postmenopausal): Secondary | ICD-10-CM | POA: Diagnosis not present

## 2023-12-08 DIAGNOSIS — Z01419 Encounter for gynecological examination (general) (routine) without abnormal findings: Secondary | ICD-10-CM

## 2023-12-08 DIAGNOSIS — N950 Postmenopausal bleeding: Secondary | ICD-10-CM

## 2023-12-08 DIAGNOSIS — Z1231 Encounter for screening mammogram for malignant neoplasm of breast: Secondary | ICD-10-CM

## 2023-12-08 MED ORDER — ESTRADIOL 1 MG PO TABS: 1.0000 mg | ORAL_TABLET | Freq: Every day | ORAL | 3 refills | Status: DC

## 2023-12-08 MED ORDER — PROGESTERONE 200 MG PO CAPS: 200.0000 mg | ORAL_CAPSULE | Freq: Every day | ORAL | 3 refills | Status: AC

## 2023-12-08 NOTE — Progress Notes (Signed)
 Patient ID: [name], female   DOB: 1970-12-02, 53 y.o.   MRN: [id number] History of Present Illness:  [name] is a 53 year old white female, with SO, PM in for a well woman gyn exam. She is on HRT and had red bleeding about 2 weeks ago. She is not having hot flashes now, but gets hot.     Component Value Date/Time   DIAGPAP  08/20/2021 1304    - Negative for intraepithelial lesion or malignancy (NILM)   HPVHIGH Negative 08/20/2021 1304   ADEQPAP  08/20/2021 1304    Satisfactory for evaluation; transformation zone component ABSENT.    PCP is Dr Shona Current Medications, Allergies, Past Medical History, Past Surgical History, Family History and Social History were reviewed in [employer] electronic medical record.     Review of Systems: Patient denies any headaches, hearing loss, fatigue, blurred vision, shortness of breath, chest pain, abdominal pain, problems with bowel movements, urination, or intercourse(not active). No joint pain or mood swings.  See HPI for positives    Physical Exam:BP 129/86 (BP Location: Left Arm, Patient Position: Sitting, Cuff Size: Normal)   Pulse 76   Ht 5' 6 (1.676 m)   Wt 182 lb 8 oz (82.8 kg)   LMP 07/06/2018   BMI 29.46 kg/m   General:  Well developed, well nourished, no acute distress Skin:  Warm and dry Neck:  Midline trachea, normal thyroid , good ROM, no lymphadenopathy Lungs; Clear to auscultation bilaterally Breast:  No dominant palpable mass, retraction, or nipple discharge Cardiovascular: Regular rate and rhythm Abdomen:  Soft, non tender, no hepatosplenomegaly Pelvic:  External genitalia is normal in appearance, has skin tag right inner thigh.  The vagina is normal in appearance. Urethra has no lesions or masses. The cervix is smooth.  Uterus is felt to be normal size, shape, and contour.  No adnexal masses or tenderness noted.Bladder is non tender, no masses felt. Rectal:Deferred  Extremities/musculoskeletal:  No swelling  or varicosities noted, no clubbing or cyanosis Psych:  No mood changes, alert and cooperative,seems happy AA is 0 Fall risk is low    12/08/2023   10:20 AM 08/20/2021   12:22 PM  Depression screen PHQ 2/9  Decreased Interest 0 0  Down, Depressed, Hopeless 0 1  PHQ - 2 Score 0 1  Altered sleeping 1 0  Tired, decreased energy 1 1  Change in appetite 0 1  Feeling bad or failure about yourself  0 0  Trouble concentrating 1 0  Moving slowly or fidgety/restless 0 0  Suicidal thoughts 0 0  PHQ-9 Score 3 3       12/08/2023   10:20 AM 08/20/2021   12:22 PM  GAD 7 : Generalized Anxiety Score  Nervous, Anxious, on Edge 1 0  Control/stop worrying 0 1  Worry too much - different things 1 1  Trouble relaxing 1 1  Restless 0 0  Easily annoyed or irritable 1 0  Afraid - awful might happen 0 0  Total GAD 7 Score 4 3      Upstream - 12/08/23 1026       Pregnancy Intention Screening   Does the patient want to become pregnant in the next year? N/A    Does the patient's partner want to become pregnant in the next year? N/A    Would the patient like to discuss contraceptive options today? N/A      Contraception Wrap Up   Current Method Abstinence   PM  End Method Abstinence   PM   Contraception Counseling Provided No         Examination chaperoned by Clarita Salt LPN   Impression and plan: 1. Encounter for well woman exam with routine gynecological exam (Primary) Physical and pap in 1 year Mammogram is scheduled for 12/15/23 Colonoscopy per GI Labs with PCP Talk with PCP about just being hot, may be related to Cymbalta    2. Hormone replacement therapy (HRT) Happy with HRT will refill Meds ordered this encounter  Medications   estradiol  (ESTRACE ) 1 MG tablet    Sig: Take 1 tablet (1 mg total) by mouth daily.    Dispense:  90 tablet    Refill:  3    Supervising Provider:   JAYNE MINDER H [[id number]]   progesterone  (PROMETRIUM ) 200 MG capsule    Sig: Take 1 capsule (200 mg total)  by mouth daily. At bedtime    Dispense:  90 capsule    Refill:  3    Supervising Provider:   JAYNE MINDER H [[id number]]     3. PMB (postmenopausal bleeding) Bright red bleeding about 2 weeks ago Will get pelvic US  in office to assess uterine lining, about 01/09/24 - US  PELVIC COMPLETE WITH TRANSVAGINAL; Future

## 2023-12-11 ENCOUNTER — Ambulatory Visit (INDEPENDENT_AMBULATORY_CARE_PROVIDER_SITE_OTHER): Admitting: Orthopedic Surgery

## 2023-12-11 ENCOUNTER — Encounter: Payer: Self-pay | Admitting: Orthopedic Surgery

## 2023-12-11 VITALS — BP 121/83 | HR 75 | Ht 66.0 in | Wt 181.0 lb

## 2023-12-11 DIAGNOSIS — M65319 Trigger thumb, unspecified thumb: Secondary | ICD-10-CM

## 2023-12-11 DIAGNOSIS — M65842 Other synovitis and tenosynovitis, left hand: Secondary | ICD-10-CM | POA: Diagnosis not present

## 2023-12-11 DIAGNOSIS — M65311 Trigger thumb, right thumb: Secondary | ICD-10-CM | POA: Diagnosis not present

## 2023-12-11 MED ORDER — PREDNISONE 10 MG PO TABS: 10.0000 mg | ORAL_TABLET | Freq: Three times a day (TID) | ORAL | 0 refills | Status: DC

## 2023-12-11 MED ORDER — METHYLPREDNISOLONE ACETATE 40 MG/ML IJ SUSP
40.0000 mg | Freq: Once | INTRAMUSCULAR | Status: AC
  Administered 2023-12-11: 40 mg via INTRA_ARTICULAR

## 2023-12-11 NOTE — Progress Notes (Signed)
  Intake history:  Chief Complaint  Patient presents with   Hand Pain    Right>Left No known injury, when thumb is bent I have shooting pain up a little past the wrist this all started - weeks ago     BP 121/83   Pulse 75   Ht 5' 6 (1.676 m)   Wt 181 lb (82.1 kg)   LMP 07/06/2018   BMI 29.21 kg/m  Body mass index is 29.21 kg/m.    WHAT ARE WE SEEING YOU FOR TODAY?   bilateral hand(s)  How Galileo Colello has this bothered you? (DOI?DOS?WS?)  2-3 week(s) ago  Was there an injury? No  Anticoag.  No  Diabetes No  Heart disease No  Hypertension Yes  SMOKING HX No  Kidney disease No  Any ALLERGIES ______________________________________________   Treatment:  Have you taken:  Tylenol  No  Advil  No  Had PT No  Had injection No  Other  _________heat, ice and biofreeze________________

## 2023-12-11 NOTE — Progress Notes (Signed)
 Patient: [name]           Date of Birth: [birth year]           MRN: [id number] Visit Date: 12/11/2023 Requested by: Shona Norleen PEDLAR, MD [address] [place],  [place] [address] PCP: Shona Norleen PEDLAR, MD   Chief Complaint  Patient presents with   Hand Pain    Right>Left No known injury, when thumb is bent I have shooting pain up a little past the wrist this all started - weeks ago   Encounter Diagnoses  Name Primary?   Stenosing tenosynovitis of thumb right thumb Yes   Stenosing tenosynovitis of finger of left hand left long and ring     Plan:  Injection and oral medication  Meds ordered this encounter  Medications   predniSONE  (DELTASONE ) 10 MG tablet    Sig: Take 1 tablet (10 mg total) by mouth 3 (three) times daily.    Dispense:  42 tablet    Refill:  0     Chief Complaint  Patient presents with   Hand Pain    Right>Left No known injury, when thumb is bent I have shooting pain up a little past the wrist this all started - weeks ago    Hand Pain     Right thumb will not bend at the IP joint pain over the A1 pulley pain radiates up the volar aspect of the hand into the forearm  Left ring and long finger tenderness over the A1 pulley occasional locking  Body mass index is 29.21 kg/m.   Problem list, medical hx, medications and allergies reviewed      Allergies  Allergen Reactions   Benadryl [Diphenhydramine Hcl] Rash    BP 121/83   Pulse 75   Ht 5' 6 (1.676 m)   Wt 181 lb (82.1 kg)   LMP 07/06/2018   BMI 29.21 kg/m    Physical exam: Physical Exam Vitals and nursing note reviewed.  Constitutional:      Appearance: Normal appearance.  HENT:     Head: Normocephalic and atraumatic.  Eyes:     General: No scleral icterus.       Right eye: No discharge.        Left eye: No discharge.     Extraocular Movements: Extraocular movements intact.     Conjunctiva/sclera: Conjunctivae normal.     Pupils: Pupils are equal, round, and reactive to  light.  Cardiovascular:     Rate and Rhythm: Normal rate.     Pulses: Normal pulses.  Skin:    General: Skin is warm and dry.     Capillary Refill: Capillary refill takes less than 2 seconds.  Neurological:     General: No focal deficit present.     Mental Status: She is alert and oriented to person, place, and time.  Psychiatric:        Mood and Affect: Mood normal.        Behavior: Behavior normal.        Thought Content: Thought content normal.        Judgment: Judgment normal.     Ortho Exam  MSK:  Tenderness over the A1 pulley of the right thumb she has a significant loss of motion of the IP joint  On the left side tenderness over the A1 pulley of the long and ring finger but no locking or catching on range of motion  Data reviewed:   Image(s) reviewed with personal interpretation:  No images indicated  Assessment and plan:  Encounter Diagnoses  Name Primary?   Stenosing tenosynovitis of thumb right thumb Yes   Stenosing tenosynovitis of finger of left hand left long and ring     Recommend injection right thumb and then anti-inflammatories left hand   Meds ordered this encounter  Medications   predniSONE  (DELTASONE ) 10 MG tablet    Sig: Take 1 tablet (10 mg total) by mouth 3 (three) times daily.    Dispense:  42 tablet    Refill:  0    Procedures:   Injection right thumb for trigger finger  Right Trigger thumb injection Medication  1 mL of 40 mg Depo-Medrol   2 mL of 1% lidocaine  plain  Ethyl chloride for anesthesia  Verbal consent was obtained timeout was taken to confirm the injection site as right thumb  Alcohol was used to prepare the skin along with ethyl chloride and then the injection was made at the A1 pulley there were no complications   Prednisone  10 mg 3 times daily

## 2023-12-11 NOTE — Addendum Note (Signed)
 Addended byBETHA JENEAN GREIG LELON on: 12/11/2023 11:56 AM   Modules accepted: Orders

## 2023-12-15 ENCOUNTER — Encounter (HOSPITAL_COMMUNITY)

## 2023-12-15 DIAGNOSIS — Z1231 Encounter for screening mammogram for malignant neoplasm of breast: Secondary | ICD-10-CM

## 2023-12-25 ENCOUNTER — Ambulatory Visit: Admitting: Orthopedic Surgery

## 2024-01-01 ENCOUNTER — Encounter (HOSPITAL_COMMUNITY): Payer: Self-pay | Admitting: Internal Medicine

## 2024-01-09 ENCOUNTER — Other Ambulatory Visit

## 2024-01-15 ENCOUNTER — Ambulatory Visit (INDEPENDENT_AMBULATORY_CARE_PROVIDER_SITE_OTHER)

## 2024-01-15 DIAGNOSIS — N8500 Endometrial hyperplasia, unspecified: Secondary | ICD-10-CM | POA: Diagnosis not present

## 2024-01-15 DIAGNOSIS — N950 Postmenopausal bleeding: Secondary | ICD-10-CM

## 2024-01-15 NOTE — Progress Notes (Signed)
 PELVIC US  TA/TV:atrophic homogeneous anteverted uterus,WNL,avascular homogeneous thickened endometrium 7.8 mm,normal ovaries,ovaries appear mobile,no free fluid,no pain during ultrasound  Chaperone Peggy

## 2024-01-16 ENCOUNTER — Ambulatory Visit: Payer: Self-pay | Admitting: Adult Health

## 2024-01-16 DIAGNOSIS — R9389 Abnormal findings on diagnostic imaging of other specified body structures: Secondary | ICD-10-CM | POA: Insufficient documentation

## 2024-01-16 NOTE — Telephone Encounter (Signed)
 Pt aware of US  will get EMB

## 2024-01-18 ENCOUNTER — Ambulatory Visit
Admission: EM | Admit: 2024-01-18 | Discharge: 2024-01-18 | Disposition: A | Discharge status: Home / Self Care | Attending: Nurse Practitioner | Admitting: Nurse Practitioner

## 2024-01-18 ENCOUNTER — Encounter: Payer: Self-pay | Admitting: Emergency Medicine

## 2024-01-18 DIAGNOSIS — J22 Unspecified acute lower respiratory infection: Secondary | ICD-10-CM | POA: Diagnosis not present

## 2024-01-18 DIAGNOSIS — R059 Cough, unspecified: Secondary | ICD-10-CM | POA: Diagnosis not present

## 2024-01-18 MED ORDER — PROMETHAZINE-DM 6.25-15 MG/5ML PO SYRP: 5.0000 mL | ORAL_SOLUTION | Freq: Four times a day (QID) | ORAL | 0 refills | Status: DC | PRN

## 2024-01-18 MED ORDER — CETIRIZINE HCL 10 MG PO TABS: 10.0000 mg | ORAL_TABLET | Freq: Every day | ORAL | 0 refills | Status: AC

## 2024-01-18 MED ORDER — AZITHROMYCIN 250 MG PO TABS: 250.0000 mg | ORAL_TABLET | Freq: Every day | ORAL | 0 refills | Status: DC

## 2024-01-18 NOTE — Discharge Instructions (Addendum)
 Take medication as prescribed. Increase fluids and allow for plenty of rest. You may take over-the-counter Tylenol  as needed for pain, fever, or general discomfort. Recommend use of normal saline nasal spray throughout the day for nasal congestion and runny nose. For your cough, it will be helpful to use the minified air in your bedroom at nighttime during sleep and to sleep elevated on pillows while symptoms persist. As discussed, if symptoms fail to improve with this treatment, you may follow-up in this clinic or with your primary care physician for further evaluation. Follow-up as needed.

## 2024-01-18 NOTE — ED Triage Notes (Signed)
 Cough, nasal congestion, x 1 week.  Has been taking mucinex, nyquil and tylenol .

## 2024-01-18 NOTE — ED Provider Notes (Signed)
 [facility]    CSN: [id number] Arrival date & time: 01/18/24  1353      History   Chief Complaint Chief Complaint  Patient presents with   cough, nasal congestion    HPI [name] is a 53 y.o. female.   The history is provided by the patient.   The patient presents for a several day history of cough and nasal congestion. The patient denies fever, chills, headache, ear pain, difficulty breathing, chest pain, abdominal pain, nausea, vomiting, diarrhea, or rash.  Patient states she has been taking several over-the-counter cough and cold medications along with her current allergy medications with minimal relief of her symptoms.  Past Medical History:  Diagnosis Date   Anxiety    Arthritis    Balance problems    Chronic abdominal pain    Chronic knee pain    Constipation    Difficulty walking    GERD (gastroesophageal reflux disease)    Headache    HTN (hypertension)    Low back pain    Panic attacks    Polyuria    Seizures (HCC)    unknown etiology; on dilantin ; last seizures was a few years ago.   Sleep apnea     Patient Active Problem List   Diagnosis Date Noted   Thickened endometrium 01/16/2024   PMB (postmenopausal bleeding) 12/08/2023   Encounter for well woman exam with routine gynecological exam 12/08/2023   Sleep disturbance 10/16/2023   Night sweats 10/16/2023   Hot flashes 10/16/2023   Abnormal renal function 08/31/2023   Bipolar disorder (HCC) 03/13/2023   Cramps of lower extremity 03/01/2023   Restless legs 01/12/2023   Hot flash not due to menopause 12/01/2022   Actinic keratosis 08/31/2022   Hyperkalemia 03/02/2022   Major depressive disorder 03/02/2022   Mass of left breast 02/16/2022   Acute conjunctivitis of left eye 01/12/2022   Pain of right thumb 11/03/2021   Headache 10/07/2021   Chest pain 10/07/2021   Prolapsed internal hemorrhoids, grade 2 09/02/2021   Fecal occult blood test positive 08/20/2021   Encounter  for screening fecal occult blood testing 08/20/2021   Encounter for gynecological examination with Papanicolaou smear of cervix 08/20/2021   Hormone replacement therapy (HRT) 08/20/2021   Hemorrhoids 03/24/2021   Chronic pain 03/17/2021   Lumbar radiculopathy 10/02/2020   Lumbar pain 09/29/2020   Essential hypertension 08/27/2020   Rectal bleeding 08/12/2019   S/P right knee arthroscopy *with microfracture 09/13/18 09/18/2018   Acute medial meniscus tear of right knee    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    Derangement of posterior horn of medial meniscus of right knee    Primary osteoarthritis of right knee    Gastric polyp    Gastroesophageal reflux disease 08/24/2015   Hypoactive sexual desire disorder 05/14/2014   Abdominal pain, chronic, epigastric 12/08/2013   Constipation 12/08/2013   Esophageal dysphagia 02/22/2013   Abdominal pain, epigastric 02/22/2013   LUQ pain 02/22/2013   Difficulty walking 01/25/2012   Stiffness of joint, not elsewhere classified, ankle and foot 01/25/2012   Balance problems 01/25/2012   Ankle sprain 01/17/2012   Ankle pain, right 01/17/2012    Past Surgical History:  Procedure Laterality Date   BACK SURGERY     complicated by ureteral injury   CHONDROPLASTY Right 08/31/2016   Procedure: CHONDROPLASTY RIGHT FEMUR;  Surgeon: Margrette Taft BRAVO, MD;  Location: [facility];  Service: Orthopedics;  Laterality: Right;   COLONOSCOPY WITH PROPOFOL  N/A 12/19/2019  Procedure: COLONOSCOPY WITH PROPOFOL ;  Surgeon: Shaaron Lamar HERO, Nonbleeding external and internal grade 3 hemorrhoids, three 2-78mm polyps, otherwise normal.  Suspected benign anorectal bleeding.  Pathology revealed hyperplastic polyps.  Repeat colonoscopy in 10 years.   ESOPHAGOGASTRODUODENOSCOPY N/A 09/11/2015   Procedure: ESOPHAGOGASTRODUODENOSCOPY (EGD);  Surgeon: Lamar HERO Shaaron, MD;  Normal esophagus s/p dilation, few gastric polyps resected and retrieved, normal duodenum. Gastric polyps  benign.    ESOPHAGOGASTRODUODENOSCOPY (EGD) WITH ESOPHAGEAL DILATION N/A 03/20/2013   Dr. Rourk:Normal EGD-status post passage of a Maloney dilator/Status post esophageal biopsy., benign path   ESOPHAGOGASTRODUODENOSCOPY (EGD) WITH PROPOFOL  N/A 12/19/2019   Procedure: ESOPHAGOGASTRODUODENOSCOPY (EGD) WITH PROPOFOL ;  Surgeon: Shaaron Lamar HERO, MD;  Normal esophagus s/p dilation, benign-appearing fundic gland polyps, normal examined duodenum.   KNEE ARTHROSCOPY WITH MEDIAL MENISECTOMY Right 08/31/2016   Procedure: KNEE ARTHROSCOPY WITH MEDIAL MENISECTOMY;  Surgeon: Margrette Taft BRAVO, MD;  Location: [facility];  Service: Orthopedics;  Laterality: Right;   KNEE ARTHROSCOPY WITH MEDIAL MENISECTOMY Right 09/13/2018   Procedure: KNEE ARTHROSCOPY WITH MEDIAL MENISCECTOMY AND MICROFRACTURE;  Surgeon: Margrette Taft BRAVO, MD;  Location: [facility];  Service: Orthopedics;  Laterality: Right;   LUMBAR LAMINECTOMY/DECOMPRESSION MICRODISCECTOMY Left 02/12/2021   Procedure: Left Lumbar Five- Sacral One Microdiscectomy;  Surgeon: Joshua Alm RAMAN, MD;  Location: [facility];  Service: Neurosurgery;  Laterality: Left;  Left Lumbar Five- Sacral One Microdiscectomy   MALONEY DILATION N/A 09/11/2015   Procedure: AGAPITO DILATION;  Surgeon: Lamar HERO Shaaron, MD;  Location: [facility];  Service: Endoscopy;  Laterality: N/A;   MALONEY DILATION N/A 12/19/2019   Procedure: AGAPITO DILATION;  Surgeon: Shaaron Lamar HERO, MD;  Location: [facility];  Service: Endoscopy;  Laterality: N/A;   POLYPECTOMY  12/19/2019   Procedure: POLYPECTOMY;  Surgeon: Shaaron Lamar HERO, MD;  Location: [facility];  Service: Endoscopy;;    OB History     Gravida  1   Para      Term      Preterm      AB  1   Living         SAB  1   IAB      Ectopic      Multiple      Live Births               Home Medications    Prior to Admission medications   Medication Sig Start Date End Date Taking? Authorizing Provider  azithromycin  (ZITHROMAX) 250 MG tablet Take 1 tablet (250 mg total) by mouth daily. Take first 2 tablets together, then 1 every day until finished. 01/18/24  Yes Leath-Warren, Etta PARAS, NP  cetirizine (ZYRTEC) 10 MG tablet Take 1 tablet (10 mg total) by mouth daily. 01/18/24  Yes Leath-Warren, Etta PARAS, NP  promethazine -dextromethorphan (PROMETHAZINE -DM) 6.25-15 MG/5ML syrup Take 5 mLs by mouth 4 (four) times daily as needed. 01/18/24  Yes Leath-Warren, Etta PARAS, NP  ALPRAZolam (XANAX) 0.5 MG tablet Take 0.5 mg by mouth 2 (two) times daily as needed for anxiety.  04/30/14   [provider]  amLODipine  (NORVASC ) 5 MG tablet SMARTSIG:1 Tablet(s) By Mouth Every Evening 07/15/21   [provider]  celecoxib (CELEBREX) 200 MG capsule Take 1 capsule every day by oral route. 11/30/23   [provider]  cyanocobalamin  (VITAMIN B12) 1000 MCG tablet Take 1 tablet (1,000 mcg total) by mouth daily. 07/19/23   Camara, Amadou, MD  DULoxetine  (CYMBALTA ) 30 MG capsule Take 30 mg by mouth daily. 08/21/19  [provider]  estradiol  (ESTRACE ) 1 MG tablet Take 1 tablet (1 mg total) by mouth daily. 12/08/23   Signa Delon LABOR, NP  ezetimibe  (ZETIA ) 10 MG tablet Take 10 mg by mouth daily. 02/20/20   [provider]  fluticasone  (FLONASE ) 50 MCG/ACT nasal spray Place 1 spray into both nostrils 2 (two) times daily. 06/11/23   Stuart Vernell Norris, PA-C  irbesartan  (AVAPRO ) 75 MG tablet Take 300 mg by mouth daily at 2 PM. 07/03/14   [provider]  lamoTRIgine  (LAMICTAL ) 100 MG tablet Take 1 tablet (100 mg total) by mouth 2 (two) times daily. 01/02/23 12/28/23  Camara, Amadou, MD  LINZESS  145 MCG CAPS capsule TAKE ONE CAPSULE BY MOUTH BEFORE BREAKFAST 12/31/21   Rudy Josette RAMAN, PA-C  metoprolol  succinate (TOPROL -XL) 25 MG 24 hr tablet Take 25 mg by mouth daily. 09/08/20   [provider]  NEXLIZET 180-10 MG TABS Take 1 tablet by mouth daily.    [provider]   ondansetron  (ZOFRAN ) 4 MG tablet Take 1 tablet (4 mg total) by mouth every 6 (six) hours. 11/10/19   Wurst, [place], PA-C  oxyCODONE -acetaminophen  (PERCOCET/ROXICET) 5-325 MG tablet Take 1 tablet by mouth every 4 (four) hours as needed for severe pain (pain.). 02/12/21   Joshua Alm Hamilton, MD  pantoprazole  (PROTONIX ) 40 MG tablet Take 1 tablet (40 mg total) by mouth 2 (two) times daily. 03/19/20   Rudy Josette RAMAN, PA-C  progesterone  (PROMETRIUM ) 200 MG capsule Take 1 capsule (200 mg total) by mouth daily. At bedtime 12/08/23   Signa Delon LABOR, NP  Vibegron  (GEMTESA ) 75 MG TABS Gemtesa  75 mg tablet  Take 1 tablet every day by oral route.    [provider]    Family History Family History  Problem Relation Age of Onset   Throat cancer Father    Heart disease Father    Cancer Father    Hypertension Father    Diabetes Father    Other Mother        esophageal stricture and gallbladder disease   Thyroid  disease Mother    Other Sister        esophageal stricture and gallbladder problems.    Diabetes Sister    Hypertension Sister    Thyroid  disease Sister    Heart disease Maternal Grandfather    Diabetes Maternal Grandfather    Arthritis Maternal Grandfather    Colon cancer Neg Hx    Liver disease Neg Hx    Pancreatic cancer Neg Hx     Social History Social History   Tobacco Use   Smoking status: Former    Current packs/day: 0.00    Average packs/day: 2.0 packs/day for 9.0 years (18.0 ttl pk-yrs)    Types: Cigarettes    Start date: 04/04/1985    Quit date: 04/04/1994    Years since quitting: 29.8   Smokeless tobacco: Never   Tobacco comments:    smoked 8 years, quit 18 years ago in the 1996  Vaping Use   Vaping status: Never Used  Substance Use Topics   Alcohol use: No    Alcohol/week: 0.0 standard drinks of alcohol   Drug use: No     Allergies   Benadryl [diphenhydramine hcl]   Review of Systems Review of Systems Per HPI  Physical Exam Triage Vital  Signs ED Triage Vitals  Encounter Vitals Group     BP 01/18/24 1359 116/78     Girls Systolic BP Percentile --  Girls Diastolic BP Percentile --      Boys Systolic BP Percentile --      Boys Diastolic BP Percentile --      Pulse Rate 01/18/24 1359 89     Resp 01/18/24 1359 18     Temp 01/18/24 1359 98.2 F (36.8 C)     Temp Source 01/18/24 1359 Oral     SpO2 01/18/24 1359 94 %     Weight --      Height --      Head Circumference --      Peak Flow --      Pain Score 01/18/24 1400 0     Pain Loc --      Pain Education --      Exclude from Growth Chart --    No data found.  Updated Vital Signs BP 116/78 (BP Location: Right Arm)   Pulse 89   Temp 98.2 F (36.8 C) (Oral)   Resp 18   LMP 07/06/2018   SpO2 94%   Visual Acuity Right Eye Distance:   Left Eye Distance:   Bilateral Distance:    Right Eye Near:   Left Eye Near:    Bilateral Near:     Physical Exam Vitals and nursing note reviewed.  Constitutional:      General: She is not in acute distress.    Appearance: Normal appearance. She is well-developed.  HENT:     Head: Normocephalic and atraumatic.     Right Ear: Tympanic membrane, ear canal and external ear normal.     Left Ear: Tympanic membrane, ear canal and external ear normal.     Nose: Congestion present.     Right Turbinates: Enlarged and swollen.     Left Turbinates: Enlarged and swollen.     Right Sinus: No maxillary sinus tenderness or frontal sinus tenderness.     Left Sinus: No maxillary sinus tenderness or frontal sinus tenderness.     Mouth/Throat:     Lips: Pink.     Mouth: Mucous membranes are moist.     Pharynx: Uvula midline. Posterior oropharyngeal erythema and postnasal drip present. No pharyngeal swelling, oropharyngeal exudate or uvula swelling.     Comments: Cobblestoning present to posterior oropharynx  Eyes:     Extraocular Movements: Extraocular movements intact.     Conjunctiva/sclera: Conjunctivae normal.     Pupils:  Pupils are equal, round, and reactive to light.  Neck:     Thyroid : No thyromegaly.     Trachea: No tracheal deviation.  Cardiovascular:     Rate and Rhythm: Normal rate and regular rhythm.     Pulses: Normal pulses.     Heart sounds: Normal heart sounds.  Pulmonary:     Effort: Pulmonary effort is normal. No respiratory distress.     Breath sounds: Normal breath sounds. No stridor. No wheezing, rhonchi or rales.  Abdominal:     General: Bowel sounds are normal.     Palpations: Abdomen is soft.     Tenderness: There is no abdominal tenderness.  Musculoskeletal:     Cervical back: Normal range of motion and neck supple.  Skin:    General: Skin is warm and dry.  Neurological:     General: No focal deficit present.     Mental Status: She is alert and oriented to person, place, and time.  Psychiatric:        Mood and Affect: Mood normal.        Behavior: Behavior normal.  Thought Content: Thought content normal.        Judgment: Judgment normal.      UC Treatments / Results  Labs (all labs ordered are listed, but only abnormal results are displayed) Labs Reviewed - No data to display  EKG   Radiology No results found.  Procedures Procedures (including critical care time)  Medications Ordered in UC Medications - No data to display  Initial Impression / Assessment and Plan / UC Course  I have reviewed the triage vital signs and the nursing notes.  Pertinent labs & imaging results that were available during my care of the patient were reviewed by me and considered in my medical decision making (see chart for details).  On exam, the patient's lung sounds are clear throughout, room air sats were 94%.  Patient has had persistent cough and chest congestion despite use of over-the-counter medications.  Viral testing deferred due to the duration of the patient's symptoms, nor will this change the course of treatment.  Will treat patient for lower respiratory infection  with azithromycin 250 mg.  Will provide symptomatic treatment with cetirizine 10 mg for nasal congestion, and Promethazine  DM for the cough.  Supportive care recommendations were provided discussed with the patient to include fluids, rest, over-the-counter analgesics, and use of a humidifier during sleep.  Discussed indications with patient regarding follow-up.  Patient was in agreement with this plan of care and verbalizes understanding.  All questions were answered.  Patient stable for discharge.  Final Clinical Impressions(s) / UC Diagnoses   Final diagnoses:  Lower respiratory infection  Cough, unspecified type     Discharge Instructions      Take medication as prescribed. Increase fluids and allow for plenty of rest. You may take over-the-counter Tylenol  as needed for pain, fever, or general discomfort. Recommend use of normal saline nasal spray throughout the day for nasal congestion and runny nose. For your cough, it will be helpful to use the minified air in your bedroom at nighttime during sleep and to sleep elevated on pillows while symptoms persist. As discussed, if symptoms fail to improve with this treatment, you may follow-up in this clinic or with your primary care physician for further evaluation. Follow-up as needed.     ED Prescriptions     Medication Sig Dispense Auth. Provider   azithromycin (ZITHROMAX) 250 MG tablet Take 1 tablet (250 mg total) by mouth daily. Take first 2 tablets together, then 1 every day until finished. 6 tablet Leath-Warren, Etta PARAS, NP   promethazine -dextromethorphan (PROMETHAZINE -DM) 6.25-15 MG/5ML syrup Take 5 mLs by mouth 4 (four) times daily as needed. 118 mL Leath-Warren, Etta PARAS, NP   cetirizine (ZYRTEC) 10 MG tablet Take 1 tablet (10 mg total) by mouth daily. 30 tablet Leath-Warren, Etta PARAS, NP      PDMP not reviewed this encounter.   Gilmer Etta PARAS, NP 01/18/24 1627

## 2024-01-22 ENCOUNTER — Other Ambulatory Visit: Payer: Self-pay

## 2024-01-22 ENCOUNTER — Ambulatory Visit (INDEPENDENT_AMBULATORY_CARE_PROVIDER_SITE_OTHER)

## 2024-01-22 ENCOUNTER — Ambulatory Visit
Admission: RE | Admit: 2024-01-22 | Discharge: 2024-01-22 | Disposition: A | Discharge status: Home / Self Care | Source: Ambulatory Visit | Attending: Nurse Practitioner | Admitting: Nurse Practitioner

## 2024-01-22 VITALS — BP 131/83 | HR 80 | Temp 97.7°F | Resp 15

## 2024-01-22 DIAGNOSIS — R9389 Abnormal findings on diagnostic imaging of other specified body structures: Secondary | ICD-10-CM | POA: Diagnosis not present

## 2024-01-22 DIAGNOSIS — R079 Chest pain, unspecified: Secondary | ICD-10-CM | POA: Insufficient documentation

## 2024-01-22 DIAGNOSIS — M940 Chondrocostal junction syndrome [Tietze]: Secondary | ICD-10-CM | POA: Diagnosis not present

## 2024-01-22 DIAGNOSIS — R059 Cough, unspecified: Secondary | ICD-10-CM | POA: Diagnosis not present

## 2024-01-22 MED ORDER — KETOROLAC TROMETHAMINE 30 MG/ML IJ SOLN
30.0000 mg | Freq: Once | INTRAMUSCULAR | Status: AC
  Administered 2024-01-22: 30 mg via INTRAMUSCULAR

## 2024-01-22 MED ORDER — DEXAMETHASONE SOD PHOSPHATE PF 10 MG/ML IJ SOLN
10.0000 mg | Freq: Once | INTRAMUSCULAR | Status: AC
  Administered 2024-01-22: 10 mg via INTRAMUSCULAR

## 2024-01-22 MED ORDER — PREDNISONE 50 MG PO TABS: ORAL_TABLET | ORAL | 0 refills | Status: DC

## 2024-01-22 NOTE — ED Provider Notes (Signed)
 [facility]    CSN: [id number] Arrival date & time: 01/22/24  1224      History   Chief Complaint Chief Complaint  Patient presents with   Cough   Chest Pain    HPI [name] is a 53 y.o. female.   The history is provided by the patient.   Patient seen for follow-up for right sided chest pain.  Patient was seen in this clinic on 01/18/2024 and diagnosed with a lower respiratory infection.  At that time.  She states that she did have pain in her right side.  Patient was treated with azithromycin, Promethazine  DM, and cetirizine 10 mg.  Patient states that the pain worsens when she coughs, deep breathes, and moves.  She denies fever, chills, wheezing, difficulty breathing, abdominal pain, nausea, vomiting, diarrhea, or rash.  She continues to deny any obvious injury or trauma.  States that her cough has improved.  She is continuing to take the medication as previously prescribed. Past Medical History:  Diagnosis Date   Anxiety    Arthritis    Balance problems    Chronic abdominal pain    Chronic knee pain    Constipation    Difficulty walking    GERD (gastroesophageal reflux disease)    Headache    HTN (hypertension)    Low back pain    Panic attacks    Polyuria    Seizures (HCC)    unknown etiology; on dilantin ; last seizures was a few years ago.   Sleep apnea     Patient Active Problem List   Diagnosis Date Noted   Thickened endometrium 01/16/2024   PMB (postmenopausal bleeding) 12/08/2023   Encounter for well woman exam with routine gynecological exam 12/08/2023   Sleep disturbance 10/16/2023   Night sweats 10/16/2023   Hot flashes 10/16/2023   Abnormal renal function 08/31/2023   Bipolar disorder (HCC) 03/13/2023   Cramps of lower extremity 03/01/2023   Restless legs 01/12/2023   Hot flash not due to menopause 12/01/2022   Actinic keratosis 08/31/2022   Hyperkalemia 03/02/2022   Major depressive disorder 03/02/2022   Mass of left  breast 02/16/2022   Acute conjunctivitis of left eye 01/12/2022   Pain of right thumb 11/03/2021   Headache 10/07/2021   Chest pain 10/07/2021   Prolapsed internal hemorrhoids, grade 2 09/02/2021   Fecal occult blood test positive 08/20/2021   Encounter for screening fecal occult blood testing 08/20/2021   Encounter for gynecological examination with Papanicolaou smear of cervix 08/20/2021   Hormone replacement therapy (HRT) 08/20/2021   Hemorrhoids 03/24/2021   Chronic pain 03/17/2021   Lumbar radiculopathy 10/02/2020   Lumbar pain 09/29/2020   Essential hypertension 08/27/2020   Rectal bleeding 08/12/2019   S/P right knee arthroscopy *with microfracture 09/13/18 09/18/2018   Acute medial meniscus tear of right knee    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    Derangement of posterior horn of medial meniscus of right knee    Primary osteoarthritis of right knee    Gastric polyp    Gastroesophageal reflux disease 08/24/2015   Hypoactive sexual desire disorder 05/14/2014   Abdominal pain, chronic, epigastric 12/08/2013   Constipation 12/08/2013   Esophageal dysphagia 02/22/2013   Abdominal pain, epigastric 02/22/2013   LUQ pain 02/22/2013   Difficulty walking 01/25/2012   Stiffness of joint, not elsewhere classified, ankle and foot 01/25/2012   Balance problems 01/25/2012   Ankle sprain 01/17/2012   Ankle pain, right 01/17/2012  Past Surgical History:  Procedure Laterality Date   BACK SURGERY     complicated by ureteral injury   CHONDROPLASTY Right 08/31/2016   Procedure: CHONDROPLASTY RIGHT FEMUR;  Surgeon: Margrette Taft BRAVO, MD;  Location: [facility];  Service: Orthopedics;  Laterality: Right;   COLONOSCOPY WITH PROPOFOL  N/A 12/19/2019   Procedure: COLONOSCOPY WITH PROPOFOL ;  Surgeon: Shaaron Lamar HERO, Nonbleeding external and internal grade 3 hemorrhoids, three 2-60mm polyps, otherwise normal.  Suspected benign anorectal bleeding.  Pathology revealed hyperplastic polyps.   Repeat colonoscopy in 10 years.   ESOPHAGOGASTRODUODENOSCOPY N/A 09/11/2015   Procedure: ESOPHAGOGASTRODUODENOSCOPY (EGD);  Surgeon: Lamar HERO Shaaron, MD;  Normal esophagus s/p dilation, few gastric polyps resected and retrieved, normal duodenum. Gastric polyps benign.    ESOPHAGOGASTRODUODENOSCOPY (EGD) WITH ESOPHAGEAL DILATION N/A 03/20/2013   Dr. Rourk:Normal EGD-status post passage of a Maloney dilator/Status post esophageal biopsy., benign path   ESOPHAGOGASTRODUODENOSCOPY (EGD) WITH PROPOFOL  N/A 12/19/2019   Procedure: ESOPHAGOGASTRODUODENOSCOPY (EGD) WITH PROPOFOL ;  Surgeon: Shaaron Lamar HERO, MD;  Normal esophagus s/p dilation, benign-appearing fundic gland polyps, normal examined duodenum.   KNEE ARTHROSCOPY WITH MEDIAL MENISECTOMY Right 08/31/2016   Procedure: KNEE ARTHROSCOPY WITH MEDIAL MENISECTOMY;  Surgeon: Margrette Taft BRAVO, MD;  Location: [facility];  Service: Orthopedics;  Laterality: Right;   KNEE ARTHROSCOPY WITH MEDIAL MENISECTOMY Right 09/13/2018   Procedure: KNEE ARTHROSCOPY WITH MEDIAL MENISCECTOMY AND MICROFRACTURE;  Surgeon: Margrette Taft BRAVO, MD;  Location: [facility];  Service: Orthopedics;  Laterality: Right;   LUMBAR LAMINECTOMY/DECOMPRESSION MICRODISCECTOMY Left 02/12/2021   Procedure: Left Lumbar Five- Sacral One Microdiscectomy;  Surgeon: Joshua Alm RAMAN, MD;  [facility];  Service: Neurosurgery;  Laterality: Left;  Left Lumbar Five- Sacral One Microdiscectomy   MALONEY DILATION N/A 09/11/2015   Procedure: AGAPITO DILATION;  Surgeon: Lamar HERO Shaaron, MD;  Location: [facility];  Service: Endoscopy;  Laterality: N/A;   MALONEY DILATION N/A 12/19/2019   Procedure: AGAPITO DILATION;  Surgeon: Shaaron Lamar HERO, MD;  Location: [facility];  Service: Endoscopy;  Laterality: N/A;   POLYPECTOMY  12/19/2019   Procedure: POLYPECTOMY;  Surgeon: Shaaron Lamar HERO, MD;  Location: [facility];  Service: Endoscopy;;    OB History     Gravida  1   Para      Term      Preterm       AB  1   Living         SAB  1   IAB      Ectopic      Multiple      Live Births               Home Medications    Prior to Admission medications   Medication Sig Start Date End Date Taking? Authorizing Provider  predniSONE  (DELTASONE ) 50 MG tablet Take 1 tablet daily with breakfast for the next 5 days. 01/22/24  Yes Leath-Warren, Etta PARAS, NP  ALPRAZolam (XANAX) 0.5 MG tablet Take 0.5 mg by mouth 2 (two) times daily as needed for anxiety.  04/30/14   [provider]  amLODipine  (NORVASC ) 5 MG tablet SMARTSIG:1 Tablet(s) By Mouth Every Evening 07/15/21   [provider]  azithromycin (ZITHROMAX) 250 MG tablet Take 1 tablet (250 mg total) by mouth daily. Take first 2 tablets together, then 1 every day until finished. 01/18/24   Leath-Warren, Etta PARAS, NP  celecoxib (CELEBREX) 200 MG capsule Take 1 capsule every day by oral route. 11/30/23   [provider]  cetirizine CURTIS)  10 MG tablet Take 1 tablet (10 mg total) by mouth daily. 01/18/24   Leath-Warren, Etta PARAS, NP  cyanocobalamin  (VITAMIN B12) 1000 MCG tablet Take 1 tablet (1,000 mcg total) by mouth daily. 07/19/23   Camara, Amadou, MD  DULoxetine  (CYMBALTA ) 30 MG capsule Take 30 mg by mouth daily. 08/21/19   [provider]  estradiol  (ESTRACE ) 1 MG tablet Take 1 tablet (1 mg total) by mouth daily. 12/08/23   Signa Delon LABOR, NP  ezetimibe  (ZETIA ) 10 MG tablet Take 10 mg by mouth daily. 02/20/20   [provider]  fluticasone  (FLONASE ) 50 MCG/ACT nasal spray Place 1 spray into both nostrils 2 (two) times daily. 06/11/23   Stuart Vernell Norris, PA-C  irbesartan  (AVAPRO ) 75 MG tablet Take 300 mg by mouth daily at 2 PM. 07/03/14   [provider]  lamoTRIgine  (LAMICTAL ) 100 MG tablet Take 1 tablet (100 mg total) by mouth 2 (two) times daily. 01/02/23 12/28/23  Gregg Lek, MD  LINZESS  145 MCG CAPS capsule TAKE ONE CAPSULE BY MOUTH BEFORE BREAKFAST 12/31/21   Rudy Josette RAMAN, PA-C  metoprolol  succinate (TOPROL -XL) 25 MG 24 hr tablet Take 25 mg by mouth daily. 09/08/20   [provider]  NEXLIZET 180-10 MG TABS Take 1 tablet by mouth daily.    [provider]  ondansetron  (ZOFRAN ) 4 MG tablet Take 1 tablet (4 mg total) by mouth every 6 (six) hours. 11/10/19   Wurst, [place], PA-C  oxyCODONE -acetaminophen  (PERCOCET/ROXICET) 5-325 MG tablet Take 1 tablet by mouth every 4 (four) hours as needed for severe pain (pain.). 02/12/21   Joshua Alm Hamilton, MD  pantoprazole  (PROTONIX ) 40 MG tablet Take 1 tablet (40 mg total) by mouth 2 (two) times daily. 03/19/20   Rudy Josette RAMAN, PA-C  progesterone  (PROMETRIUM ) 200 MG capsule Take 1 capsule (200 mg total) by mouth daily. At bedtime 12/08/23   Signa Delon LABOR, NP  promethazine -dextromethorphan (PROMETHAZINE -DM) 6.25-15 MG/5ML syrup Take 5 mLs by mouth 4 (four) times daily as needed. 01/18/24   Leath-Warren, Etta PARAS, NP  Vibegron  (GEMTESA ) 75 MG TABS Gemtesa  75 mg tablet  Take 1 tablet every day by oral route.    [provider]    Family History Family History  Problem Relation Age of Onset   Throat cancer Father    Heart disease Father    Cancer Father    Hypertension Father    Diabetes Father    Other Mother        esophageal stricture and gallbladder disease   Thyroid  disease Mother    Other Sister        esophageal stricture and gallbladder problems.    Diabetes Sister    Hypertension Sister    Thyroid  disease Sister    Heart disease Maternal Grandfather    Diabetes Maternal Grandfather    Arthritis Maternal Grandfather    Colon cancer Neg Hx    Liver disease Neg Hx    Pancreatic cancer Neg Hx     Social History Social History   Tobacco Use   Smoking status: Former    Current packs/day: 0.00    Average packs/day: 2.0 packs/day for 9.0 years (18.0 ttl pk-yrs)    Types: Cigarettes    Start date: 04/04/1985    Quit date: 04/04/1994    Years since quitting: 29.8    Smokeless tobacco: Never   Tobacco comments:    smoked 8 years, quit 18 years ago in the 1996  Vaping Use   Vaping  status: Never Used  Substance Use Topics   Alcohol use: No    Alcohol/week: 0.0 standard drinks of alcohol   Drug use: No     Allergies   Benadryl [diphenhydramine hcl]   Review of Systems Review of Systems Per HPI  Physical Exam Triage Vital Signs ED Triage Vitals [01/22/24 1240]  Encounter Vitals Group     BP 131/83     Girls Systolic BP Percentile      Girls Diastolic BP Percentile      Boys Systolic BP Percentile      Boys Diastolic BP Percentile      Pulse Rate 80     Resp 15     Temp 97.7 F (36.5 C)     Temp src      SpO2 97 %     Weight      Height      Head Circumference      Peak Flow      Pain Score 7     Pain Loc      Pain Education      Exclude from Growth Chart    No data found.  Updated Vital Signs BP 131/83   Pulse 80   Temp 97.7 F (36.5 C)   Resp 15   LMP 07/06/2018   SpO2 97%   Visual Acuity Right Eye Distance:   Left Eye Distance:   Bilateral Distance:    Right Eye Near:   Left Eye Near:    Bilateral Near:     Physical Exam Vitals and nursing note reviewed.  Constitutional:      General: She is not in acute distress.    Appearance: Normal appearance.  HENT:     Head: Normocephalic.  Eyes:     Extraocular Movements: Extraocular movements intact.     Conjunctiva/sclera: Conjunctivae normal.     Pupils: Pupils are equal, round, and reactive to light.  Cardiovascular:     Rate and Rhythm: Normal rate and regular rhythm.     Pulses: Normal pulses.     Heart sounds: Normal heart sounds.  Pulmonary:     Effort: Pulmonary effort is normal.     Breath sounds: Normal breath sounds.  Chest:     Chest wall: Tenderness present.    Abdominal:     General: Bowel sounds are normal.     Palpations: Abdomen is soft.     Tenderness: There is no abdominal tenderness.  Musculoskeletal:     Cervical back: Normal  range of motion.  Skin:    General: Skin is warm and dry.  Neurological:     General: No focal deficit present.     Mental Status: She is alert and oriented to person, place, and time.  Psychiatric:        Mood and Affect: Mood normal.        Behavior: Behavior normal.      UC Treatments / Results  Labs (all labs ordered are listed, but only abnormal results are displayed) Labs Reviewed - No data to display  EKG: Normal sinus rhythm, no STEMI.  Compared to EKGs dated 06/09/2023, 02/09/2021, and 07/30/2019.   Radiology DG Chest 2 View Result Date: 01/22/2024 EXAM: 2 VIEW(S) XRAY OF THE CHEST 01/22/2024 12:54:31 PM COMPARISON: 06/08/2023 CLINICAL HISTORY: worsening right sided chest pain with cough. FINDINGS: LUNGS AND PLEURA: The lungs are hypoinflated. Chronic asymmetric elevation of the right hemidiaphragm. Calcified granuloma noted in the left lower lobe. No pulmonary edema. No  pleural effusion. No pneumothorax. HEART AND MEDIASTINUM: No acute abnormality of the cardiac and mediastinal silhouettes. BONES AND SOFT TISSUES: Thoracic spondylosis noted. No acute osseous abnormality. IMPRESSION: 1. No acute cardiopulmonary process identified. Electronically signed by: Waddell Calk MD 01/22/2024 01:59 PM EDT RP Workstation: [id number]    Procedures Procedures (including critical care time)  Medications Ordered in UC Medications  ketorolac  (TORADOL ) 30 MG/ML injection 30 mg (30 mg Intramuscular Given 01/22/24 1320)  dexamethasone  (DECADRON ) injection 10 mg (10 mg Intramuscular Given 01/22/24 1321)    Initial Impression / Assessment and Plan / UC Course  I have reviewed the triage vital signs and the nursing notes.  Pertinent labs & imaging results that were available during my care of the patient were reviewed by me and considered in my medical decision making (see chart for details).  Patient was seen in this clinic for complaints of cough approximately 4 days ago.  She states since  that time, she has developed worsening right sided chest wall pain.  On exam, her lung sounds are clear throughout, room air sats at 97%.  She complains of pain with coughing, movement, and deep breathing, chest x-ray is negative for active cardiopulmonary disease.  Cannot rule out costochondritis or pleurisy.  Toradol  30 mg IM and Decadron  10 mg IM were administered for pain and inflammation.  Will start patient on prednisone  50 mg for the next 5 days.  Supportive care recommendations were provided discussed with the patient to include fluids, rest, over-the-counter analgesics, warm or cool compresses, and to monitor for worsening symptoms.  Patient was given strict ER follow-up precautions.  Patient was in agreement with this plan of care and verbalizes understanding.  All questions were answered.  Patient stable for discharge.   Final Clinical Impressions(s) / UC Diagnoses   Final diagnoses:  Right-sided chest pain  Costochondritis     Discharge Instructions      The chest x-ray was negative.  It is likely that you have an inflammation in the lining of the lung due to your persistent coughing. You were given injections of Decadron  10 mg and Toradol  30 mg.  Start the prednisone  tomorrow. Continue the current medications you are taking. You may take over-the-counter Tylenol  as needed for pain or discomfort.  Recommend the use of ice or heat.  Apply ice for pain or swelling, heat for spasm or stiffness.  Apply for 20 minutes, remove for 1 hour, repeat as needed. Continue to cough and deep breathe regularly to prevent the development of pneumonia. If you develop worsening chest pain, pain with moving, fever, chills, wheezing, or other concerns, please go to the emergency department for further evaluation. Follow-up as needed.      ED Prescriptions     Medication Sig Dispense Auth. Provider   predniSONE  (DELTASONE ) 50 MG tablet Take 1 tablet daily with breakfast for the next 5 days. 5  tablet Leath-Warren, Etta PARAS, NP      PDMP not reviewed this encounter.   Gilmer Etta PARAS, NP 01/22/24 1526

## 2024-01-22 NOTE — ED Triage Notes (Signed)
 Pt present with c/o rt side pain. Pt states it hurts when coughing, breathing and moving. States when she turns she feels more pain. Pt was here recently for URI and states it has been hurtingsince then.

## 2024-01-22 NOTE — Discharge Instructions (Addendum)
 The chest x-ray was negative.  It is likely that you have an inflammation in the lining of the lung due to your persistent coughing. You were given injections of Decadron  10 mg and Toradol  30 mg.  Start the prednisone  tomorrow. Continue the current medications you are taking. You may take over-the-counter Tylenol  as needed for pain or discomfort.  Recommend the use of ice or heat.  Apply ice for pain or swelling, heat for spasm or stiffness.  Apply for 20 minutes, remove for 1 hour, repeat as needed. Continue to cough and deep breathe regularly to prevent the development of pneumonia. If you develop worsening chest pain, pain with moving, fever, chills, wheezing, or other concerns, please go to the emergency department for further evaluation. Follow-up as needed.

## 2024-01-31 ENCOUNTER — Telehealth: Payer: Self-pay | Admitting: Neurology

## 2024-01-31 ENCOUNTER — Ambulatory Visit: Admitting: Neurology

## 2024-01-31 ENCOUNTER — Encounter: Payer: Self-pay | Admitting: Neurology

## 2024-01-31 VITALS — BP 120/82 | HR 77 | Ht 66.0 in | Wt 179.5 lb

## 2024-01-31 DIAGNOSIS — F32A Depression, unspecified: Secondary | ICD-10-CM | POA: Diagnosis not present

## 2024-01-31 DIAGNOSIS — G40909 Epilepsy, unspecified, not intractable, without status epilepticus: Secondary | ICD-10-CM | POA: Diagnosis not present

## 2024-01-31 MED ORDER — LAMOTRIGINE 100 MG PO TABS: 100.0000 mg | ORAL_TABLET | Freq: Two times a day (BID) | ORAL | 3 refills | Status: AC

## 2024-01-31 NOTE — Patient Instructions (Addendum)
 Continue lamotrigine  100 mg twice daily Continue your other medications Referral to therapy for management of depressive symptoms Continue to follow PCP Return 1 year or sooner if worse

## 2024-01-31 NOTE — Telephone Encounter (Signed)
 Referral for psychology fax to [facility]. Phone: [phone], Fax: [phone]

## 2024-01-31 NOTE — Progress Notes (Signed)
 [facility]  PATIENT: [name] DOB: [birth year]  REQUESTING CLINICIAN: Shona Norleen PEDLAR, MD HISTORY FROM: Patient and girlfriend  REASON FOR VISIT: Follow up Epilepsy    HISTORICAL  CHIEF COMPLAINT:  Chief Complaint  Patient presents with   Cognitive Impairment    F/U States everything is going pretty good.    INTERVAL HISTORY 01/31/2024 Patient presents today for follow-up, last visit was in April at that time we recommended neuropsychological testing for her cognitive complaint.  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  She does report some depressive symptoms for a long period of time.  She is currently on Cymbalta  but does not have a psychiatrist or therapist. When it comes to her seizures, denies any seizure or seizure-like activity.  She report compliance with her lamotrigine  100 mg twice daily.    INTERVAL HISTORY 07/18/2023:  Patient presents today for follow-up, she is accompanied by Chastity.  Last visit was in September, at that time we continued her on lamotrigine  200 mg daily.  She was doing fine until March 6 when she presented to the hospital with URI symptoms, complaining of fever and malaise.  While in the room she did collapse and have seizure-like activity lasting less than 10 seconds without any injuries.  Her lamotrigine  was checked and it was less than 1.  She reports compliance with the medication.  There was only one reported seizure.  Since then, patient is back taking her lamotrigine  200 mg daily, reports compliance with the medication, now her main complaint is forgetfulness, very forgetful, has to be reminded multiple times in order to get things done. Reports that her restless leg symptoms have improved. Her Ferritin was normal.    INTERVAL HISTORY 01/02/2023:  Patient presents today for follow-up, she is accompanied by Chasity, last visit was in April, since then she has  not had any seizure or seizure-like activity.  She is compliant with the lamotrigine  200 mg in the morning.  Reports that pregabalin  was discontinued by PCP.  Currently she is not on any medication for her restless leg syndrome.  She said that the abnormal sensation and the leg movement is disrupting her sleep.   INTERVAL HISTORY 07/26/2022:  Patient presents today for follow-up, at last visit in January we have discontinued the phenytoin  and increase the lamotrigine  to 100 mg twice daily.  She could not tolerate the increase because she did have insomnia at night.  Her lamotrigine  was decreased to 150 mg daily.  She reports that she was doing well until last week when she had a seizure at the doctor's office.  She was getting blood drawn, phlebotomist asked her to hold the cotton and applied pressure and when she looked at the blood she passed out.  EMS was not called.  She was observed at the doctor's office for about 30 minutes before going home.  She states she was tired for the next 3 days.  Other than that event she has been doing well.  She reports compliance with the medicine. She stated at night she still have difficulty sleeping on occasion due to restless leg and neuropathy. She was previously put on pregabalin  50 mg nightly but she is not sure of the indication.   INTERVAL HISTORY 04/14/2022:  Patient presents today for follow-up she is accompanied by her girlfriend.  At last visit plan was to start lamotrigine .  Currently she is on lamotrigine  100 mg daily instead of 100 mg  twice daily.  She is still on the phenytoin  200 mg twice daily.  She tolerates the medication very well.  Denies any side effect from the medication.  Overall doing well, no complaints, no concerns.   HISTORY OF PRESENT ILLNESS:  This is a 53 year old woman with past medical history hypertension, anxiety, seizure disorder who is presenting to establish care.  Patient reports history of seizures since childhood.  Seizure has  been on and off per patient and she is currently on Dilantin  200 mg twice daily, reports being on Dilantin  for many years.  She stated that she was on 1 medication in the past but does not remember the name.  She reports sometimes when she is in pain she will have seizures.  Her last seizure was associated with her twisting her left ankle, this was a month ago.  Prior to that her seizure was a month before and not triggered by pain.  She is compliant with the medication.  She denies any seizure risk factors and denies any major side effect with the Dilantin .  Her seizures are described as generalized convulsion followed by postictal confusion and tiredness.  Girlfriend reports that she sleeps the rest of the day after having a seizure.  No major injury associated with the seizures but she bit her tongue in the past and had urinary incontinence.   Handedness: Right handed   Onset: Since childhood   Seizure Type: Generalized convulsion   Current frequency: 1 a month but can go months without seizures   Any injuries from seizures: Tongue biting  Seizure risk factors: Denies   Previous ASMs: Dilantin  and another one that she does not remember the name   Currenty ASMs: Lamotrigine  200 mg daily   ASMs side effects: None   Brain Images: Normal head CT   Previous EEGs: Normal     OTHER MEDICAL CONDITIONS: Hypertension, Anxiety, Seizure  REVIEW OF SYSTEMS: Full 14 system review of systems performed and negative with exception of: As noted in the HPI   ALLERGIES: Allergies  Allergen Reactions   Benadryl [Diphenhydramine Hcl] Rash    HOME MEDICATIONS: Outpatient Medications Prior to Visit  Medication Sig Dispense Refill   ALPRAZolam (XANAX) 0.5 MG tablet Take 0.5 mg by mouth 2 (two) times daily as needed for anxiety.   5   amLODipine  (NORVASC ) 5 MG tablet SMARTSIG:1 Tablet(s) By Mouth Every Evening     celecoxib (CELEBREX) 200 MG capsule Take 1 capsule every day by oral route.      cetirizine (ZYRTEC) 10 MG tablet Take 1 tablet (10 mg total) by mouth daily. 30 tablet 0   cyanocobalamin  (VITAMIN B12) 1000 MCG tablet Take 1 tablet (1,000 mcg total) by mouth daily. 90 tablet 3   DULoxetine  (CYMBALTA ) 30 MG capsule Take 30 mg by mouth daily.     estradiol  (ESTRACE ) 1 MG tablet Take 1 tablet (1 mg total) by mouth daily. 90 tablet 3   ezetimibe  (ZETIA ) 10 MG tablet Take 10 mg by mouth daily.     fluticasone  (FLONASE ) 50 MCG/ACT nasal spray Place 1 spray into both nostrils 2 (two) times daily. 16 g 2   irbesartan  (AVAPRO ) 75 MG tablet Take 300 mg by mouth daily at 2 PM.  12   lamoTRIgine  (LAMICTAL ) 100 MG tablet Take 1 tablet (100 mg total) by mouth 2 (two) times daily. 180 tablet 3   LINZESS  145 MCG CAPS capsule TAKE ONE CAPSULE BY MOUTH BEFORE BREAKFAST 30 capsule 11   metoprolol  succinate (  TOPROL -XL) 25 MG 24 hr tablet Take 25 mg by mouth daily.     NEXLIZET 180-10 MG TABS Take 1 tablet by mouth daily.     ondansetron  (ZOFRAN ) 4 MG tablet Take 1 tablet (4 mg total) by mouth every 6 (six) hours. 12 tablet 0   oxyCODONE -acetaminophen  (PERCOCET/ROXICET) 5-325 MG tablet Take 1 tablet by mouth every 4 (four) hours as needed for severe pain (pain.). 30 tablet 0   pantoprazole  (PROTONIX ) 40 MG tablet Take 1 tablet (40 mg total) by mouth 2 (two) times daily. 180 tablet 3   progesterone  (PROMETRIUM ) 200 MG capsule Take 1 capsule (200 mg total) by mouth daily. At bedtime 90 capsule 3   Vibegron  (GEMTESA ) 75 MG TABS Gemtesa  75 mg tablet  Take 1 tablet every day by oral route.     azithromycin (ZITHROMAX) 250 MG tablet Take 1 tablet (250 mg total) by mouth daily. Take first 2 tablets together, then 1 every day until finished. 6 tablet 0   predniSONE  (DELTASONE ) 50 MG tablet Take 1 tablet daily with breakfast for the next 5 days. 5 tablet 0   promethazine -dextromethorphan (PROMETHAZINE -DM) 6.25-15 MG/5ML syrup Take 5 mLs by mouth 4 (four) times daily as needed. 118 mL 0   No  facility-administered medications prior to visit.    PAST MEDICAL HISTORY: Past Medical History:  Diagnosis Date   Anxiety    Arthritis    Balance problems    Chronic abdominal pain    Chronic knee pain    Constipation    Difficulty walking    GERD (gastroesophageal reflux disease)    Headache    HTN (hypertension)    Low back pain    Panic attacks    Polyuria    Seizures (HCC)    unknown etiology; on dilantin ; last seizures was a few years ago.   Sleep apnea     PAST SURGICAL HISTORY: Past Surgical History:  Procedure Laterality Date   BACK SURGERY     complicated by ureteral injury   CHONDROPLASTY Right 08/31/2016   Procedure: CHONDROPLASTY RIGHT FEMUR;  Surgeon: Margrette Taft BRAVO, MD;  Location: [facility];  Service: Orthopedics;  Laterality: Right;   COLONOSCOPY WITH PROPOFOL  N/A 12/19/2019   Procedure: COLONOSCOPY WITH PROPOFOL ;  Surgeon: Shaaron Lamar HERO, Nonbleeding external and internal grade 3 hemorrhoids, three 2-4mm polyps, otherwise normal.  Suspected benign anorectal bleeding.  Pathology revealed hyperplastic polyps.  Repeat colonoscopy in 10 years.   ESOPHAGOGASTRODUODENOSCOPY N/A 09/11/2015   Procedure: ESOPHAGOGASTRODUODENOSCOPY (EGD);  Surgeon: Lamar HERO Shaaron, MD;  Normal esophagus s/p dilation, few gastric polyps resected and retrieved, normal duodenum. Gastric polyps benign.    ESOPHAGOGASTRODUODENOSCOPY (EGD) WITH ESOPHAGEAL DILATION N/A 03/20/2013   Dr. Rourk:Normal EGD-status post passage of a Maloney dilator/Status post esophageal biopsy., benign path   ESOPHAGOGASTRODUODENOSCOPY (EGD) WITH PROPOFOL  N/A 12/19/2019   Procedure: ESOPHAGOGASTRODUODENOSCOPY (EGD) WITH PROPOFOL ;  Surgeon: Shaaron Lamar HERO, MD;  Normal esophagus s/p dilation, benign-appearing fundic gland polyps, normal examined duodenum.   KNEE ARTHROSCOPY WITH MEDIAL MENISECTOMY Right 08/31/2016   Procedure: KNEE ARTHROSCOPY WITH MEDIAL MENISECTOMY;  Surgeon: Margrette Taft BRAVO, MD;  Location: [facility];  Service: Orthopedics;  Laterality: Right;   KNEE ARTHROSCOPY WITH MEDIAL MENISECTOMY Right 09/13/2018   Procedure: KNEE ARTHROSCOPY WITH MEDIAL MENISCECTOMY AND MICROFRACTURE;  Surgeon: Margrette Taft BRAVO, MD;  Location: [facility];  Service: Orthopedics;  Laterality: Right;   LUMBAR LAMINECTOMY/DECOMPRESSION MICRODISCECTOMY Left 02/12/2021   Procedure: Left Lumbar Five- Sacral One Microdiscectomy;  Surgeon: Joshua Alm RAMAN,  MD;  Location: [facility];  Service: Neurosurgery;  Laterality: Left;  Left Lumbar Five- Sacral One Microdiscectomy   MALONEY DILATION N/A 09/11/2015   Procedure: AGAPITO DILATION;  Surgeon: Lamar CHRISTELLA Hollingshead, MD;  Location: [facility];  Service: Endoscopy;  Laterality: N/A;   MALONEY DILATION N/A 12/19/2019   Procedure: AGAPITO DILATION;  Surgeon: Hollingshead Lamar CHRISTELLA, MD;  Location: [facility];  Service: Endoscopy;  Laterality: N/A;   POLYPECTOMY  12/19/2019   Procedure: POLYPECTOMY;  Surgeon: Hollingshead Lamar CHRISTELLA, MD;  Location: [facility];  Service: Endoscopy;;    FAMILY HISTORY: Family History  Problem Relation Age of Onset   Throat cancer Father    Heart disease Father    Cancer Father    Hypertension Father    Diabetes Father    Other Mother        esophageal stricture and gallbladder disease   Thyroid  disease Mother    Other Sister        esophageal stricture and gallbladder problems.    Diabetes Sister    Hypertension Sister    Thyroid  disease Sister    Heart disease Maternal Grandfather    Diabetes Maternal Grandfather    Arthritis Maternal Grandfather    Colon cancer Neg Hx    Liver disease Neg Hx    Pancreatic cancer Neg Hx     SOCIAL HISTORY: Social History   Socioeconomic History   Marital status: Significant Other    Spouse name: Not on file   Number of children: 0   Years of education: 12   Highest education level: Not on file  Occupational History   Occupation: disability    Employer: DISABLED  Tobacco Use   Smoking status: Former     Current packs/day: 0.00    Average packs/day: 2.0 packs/day for 9.0 years (18.0 ttl pk-yrs)    Types: Cigarettes    Start date: 04/04/1985    Quit date: 04/04/1994    Years since quitting: 29.8   Smokeless tobacco: Never   Tobacco comments:    smoked 8 years, quit 18 years ago in the 1996  Vaping Use   Vaping status: Never Used  Substance and Sexual Activity   Alcohol use: No    Alcohol/week: 0.0 standard drinks of alcohol   Drug use: No   Sexual activity: Not Currently    Birth control/protection: Post-menopausal  Other Topics Concern   Not on file  Social History Narrative   Not on file   Social Drivers of Health   Financial Resource Strain: Low Risk  (12/08/2023)   Overall Financial Resource Strain (CARDIA)    Difficulty of Paying Living Expenses: Not very hard  Food Insecurity: No Food Insecurity (12/08/2023)   Hunger Vital Sign    Worried About Running Out of Food in the Last Year: Never true    Ran Out of Food in the Last Year: Never true  Transportation Needs: No Transportation Needs (12/08/2023)   PRAPARE - Administrator, Civil Service (Medical): No    Lack of Transportation (Non-Medical): No  Physical Activity: Unknown (12/08/2023)   Exercise Vital Sign    Days of Exercise per Week: Patient declined    Minutes of Exercise per Session: 20 min  Stress: No Stress Concern Present (12/08/2023)   [employer] of Occupational Health - Occupational Stress Questionnaire    Feeling of Stress: Only a little  Social Connections: Unknown (12/08/2023)   Social Connection and Isolation Panel    Frequency  of Communication with Friends and Family: Three times a week    Frequency of Social Gatherings with Friends and Family: Three times a week    Attends Religious Services: More than 4 times per year    Active Member of Clubs or Organizations: No    Attends Banker Meetings: Never    Marital Status: Patient declined  Intimate Partner Violence: Not At Risk  (12/08/2023)   Humiliation, Afraid, Rape, and Kick questionnaire    Fear of Current or Ex-Partner: No    Emotionally Abused: No    Physically Abused: No    Sexually Abused: No    PHYSICAL EXAM  GENERAL EXAM/CONSTITUTIONAL: Vitals:  Vitals:   01/31/24 1006  BP: 120/82  Pulse: 77  SpO2: 97%  Weight: 179 lb 8 oz (81.4 kg)  Height: 5' 6 (1.676 m)    Body mass index is 28.97 kg/m. Wt Readings from Last 3 Encounters:  01/31/24 179 lb 8 oz (81.4 kg)  12/11/23 181 lb (82.1 kg)  12/08/23 182 lb 8 oz (82.8 kg)   Patient is in no distress; well developed, nourished and groomed; neck is supple, tearful, appears anxious   MUSCULOSKELETAL: Gait, strength, tone, movements noted in Neurologic exam below  NEUROLOGIC: MENTAL STATUS:      No data to display         awake, alert, oriented to person, place and time recent and remote memory intact normal attention and concentration language fluent, comprehension intact, naming intact fund of knowledge appropriate  CRANIAL NERVE:  2nd, 3rd, 4th, 6th - Visual fields full to confrontation, extraocular muscles intact, no nystagmus 5th - facial sensation symmetric 7th - facial strength symmetric 8th - hearing intact 9th - palate elevates symmetrically, uvula midline 11th - shoulder shrug symmetric 12th - tongue protrusion midline  MOTOR:  normal bulk and tone, full strength in the BUE, BLE  SENSORY:  normal and symmetric to light touch  COORDINATION:  finger-nose-finger, fine finger movements normal  GAIT/STATION:  normal   DIAGNOSTIC DATA (LABS, IMAGING, TESTING) - I reviewed patient records, labs, notes, testing and imaging myself where available.  Lab Results  Component Value Date   WBC 3.9 (L) 06/08/2023   HGB 13.2 06/08/2023   HCT 39.2 06/08/2023   MCV 91.4 06/08/2023   PLT 227 06/08/2023      Component Value Date/Time   NA 140 07/18/2023 1442   K 4.5 07/18/2023 1442   CL 98 07/18/2023 1442   CO2 24  07/18/2023 1442   GLUCOSE 89 07/18/2023 1442   GLUCOSE 126 (H) 06/08/2023 1919   BUN 13 07/18/2023 1442   CREATININE 0.93 07/18/2023 1442   CALCIUM 10.0 07/18/2023 1442   PROT 7.2 06/08/2023 1919   ALBUMIN 3.7 06/08/2023 1919   AST 65 (H) 06/08/2023 1919   AST 15 10/19/2013 0000   ALT 73 (H) 06/08/2023 1919   ALKPHOS 44 06/08/2023 1919   ALKPHOS 73 10/19/2013 0000   BILITOT 0.5 06/08/2023 1919   BILITOT 0.3 10/19/2013 0000   GFRNONAA >60 06/08/2023 1919   GFRAA >60 07/29/2019 0830   No results found for: CHOL, HDL, LDLCALC, LDLDIRECT, TRIG No results found for: HGBA1C Lab Results  Component Value Date   VITAMINB12 368 07/18/2023   Lab Results  Component Value Date   TSH 0.576 07/18/2023    CT Head 10/07/21 Normal head CT for age.  No explanation for patient's symptoms.   Routine EEG 03/24/22 Normal    NEUROPSYCHOLOGICAL SUMMARY & CLINICAL IMPRESSIONS:  Ms. Aveyah Greenwood is a 53 year old, right-hand dominant, partnered woman with a history of anxiety, depression, epilepsy (last seizure in March 2025 in the setting of likely medication noncompliance and febrile illness) and concerns about cognitive function over the past while seen on referral from Dr. Gregg with [facility]. Dr. Gregg thought that she could have a mild cognitive impairment level problem but was also concerned about the possible impact of depressive illness and other factors on her cognition. She is reporting fairly nonspecific symptoms, including forgetting to do things that she has been asked to do, details of conversations, and difficulty comprehending what she is reading when she reads the bible. She has been disabled for many years related to back problems.  This is an equivocal neuropsychological study, which is considered broadly within normal limits for this patient. Her measured level of prior overall ability fell in the borderline range and she demonstrated comparable test data in essentially all  areas. This is with the caveat that there was performance variability with occasional low scores on measures of attention, verbal fluency, and memory that could reflect executive control problems, yet none of these areas clearly meets the threshold required to be considered frankly impaired for his patient. Her best area of performance at an index level was in fact on measures of delayed recall, which is reassuring about her memory storage capacity. She reported mild levels of depressive any anxiety symptoms and my sense is that this may be an underreport on the basis of some alexithymia.   This neuropsychological study is broadly reassuring regarding the presence of any significant impairment although it does suggest some possible very mild diminishment that may relate to her day-to-day experience of cognitive problems. Of note, the patient scored at a borderline level on measures of overall intellectual abilities, recommending modest expectations for her performance on standardized cognitive tests. This also places her at increased risk of cognitive difficulties relative to peers. Her day-to-day problems seem likely on the basis of interference from ongoing affective distress, any side effects from her antiseizure medication regimen, and potentially also insufficient sleep/untreated OSA in the setting of modest premorbid ability. The patient might benefit from updated sleep study and treatment if she is amenable. She could also potentially benefit from counseling services, in addition to medication management, for her mood problems. She is welcome to return for neuropsychological assessment should she perceive worsening in the future although there are no strong indications of a progressive condition.   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 year old female  with anxiety,  hypertension and seizure disorder who is presenting for follow-up.  She is currently on lamotrigine  200 mg daily, denies any seizure or seizure activity.  She does report compliance with the medication.  Her neuropsych testing was completed, showing borderline intellectual functioning but major depressive disorder.  Plan will be for patient to be referred to psychologist for therapy.  I also gave her information regarding [email or web address] to do her own research regarding finding a therapist.  I will see her on a yearly basis.   1. Nonintractable epilepsy without status epilepticus, unspecified epilepsy type (HCC)   2. Depression, unspecified depression type      Patient Instructions  Continue lamotrigine  100 mg twice daily Continue your other medications Referral to therapy for management of depressive symptoms Continue to follow PCP Return 1 year or sooner if worse   Per [place]  DMV  statutes, patients with seizures are not allowed to drive until they have been seizure-free for six months.  Other recommendations include using caution when using heavy equipment or power tools. Avoid working on ladders or at heights. Take showers instead of baths.  Do not swim alone.  Ensure the water  temperature is not too high on the home water  heater. Do not go swimming alone. Do not lock yourself in a room alone (i.e. bathroom). When caring for infants or small children, sit down when holding, feeding, or changing them to minimize risk of injury to the child in the event you have a seizure. Maintain good sleep hygiene. Avoid alcohol.  Also recommend adequate sleep, hydration, good diet and minimize stress.   During the Seizure  - First, ensure adequate ventilation and place patients on the floor on their left side  Loosen clothing around the neck and ensure the airway is patent. If the patient is clenching the teeth, do not force the mouth open with any object as this can cause severe damage -  Remove all items from the surrounding that can be hazardous. The patient may be oblivious to what's happening and may not even know what he or she is doing. If the patient is confused and wandering, either gently guide him/her away and block access to outside areas - Reassure the individual and be comforting - Call 911. In most cases, the seizure ends before EMS arrives. However, there are cases when seizures may last over 3 to 5 minutes. Or the individual may have developed breathing difficulties or severe injuries. If a pregnant patient or a person with diabetes develops a seizure, it is prudent to call an ambulance. - Finally, if the patient does not regain full consciousness, then call EMS. Most patients will remain confused for about 45 to 90 minutes after a seizure, so you must use judgment in calling for help. - Avoid restraints but make sure the patient is in a bed with padded side rails - Place the individual in a lateral position with the neck slightly flexed; this will help the saliva drain from the mouth and prevent the tongue from falling backward - Remove all nearby furniture and other hazards from the area - Provide verbal assurance as the individual is regaining consciousness - Provide the patient with privacy if possible - Call for help and start treatment as ordered by the caregiver   After the Seizure (Postictal Stage)  After a seizure, most patients experience confusion, fatigue, muscle pain and/or a headache. Thus, one should permit the individual to sleep. For the next few days, reassurance is essential. Being calm and helping reorient the person is also of importance.  Most seizures are painless and end spontaneously. Seizures are not harmful to others but can lead to complications such as stress on the lungs, brain and the heart. Individuals with prior lung problems may develop labored breathing and respiratory distress.     Orders Placed This Encounter  Procedures    Ambulatory referral to Psychology    No orders of the defined types were placed in this encounter.   Return in about 1 year (around 01/30/2025).    Pastor Falling, MD 01/31/2024, 10:59 AM  [facility] [address] [place], [place] [address] [phone]

## 2024-02-01 ENCOUNTER — Encounter: Payer: Self-pay | Admitting: Obstetrics & Gynecology

## 2024-02-01 ENCOUNTER — Ambulatory Visit: Admitting: Obstetrics & Gynecology

## 2024-02-01 VITALS — BP 134/88 | HR 91 | Ht 66.0 in | Wt 178.2 lb

## 2024-02-01 DIAGNOSIS — Z79890 Hormone replacement therapy (postmenopausal): Secondary | ICD-10-CM

## 2024-02-01 DIAGNOSIS — N882 Stricture and stenosis of cervix uteri: Secondary | ICD-10-CM | POA: Diagnosis not present

## 2024-02-01 DIAGNOSIS — R9389 Abnormal findings on diagnostic imaging of other specified body structures: Secondary | ICD-10-CM | POA: Diagnosis not present

## 2024-02-01 NOTE — Progress Notes (Signed)
 GYN VISIT Patient name: [name] MRN [id number]  Date of birth: [birth year] Chief Complaint:   endometrial biopsy  History of Present Illness:   [name] is a 53 y.o. G1P0010 PM female being seen today for follow up regarding:  PMB: Notes several week ago had an episode of BRB- last for a few days, required a pantyliner and when wiped.  No pain.  No further bleeding.  Reports no other acute gyn concerns, no changes since her last visit.  HRT- taking estrogen/prometrium   US  completed: 01/15/2024: 5.1 x 2.9 x 3.9 cm uterus atrophic 29 cc volume.  Endometrial measuring 7.8 mm.  Normal ovaries bilaterally  Patient's last menstrual period was 07/06/2018.    Review of Systems:   Pertinent items are noted in HPI Denies fever/chills, dizziness, headaches, visual disturbances, fatigue, shortness of breath, chest pain, abdominal pain, vomiting. Pertinent History Reviewed:   Past Surgical History:  Procedure Laterality Date   BACK SURGERY     complicated by ureteral injury   CHONDROPLASTY Right 08/31/2016   Procedure: CHONDROPLASTY RIGHT FEMUR;  Surgeon: Margrette Taft BRAVO, MD;  Location: [facility];  Service: Orthopedics;  Laterality: Right;   COLONOSCOPY WITH PROPOFOL  N/A 12/19/2019   Procedure: COLONOSCOPY WITH PROPOFOL ;  Surgeon: Shaaron Lamar HERO, Nonbleeding external and internal grade 3 hemorrhoids, three 2-104mm polyps, otherwise normal.  Suspected benign anorectal bleeding.  Pathology revealed hyperplastic polyps.  Repeat colonoscopy in 10 years.   ESOPHAGOGASTRODUODENOSCOPY N/A 09/11/2015   Procedure: ESOPHAGOGASTRODUODENOSCOPY (EGD);  Surgeon: Lamar HERO Shaaron, MD;  Normal esophagus s/p dilation, few gastric polyps resected and retrieved, normal duodenum. Gastric polyps benign.    ESOPHAGOGASTRODUODENOSCOPY (EGD) WITH ESOPHAGEAL DILATION N/A 03/20/2013   Dr. Rourk:Normal EGD-status post passage of a Maloney dilator/Status post esophageal biopsy., benign path    ESOPHAGOGASTRODUODENOSCOPY (EGD) WITH PROPOFOL  N/A 12/19/2019   Procedure: ESOPHAGOGASTRODUODENOSCOPY (EGD) WITH PROPOFOL ;  Surgeon: Shaaron Lamar HERO, MD;  Normal esophagus s/p dilation, benign-appearing fundic gland polyps, normal examined duodenum.   KNEE ARTHROSCOPY WITH MEDIAL MENISECTOMY Right 08/31/2016   Procedure: KNEE ARTHROSCOPY WITH MEDIAL MENISECTOMY;  Surgeon: Margrette Taft BRAVO, MD;  Location: [facility];  Service: Orthopedics;  Laterality: Right;   KNEE ARTHROSCOPY WITH MEDIAL MENISECTOMY Right 09/13/2018   Procedure: KNEE ARTHROSCOPY WITH MEDIAL MENISCECTOMY AND MICROFRACTURE;  Surgeon: Margrette Taft BRAVO, MD;  Location: [facility];  Service: Orthopedics;  Laterality: Right;   LUMBAR LAMINECTOMY/DECOMPRESSION MICRODISCECTOMY Left 02/12/2021   Procedure: Left Lumbar Five- Sacral One Microdiscectomy;  Surgeon: Joshua Alm RAMAN, MD;  Location: [facility];  Service: Neurosurgery;  Laterality: Left;  Left Lumbar Five- Sacral One Microdiscectomy   MALONEY DILATION N/A 09/11/2015   Procedure: AGAPITO DILATION;  Surgeon: Lamar HERO Shaaron, MD;  [facility];  Service: Endoscopy;  Laterality: N/A;   MALONEY DILATION N/A 12/19/2019   Procedure: AGAPITO DILATION;  Surgeon: Shaaron Lamar HERO, MD;  [facility];  Service: Endoscopy;  Laterality: N/A;   POLYPECTOMY  12/19/2019   Procedure: POLYPECTOMY;  Surgeon: Shaaron Lamar HERO, MD;  [facility]: [facility];  Service: Endoscopy;;    Past Medical History:  Diagnosis Date   Anxiety    Arthritis    Balance problems    Chronic abdominal pain    Chronic knee pain    Constipation    Difficulty walking    GERD (gastroesophageal reflux disease)    Headache    HTN (hypertension)    Low back pain    Panic attacks    Polyuria  Seizures (HCC)    unknown etiology; on dilantin ; last seizures was a few years ago.   Sleep apnea    Reviewed problem list, medications and allergies. Physical Assessment:   Vitals:   02/01/24 1154  BP: 134/88   Pulse: 91  Weight: 178 lb 3.2 oz (80.8 kg)  Height: 5' 6 (1.676 m)  Body mass index is 28.76 kg/m.       Physical Examination:   General appearance: alert, well appearing, and in no distress  Psych: mood appropriate, normal affect  Skin: warm & dry   Cardiovascular: normal heart rate noted  Respiratory: normal respiratory effort, no distress  Abdomen: soft, non-tender   Pelvic: VULVA: Right labial skin tag noted-approximately 1 cm in size.  VAGINA: normal appearing vagina with normal color and discharge, no lesions, CERVIX: Small stenotic cervix noted.  No masses or lesions.  See procedure  extremities: no edema   Chaperone: Aleck Blase    Endometrial Biopsy Procedure Note  Pre-operative Diagnosis: Thickened endometrium  Post-operative Diagnosis: same  Procedure Details  The risks (including infection, bleeding, pain, and uterine perforation) and benefits of the procedure were explained to the patient and Written informed consent was obtained.  Antibiotic prophylaxis against endocarditis was not indicated.   The patient was placed in the dorsal lithotomy position.  Bimanual exam showed the uterus to be in the neutral position.  A speculum inserted in the vagina, and the cervix prepped with betadine.     A single tooth tenaculum was applied to the anterior lip of the cervix for stabilization.  Os finder as well as small dilator was used, however unable to gain access into uterine cavity.  Procedure was unsuccessful   Condition: Stable  Complications: None   Assessment & Plan:  1) Thickened endometrium, cervical stenosis, atrophic changes - Unable to complete endometrial biopsy due to cervical stenosis - Reviewed alternative options including proceeding with hysteroscopy D&C.  Discussed risk benefit including but not limited to risk of bleeding, infection, injury.  Due to small atrophic uterus discussed concern for uterine perforation requiring further surgical  intervention. - Based on clinical history would consider conservative management.  Should she note another episode of bleeding, would recommend proceeding with surgical cases outlined above - Plan for repeat ultrasound in 6 months, if lining appears thickened, would then proceed with hysteroscopy - Questions and concerns were addressed, patient agreeable to above   No orders of the defined types were placed in this encounter.   No follow-ups on file.   Anjelina Dung, DO Attending Obstetrician & Gynecologist, [facility] for [employer], [facility]

## 2024-02-04 ENCOUNTER — Encounter: Payer: Self-pay | Admitting: Emergency Medicine

## 2024-02-04 ENCOUNTER — Ambulatory Visit
Admission: EM | Admit: 2024-02-04 | Discharge: 2024-02-04 | Disposition: A | Discharge status: Home / Self Care | Attending: Family Medicine | Admitting: Family Medicine

## 2024-02-04 ENCOUNTER — Other Ambulatory Visit: Payer: Self-pay

## 2024-02-04 DIAGNOSIS — S29019A Strain of muscle and tendon of unspecified wall of thorax, initial encounter: Secondary | ICD-10-CM | POA: Diagnosis not present

## 2024-02-04 MED ORDER — TIZANIDINE HCL 4 MG PO CAPS: 4.0000 mg | ORAL_CAPSULE | Freq: Three times a day (TID) | ORAL | 0 refills | Status: AC | PRN

## 2024-02-04 MED ORDER — PREDNISONE 20 MG PO TABS: 40.0000 mg | ORAL_TABLET | Freq: Every day | ORAL | 0 refills | Status: AC

## 2024-02-04 NOTE — ED Provider Notes (Signed)
 [facility]    CSN: [id number] Arrival date & time: 02/04/24  1451      History   Chief Complaint Chief Complaint  Patient presents with   Back Pain    HPI [name] is a 53 y.o. female.   Patient presenting today with 2-week history of right sided upper back pain worse with movement.  Denies radiation of pain down extremities, weakness, numbness, tingling, bowel or bladder incontinence, saddle anesthesias, fevers, chills, abdominal pain, vomiting, diarrhea, urinary symptoms, known injury to the area.  So far trying Tylenol  and oxycodone  with minimal relief.    Past Medical History:  Diagnosis Date   Anxiety    Arthritis    Balance problems    Chronic abdominal pain    Chronic knee pain    Constipation    Difficulty walking    GERD (gastroesophageal reflux disease)    Headache    HTN (hypertension)    Low back pain    Panic attacks    Polyuria    Seizures (HCC)    unknown etiology; on dilantin ; last seizures was a few years ago.   Sleep apnea     Patient Active Problem List   Diagnosis Date Noted   Thickened endometrium 01/16/2024   PMB (postmenopausal bleeding) 12/08/2023   Encounter for well woman exam with routine gynecological exam 12/08/2023   Sleep disturbance 10/16/2023   Night sweats 10/16/2023   Hot flashes 10/16/2023   Abnormal renal function 08/31/2023   Bipolar disorder (HCC) 03/13/2023   Cramps of lower extremity 03/01/2023   Restless legs 01/12/2023   Hot flash not due to menopause 12/01/2022   Actinic keratosis 08/31/2022   Hyperkalemia 03/02/2022   Major depressive disorder 03/02/2022   Mass of left breast 02/16/2022   Acute conjunctivitis of left eye 01/12/2022   Pain of right thumb 11/03/2021   Headache 10/07/2021   Chest pain 10/07/2021   Prolapsed internal hemorrhoids, grade 2 09/02/2021   Fecal occult blood test positive 08/20/2021   Encounter for screening fecal occult blood testing 08/20/2021   Encounter  for gynecological examination with Papanicolaou smear of cervix 08/20/2021   Hormone replacement therapy (HRT) 08/20/2021   Hemorrhoids 03/24/2021   Chronic pain 03/17/2021   Lumbar radiculopathy 10/02/2020   Lumbar pain 09/29/2020   Essential hypertension 08/27/2020   Rectal bleeding 08/12/2019   S/P right knee arthroscopy *with microfracture 09/13/18 09/18/2018   Acute medial meniscus tear of right knee    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    Derangement of posterior horn of medial meniscus of right knee    Primary osteoarthritis of right knee    Gastric polyp    Gastroesophageal reflux disease 08/24/2015   Hypoactive sexual desire disorder 05/14/2014   Abdominal pain, chronic, epigastric 12/08/2013   Constipation 12/08/2013   Esophageal dysphagia 02/22/2013   Abdominal pain, epigastric 02/22/2013   LUQ pain 02/22/2013   Difficulty walking 01/25/2012   Stiffness of joint, not elsewhere classified, ankle and foot 01/25/2012   Balance problems 01/25/2012   Ankle sprain 01/17/2012   Ankle pain, right 01/17/2012    Past Surgical History:  Procedure Laterality Date   BACK SURGERY     complicated by ureteral injury   CHONDROPLASTY Right 08/31/2016   Procedure: CHONDROPLASTY RIGHT FEMUR;  Surgeon: Margrette Taft BRAVO, MD;  Location: [facility];  Service: Orthopedics;  Laterality: Right;   COLONOSCOPY WITH PROPOFOL  N/A 12/19/2019   Procedure: COLONOSCOPY WITH PROPOFOL ;  Surgeon: Shaaron Lamar HERO,  Nonbleeding external and internal grade 3 hemorrhoids, three 2-70mm polyps, otherwise normal.  Suspected benign anorectal bleeding.  Pathology revealed hyperplastic polyps.  Repeat colonoscopy in 10 years.   ESOPHAGOGASTRODUODENOSCOPY N/A 09/11/2015   Procedure: ESOPHAGOGASTRODUODENOSCOPY (EGD);  Surgeon: Lamar CHRISTELLA Hollingshead, MD;  Normal esophagus s/p dilation, few gastric polyps resected and retrieved, normal duodenum. Gastric polyps benign.    ESOPHAGOGASTRODUODENOSCOPY (EGD) WITH ESOPHAGEAL  DILATION N/A 03/20/2013   Dr. Rourk:Normal EGD-status post passage of a Maloney dilator/Status post esophageal biopsy., benign path   ESOPHAGOGASTRODUODENOSCOPY (EGD) WITH PROPOFOL  N/A 12/19/2019   Procedure: ESOPHAGOGASTRODUODENOSCOPY (EGD) WITH PROPOFOL ;  Surgeon: Hollingshead Lamar CHRISTELLA, MD;  Normal esophagus s/p dilation, benign-appearing fundic gland polyps, normal examined duodenum.   KNEE ARTHROSCOPY WITH MEDIAL MENISECTOMY Right 08/31/2016   Procedure: KNEE ARTHROSCOPY WITH MEDIAL MENISECTOMY;  Surgeon: Margrette Taft BRAVO, MD;  Location: [facility];  Service: Orthopedics;  Laterality: Right;   KNEE ARTHROSCOPY WITH MEDIAL MENISECTOMY Right 09/13/2018   Procedure: KNEE ARTHROSCOPY WITH MEDIAL MENISCECTOMY AND MICROFRACTURE;  Surgeon: Margrette Taft BRAVO, MD;  Location: [facility];  Service: Orthopedics;  Laterality: Right;   LUMBAR LAMINECTOMY/DECOMPRESSION MICRODISCECTOMY Left 02/12/2021   Procedure: Left Lumbar Five- Sacral One Microdiscectomy;  Surgeon: Joshua Alm RAMAN, MD;  Location: [facility] OR;  Service: Neurosurgery;  Laterality: Left;  Left Lumbar Five- Sacral One Microdiscectomy   MALONEY DILATION N/A 09/11/2015   Procedure: AGAPITO DILATION;  Surgeon: Lamar CHRISTELLA Hollingshead, MD;  [facility];  Service: Endoscopy;  Laterality: N/A;   MALONEY DILATION N/A 12/19/2019   Procedure: AGAPITO DILATION;  Surgeon: Hollingshead Lamar CHRISTELLA, MD;  [facility];  Service: Endoscopy;  Laterality: N/A;   POLYPECTOMY  12/19/2019   Procedure: POLYPECTOMY;  Surgeon: Hollingshead Lamar CHRISTELLA, MD;  [facility];  Service: Endoscopy;;    OB History     Gravida  1   Para      Term      Preterm      AB  1   Living         SAB  1   IAB      Ectopic      Multiple      Live Births               Home Medications    Prior to Admission medications   Medication Sig Start Date End Date Taking? Authorizing Provider  predniSONE  (DELTASONE ) 20 MG tablet Take 2 tablets (40 mg total) by mouth daily with  breakfast. 02/04/24  Yes Stuart Vernell Norris, PA-C  tiZANidine  (ZANAFLEX ) 4 MG capsule Take 1 capsule (4 mg total) by mouth 3 (three) times daily as needed for muscle spasms. Do not drink alcohol or drive while taking this medication.  May cause drowsiness. 02/04/24  Yes Stuart Vernell Norris, PA-C  ALPRAZolam (XANAX) 0.5 MG tablet Take 0.5 mg by mouth 2 (two) times daily as needed for anxiety.  04/30/14   [provider]  amLODipine  (NORVASC ) 5 MG tablet SMARTSIG:1 Tablet(s) By Mouth Every Evening 07/15/21   [provider]  celecoxib (CELEBREX) 200 MG capsule Take 1 capsule every day by oral route. 11/30/23   [provider]  cetirizine (ZYRTEC) 10 MG tablet Take 1 tablet (10 mg total) by mouth daily. 01/18/24   Leath-Warren, Etta PARAS, NP  cyanocobalamin  (VITAMIN B12) 1000 MCG tablet Take 1 tablet (1,000 mcg total) by mouth daily. 07/19/23   Camara, Amadou, MD  DULoxetine  (CYMBALTA ) 30 MG capsule Take 30 mg by mouth daily.  08/21/19   [provider]  estradiol  (ESTRACE ) 1 MG tablet Take 1 tablet (1 mg total) by mouth daily. 12/08/23   Signa Delon LABOR, NP  ezetimibe  (ZETIA ) 10 MG tablet Take 10 mg by mouth daily. 02/20/20   [provider]  fluticasone  (FLONASE ) 50 MCG/ACT nasal spray Place 1 spray into both nostrils 2 (two) times daily. 06/11/23   Stuart Vernell Norris, PA-C  irbesartan  (AVAPRO ) 75 MG tablet Take 300 mg by mouth daily at 2 PM. 07/03/14   [provider]  lamoTRIgine  (LAMICTAL ) 100 MG tablet Take 1 tablet (100 mg total) by mouth 2 (two) times daily. 01/31/24 01/25/25  Gregg Lek, MD  LINZESS  145 MCG CAPS capsule TAKE ONE CAPSULE BY MOUTH BEFORE BREAKFAST 12/31/21   Rudy Josette RAMAN, PA-C  metoprolol  succinate (TOPROL -XL) 25 MG 24 hr tablet Take 25 mg by mouth daily. 09/08/20   [provider]  NEXLIZET 180-10 MG TABS Take 1 tablet by mouth daily.    [provider]  ondansetron  (ZOFRAN ) 4 MG tablet Take 1  tablet (4 mg total) by mouth every 6 (six) hours. 11/10/19   Wurst, Brittany, PA-C  oxyCODONE -acetaminophen  (PERCOCET/ROXICET) 5-325 MG tablet Take 1 tablet by mouth every 4 (four) hours as needed for severe pain (pain.). 02/12/21   Joshua Alm Hamilton, MD  pantoprazole  (PROTONIX ) 40 MG tablet Take 1 tablet (40 mg total) by mouth 2 (two) times daily. 03/19/20   Rudy Josette RAMAN, PA-C  progesterone  (PROMETRIUM ) 200 MG capsule Take 1 capsule (200 mg total) by mouth daily. At bedtime 12/08/23   Signa Delon LABOR, NP  Vibegron  (GEMTESA ) 75 MG TABS Gemtesa  75 mg tablet  Take 1 tablet every day by oral route.    [provider]    Family History Family History  Problem Relation Age of Onset   Throat cancer Father    Heart disease Father    Cancer Father    Hypertension Father    Diabetes Father    Other Mother        esophageal stricture and gallbladder disease   Thyroid  disease Mother    Other Sister        esophageal stricture and gallbladder problems.    Diabetes Sister    Hypertension Sister    Thyroid  disease Sister    Heart disease Maternal Grandfather    Diabetes Maternal Grandfather    Arthritis Maternal Grandfather    Colon cancer Neg Hx    Liver disease Neg Hx    Pancreatic cancer Neg Hx     Social History Social History   Tobacco Use   Smoking status: Former    Current packs/day: 0.00    Average packs/day: 2.0 packs/day for 9.0 years (18.0 ttl pk-yrs)    Types: Cigarettes    Start date: 04/04/1985    Quit date: 04/04/1994    Years since quitting: 29.8   Smokeless tobacco: Never   Tobacco comments:    smoked 8 years, quit 18 years ago in the 1996  Vaping Use   Vaping status: Never Used  Substance Use Topics   Alcohol use: No    Alcohol/week: 0.0 standard drinks of alcohol   Drug use: No     Allergies   Benadryl [diphenhydramine hcl]   Review of Systems Review of Systems Per HPI  Physical Exam Triage Vital Signs ED Triage Vitals  Encounter  Vitals Group     BP 02/04/24 1514 117/82     Girls Systolic BP Percentile --  Girls Diastolic BP Percentile --      Boys Systolic BP Percentile --      Boys Diastolic BP Percentile --      Pulse Rate 02/04/24 1514 96     Resp 02/04/24 1514 20     Temp 02/04/24 1514 98.8 F (37.1 C)     Temp Source 02/04/24 1514 Oral     SpO2 02/04/24 1514 96 %     Weight --      Height --      Head Circumference --      Peak Flow --      Pain Score 02/04/24 1513 9     Pain Loc --      Pain Education --      Exclude from Growth Chart --    No data found.  Updated Vital Signs BP 117/82 (BP Location: Right Arm)   Pulse 96   Temp 98.8 F (37.1 C) (Oral)   Resp 20   LMP 07/06/2018   SpO2 96%   Visual Acuity Right Eye Distance:   Left Eye Distance:   Bilateral Distance:    Right Eye Near:   Left Eye Near:    Bilateral Near:     Physical Exam Vitals and nursing note reviewed.  Constitutional:      Appearance: Normal appearance. She is not ill-appearing.  HENT:     Head: Atraumatic.  Eyes:     Extraocular Movements: Extraocular movements intact.     Conjunctiva/sclera: Conjunctivae normal.  Cardiovascular:     Rate and Rhythm: Normal rate.  Pulmonary:     Effort: Pulmonary effort is normal.  Musculoskeletal:        General: Tenderness present. No swelling. Normal range of motion.     Cervical back: Normal range of motion and neck supple.     Comments: Localized area of tenderness to palpation to the right thoracic musculature.  No midline spinal tenderness to palpation diffusely.  Normal gait and range of motion.  Grip strength full and equal bilateral hands  Skin:    General: Skin is warm and dry.     Findings: No bruising or erythema.  Neurological:     Mental Status: She is alert and oriented to person, place, and time.     Motor: No weakness.     Gait: Gait normal.     Comments: All 4 extremities neurovascularly intact  Psychiatric:        Mood and Affect: Mood  normal.        Thought Content: Thought content normal.        Judgment: Judgment normal.      UC Treatments / Results  Labs (all labs ordered are listed, but only abnormal results are displayed) Labs Reviewed - No data to display  EKG   Radiology No results found.  Procedures Procedures (including critical care time)  Medications Ordered in UC Medications - No data to display  Initial Impression / Assessment and Plan / UC Course  I have reviewed the triage vital signs and the nursing notes.  Pertinent labs & imaging results that were available during my care of the patient were reviewed by me and considered in my medical decision making (see chart for details).     Consistent with thoracic strain.  Treat with prednisone , Zanaflex , heat, soft, stretches, rest.  Return for worsening or unresolving symptoms.  Final Clinical Impressions(s) / UC Diagnoses   Final diagnoses:  Thoracic myofascial strain, initial encounter  Discharge Instructions      In addition to the prescribed medications, you may use heat, massage, stretches, core strengthening exercises, muscle rubs    ED Prescriptions     Medication Sig Dispense Auth. Provider   predniSONE  (DELTASONE ) 20 MG tablet Take 2 tablets (40 mg total) by mouth daily with breakfast. 10 tablet Stuart Vernell Norris, PA-C   tiZANidine  (ZANAFLEX ) 4 MG capsule Take 1 capsule (4 mg total) by mouth 3 (three) times daily as needed for muscle spasms. Do not drink alcohol or drive while taking this medication.  May cause drowsiness. 15 capsule Stuart Vernell Norris, [place]      PDMP not reviewed this encounter.   Stuart Vernell Norris, [place] 02/04/24 1557

## 2024-02-04 NOTE — Discharge Instructions (Signed)
 In addition to the prescribed medications, you may use heat, massage, stretches, core strengthening exercises, muscle rubs

## 2024-02-04 NOTE — ED Triage Notes (Addendum)
 Pt reports right sided mid back pain x2 weeks. Pt denies any known abd pain, nausea, injury, urinary problems. Reports has taken tylenol  with no change in discomfort.

## 2024-02-19 ENCOUNTER — Other Ambulatory Visit: Payer: Self-pay | Admitting: Adult Health

## 2024-02-27 ENCOUNTER — Encounter (HOSPITAL_COMMUNITY): Payer: Self-pay | Admitting: Family Medicine

## 2024-02-27 ENCOUNTER — Other Ambulatory Visit (HOSPITAL_COMMUNITY): Payer: Self-pay | Admitting: Family Medicine

## 2024-02-27 ENCOUNTER — Ambulatory Visit (HOSPITAL_COMMUNITY)
Admission: RE | Admit: 2024-02-27 | Discharge: 2024-02-27 | Disposition: A | Discharge status: Home / Self Care | Source: Ambulatory Visit | Attending: Family Medicine | Admitting: Family Medicine

## 2024-02-27 DIAGNOSIS — M545 Low back pain, unspecified: Secondary | ICD-10-CM | POA: Insufficient documentation

## 2024-02-27 DIAGNOSIS — M79601 Pain in right arm: Secondary | ICD-10-CM

## 2024-03-01 ENCOUNTER — Other Ambulatory Visit: Payer: Self-pay | Admitting: Neurology

## 2024-03-20 ENCOUNTER — Other Ambulatory Visit: Payer: Self-pay | Admitting: Family Medicine

## 2024-03-20 DIAGNOSIS — M25552 Pain in left hip: Secondary | ICD-10-CM

## 2024-03-26 ENCOUNTER — Ambulatory Visit
Admission: RE | Admit: 2024-03-26 | Discharge: 2024-03-26 | Disposition: A | Discharge status: Home / Self Care | Source: Ambulatory Visit | Attending: Family Medicine | Admitting: Family Medicine

## 2024-03-26 DIAGNOSIS — M25552 Pain in left hip: Secondary | ICD-10-CM

## 2024-04-01 ENCOUNTER — Ambulatory Visit: Admission: EM | Admit: 2024-04-01 | Discharge: 2024-04-01 | Discharge status: Against Medical Advice

## 2024-04-20 ENCOUNTER — Emergency Department (HOSPITAL_COMMUNITY)
Admission: EM | Admit: 2024-04-20 | Discharge: 2024-04-20 | Disposition: A | Discharge status: Home / Self Care | Attending: Emergency Medicine | Admitting: Emergency Medicine

## 2024-04-20 ENCOUNTER — Encounter (HOSPITAL_COMMUNITY): Payer: Self-pay

## 2024-04-20 ENCOUNTER — Emergency Department (HOSPITAL_COMMUNITY)

## 2024-04-20 ENCOUNTER — Other Ambulatory Visit: Payer: Self-pay

## 2024-04-20 DIAGNOSIS — M545 Low back pain, unspecified: Secondary | ICD-10-CM | POA: Insufficient documentation

## 2024-04-20 DIAGNOSIS — I10 Essential (primary) hypertension: Secondary | ICD-10-CM | POA: Insufficient documentation

## 2024-04-20 DIAGNOSIS — M546 Pain in thoracic spine: Secondary | ICD-10-CM | POA: Insufficient documentation

## 2024-04-20 DIAGNOSIS — R10A1 Flank pain, right side: Secondary | ICD-10-CM | POA: Diagnosis present

## 2024-04-20 DIAGNOSIS — Z79899 Other long term (current) drug therapy: Secondary | ICD-10-CM | POA: Diagnosis not present

## 2024-04-20 LAB — COMPREHENSIVE METABOLIC PANEL WITH GFR
ALT: 20 U/L (ref 0–44)
AST: 25 U/L (ref 15–41)
Albumin: 4.4 g/dL (ref 3.5–5.0)
Alkaline Phosphatase: 81 U/L (ref 38–126)
Anion gap: 13 (ref 5–15)
BUN: 13 mg/dL (ref 6–20)
CO2: 27 mmol/L (ref 22–32)
Calcium: 9.5 mg/dL (ref 8.9–10.3)
Chloride: 97 mmol/L — ABNORMAL LOW (ref 98–111)
Creatinine, Ser: 1 mg/dL (ref 0.44–1.00)
GFR, Estimated: >60 mL/min
Glucose, Bld: 82 mg/dL (ref 70–99)
Potassium: 3.8 mmol/L (ref 3.5–5.1)
Sodium: 137 mmol/L (ref 135–145)
Total Bilirubin: 0.4 mg/dL (ref 0.0–1.2)
Total Protein: 7.3 g/dL (ref 6.5–8.1)

## 2024-04-20 LAB — URINALYSIS, ROUTINE W REFLEX MICROSCOPIC
Bilirubin Urine: NEGATIVE
Glucose, UA: NEGATIVE mg/dL
Ketones, ur: NEGATIVE mg/dL
Leukocytes,Ua: NEGATIVE
Nitrite: NEGATIVE
Protein, ur: NEGATIVE mg/dL
Specific Gravity, Urine: 1.006 (ref 1.005–1.030)
pH: 7 (ref 5.0–8.0)

## 2024-04-20 LAB — CBC WITH DIFFERENTIAL/PLATELET
Abs Immature Granulocytes: 0.02 K/uL (ref 0.00–0.07)
Basophils Absolute: 0 K/uL (ref 0.0–0.1)
Basophils Relative: 1 %
Eosinophils Absolute: 0.1 K/uL (ref 0.0–0.5)
Eosinophils Relative: 1 %
HCT: 40.9 % (ref 36.0–46.0)
Hemoglobin: 14 g/dL (ref 12.0–15.0)
Immature Granulocytes: 0 %
Lymphocytes Relative: 33 %
Lymphs Abs: 2 K/uL (ref 0.7–4.0)
MCH: 30.6 pg (ref 26.0–34.0)
MCHC: 34.2 g/dL (ref 30.0–36.0)
MCV: 89.5 fL (ref 80.0–100.0)
Monocytes Absolute: 0.7 K/uL (ref 0.1–1.0)
Monocytes Relative: 11 %
Neutro Abs: 3.2 K/uL (ref 1.7–7.7)
Neutrophils Relative %: 54 %
Platelets: 325 K/uL (ref 150–400)
RBC: 4.57 MIL/uL (ref 3.87–5.11)
RDW: 12.5 % (ref 11.5–15.5)
WBC: 5.9 K/uL (ref 4.0–10.5)
nRBC: 0 % (ref 0.0–0.2)

## 2024-04-20 LAB — LIPASE, BLOOD: Lipase: 29 U/L (ref 11–51)

## 2024-04-20 MED ORDER — KETOROLAC TROMETHAMINE 15 MG/ML IJ SOLN
15.0000 mg | Freq: Once | INTRAMUSCULAR | Status: AC
  Administered 2024-04-20: 15 mg via INTRAVENOUS
  Filled 2024-04-20: qty 1

## 2024-04-20 NOTE — Discharge Instructions (Signed)
 You were evaluated in the emergency room for back pain.  There is no evidence of a kidney stone or kidney infection.  Please follow-up with the orthopedic doctor as scheduled.  If you experience any new or worsening symptoms please return to the emergency room.

## 2024-04-20 NOTE — ED Provider Notes (Signed)
 " [place] EMERGENCY DEPARTMENT AT [facility] ([facility]) Provider Note   CSN: [id number] Arrival date & time: 04/20/24  1714     Patient presents with: Flank Pain (Right side) and Nausea   [name] is a 54 y.o. female patient evaluated for atraumatic right flank pain for the past couple weeks.  Denies any radicular symptoms.  Pain is intermittent.  When it does occur does not appear to be correlated with movement.  Does recall history of spinal surgeries.  No urinary or fecal incontinence reported.  No dysuria or increased frequency.  No relief with over-the-counter medications, prednisone  or Zanaflex  from primary.    Flank Pain   Past Medical History:  Diagnosis Date   Anxiety    Arthritis    Balance problems    Chronic abdominal pain    Chronic knee pain    Constipation    Difficulty walking    GERD (gastroesophageal reflux disease)    Headache    HTN (hypertension)    Low back pain    Panic attacks    Polyuria    Seizures (HCC)    unknown etiology; on dilantin ; last seizures was a few years ago.   Sleep apnea    Past Surgical History:  Procedure Laterality Date   BACK SURGERY     complicated by ureteral injury   CHONDROPLASTY Right 08/31/2016   Procedure: CHONDROPLASTY RIGHT FEMUR;  Surgeon: Margrette Taft BRAVO, MD;  Location: [facility];  Service: Orthopedics;  Laterality: Right;   COLONOSCOPY WITH PROPOFOL  N/A 12/19/2019   Procedure: COLONOSCOPY WITH PROPOFOL ;  Surgeon: Shaaron Lamar HERO, Nonbleeding external and internal grade 3 hemorrhoids, three 2-40mm polyps, otherwise normal.  Suspected benign anorectal bleeding.  Pathology revealed hyperplastic polyps.  Repeat colonoscopy in 10 years.   ESOPHAGOGASTRODUODENOSCOPY N/A 09/11/2015   Procedure: ESOPHAGOGASTRODUODENOSCOPY (EGD);  Surgeon: Lamar HERO Shaaron, MD;  Normal esophagus s/p dilation, few gastric polyps resected and retrieved, normal duodenum. Gastric polyps benign.    ESOPHAGOGASTRODUODENOSCOPY (EGD) WITH  ESOPHAGEAL DILATION N/A 03/20/2013   Dr. Rourk:Normal EGD-status post passage of a Maloney dilator/Status post esophageal biopsy., benign path   ESOPHAGOGASTRODUODENOSCOPY (EGD) WITH PROPOFOL  N/A 12/19/2019   Procedure: ESOPHAGOGASTRODUODENOSCOPY (EGD) WITH PROPOFOL ;  Surgeon: Shaaron Lamar HERO, MD;  Normal esophagus s/p dilation, benign-appearing fundic gland polyps, normal examined duodenum.   KNEE ARTHROSCOPY WITH MEDIAL MENISECTOMY Right 08/31/2016   Procedure: KNEE ARTHROSCOPY WITH MEDIAL MENISECTOMY;  Surgeon: Margrette Taft BRAVO, MD;  Location: [facility];  Service: Orthopedics;  Laterality: Right;   KNEE ARTHROSCOPY WITH MEDIAL MENISECTOMY Right 09/13/2018   Procedure: KNEE ARTHROSCOPY WITH MEDIAL MENISCECTOMY AND MICROFRACTURE;  Surgeon: Margrette Taft BRAVO, MD;  Location: [facility];  Service: Orthopedics;  Laterality: Right;   LUMBAR LAMINECTOMY/DECOMPRESSION MICRODISCECTOMY Left 02/12/2021   Procedure: Left Lumbar Five- Sacral One Microdiscectomy;  Surgeon: Joshua Alm RAMAN, MD;  Location: [facility] OR;  Service: Neurosurgery;  Laterality: Left;  Left Lumbar Five- Sacral One Microdiscectomy   MALONEY DILATION N/A 09/11/2015   Procedure: AGAPITO DILATION;  Surgeon: Lamar HERO Shaaron, MD;  Location: [facility];  Service: Endoscopy;  Laterality: N/A;   MALONEY DILATION N/A 12/19/2019   Procedure: AGAPITO DILATION;  Surgeon: Shaaron Lamar HERO, MD;  Location: [facility];  Service: Endoscopy;  Laterality: N/A;   POLYPECTOMY  12/19/2019   Procedure: POLYPECTOMY;  Surgeon: Shaaron Lamar HERO, MD;  Location: [facility];  Service: Endoscopy;;        Prior to Admission medications  Medication Sig Start Date End  Date Taking? Authorizing Provider  ALPRAZolam (XANAX) 0.5 MG tablet Take 0.5 mg by mouth 2 (two) times daily as needed for anxiety.  04/30/14   [provider]  amLODipine  (NORVASC ) 5 MG tablet SMARTSIG:1 Tablet(s) By Mouth Every Evening 07/15/21   [provider]  celecoxib (CELEBREX) 200 MG  capsule Take 1 capsule every day by oral route. 11/30/23   [provider]  cetirizine  (ZYRTEC ) 10 MG tablet Take 1 tablet (10 mg total) by mouth daily. 01/18/24   Leath-Warren, Etta PARAS, NP  cyanocobalamin  (VITAMIN B12) 1000 MCG tablet Take 1 tablet (1,000 mcg total) by mouth daily. 07/19/23   Camara, Amadou, MD  DULoxetine  (CYMBALTA ) 30 MG capsule Take 30 mg by mouth daily. 08/21/19   [provider]  estradiol  (ESTRACE ) 1 MG tablet TAKE 1 TABLET(1 MG) BY MOUTH DAILY 02/19/24   Signa Nest A, NP  ezetimibe  (ZETIA ) 10 MG tablet Take 10 mg by mouth daily. 02/20/20   [provider]  fluticasone  (FLONASE ) 50 MCG/ACT nasal spray Place 1 spray into both nostrils 2 (two) times daily. 06/11/23   Stuart Vernell Norris, PA-C  irbesartan  (AVAPRO ) 75 MG tablet Take 300 mg by mouth daily at 2 PM. 07/03/14   [provider]  lamoTRIgine  (LAMICTAL ) 100 MG tablet Take 1 tablet (100 mg total) by mouth 2 (two) times daily. 01/31/24 01/25/25  Gregg Lek, MD  LINZESS  145 MCG CAPS capsule TAKE ONE CAPSULE BY MOUTH BEFORE BREAKFAST 12/31/21   Rudy Josette RAMAN, PA-C  metoprolol  succinate (TOPROL -XL) 25 MG 24 hr tablet Take 25 mg by mouth daily. 09/08/20   [provider]  NEXLIZET 180-10 MG TABS Take 1 tablet by mouth daily.    [provider]  ondansetron  (ZOFRAN ) 4 MG tablet Take 1 tablet (4 mg total) by mouth every 6 (six) hours. 11/10/19   Wurst, Brittany, PA-C  oxyCODONE -acetaminophen  (PERCOCET/ROXICET) 5-325 MG tablet Take 1 tablet by mouth every 4 (four) hours as needed for severe pain (pain.). 02/12/21   Joshua Alm Hamilton, MD  pantoprazole  (PROTONIX ) 40 MG tablet Take 1 tablet (40 mg total) by mouth 2 (two) times daily. 03/19/20   Rudy Josette RAMAN, PA-C  predniSONE  (DELTASONE ) 20 MG tablet Take 2 tablets (40 mg total) by mouth daily with breakfast. 02/04/24   Stuart Vernell Norris, PA-C  progesterone  (PROMETRIUM ) 200 MG capsule Take 1 capsule (200 mg  total) by mouth daily. At bedtime 12/08/23   Signa Nest LABOR, NP  tiZANidine  (ZANAFLEX ) 4 MG capsule Take 1 capsule (4 mg total) by mouth 3 (three) times daily as needed for muscle spasms. Do not drink alcohol or drive while taking this medication.  May cause drowsiness. 02/04/24   Stuart Vernell Norris, PA-C  Vibegron  (GEMTESA ) 75 MG TABS Gemtesa  75 mg tablet  Take 1 tablet every day by oral route.    [provider]    Allergies: Benadryl [diphenhydramine hcl]    Review of Systems  Genitourinary:  Positive for flank pain.    Updated Vital Signs BP (!) 153/89 (BP Location: Right Arm)   Pulse 94   Temp 98.6 F (37 C) (Oral)   Resp 18   LMP 07/06/2018   SpO2 98%   Physical Exam Vitals and nursing note reviewed.  Constitutional:      General: She is not in acute distress.    Appearance: She is well-developed.  HENT:     Head: Normocephalic and atraumatic.  Eyes:     Conjunctiva/sclera: Conjunctivae normal.  Cardiovascular:  Rate and Rhythm: Normal rate and regular rhythm.     Heart sounds: No murmur heard. Pulmonary:     Effort: Pulmonary effort is normal. No respiratory distress.     Breath sounds: Normal breath sounds.  Abdominal:     Palpations: Abdomen is soft.     Tenderness: There is no abdominal tenderness. There is right CVA tenderness.  Musculoskeletal:        General: No swelling.     Cervical back: Neck supple.     Comments: No midline spinal tenderness.  Tenderness to right thoracic paraspinal region, 5 out of 5 lower extremity strength, sensation intact  Skin:    General: Skin is warm and dry.     Capillary Refill: Capillary refill takes less than 2 seconds.  Neurological:     Mental Status: She is alert.  Psychiatric:        Mood and Affect: Mood normal.     (all labs ordered are listed, but only abnormal results are displayed) Labs Reviewed  URINALYSIS, ROUTINE W REFLEX MICROSCOPIC - Abnormal; Notable for the following components:       Result Value   Hgb urine dipstick SMALL (*)    Bacteria, UA RARE (*)    All other components within normal limits  COMPREHENSIVE METABOLIC PANEL WITH GFR - Abnormal; Notable for the following components:   Chloride 97 (*)    All other components within normal limits  CBC WITH DIFFERENTIAL/PLATELET  LIPASE, BLOOD    EKG: None  Radiology: CT RENAL STONE STUDY Result Date: 04/20/2024 EXAM: CT ABDOMEN AND PELVIS WITHOUT CONTRAST 04/20/2024 06:43:58 PM TECHNIQUE: CT of the abdomen and pelvis was performed without the administration of intravenous contrast. Multiplanar reformatted images are provided for review. Automated exposure control, iterative reconstruction, and/or weight-based adjustment of the mA/kV was utilized to reduce the radiation dose to as low as reasonably achievable. COMPARISON: CT abdomen and pelvis 12/26/2017. CLINICAL HISTORY: Abdominal/flank pain, stone suspected. FINDINGS: LOWER CHEST: Calcified granuloma in the left lower lobe. Coronary artery calcifications are noted. LIVER: The liver is unremarkable. GALLBLADDER AND BILE DUCTS: Gallbladder is unremarkable. No biliary ductal dilatation. SPLEEN: No acute abnormality. PANCREAS: No acute abnormality. ADRENAL GLANDS: No acute abnormality. KIDNEYS, URETERS AND BLADDER: No stones in the kidneys or ureters. No hydronephrosis. No perinephric or periureteral stranding. Urinary bladder is unremarkable. GI AND BOWEL: Stomach demonstrates no acute abnormality. The appendix is normal. Duodenal diverticulum present. There is no bowel obstruction. PERITONEUM AND RETROPERITONEUM: No ascites. No free air. VASCULATURE: Aorta is normal in caliber. LYMPH NODES: No lymphadenopathy. REPRODUCTIVE ORGANS: No acute abnormality. BONES AND SOFT TISSUES: Disc spacers noted at L4-L5. No acute osseous abnormality. No focal soft tissue abnormality. IMPRESSION: 1. No acute findings in the abdomen or pelvis. 2. Coronary artery calcifications. Electronically  signed by: Greig Pique MD 04/20/2024 06:56 PM EST RP Workstation: [id number]     Procedures   Medications Ordered in the ED  ketorolac  (TORADOL ) 15 MG/ML injection 15 mg (15 mg Intravenous Given 04/20/24 1949)    Clinical Course as of 04/20/24 2049  Sat Apr 20, 2024  1834 Patient evaluated for atraumatic right flank pain over the past few weeks.  No red flag symptoms.  No urinary symptoms.  Upon arrival patient is hemodynamically stable and nontoxic-appearing.  Has right CVAT.  Abdomen nontender.  Urine with small amount of hemoglobin.  Will obtain CT renal study [JT]  1956 CT RENAL STONE STUDY No acute findings [JT]  2013 CBC with Differential Unremarkable [JT]  2047 Comprehensive metabolic panel(!) No significant normality  [JT]  2048 Lipase, blood within normal [JT]  2048 Workup is overall reassuring.  Patient will be discharged home.  Has a upcoming appointment with her orthopedic doctor.  She has hydrocodone  at home.  Recently was on a course of prednisone  and has muscle relaxers as well.  She declines interest in lidocaine  patches.  Ultimately she is relieved that there is no kidney stone.  Patient discharged home.  Agreeable to plan. [JT]    Clinical Course User Index [JT] Donnajean Lynwood DEL, PA-C                                 Medical Decision Making Amount and/or Complexity of Data Reviewed Labs: ordered. Decision-making details documented in ED Course. Radiology: ordered. Decision-making details documented in ED Course.  Risk Prescription drug management.   This patient presents to the ED with chief complaint(s) of Back pain .  The complaint involves an extensive differential diagnosis and also carries with it a high risk of complications and morbidity.   Pertinent past medical history as listed in HPI  The differential diagnosis includes  Nephrolithiasis, pyelonephritis, back strain, cauda equina, spinal abscess, fracture, discitis Additional history  obtained: Records reviewed Care Everywhere/External Records  Disposition:   Patient will be discharged home. The patient has been appropriately medically screened and/or stabilized in the ED. I have low suspicion for any other emergent medical condition which would require further screening, evaluation or treatment in the ED or require inpatient management. At time of discharge the patient is hemodynamically stable and in no acute distress. I have discussed work-up results and diagnosis with patient and answered all questions. Patient is agreeable with discharge plan. We discussed strict return precautions for returning to the emergency department and they verbalized understanding.     Social Determinants of Health:   none  This note was dictated with voice recognition software.  Despite best efforts at proofreading, errors may have occurred which can change the documentation meaning.       Final diagnoses:  Acute right-sided low back pain without sciatica    ED Discharge Orders     None          Donnajean Lynwood DEL, PA-C 04/20/24 2049  "

## 2024-04-20 NOTE — ED Notes (Signed)
 Pt returned from CT

## 2024-04-20 NOTE — ED Triage Notes (Signed)
 Pt comes in for right flank pain. Pt also has less urinary output and nausea. 9/10 on pain scale. No hx of kidney stones or UTI. S/s started 3-4 weeks.   Pt took 800mg  motrin  this morning. Pt took percocet around 1200 with no relief.

## 2024-04-24 ENCOUNTER — Other Ambulatory Visit

## 2024-04-24 ENCOUNTER — Other Ambulatory Visit: Payer: Self-pay | Admitting: Student

## 2024-04-24 DIAGNOSIS — M5417 Radiculopathy, lumbosacral region: Secondary | ICD-10-CM

## 2024-05-17 ENCOUNTER — Other Ambulatory Visit

## 2025-01-30 ENCOUNTER — Ambulatory Visit: Admitting: Neurology
